# Patient Record
Sex: Female | Born: 1965 | Race: White | Hispanic: No | Marital: Married | Smoking: Never smoker
Health system: Southern US, Community
[De-identification: ages and names within clinical notes are randomized; demographics above are authoritative.]

## PROBLEM LIST (undated history)

## (undated) DIAGNOSIS — E8881 Metabolic syndrome: Secondary | ICD-10-CM

## (undated) DIAGNOSIS — Z9889 Other specified postprocedural states: Secondary | ICD-10-CM

## (undated) DIAGNOSIS — G4730 Sleep apnea, unspecified: Secondary | ICD-10-CM

## (undated) DIAGNOSIS — R112 Nausea with vomiting, unspecified: Secondary | ICD-10-CM

## (undated) DIAGNOSIS — M549 Dorsalgia, unspecified: Secondary | ICD-10-CM

## (undated) DIAGNOSIS — M25561 Pain in right knee: Secondary | ICD-10-CM

## (undated) DIAGNOSIS — M722 Plantar fascial fibromatosis: Secondary | ICD-10-CM

## (undated) DIAGNOSIS — F419 Anxiety disorder, unspecified: Secondary | ICD-10-CM

## (undated) DIAGNOSIS — J329 Chronic sinusitis, unspecified: Secondary | ICD-10-CM

## (undated) DIAGNOSIS — M2550 Pain in unspecified joint: Secondary | ICD-10-CM

## (undated) DIAGNOSIS — K219 Gastro-esophageal reflux disease without esophagitis: Secondary | ICD-10-CM

## (undated) DIAGNOSIS — E88819 Insulin resistance, unspecified: Secondary | ICD-10-CM

## (undated) DIAGNOSIS — E559 Vitamin D deficiency, unspecified: Secondary | ICD-10-CM

## (undated) DIAGNOSIS — T7840XA Allergy, unspecified, initial encounter: Secondary | ICD-10-CM

## (undated) DIAGNOSIS — D219 Benign neoplasm of connective and other soft tissue, unspecified: Secondary | ICD-10-CM

## (undated) DIAGNOSIS — R0602 Shortness of breath: Secondary | ICD-10-CM

## (undated) DIAGNOSIS — E785 Hyperlipidemia, unspecified: Secondary | ICD-10-CM

## (undated) HISTORY — DX: Allergy, unspecified, initial encounter: T78.40XA

## (undated) HISTORY — DX: Shortness of breath: R06.02

## (undated) HISTORY — DX: Pain in right knee: M25.561

## (undated) HISTORY — DX: Pain in unspecified joint: M25.50

## (undated) HISTORY — DX: Chronic sinusitis, unspecified: J32.9

## (undated) HISTORY — DX: Gastro-esophageal reflux disease without esophagitis: K21.9

## (undated) HISTORY — PX: GANGLION CYST EXCISION: SHX1691

## (undated) HISTORY — DX: Anxiety disorder, unspecified: F41.9

## (undated) HISTORY — DX: Hyperlipidemia, unspecified: E78.5

## (undated) HISTORY — DX: Sleep apnea, unspecified: G47.30

## (undated) HISTORY — PX: NASAL SINUS SURGERY: SHX719

## (undated) HISTORY — DX: Vitamin D deficiency, unspecified: E55.9

## (undated) HISTORY — PX: TMJ ARTHROSCOPY: SHX1067

## (undated) HISTORY — DX: Dorsalgia, unspecified: M54.9

## (undated) HISTORY — PX: NASAL SEPTUM SURGERY: SHX37

---

## 2000-02-18 ENCOUNTER — Other Ambulatory Visit: Admission: RE | Admit: 2000-02-18 | Discharge: 2000-02-18 | Payer: Self-pay | Admitting: Gynecology

## 2001-02-24 ENCOUNTER — Other Ambulatory Visit: Admission: RE | Admit: 2001-02-24 | Discharge: 2001-02-24 | Payer: Self-pay | Admitting: Gynecology

## 2001-03-29 ENCOUNTER — Emergency Department (HOSPITAL_COMMUNITY): Admission: EM | Admit: 2001-03-29 | Discharge: 2001-03-29 | Payer: Self-pay | Admitting: Emergency Medicine

## 2001-12-19 ENCOUNTER — Emergency Department (HOSPITAL_COMMUNITY): Admission: EM | Admit: 2001-12-19 | Discharge: 2001-12-19 | Payer: Self-pay

## 2002-02-25 ENCOUNTER — Other Ambulatory Visit: Admission: RE | Admit: 2002-02-25 | Discharge: 2002-02-25 | Payer: Self-pay | Admitting: Gynecology

## 2003-03-24 ENCOUNTER — Other Ambulatory Visit: Admission: RE | Admit: 2003-03-24 | Discharge: 2003-03-24 | Payer: Self-pay | Admitting: Gynecology

## 2004-04-30 ENCOUNTER — Other Ambulatory Visit: Admission: RE | Admit: 2004-04-30 | Discharge: 2004-04-30 | Payer: Self-pay | Admitting: Gynecology

## 2005-03-20 ENCOUNTER — Other Ambulatory Visit: Admission: RE | Admit: 2005-03-20 | Discharge: 2005-03-20 | Payer: Self-pay | Admitting: Gynecology

## 2006-12-22 ENCOUNTER — Other Ambulatory Visit: Admission: RE | Admit: 2006-12-22 | Discharge: 2006-12-22 | Payer: Self-pay | Admitting: Gynecology

## 2008-11-08 ENCOUNTER — Encounter: Payer: Self-pay | Admitting: Family Medicine

## 2008-11-09 ENCOUNTER — Encounter: Payer: Self-pay | Admitting: Family Medicine

## 2008-11-09 ENCOUNTER — Encounter (INDEPENDENT_AMBULATORY_CARE_PROVIDER_SITE_OTHER): Payer: Self-pay | Admitting: *Deleted

## 2008-11-09 LAB — CONVERTED CEMR LAB
ALT: 13 units/L
Alkaline Phosphatase: 75 units/L
CO2, serum: 19 mmol/L
Chloride, Serum: 105 mmol/L
Cholesterol: 151 mg/dL
Creatinine, Ser: 0.64 mg/dL
Free T4: 0.85 ng/dL
Hemoglobin: 14 g/dL
Potassium, serum: 4.3 mmol/L
Sodium, serum: 138 mmol/L
TSH: 1.536 microintl units/mL
Total Bilirubin: 0.4 mg/dL
Total Protein: 7.2 g/dL
Triglycerides: 102 mg/dL

## 2008-11-25 ENCOUNTER — Encounter: Payer: Self-pay | Admitting: Family Medicine

## 2008-12-05 ENCOUNTER — Ambulatory Visit: Payer: Self-pay | Admitting: Family Medicine

## 2008-12-05 DIAGNOSIS — R109 Unspecified abdominal pain: Secondary | ICD-10-CM | POA: Insufficient documentation

## 2008-12-21 ENCOUNTER — Encounter (INDEPENDENT_AMBULATORY_CARE_PROVIDER_SITE_OTHER): Payer: Self-pay | Admitting: *Deleted

## 2009-01-30 ENCOUNTER — Ambulatory Visit: Payer: Self-pay | Admitting: Family Medicine

## 2009-01-30 DIAGNOSIS — J019 Acute sinusitis, unspecified: Secondary | ICD-10-CM | POA: Insufficient documentation

## 2009-02-16 ENCOUNTER — Ambulatory Visit: Payer: Self-pay | Admitting: Internal Medicine

## 2009-02-20 HISTORY — PX: COLONOSCOPY: SHX174

## 2009-03-02 ENCOUNTER — Ambulatory Visit: Payer: Self-pay | Admitting: Internal Medicine

## 2009-04-03 ENCOUNTER — Telehealth (INDEPENDENT_AMBULATORY_CARE_PROVIDER_SITE_OTHER): Payer: Self-pay | Admitting: *Deleted

## 2009-10-02 ENCOUNTER — Telehealth (INDEPENDENT_AMBULATORY_CARE_PROVIDER_SITE_OTHER): Payer: Self-pay | Admitting: *Deleted

## 2009-11-13 ENCOUNTER — Encounter: Payer: Self-pay | Admitting: Internal Medicine

## 2009-11-13 ENCOUNTER — Encounter (INDEPENDENT_AMBULATORY_CARE_PROVIDER_SITE_OTHER): Payer: Self-pay | Admitting: *Deleted

## 2009-11-13 LAB — CONVERTED CEMR LAB
Albumin: 3.9 g/dL
Albumin: 3.9 g/dL
Alkaline Phosphatase: 73 units/L
BUN: 13 mg/dL
BUN: 13 mg/dL
Calcium: 8.7 mg/dL
Chloride, Serum: 106 mmol/L
Chloride, Serum: 106 mmol/L
Creatinine, Ser: 0.65 mg/dL
Glucose, Bld: 86 mg/dL
HCT: 42.1 %
HDL: 46 mg/dL
Hemoglobin: 13.3 g/dL
LDL Cholesterol: 90 mg/dL
MCH: 31.6 pg
MCV: 93.8 fL
Potassium, serum: 4.3 mmol/L
Potassium, serum: 4.3 mmol/L
Sodium, serum: 139 mmol/L
TSH: 1.047 microintl units/mL
TSH: 1.047 microintl units/mL
Total Bilirubin: 0.2 mg/dL
Total Protein: 6.7 g/dL
WBC, blood: 7.4 10*3/uL
platelet count: 381 10*3/uL

## 2009-11-20 ENCOUNTER — Encounter (INDEPENDENT_AMBULATORY_CARE_PROVIDER_SITE_OTHER): Payer: Self-pay | Admitting: *Deleted

## 2009-11-29 ENCOUNTER — Ambulatory Visit: Payer: Self-pay | Admitting: Family

## 2009-11-29 ENCOUNTER — Ambulatory Visit (HOSPITAL_COMMUNITY): Admission: RE | Admit: 2009-11-29 | Discharge: 2009-11-29 | Payer: Self-pay | Admitting: Family Medicine

## 2009-11-29 DIAGNOSIS — M545 Low back pain: Secondary | ICD-10-CM

## 2009-11-29 DIAGNOSIS — G8929 Other chronic pain: Secondary | ICD-10-CM | POA: Insufficient documentation

## 2009-11-30 ENCOUNTER — Telehealth (INDEPENDENT_AMBULATORY_CARE_PROVIDER_SITE_OTHER): Payer: Self-pay | Admitting: *Deleted

## 2009-11-30 ENCOUNTER — Ambulatory Visit: Payer: Self-pay | Admitting: Family

## 2009-12-01 LAB — CONVERTED CEMR LAB: Rhuematoid fact SerPl-aCnc: 21.5 intl units/mL — ABNORMAL HIGH (ref 0.0–20.0)

## 2009-12-27 ENCOUNTER — Encounter: Admission: RE | Admit: 2009-12-27 | Discharge: 2010-01-16 | Payer: Self-pay | Admitting: Family Medicine

## 2010-01-10 ENCOUNTER — Encounter: Payer: Self-pay | Admitting: Family

## 2010-01-10 ENCOUNTER — Encounter: Payer: Self-pay | Admitting: Family Medicine

## 2010-03-29 ENCOUNTER — Telehealth (INDEPENDENT_AMBULATORY_CARE_PROVIDER_SITE_OTHER): Payer: Self-pay | Admitting: *Deleted

## 2010-04-19 ENCOUNTER — Ambulatory Visit (HOSPITAL_COMMUNITY): Admission: RE | Admit: 2010-04-19 | Discharge: 2010-04-19 | Payer: Self-pay | Admitting: Oncology

## 2010-04-19 ENCOUNTER — Ambulatory Visit: Payer: Self-pay | Admitting: Oncology

## 2010-05-01 ENCOUNTER — Ambulatory Visit: Payer: Self-pay | Admitting: Family Medicine

## 2010-07-12 ENCOUNTER — Encounter: Admission: RE | Admit: 2010-07-12 | Discharge: 2010-08-07 | Payer: Self-pay | Admitting: Oncology

## 2010-12-23 HISTORY — PX: ESOPHAGOGASTRODUODENOSCOPY: SHX1529

## 2010-12-26 ENCOUNTER — Telehealth (INDEPENDENT_AMBULATORY_CARE_PROVIDER_SITE_OTHER): Payer: Self-pay | Admitting: *Deleted

## 2011-01-22 NOTE — Progress Notes (Signed)
Summary: nexium   Phone Note Refill Request Message from:  Pharmacy on [facility] Fax #: [phone]  Refills Requested: Medication #1:  NEXIUM 20 MG CPDR 1 tab by mouth daily   Dosage confirmed as above?Dosage Confirmed   Supply Requested: 3 months   Last Refilled: 01/02/2010 Next Appointment Scheduled: none Initial call taken by: Harold Barban,  March 29, 2010 9:26 AM    Prescriptions: NEXIUM 20 MG CPDR (ESOMEPRAZOLE MAGNESIUM) 1 tab by mouth daily  #90 x 0   Entered by:   Doristine Devoid   Authorized by:   Neena Rhymes MD   Signed by:   Doristine Devoid on 03/29/2010   Method used:   Electronically to        [facility]* (retail)       [address]       [address] Shipping/mailing       [place], [place]  [address]       Ph: [phone]       Fax: [phone]   RxID:   [id number]

## 2011-01-22 NOTE — Miscellaneous (Signed)
Summary: PT Initial Summary/[facility]  PT Initial Summary/[facility]   Imported By: Lanelle Bal 01/19/2010 11:39:02  _____________________________________________________________________  External Attachment:    Type:   Image     Comment:   External Document

## 2011-01-22 NOTE — Assessment & Plan Note (Signed)
Summary: fu on meds/kdc   Vital Signs:  Patient profile:   45 year old female Menstrual status:  regular Weight:      201 pounds Pulse rate:   84 / minute BP sitting:   114 / 80  (left arm)  Vitals Entered By: Doristine Devoid (May 01, 2010 9:22 AM) CC: CPE   History of Present Illness: 45 yo woman here today for CPE.  no concerns.  Preventive Screening-Counseling & Management  Alcohol-Tobacco     Alcohol drinks/day: <1     Smoking Status: never  Caffeine-Diet-Exercise     Does Patient Exercise: yes     Type of exercise: swimming      Drug Use:  never.    Problems Prior to Update: 1)  Back Pain, Lumbar  (ICD-724.2) 2)  Special Screening For Malignant Neoplasms Colon  (ICD-V76.51) 3)  Healthy Adult Female  (ICD-V70.0) 4)  Sinusitis- Acute-nos  (ICD-461.9) 5)  Inguinal Pain, Left  (ICD-789.09) 6)  Abdominal Pain  (ICD-789.00)  Current Medications (verified): 1)  Ortho-Novum 7/7/7 (28) 0.5/0.75/1-35 Mg-Mcg Tabs (Norethin-Eth Estrad Triphasic) .... Take One Tablet Daily 2)  Nexium 20 Mg Cpdr (Esomeprazole Magnesium) .Marland Kitchen.. 1 Tab By Mouth Daily 3)  Dicyclomine Hcl 10 Mg Caps (Dicyclomine Hcl) .Marland Kitchen.. 1 Tab By Mouth Three Times A Day As Needed For Abd Pain and Spasm 4)  Asa  81 Mg .Marland Kitchen.. 1 By Mouth Once Daily 5)  Calcium 1200 With Vit D .... 1 By Mouth Once Daily 6)  Fluticasone Propionate 50 Mcg/act  Susp (Fluticasone Propionate) .... 2 Sprays Each Nostril Once Daily 7)  Vitamin B-6 50 Mg Tabs (Pyridoxine Hcl) .Marland Kitchen.. 1 Tab By Mouth Daily 8)  Claritin 10 Mg Tabs (Loratadine) .Marland Kitchen.. 1 Tab By Mouth Daily 9)  Mobic 7.5 Mg Tabs (Meloxicam) .... One Tablet By Mouth Daily 10)  Robaxin 500 Mg Tabs (Methocarbamol) .... One Tablet By Mouth Every 6 Hours As Needed For Muscle Spasm 11)  Fish Oil 1000 Mg Caps (Omega-3 Fatty Acids) .... Take One Tablet Two Times A Day 12)  Vitamin C 500 Mg Tabs (Ascorbic Acid) .... Take One Tablet Two Times A Day  Allergies (verified): 1)  ! Percocet 2)  !  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  no tobacco, no ETOH  Social History: Does Patient Exercise:  yes Drug Use:  never  Review of Systems  The patient denies anorexia, fever, weight loss, weight gain, vision loss, decreased hearing, hoarseness, chest pain, syncope, dyspnea on exertion, peripheral edema, prolonged cough, headaches, abdominal pain, melena, hematochezia, severe indigestion/heartburn, hematuria, suspicious skin lesions, depression, abnormal bleeding, enlarged lymph nodes, and breast masses.    Physical Exam  General:  Well-developed,well-nourished,in no acute distress; alert,appropriate and cooperative throughout examination Head:  Normocephalic and atraumatic without obvious abnormalities. No apparent alopecia or balding. Eyes:  No corneal or conjunctival inflammation noted. EOMI. Perrla. Funduscopic exam benign, without hemorrhages, exudates or papilledema. Vision grossly normal. Ears:  External ear exam shows no significant lesions or deformities.  Otoscopic examination reveals clear canals, tympanic membranes are intact bilaterally without bulging, retraction, inflammation or discharge. Hearing is grossly normal bilaterally. Nose:  External nasal examination shows no deformity or inflammation. Nasal mucosa are pink  and moist without lesions or exudates. Mouth:  Oral mucosa and oropharynx without lesions or exudates.  Teeth in good repair. Neck:  No deformities, masses, or tenderness noted. Breasts:  deferred to GYN Lungs:  Normal respiratory effort, chest expands symmetrically.  Lungs are clear to auscultation, no crackles or wheezes. Heart:  Normal rate and regular rhythm. S1 and S2 normal without gallop, murmur, click, rub or other extra sounds. Abdomen:  Bowel sounds positive,abdomen soft and non-tender without masses, organomegaly or hernias noted. Genitalia:  deferred to gyn Msk:  No deformity or scoliosis noted of thoracic or lumbar spine.   Pulses:  +2 carotid, radial, DP Extremities:  No clubbing, cyanosis, edema, or deformity noted with normal full range of motion of all joints.   Neurologic:  No cranial nerve deficits noted. Station and gait are normal. Plantar reflexes are down-going bilaterally. DTRs are symmetrical throughout. Sensory, motor and coordinative functions appear intact. Skin:  Intact without suspicious lesions or rashes Cervical Nodes:  No lymphadenopathy noted Inguinal Nodes:  No significant adenopathy Psych:  Cognition and judgment appear intact. Alert and cooperative with normal attention span and concentration. No apparent delusions, illusions, hallucinations   Impression & Recommendations:  Problem # 1:  HEALTHY ADULT FEMALE (ICD-V70.0) Assessment Unchanged labs done at GYN in Nov.  reviewed- look good.  UTD on health maintainence.  PE WNL.  encouraged healthy diet and regular exercise.  Complete Medication List: 1)  Ortho-novum 7/7/7 (28) 0.5/0.75/1-35 Mg-mcg Tabs (Norethin-eth estrad triphasic) .... Take one tablet daily 2)  Nexium 20 Mg Cpdr (Esomeprazole magnesium) .Marland Kitchen.. 1 tab by mouth daily 3)  Dicyclomine Hcl 10 Mg Caps (Dicyclomine hcl) .Marland Kitchen.. 1 tab by mouth three times a day as needed for abd pain and spasm 4)  Asa 81 Mg  .Marland KitchenMarland Kitchen. 1 by mouth once daily 5)  Calcium 1200 With Vit D  .... 1 by mouth once daily 6)  Fluticasone Propionate 50 Mcg/act Susp (Fluticasone propionate) .... 2 sprays each nostril once daily 7)  Vitamin B-6 50 Mg Tabs (Pyridoxine hcl) .Marland Kitchen.. 1 tab by mouth daily 8)  Claritin 10 Mg Tabs (Loratadine) .Marland Kitchen.. 1 tab by  mouth daily 9)  Mobic 7.5 Mg Tabs (Meloxicam) .... One tablet by mouth daily 10)  Robaxin 500 Mg Tabs (Methocarbamol) .... One tablet by mouth every 6 hours as needed for muscle spasm 11)  Fish Oil 1000 Mg Caps (Omega-3 fatty acids) .... Take one tablet two times a day 12)  Vitamin C 500 Mg Tabs (Ascorbic acid) .... Take one tablet two times a day  Patient Instructions: 1)  Please schedule a follow-up appointment in 1 year or as you need me. 2)  Your labs from November looked great!  Keep up the good work! 3)  Keep working on the exercise- you'll get there! 4)  Your exam looks fantastic! 5)  HAPPY BIRTHDAY!!!!

## 2011-01-24 NOTE — Progress Notes (Signed)
Summary: refill  Phone Note Refill Request Call back at Home Phone [phone]   Refills Requested: Medication #1:  NEXIUM 20 MG CPDR 1 tab by mouth daily   Supply Requested: 1 year [facility].............Marland KitchenFelecia Deloach CMA  December 26, 2010 10:32 AM      Prescriptions: NEXIUM 20 MG CPDR (ESOMEPRAZOLE MAGNESIUM) 1 tab by mouth daily  #90 Capsule x 1   Entered by:   Doristine Devoid CMA   Authorized by:   Neena Rhymes MD   Signed by:   Doristine Devoid CMA on 12/26/2010   Method used:   Electronically to        [facility]* (retail)       [address]       [address] Shipping/mailing       [place], [place]  [address]       Ph: [phone]       Fax: [phone]   RxID:   [id number]

## 2011-01-25 NOTE — Miscellaneous (Signed)
Summary: PT Discharge/[facility]  PT Discharge/[facility]   Imported By: Lanelle Bal 01/17/2010 13:22:20  _____________________________________________________________________  External Attachment:    Type:   Image     Comment:   External Document

## 2011-04-19 ENCOUNTER — Encounter: Payer: Self-pay | Admitting: Family Medicine

## 2011-05-03 ENCOUNTER — Encounter: Payer: Self-pay | Admitting: Family Medicine

## 2011-05-16 ENCOUNTER — Encounter: Payer: Self-pay | Admitting: Family Medicine

## 2011-06-11 ENCOUNTER — Other Ambulatory Visit: Payer: Self-pay | Admitting: Family Medicine

## 2011-06-11 NOTE — Telephone Encounter (Signed)
Refill sent.

## 2011-06-17 ENCOUNTER — Ambulatory Visit (INDEPENDENT_AMBULATORY_CARE_PROVIDER_SITE_OTHER): Payer: 59 | Admitting: Family Medicine

## 2011-06-17 ENCOUNTER — Encounter: Payer: Self-pay | Admitting: Family Medicine

## 2011-06-17 ENCOUNTER — Encounter: Payer: Self-pay | Admitting: *Deleted

## 2011-06-17 DIAGNOSIS — R140 Abdominal distension (gaseous): Secondary | ICD-10-CM

## 2011-06-17 DIAGNOSIS — R141 Gas pain: Secondary | ICD-10-CM

## 2011-06-17 DIAGNOSIS — Z0000 Encounter for general adult medical examination without abnormal findings: Secondary | ICD-10-CM | POA: Insufficient documentation

## 2011-06-17 DIAGNOSIS — R142 Eructation: Secondary | ICD-10-CM

## 2011-06-17 NOTE — Progress Notes (Signed)
  Subjective:    Patient ID: Hailey Noble, female    DOB: 1966/01/25, 45 y.o.   MRN: [id number]  HPI CPE- UTD on pap and mammo.  Had labs done w/ Dr Johnn Hai office in November- will attempt to get records.  Has area of discoloration on L breast.  No lump, pain.  Started as red spot, now tan in color.  GI issues- very gassy, bloated.  Having a lot of acidic stools.  + nausea.  On Nexium.  Dr Marina [facility] is GI.   Review of Systems Patient reports no vision/ hearing changes, adenopathy,fever, weight change,  persistant/recurrent hoarseness , swallowing issues, chest pain, palpitations, edema, persistant/recurrent cough, hemoptysis, dyspnea (rest/exertional/paroxysmal nocturnal), gastrointestinal bleeding (melena, rectal bleeding), GU symptoms (dysuria, hematuria, incontinence), Gyn symptoms (abnormal  bleeding, pain),  syncope, focal weakness, memory loss, numbness & tingling, skin/hair/nail changes, abnormal bruising or bleeding, anxiety, or depression.     Objective:   Physical Exam  General Appearance:    Alert, cooperative, no distress, appears stated age, overwt  Head:    Normocephalic, without obvious abnormality, atraumatic  Eyes:    PERRL, conjunctiva/corneas clear, EOM's intact, fundi    benign, both eyes  Ears:    Normal TM's and external ear canals, both ears  Nose:   Nares normal, septum midline, mucosa normal, no drainage    or sinus tenderness  Throat:   Lips, mucosa, and tongue normal; teeth and gums normal  Neck:   Supple, symmetrical, trachea midline, no adenopathy;    Thyroid: no enlargement/tenderness/nodules  Back:     Symmetric, no curvature, ROM normal, no CVA tenderness  Lungs:     Clear to auscultation bilaterally, respirations unlabored  Chest Wall:    No tenderness or deformity   Heart:    Regular rate and rhythm, S1 and S2 normal, no murmur, rub   or gallop  Breast Exam:    No tenderness, masses, or nipple abnormality- healing bruise on L breast (this was pt's area of  concern)  Abdomen:     Soft, non-tender, bowel sounds active all four quadrants,    no masses, no organomegaly  Genitalia:    Deferred to GYN  Rectal:    Deferred to GYN  Extremities:   Extremities normal, atraumatic, no cyanosis or edema  Pulses:   2+ and symmetric all extremities  Skin:   Skin color, texture, turgor normal, no rashes or lesions  Lymph nodes:   Cervical, supraclavicular, and axillary nodes normal  Neurologic:   CNII-XII intact, normal strength, sensation and reflexes    throughout          Assessment & Plan:

## 2011-06-17 NOTE — Patient Instructions (Signed)
Follow up in 1 year or as needed We'll put in the referral to GI for possible endoscopy The area of concern on the breast appears to be a healing bruise- let me know if there are any changes You look great!  Keep up the good work!!! Call with any questions or concerns Have a great summer!!!

## 2011-06-28 ENCOUNTER — Encounter: Payer: Self-pay | Admitting: Family Medicine

## 2011-06-28 NOTE — Assessment & Plan Note (Signed)
Check H pylori- refer to GI for planned endoscopy.

## 2011-06-28 NOTE — Assessment & Plan Note (Signed)
Pt's PE WNL.  Will attempt to get lab results from GYN.  Anticipatory guidance provided.

## 2011-08-07 ENCOUNTER — Ambulatory Visit: Payer: 59 | Admitting: Internal Medicine

## 2011-09-02 ENCOUNTER — Ambulatory Visit: Payer: 59 | Admitting: Internal Medicine

## 2011-09-03 ENCOUNTER — Ambulatory Visit (INDEPENDENT_AMBULATORY_CARE_PROVIDER_SITE_OTHER): Payer: 59 | Admitting: Internal Medicine

## 2011-09-03 ENCOUNTER — Encounter: Payer: Self-pay | Admitting: Internal Medicine

## 2011-09-03 VITALS — BP 130/74 | HR 100 | Ht 67.0 in | Wt 205.6 lb

## 2011-09-03 DIAGNOSIS — K219 Gastro-esophageal reflux disease without esophagitis: Secondary | ICD-10-CM

## 2011-09-03 DIAGNOSIS — R1013 Epigastric pain: Secondary | ICD-10-CM

## 2011-09-03 NOTE — Progress Notes (Signed)
HISTORY OF PRESENT ILLNESS:  Hailey Noble is a 45 y.o. female seen previously for screening colonoscopy, in March of 2010, which was normal. She presents today for evaluation of chronic mid abdominal burning discomfort. She states that symptom has been present for approximately 6-9 months. It is generally brought on by certain foods, such as spicy foods, or alcohol. It generally lasts about 5 minutes and occurs once per week. It is relieved with cold water. The discomfort is nonradiating and not severe. 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 She takes NSAIDs approximately 3 times per month. Review of outside records finds that H. pylori testing was negative. She does have a history of GERD with classic symptoms for which she has been on Nexium for about 1 year. This helps classic GERD symptoms. No dysphagia. No weight loss. No GI bleeding.  REVIEW OF SYSTEMS:  All non-GI ROS negative except for sinus and allergy trouble  Past Medical History  Diagnosis Date  . GERD (gastroesophageal reflux disease)   . Sinusitis     Past Surgical History  Procedure Date  . Nasal sinus surgery   . Nasal septum surgery   . Ganglion cyst excision     right  . Tmj arthroscopy     left    Social History Hailey Noble  reports that she has never smoked. She has never used smokeless tobacco. She reports that she does not drink alcohol or use illicit drugs.  family history includes Breast cancer (age of onset:56) in her mother; Colon cancer in her paternal grandfather; Diabetes in her father; and Hypertension in her father.  Allergies  Allergen Reactions  . Oxycodone-Acetaminophen     REACTION: rash on face and head, itching  . Tussionex Pennkinetic Er     REACTION: rash on face and head, itching       PHYSICAL EXAMINATION: Vital signs: BP 130/74  Pulse 100  Ht 5\' 7"  (1.702 m)  Wt 205 lb 9.6 oz (93.26 kg)  BMI 32.20 kg/m2    Constitutional: Obese but generally well-appearing, no acute distress Psychiatric: alert and oriented x3, cooperative Eyes: extraocular movements intact, anicteric, conjunctiva pink Mouth: oral pharynx moist, no lesions Neck: supple no lymphadenopathy Cardiovascular: heart regular rate and rhythm, no murmur Lungs: clear to auscultation bilaterally Abdomen: Obese, soft, nontender, nondistended, no obvious ascites, no peritoneal signs, normal bowel sounds, no organomegaly Extremities: no lower extremity edema bilaterally Skin: no lesions on visible extremities Neuro: No focal deficits.    ASSESSMENT:  #1. Vague mid abdominal burning discomfort as described. No worrisome features. This symptom persists despite PPI therapy #2. GERD. Classic symptoms controlled with Nexium    PLAN:  #1. Diagnostic upper endoscopy.The nature of the procedure, as well as the risks, benefits, and alternatives were carefully and thoroughly reviewed with the patient. Ample time for discussion and questions allowed. The patient understood, was satisfied, and agreed to proceed.  #2. Reflux precautions #3. PPI to control GERD

## 2011-09-03 NOTE — Patient Instructions (Signed)
EGD [facility] 10/11/11 1:30 pm EGD Brochure given for you to read.

## 2011-09-24 ENCOUNTER — Other Ambulatory Visit: Payer: Self-pay | Admitting: Family Medicine

## 2011-09-24 MED ORDER — ESOMEPRAZOLE MAGNESIUM 20 MG PO CPDR: 20.0000 mg | DELAYED_RELEASE_CAPSULE | Freq: Every day | ORAL | Status: DC

## 2011-09-24 NOTE — Telephone Encounter (Signed)
Done

## 2011-10-11 ENCOUNTER — Ambulatory Visit (AMBULATORY_SURGERY_CENTER): Payer: 59 | Admitting: Internal Medicine

## 2011-10-11 ENCOUNTER — Encounter: Payer: Self-pay | Admitting: Internal Medicine

## 2011-10-11 VITALS — BP 124/57 | HR 83 | Temp 97.1°F | Resp 19 | Ht 67.0 in | Wt 205.0 lb

## 2011-10-11 DIAGNOSIS — R1013 Epigastric pain: Secondary | ICD-10-CM

## 2011-10-11 DIAGNOSIS — K219 Gastro-esophageal reflux disease without esophagitis: Secondary | ICD-10-CM

## 2011-10-11 MED ORDER — SODIUM CHLORIDE 0.9 % IV SOLN: 500.0000 mL | INTRAVENOUS | Status: DC

## 2011-10-11 NOTE — Patient Instructions (Signed)
Discharge instructions given with verbal understanding.  Handouts on GERD and a high fiber diet. Given.  Resume previous medications.

## 2011-10-14 ENCOUNTER — Telehealth: Payer: Self-pay | Admitting: *Deleted

## 2011-10-14 NOTE — Telephone Encounter (Signed)
No answer at home number.  No ID and no message left.

## 2011-12-11 ENCOUNTER — Encounter: Payer: Self-pay | Admitting: Family Medicine

## 2011-12-12 ENCOUNTER — Encounter: Payer: Self-pay | Admitting: Family Medicine

## 2011-12-12 ENCOUNTER — Ambulatory Visit (INDEPENDENT_AMBULATORY_CARE_PROVIDER_SITE_OTHER): Payer: 59 | Admitting: Family Medicine

## 2011-12-12 VITALS — BP 130/80 | HR 90 | Temp 98.3°F | Ht 66.0 in | Wt 206.6 lb

## 2011-12-12 DIAGNOSIS — L259 Unspecified contact dermatitis, unspecified cause: Secondary | ICD-10-CM | POA: Insufficient documentation

## 2011-12-12 MED ORDER — PREDNISONE 20 MG PO TABS: ORAL_TABLET | ORAL | Status: DC

## 2011-12-12 NOTE — Assessment & Plan Note (Signed)
Pt's rash consistent w/ contact dermatitis.  Given diffuse distribution will start systemic steroids.  Reviewed supportive care and red flags that should prompt return.  Pt expressed understanding and is in agreement w/ plan.

## 2011-12-12 NOTE — Progress Notes (Signed)
  Subjective:    Patient ID: Hailey Noble, female    DOB: 06/09/1966, 45 y.o.   MRN: [id number]  HPI Rash- was mowing yard and bagging leaves on Thursday.  Monday developed rash on abdomen, hand, neck.  Very itchy, using benadryl, clobetasol 0.05% cream.     Review of Systems For ROS see HPI     Objective:   Physical Exam  Vitals reviewed. Constitutional: She appears well-developed and well-nourished.  Skin: Skin is warm and dry. Rash (erythematous, vesicular rash consistent w/ contact dermatitis) noted.          Assessment & Plan:

## 2011-12-12 NOTE — Patient Instructions (Signed)
This is a contact dermatitis- possibly poison ivy Start the prednisone- 2 tabs daily x10 days (take w/ food) Benadryl as needed Call with any questions or concerns Happy Holidays!!!

## 2011-12-20 ENCOUNTER — Other Ambulatory Visit: Payer: Self-pay | Admitting: Family Medicine

## 2011-12-20 ENCOUNTER — Telehealth: Payer: Self-pay | Admitting: Family Medicine

## 2011-12-20 MED ORDER — PREDNISONE 20 MG PO TABS: ORAL_TABLET | ORAL | Status: DC

## 2011-12-20 MED ORDER — ESOMEPRAZOLE MAGNESIUM 20 MG PO CPDR: 20.0000 mg | DELAYED_RELEASE_CAPSULE | Freq: Every day | ORAL | Status: DC

## 2011-12-20 NOTE — Telephone Encounter (Signed)
Last OV 12-12-11

## 2011-12-20 NOTE — Telephone Encounter (Signed)
Spoke to pt, she advised that DOES NOT have any spots in between toes, advised that a new rx will be sent for prednisone 20mg  tabs- take 2 tabs daily x10 days- #40

## 2011-12-20 NOTE — Telephone Encounter (Signed)
.  left message to have patient return my call. On both home phone and mobile

## 2011-12-20 NOTE — Telephone Encounter (Signed)
If she is having sxs between her toes it may be scabies.  Can have 20mg  tabs- take 2 tabs daily x10 days- #40.  If having scabies sxs will need additional med sent.

## 2011-12-20 NOTE — Telephone Encounter (Signed)
Agree w/ prednisone- no elimite at this time.

## 2011-12-20 NOTE — Telephone Encounter (Signed)
rx sent to pharmacy by e-script  

## 2011-12-20 NOTE — Telephone Encounter (Signed)
Patient states that poison ivy is now on her inner thighs, ankles, and feet and would like more prednisone called into [facility].

## 2012-02-04 ENCOUNTER — Other Ambulatory Visit: Payer: Self-pay

## 2012-02-04 ENCOUNTER — Encounter (HOSPITAL_COMMUNITY): Payer: Self-pay | Admitting: *Deleted

## 2012-02-04 ENCOUNTER — Emergency Department (HOSPITAL_COMMUNITY)
Admission: EM | Admit: 2012-02-04 | Discharge: 2012-02-04 | Disposition: A | Discharge status: Home / Self Care | Payer: 59 | Attending: Emergency Medicine | Admitting: Emergency Medicine

## 2012-02-04 DIAGNOSIS — R110 Nausea: Secondary | ICD-10-CM | POA: Insufficient documentation

## 2012-02-04 DIAGNOSIS — M549 Dorsalgia, unspecified: Secondary | ICD-10-CM | POA: Insufficient documentation

## 2012-02-04 DIAGNOSIS — K219 Gastro-esophageal reflux disease without esophagitis: Secondary | ICD-10-CM | POA: Insufficient documentation

## 2012-02-04 DIAGNOSIS — Z79899 Other long term (current) drug therapy: Secondary | ICD-10-CM | POA: Insufficient documentation

## 2012-02-04 HISTORY — DX: Plantar fascial fibromatosis: M72.2

## 2012-02-04 LAB — CBC
HCT: 39.7 % (ref 36.0–46.0)
Hemoglobin: 13.2 g/dL (ref 12.0–15.0)
MCH: 29 pg (ref 26.0–34.0)
MCHC: 33.2 g/dL (ref 30.0–36.0)
MCV: 87.3 fL (ref 78.0–100.0)
Platelets: 340 10*3/uL (ref 150–400)
RBC: 4.55 MIL/uL (ref 3.87–5.11)
RDW: 12.6 % (ref 11.5–15.5)
WBC: 8.2 10*3/uL (ref 4.0–10.5)

## 2012-02-04 LAB — DIFFERENTIAL
Basophils Absolute: 0 10*3/uL (ref 0.0–0.1)
Basophils Relative: 0 % (ref 0–1)
Eosinophils Absolute: 0.1 10*3/uL (ref 0.0–0.7)
Eosinophils Relative: 1 % (ref 0–5)
Lymphocytes Relative: 27 % (ref 12–46)
Lymphs Abs: 2.3 10*3/uL (ref 0.7–4.0)
Monocytes Absolute: 0.4 10*3/uL (ref 0.1–1.0)
Monocytes Relative: 5 % (ref 3–12)
Neutro Abs: 5.5 10*3/uL (ref 1.7–7.7)
Neutrophils Relative %: 66 % (ref 43–77)

## 2012-02-04 LAB — URINALYSIS, ROUTINE W REFLEX MICROSCOPIC
Bilirubin Urine: NEGATIVE
Glucose, UA: NEGATIVE mg/dL
Hgb urine dipstick: NEGATIVE
Ketones, ur: NEGATIVE mg/dL
Nitrite: NEGATIVE
Protein, ur: NEGATIVE mg/dL
Specific Gravity, Urine: 1.01 (ref 1.005–1.030)
Urobilinogen, UA: 0.2 mg/dL (ref 0.0–1.0)
pH: 6.5 (ref 5.0–8.0)

## 2012-02-04 LAB — BASIC METABOLIC PANEL
BUN: 12 mg/dL (ref 6–23)
CO2: 27 mEq/L (ref 19–32)
Calcium: 9.3 mg/dL (ref 8.4–10.5)
Chloride: 106 mEq/L (ref 96–112)
Creatinine, Ser: 0.53 mg/dL (ref 0.50–1.10)
GFR calc Af Amer: >90 mL/min (ref >90)
GFR calc non Af Amer: >90 mL/min (ref >90)
Glucose, Bld: 77 mg/dL (ref 70–99)
Potassium: 3.8 mEq/L (ref 3.5–5.1)
Sodium: 141 mEq/L (ref 135–145)

## 2012-02-04 LAB — URINE MICROSCOPIC-ADD ON

## 2012-02-04 LAB — PREGNANCY, URINE: Preg Test, Ur: NEGATIVE

## 2012-02-04 MED ORDER — DIPHENHYDRAMINE HCL 50 MG/ML IJ SOLN
INTRAMUSCULAR | Status: AC
  Filled 2012-02-04: qty 1

## 2012-02-04 MED ORDER — ONDANSETRON HCL 4 MG/2ML IJ SOLN
4.0000 mg | Freq: Once | INTRAMUSCULAR | Status: DC
  Filled 2012-02-04: qty 2

## 2012-02-04 MED ORDER — SODIUM CHLORIDE 0.9 % IV BOLUS (SEPSIS)
1000.0000 mL | Freq: Once | INTRAVENOUS | Status: AC
  Administered 2012-02-04: 1000 mL via INTRAVENOUS

## 2012-02-04 NOTE — Discharge Instructions (Signed)
Your testing was normal. Return here for any worsening in your condition.

## 2012-02-04 NOTE — ED Provider Notes (Signed)
History     CSN: [id number]  Arrival date & time 02/04/12  4098   First MD Initiated Contact with Patient 02/04/12 1059      Chief Complaint  Patient presents with  . Back Pain    Left   . Shaking  . Nausea  . Emesis    (Consider location/radiation/quality/duration/timing/severity/associated sxs/prior treatment) HPI Patient reports to the ED this morning complaining of a 1-2 minute episode of shooting chest pain and nausea.  She was helping a friend paint and do things overhead about a week ago and has had a strained muscle near her L scapula for the past week. She reports that when she moves her left arm certain ways it causes a shooting pain around the left side of her rib cage.  She felt this pain this morning along with a "wave of nausea" and chills.  She was encouraged by her coworkers to come to the ED for evaluation. She was a little concerned that she may be having a heart attack because she had a cousin die suddenly of a heart attack at age 55.  She currently denies chest pain, shortness of breath, abdominal pain, pain radiating to her extremities, dizziness, lightheadedness.  She denies fevers, chills, sweats.  She recently had an IUD placed in December and had one bout of bleeding in January, but no discharge or abnormalities since then. She denies any medical conditions, but reports that she takes Nexium as needed. She is allergic to coconut fatty acids, mango flavoring, oxycodone-acetaminophin, and tussionex pennkinetic ER. Past Medical History  Diagnosis Date  . GERD (gastroesophageal reflux disease)   . Sinusitis   . Allergy   . Plantar fasciitis     Past Surgical History  Procedure Date  . Nasal sinus surgery   . Nasal septum surgery   . Ganglion cyst excision     right  . Tmj arthroscopy     left    Family History  Problem Relation Age of Onset  . Hypertension Father   . Diabetes Father   . Colon cancer Paternal Grandfather   . Breast cancer Mother 59  .  Cancer Mother     breast  . Esophageal cancer Neg Hx   . Stomach cancer Neg Hx   . Stroke Neg Hx     History  Substance Use Topics  . Smoking status: Never Smoker   . Smokeless tobacco: Never Used  . Alcohol Use: No    OB History    Grav Para Term Preterm Abortions TAB SAB Ect Mult Living                  Review of Systems All pertinent positives and negatives reviewed in the history of present illness  Allergies  Coconut fatty acids; Mango flavor; Oxycodone-acetaminophen; and Tussionex pennkinetic er  Home Medications   Current Outpatient Rx  Name Route Sig Dispense Refill  . ACETAMINOPHEN ER 650 MG PO TBCR Oral Take 650 mg by mouth every 8 (eight) hours as needed. pain    . VITAMIN C 500 MG PO TABS Oral Take 500 mg by mouth 2 (two) times daily.      . ASPIRIN 81 MG PO TABS Oral Take 81 mg by mouth daily.      Marland Kitchen CALCIUM 1200 PO Oral Take by mouth daily.      Marland Kitchen ESOMEPRAZOLE MAGNESIUM 20 MG PO CPDR Oral Take 1 capsule (20 mg total) by mouth daily before breakfast. 90 capsule 1  .  IBUPROFEN 200 MG PO TABS Oral Take 200 mg by mouth every 6 (six) hours as needed. pain    . LORATADINE 10 MG PO TABS Oral Take 10 mg by mouth daily.      Frazier Butt FREE OP Ophthalmic Apply 1 drop to eye daily.    Marland Kitchen VITAMIN B-6 50 MG PO TABS Oral Take 50 mg by mouth daily.        BP 150/62  Pulse 90  Temp(Src) 98.4 F (36.9 C) (Oral)  Resp 18  SpO2 96%  LMP 11/03/2010  Physical Exam  Constitutional: She is oriented to person, place, and time. She appears well-developed and well-nourished. No distress.  HENT:  Head: Normocephalic and atraumatic.  Eyes: Pupils are equal, round, and reactive to light.  Neck: Normal range of motion. Neck supple.  Cardiovascular: Normal rate, regular rhythm and normal heart sounds.   Pulmonary/Chest: Effort normal and breath sounds normal. No respiratory distress. She has no wheezes. She has no rales. She exhibits no tenderness.  Abdominal: Soft. Bowel  sounds are normal. She exhibits no distension and no mass. There is no tenderness. There is no guarding.  Musculoskeletal:       Right shoulder: Normal.       Left shoulder: She exhibits tenderness (mild tenderness to palpation along the medial border of the L scapula; no palpable deformity).       Arms: Lymphadenopathy:    She has no cervical adenopathy.  Neurological: She is alert and oriented to person, place, and time.  Skin: Skin is dry. She is not diaphoretic.    ED Course  Procedures (including critical care time)   Labs Reviewed  CBC  DIFFERENTIAL  BASIC METABOLIC PANEL  URINALYSIS, ROUTINE W REFLEX MICROSCOPIC  PREGNANCY, URINE   The patient has been stable and feeling well since she got here.  We have rechecked her x3 and she is looks energetic and in normal health sitting here.  Patient has normal lab testing as far and we will have her recheck with her PCP.       MDM  MDM Reviewed: nursing note and vitals Interpretation: labs            Carlyle Dolly, PA-C 02/04/12 1441

## 2012-02-04 NOTE — ED Notes (Signed)
Pt reports left sided back pain with nausea and vomiting this AM and feeling shaky. Pt reports she was working on her home this weekend and thinks back pain may be related, but then started to have nausea and vomiting this AM at work and felt nauseated. Pt denies history of symptoms before. Pt denies chest pain.

## 2012-02-05 NOTE — ED Provider Notes (Signed)
Medical screening examination/treatment/procedure(s) were performed by non-physician practitioner and as supervising physician I was immediately available for consultation/collaboration.   Denver Bentson A. Patrica Duel, MD 02/05/12 [phone]

## 2012-03-10 ENCOUNTER — Other Ambulatory Visit: Payer: Self-pay | Admitting: Family Medicine

## 2012-03-10 NOTE — Telephone Encounter (Signed)
Patient states she worked in yard & has poison oak/ivy. Patient would like for [name] to send in a prescription for  predniSONE (DELTASONE) 20 MG tablet 40 tablet 0 12/20/2011 02/04/2012 Sig: 2 tabs daily x10 days Class: Normal Authorizing Provider: Sheliah Hatch, MD Ordering User: Ovidio Kin, LPN  Also had in December & above was prescribed & worked great To  [facility]  Patient cell# [phone]

## 2012-03-10 NOTE — Telephone Encounter (Signed)
She can have 20 mg bid #10; OVINB

## 2012-03-12 ENCOUNTER — Ambulatory Visit (INDEPENDENT_AMBULATORY_CARE_PROVIDER_SITE_OTHER): Payer: 59 | Admitting: Family Medicine

## 2012-03-12 ENCOUNTER — Encounter: Payer: Self-pay | Admitting: Family Medicine

## 2012-03-12 VITALS — BP 112/74 | HR 89 | Temp 98.6°F | Wt 207.4 lb

## 2012-03-12 DIAGNOSIS — L255 Unspecified contact dermatitis due to plants, except food: Secondary | ICD-10-CM

## 2012-03-12 DIAGNOSIS — L237 Allergic contact dermatitis due to plants, except food: Secondary | ICD-10-CM

## 2012-03-12 MED ORDER — PREDNISONE 20 MG PO TABS: 20.0000 mg | ORAL_TABLET | Freq: Two times a day (BID) | ORAL | Status: AC

## 2012-03-12 NOTE — Telephone Encounter (Signed)
Addended by: Derry Lory A on: 03/12/2012 11:02 AM   Modules accepted: Orders

## 2012-03-12 NOTE — Telephone Encounter (Signed)
rx sent to pharmacy by e-script for prednisone 20mg  BID #10 per MD Hopper, left vm for pt to advise that the medication has been sent to pharmacy, she can call back with any questions or concerns if needed.

## 2012-03-12 NOTE — Progress Notes (Signed)
  Subjective:    Patient ID: Hailey Noble, female    DOB: Jun 17, 1966, 46 y.o.   MRN: [id number]  HPI  Pt did not need appointment --- prednisone was called in already She will call if it gets worse  Review of Systems     Objective:   Physical Exam        Assessment & Plan:

## 2012-06-22 ENCOUNTER — Telehealth: Payer: Self-pay | Admitting: Family Medicine

## 2012-06-22 MED ORDER — ESOMEPRAZOLE MAGNESIUM 20 MG PO CPDR: 20.0000 mg | DELAYED_RELEASE_CAPSULE | Freq: Every day | ORAL | Status: DC

## 2012-06-22 NOTE — Telephone Encounter (Signed)
rx sent to pharmacy by e-script  

## 2012-06-22 NOTE — Telephone Encounter (Signed)
Refill: Nexium 20mg  capsule. Take 1 capsule by mouth daily before breakfast. Qty 90. Last fill 03-18-12

## 2012-09-14 ENCOUNTER — Telehealth: Payer: Self-pay | Admitting: Family Medicine

## 2012-09-14 MED ORDER — ESOMEPRAZOLE MAGNESIUM 20 MG PO CPDR: 20.0000 mg | DELAYED_RELEASE_CAPSULE | Freq: Every day | ORAL | Status: DC

## 2012-09-14 NOTE — Telephone Encounter (Signed)
refill nexium 20mg  cap #90 take 1-capsule by mouth daily before breakfast -- last fill 07.01.13 Last ov 3.21.13 acute with lowne

## 2012-10-22 ENCOUNTER — Ambulatory Visit (INDEPENDENT_AMBULATORY_CARE_PROVIDER_SITE_OTHER): Payer: 59 | Admitting: Family Medicine

## 2012-10-22 ENCOUNTER — Encounter: Payer: Self-pay | Admitting: Family Medicine

## 2012-10-22 VITALS — BP 115/81 | HR 100 | Temp 98.5°F | Ht 66.5 in | Wt 203.4 lb

## 2012-10-22 DIAGNOSIS — J019 Acute sinusitis, unspecified: Secondary | ICD-10-CM

## 2012-10-22 MED ORDER — AMOXICILLIN 875 MG PO TABS: 875.0000 mg | ORAL_TABLET | Freq: Two times a day (BID) | ORAL | Status: DC

## 2012-10-22 MED ORDER — FLUTICASONE PROPIONATE 50 MCG/ACT NA SUSP: 2.0000 | Freq: Every day | NASAL | Status: DC

## 2012-10-22 NOTE — Patient Instructions (Addendum)
This appears to be a sinus infection Start the Amox twice daily- take w/ food Mucinex to thin your congestion Drink plenty of fluids REST! Hang in there!

## 2012-10-22 NOTE — Progress Notes (Signed)
  Subjective:    Patient ID: Hailey Noble Hence, female    DOB: Apr 27, 1966, 46 y.o.   MRN: [id number]  HPI ? Sinus infxn- sxs started Monday night w/ copious PND, sore throat.  Throat is improving.  + nasal congestion.  + maxillary pain.  No fevers.  Bilateral ear pressure.  + sick contacts.  Minimal cough.  + HA.  No tooth pain.   Review of Systems For ROS see HPI     Objective:   Physical Exam  Vitals reviewed. Constitutional: She appears well-developed and well-nourished. No distress.  HENT:  Head: Normocephalic and atraumatic.  Right Ear: Tympanic membrane normal.  Left Ear: Tympanic membrane normal.  Nose: Mucosal edema and rhinorrhea present. Right sinus exhibits maxillary sinus tenderness. Right sinus exhibits no frontal sinus tenderness. Left sinus exhibits maxillary sinus tenderness. Left sinus exhibits no frontal sinus tenderness.  Mouth/Throat: Uvula is midline and mucous membranes are normal. Posterior oropharyngeal erythema present. No oropharyngeal exudate.  Eyes: Conjunctivae normal and EOM are normal. Pupils are equal, round, and reactive to light.  Neck: Normal range of motion. Neck supple.  Cardiovascular: Normal rate, regular rhythm and normal heart sounds.   Pulmonary/Chest: Effort normal and breath sounds normal. No respiratory distress. She has no wheezes.  Lymphadenopathy:    She has no cervical adenopathy.          Assessment & Plan:

## 2012-10-22 NOTE — Assessment & Plan Note (Signed)
Pt's sxs and PE consistent w/ infxn.  Start abx.  Refill flonase.  Reviewed supportive care and red flags that should prompt return.  Pt expressed understanding and is in agreement w/ plan.

## 2012-12-11 ENCOUNTER — Other Ambulatory Visit: Payer: Self-pay | Admitting: Family Medicine

## 2012-12-11 MED ORDER — ESOMEPRAZOLE MAGNESIUM 20 MG PO CPDR: 20.0000 mg | DELAYED_RELEASE_CAPSULE | Freq: Every day | ORAL | Status: DC

## 2012-12-11 NOTE — Telephone Encounter (Signed)
refill nexium dr 30mg  capsule #90 last fill 9.23.13, take 1 capsule by mouth once daily before breakfast--last ov 10.31.13 acute not labs on file

## 2012-12-11 NOTE — Telephone Encounter (Signed)
RX sent to pharmacy by e-script.//AB/CMA

## 2013-06-18 ENCOUNTER — Other Ambulatory Visit: Payer: Self-pay | Admitting: Family Medicine

## 2013-10-04 ENCOUNTER — Other Ambulatory Visit: Payer: Self-pay | Admitting: Family Medicine

## 2013-10-04 NOTE — Telephone Encounter (Signed)
Med filled.  

## 2013-10-28 ENCOUNTER — Other Ambulatory Visit: Payer: Self-pay

## 2013-12-03 LAB — HM MAMMOGRAPHY: HM Mammogram: NORMAL

## 2013-12-03 LAB — HM PAP SMEAR

## 2013-12-05 ENCOUNTER — Encounter (HOSPITAL_COMMUNITY): Payer: Self-pay | Admitting: Emergency Medicine

## 2013-12-05 ENCOUNTER — Emergency Department (HOSPITAL_COMMUNITY)
Admission: EM | Admit: 2013-12-05 | Discharge: 2013-12-05 | Disposition: A | Discharge status: Home / Self Care | Payer: 59 | Source: Home / Self Care | Attending: Emergency Medicine | Admitting: Emergency Medicine

## 2013-12-05 DIAGNOSIS — J329 Chronic sinusitis, unspecified: Secondary | ICD-10-CM

## 2013-12-05 MED ORDER — AMOXICILLIN-POT CLAVULANATE 875-125 MG PO TABS: 1.0000 | ORAL_TABLET | Freq: Two times a day (BID) | ORAL | Status: DC

## 2013-12-05 NOTE — ED Provider Notes (Signed)
Medical screening examination/treatment/procedure(s) were performed by non-physician practitioner and as supervising physician I was immediately available for consultation/collaboration.  Leslee Home, M.D.  Reuben Likes, MD 12/05/13 [phone]

## 2013-12-05 NOTE — ED Provider Notes (Signed)
CSN: [id number]     Arrival date & time 12/05/13  1405 History   First MD Initiated Contact with Patient 12/05/13 1501     Chief Complaint  Patient presents with  . Dizziness  . Otalgia   (Consider location/radiation/quality/duration/timing/severity/associated sxs/prior Treatment) Patient is a 47 y.o. female presenting with ear pain. The history is provided by the patient.  Otalgia Location:  Right Quality:  Aching Severity:  Moderate Onset quality:  Sudden Timing:  Constant Progression:  Worsening Chronicity:  New Relieved by:  Nothing Worsened by:  Nothing tried Ineffective treatments:  None tried Associated symptoms: cough     Past Medical History  Diagnosis Date  . GERD (gastroesophageal reflux disease)   . Sinusitis   . Allergy   . Plantar fasciitis    Past Surgical History  Procedure Laterality Date  . Nasal sinus surgery    . Nasal septum surgery    . Ganglion cyst excision      right  . Tmj arthroscopy      left   Family History  Problem Relation Age of Onset  . Hypertension Father   . Diabetes Father   . Colon cancer Paternal Grandfather   . Breast cancer Mother 54  . Cancer Mother     breast  . Esophageal cancer Neg Hx   . Stomach cancer Neg Hx   . Stroke Neg Hx    History  Substance Use Topics  . Smoking status: Never Smoker   . Smokeless tobacco: Never Used  . Alcohol Use: No   OB History   Grav Para Term Preterm Abortions TAB SAB Ect Mult Living                 Review of Systems  HENT: Positive for ear pain.   Respiratory: Positive for cough.   All other systems reviewed and are negative.    Allergies  Coconut fatty acids; Hydrocod polst-cpm polst er; Tourist information centre manager; Oxycodone-acetaminophen; and Tussin cough  Home Medications   Current Outpatient Rx  Name  Route  Sig  Dispense  Refill  . acetaminophen (TYLENOL) 650 MG CR tablet   Oral   Take 650 mg by mouth every 8 (eight) hours as needed. pain         . amoxicillin  (AMOXIL) 875 MG tablet   Oral   Take 1 tablet (875 mg total) by mouth 2 (two) times daily.   20 tablet   0   . Ascorbic Acid (VITAMIN C) 500 MG tablet   Oral   Take 500 mg by mouth 2 (two) times daily.           Marland Kitchen aspirin 81 MG tablet   Oral   Take 81 mg by mouth daily.           . Calcium Carbonate-Vit D-Min (CALCIUM 1200 PO)   Oral   Take by mouth daily.           Marland Kitchen co-enzyme Q-10 50 MG capsule   Oral   Take 50 mg by mouth daily.         . fluticasone (FLONASE) 50 MCG/ACT nasal spray   Nasal   Place 2 sprays into the nose daily.   16 g   3   . ibuprofen (ADVIL,MOTRIN) 200 MG tablet   Oral   Take 200 mg by mouth every 6 (six) hours as needed. pain         . KRILL OIL 1000 MG CAPS   Oral  Take 1 capsule by mouth daily.         Marland Kitchen loratadine (CLARITIN) 10 MG tablet   Oral   Take 10 mg by mouth daily.           Marland Kitchen NEXIUM 20 MG capsule      TAKE 1 CAPSULE BY MOUTH DAILY BEFORE BREAKFAST.   90 capsule   0     Dispense as written.   Bertram Gala Glycol-Propyl Glycol (SYSTANE FREE OP)   Ophthalmic   Apply 1 drop to eye daily.         Marland Kitchen pyridOXINE (VITAMIN B-6) 50 MG tablet   Oral   Take 50 mg by mouth daily.            BP 148/82  Pulse 108  Temp(Src) 98.3 F (36.8 C) (Oral)  Resp 18  SpO2 98% Physical Exam  Nursing note and vitals reviewed. Constitutional: She appears well-developed.  HENT:  Head: Normocephalic.  Left Ear: External ear normal.  Eyes: Conjunctivae are normal. Pupils are equal, round, and reactive to light.  Neck: Normal range of motion. Neck supple.  Cardiovascular: Normal rate and normal heart sounds.   Pulmonary/Chest: Effort normal.  Abdominal: Soft.  Musculoskeletal: Normal range of motion.  Neurological: She is alert.  Skin: Skin is warm.  Psychiatric: She has a normal mood and affect.    ED Course  Procedures (including critical care time) Labs Review Labs Reviewed - No data to display Imaging  Review No results found.  EKG Interpretation    Date/Time:    Ventricular Rate:    PR Interval:    QRS Duration:   QT Interval:    QTC Calculation:   R Axis:     Text Interpretation:              MDM   1. Sinusitis    Augmentin 875     Lonia Skinner [place], [place] 12/05/13 1545  Lonia Skinner [place], [place] 12/05/13 [phone]

## 2013-12-05 NOTE — ED Notes (Signed)
C/o dizziness, right ear pain and itching, facial pain especially over teeth.  Patient reports a 3 week history of symptoms.  Patient took sudafed x 4 days -last week.  Patient used netipot and tylenol, and saline nasal spray.  Today dizziness and nausea are particularly noticable

## 2013-12-09 ENCOUNTER — Encounter: Payer: Self-pay | Admitting: Family Medicine

## 2014-02-07 ENCOUNTER — Ambulatory Visit (INDEPENDENT_AMBULATORY_CARE_PROVIDER_SITE_OTHER): Payer: 59 | Admitting: Family Medicine

## 2014-02-07 ENCOUNTER — Encounter: Payer: Self-pay | Admitting: Family Medicine

## 2014-02-07 VITALS — BP 118/70 | HR 90 | Temp 98.3°F | Wt 209.0 lb

## 2014-02-07 DIAGNOSIS — H6590 Unspecified nonsuppurative otitis media, unspecified ear: Secondary | ICD-10-CM

## 2014-02-07 MED ORDER — PREDNISONE 10 MG PO TABS: ORAL_TABLET | ORAL | Status: DC

## 2014-02-07 MED ORDER — FLUTICASONE PROPIONATE 50 MCG/ACT NA SUSP: 2.0000 | Freq: Every day | NASAL | Status: DC

## 2014-02-07 NOTE — Progress Notes (Signed)
Pre visit review using our clinic review tool, if applicable. No additional management support is needed unless otherwise documented below in the visit note. 

## 2014-02-07 NOTE — Progress Notes (Signed)
  Subjective:     Hailey Noble is a 48 y.o. female who presents with ear pain and possible ear infection. Symptoms include: right ear pain and plugged sensation in the right ear. Onset of symptoms was 2 months ago, and have been gradually worsening since that time. Associated symptoms include: none.  Patient denies: achiness, chills, congestion, coryza, fever , headache, low grade fever, non productive cough, post nasal drip, productive cough, sinus pressure, sneezing and sore throat. She is drinking plenty of fluids.  The following portions of the patient's history were reviewed and updated as appropriate: allergies, current medications, past family history, past medical history, past social history, past surgical history and problem list.  Review of Systems Pertinent items are noted in HPI.   Objective:    BP 118/70  Pulse 90  Temp(Src) 98.3 F (36.8 C) (Oral)  Wt 209 lb (94.802 kg)  SpO2 96% General:  alert, cooperative, appears stated age and no distress  Right Ear: diminished mobility  Left Ear: normal landmarks and mobility  Mouth:  lips, mucosa, and tongue normal; teeth and gums normal  Neck: no adenopathy, supple, symmetrical, trachea midline and thyroid not enlarged, symmetric, no tenderness/mass/nodules     Assessment:    Right acute otitis media   Plan:    Treatment: No antibiotics indicated at this time and pred taper, flonse, claritin. OTC analgesia as needed. Fluids, rest, avoid carbonated/alcoholic and caffeinated beverages.  Follow up in a few days if not improving. ---pt has an appointment with ENT next week

## 2014-02-07 NOTE — Patient Instructions (Signed)
Serous Otitis Media  Serous otitis media is fluid in the middle ear space. This space contains the bones for hearing and air. Air in the middle ear space helps to transmit sound.  The air gets there through the eustachian tube. This tube goes from the back of the nose (nasopharynx) to the middle ear space. It keeps the pressure in the middle ear the same as the outside world. It also helps to drain fluid from the middle ear space. CAUSES  Serous otitis media occurs when the eustachian tube gets blocked. Blockage can come from:  Ear infections.  Colds and other upper respiratory infections.  Allergies.  Irritants such as cigarette smoke.  Sudden changes in air pressure (such as descending in an airplane).  Enlarged adenoids.  A mass in the nasopharynx. During colds and upper respiratory infections, the middle ear space can become temporarily filled with fluid. This can happen after an ear infection also. Once the infection clears, the fluid will generally drain out of the ear through the eustachian tube. If it does not, then serous otitis media occurs. SIGNS AND SYMPTOMS   Hearing loss.  A feeling of fullness in the ear, without pain.  Young children may not show any symptoms but may show slight behavioral changes, such as agitation, ear pulling, or crying. DIAGNOSIS  Serous otitis media is diagnosed by an ear exam. Tests may be done to check on the movement of the eardrum. Hearing exams may also be done. TREATMENT  The fluid most often goes away without treatment. If allergy is the cause, allergy treatment may be helpful. Fluid that persists for several months may require minor surgery. A small tube is placed in the eardrum to:  Drain the fluid.  Restore the air in the middle ear space. In certain situations, antibiotics are used to avoid surgery. Surgery may be done to remove enlarged adenoids (if this is the cause). HOME CARE INSTRUCTIONS   Keep children away from tobacco  smoke.  Be sure to keep any follow-up appointments. SEEK MEDICAL CARE IF:   Your hearing is not better in 3 months.  Your hearing is worse.  You have ear pain.  You have drainage from the ear.  You have dizziness.  You have serous otitis media only in one ear or have any bleeding from your nose (epistaxis).  You notice a lump on your neck. MAKE SURE YOU:  Understand these instructions.   Will watch your condition.   Will get help right away if you are not doing well or get worse.  Document Released: 02/29/2004 Document Revised: 08/11/2013 Document Reviewed: 07/06/2013 [employer] Patient Information 2014 [employer].  

## 2014-04-05 ENCOUNTER — Telehealth: Payer: Self-pay

## 2014-04-05 NOTE — Telephone Encounter (Signed)
Medication and allergies:  Reviewed and updated  90 day supply/mail order: n/a Local pharmacy:  [place], [place]   Immunizations due:  Td/Tdap?   A/P: No changes to personal, family history or past surgical hx PAP- 12/03/2013- normal per patient CCS- 03/02/09- normal MMG- 12/03/2013-negative Flu- 10/01/13 Tdap- unsure of last vaccine   To Discuss with Provider: Patient would like to have labs drawn.

## 2014-04-05 NOTE — Telephone Encounter (Signed)
4.14.15  Pt called back to speak with Ashlee.  Please contact pt back.

## 2014-04-05 NOTE — Telephone Encounter (Signed)
Left message for call back Non-identifiable   Pap CCS- 03/02/09- normal MMG- 12/03/13-negative Flu- 10/01/13 Td

## 2014-04-06 ENCOUNTER — Ambulatory Visit (INDEPENDENT_AMBULATORY_CARE_PROVIDER_SITE_OTHER): Payer: 59 | Admitting: Family Medicine

## 2014-04-06 ENCOUNTER — Encounter: Payer: Self-pay | Admitting: Family Medicine

## 2014-04-06 VITALS — BP 118/80 | HR 97 | Temp 98.2°F | Resp 16 | Ht 66.5 in | Wt 210.5 lb

## 2014-04-06 DIAGNOSIS — Z23 Encounter for immunization: Secondary | ICD-10-CM

## 2014-04-06 DIAGNOSIS — F341 Dysthymic disorder: Secondary | ICD-10-CM

## 2014-04-06 DIAGNOSIS — F418 Other specified anxiety disorders: Secondary | ICD-10-CM | POA: Insufficient documentation

## 2014-04-06 DIAGNOSIS — E669 Obesity, unspecified: Secondary | ICD-10-CM | POA: Insufficient documentation

## 2014-04-06 DIAGNOSIS — Z0000 Encounter for general adult medical examination without abnormal findings: Secondary | ICD-10-CM

## 2014-04-06 LAB — CBC WITH DIFFERENTIAL/PLATELET
BASOS PCT: 0.8 % (ref 0.0–3.0)
Basophils Absolute: 0.1 10*3/uL (ref 0.0–0.1)
EOS PCT: 1.1 % (ref 0.0–5.0)
Eosinophils Absolute: 0.1 10*3/uL (ref 0.0–0.7)
HCT: 40.6 % (ref 36.0–46.0)
Hemoglobin: 13.6 g/dL (ref 12.0–15.0)
LYMPHS PCT: 22 % (ref 12.0–46.0)
Lymphs Abs: 1.7 10*3/uL (ref 0.7–4.0)
MCHC: 33.6 g/dL (ref 30.0–36.0)
MCV: 89.6 fl (ref 78.0–100.0)
Monocytes Absolute: 0.4 10*3/uL (ref 0.1–1.0)
Monocytes Relative: 5.5 % (ref 3.0–12.0)
NEUTROS PCT: 70.6 % (ref 43.0–77.0)
Neutro Abs: 5.3 10*3/uL (ref 1.4–7.7)
PLATELETS: 371 10*3/uL (ref 150.0–400.0)
RBC: 4.53 Mil/uL (ref 3.87–5.11)
RDW: 13.4 % (ref 11.5–14.6)
WBC: 7.6 10*3/uL (ref 4.5–10.5)

## 2014-04-06 LAB — TSH: TSH: 1.11 u[IU]/mL (ref 0.35–5.50)

## 2014-04-06 LAB — HEPATIC FUNCTION PANEL
ALBUMIN: 3.8 g/dL (ref 3.5–5.2)
ALT: 19 U/L (ref 0–35)
AST: 22 U/L (ref 0–37)
Alkaline Phosphatase: 97 U/L (ref 39–117)
BILIRUBIN TOTAL: 0.7 mg/dL (ref 0.3–1.2)
Bilirubin, Direct: 0 mg/dL (ref 0.0–0.3)
TOTAL PROTEIN: 6.9 g/dL (ref 6.0–8.3)

## 2014-04-06 LAB — BASIC METABOLIC PANEL
BUN: 12 mg/dL (ref 6–23)
CALCIUM: 8.8 mg/dL (ref 8.4–10.5)
CHLORIDE: 106 meq/L (ref 96–112)
CO2: 25 meq/L (ref 19–32)
CREATININE: 0.4 mg/dL (ref 0.4–1.2)
GFR: 165.93 mL/min (ref >60.00)
GLUCOSE: 78 mg/dL (ref 70–99)
Potassium: 3.8 mEq/L (ref 3.5–5.1)
Sodium: 137 mEq/L (ref 135–145)

## 2014-04-06 LAB — LIPID PANEL
CHOL/HDL RATIO: 4
CHOLESTEROL: 163 mg/dL (ref 0–200)
HDL: 45.4 mg/dL (ref >39.00)
LDL Cholesterol: 91 mg/dL (ref 0–99)
TRIGLYCERIDES: 131 mg/dL (ref 0.0–149.0)
VLDL: 26.2 mg/dL (ref 0.0–40.0)

## 2014-04-06 LAB — HEMOGLOBIN A1C: Hgb A1c MFr Bld: 5.1 % (ref 4.6–6.5)

## 2014-04-06 MED ORDER — FLUOXETINE HCL 20 MG PO TABS: 20.0000 mg | ORAL_TABLET | Freq: Every day | ORAL | Status: DC

## 2014-04-06 NOTE — Progress Notes (Signed)
   Subjective:    Patient ID: Hailey Noble, female    DOB: 04-21-1966, 48 y.o.   MRN: [id number]  HPI CPE- UTD on GYN.   Review of Systems Patient reports no vision/ hearing changes, adenopathy,fever, weight change,  persistant/recurrent hoarseness , swallowing issues, chest pain, palpitations, edema, persistant/recurrent cough, hemoptysis, dyspnea (rest/exertional/paroxysmal nocturnal), gastrointestinal bleeding (melena, rectal bleeding), abdominal pain, significant heartburn, bowel changes, GU symptoms (dysuria, hematuria, incontinence), Gyn symptoms (abnormal  bleeding, pain),  syncope, focal weakness, memory loss, numbness & tingling, skin/hair/nail changes, abnormal bruising or bleeding.  + stress- very short temper, increased stress at work.     Objective:   Physical Exam General Appearance:    Alert, cooperative, no distress, appears stated age  Head:    Normocephalic, without obvious abnormality, atraumatic  Eyes:    PERRL, conjunctiva/corneas clear, EOM's intact, fundi    benign, both eyes  Ears:    Normal TM's and external ear canals, both ears  Nose:   Nares normal, septum midline, mucosa normal, no drainage    or sinus tenderness  Throat:   Lips, mucosa, and tongue normal; teeth and gums normal  Neck:   Supple, symmetrical, trachea midline, no adenopathy;    Thyroid: no enlargement/tenderness/nodules  Back:     Symmetric, no curvature, ROM normal, no CVA tenderness  Lungs:     Clear to auscultation bilaterally, respirations unlabored  Chest Wall:    No tenderness or deformity   Heart:    Regular rate and rhythm, S1 and S2 normal, no murmur, rub   or gallop  Breast Exam:    Deferred to GYN  Abdomen:     Soft, non-tender, bowel sounds active all four quadrants,    no masses, no organomegaly  Genitalia:    Deferred to GYN  Rectal:    Extremities:   Extremities normal, atraumatic, no cyanosis or edema  Pulses:   2+ and symmetric all extremities  Skin:   Skin color,  texture, turgor normal, no rashes or lesions  Lymph nodes:   Cervical, supraclavicular, and axillary nodes normal  Neurologic:   CNII-XII intact, normal strength, sensation and reflexes    throughout          Assessment & Plan:

## 2014-04-06 NOTE — Assessment & Plan Note (Signed)
New.  Pt having very stressful situation at work.  Will start low dose SSRI and monitor for improvement.

## 2014-04-06 NOTE — Progress Notes (Signed)
Pre visit review using our clinic review tool, if applicable. No additional management support is needed unless otherwise documented below in the visit note. 

## 2014-04-06 NOTE — Assessment & Plan Note (Signed)
Pt's PE WNL w/ exception of obesity.  UTD on GYN.  Check labs.  Anticipatory guidance provided.  

## 2014-04-06 NOTE — Patient Instructions (Signed)
Follow up in 4-6 weeks to recheck mood Start the Fluoxetine daily for mood We'll notify you of your lab results and make any changes if needed Make sure you are eating regularly- try and graze throughout the day if you don't have time to eat a meal PROTEIN!!  Cheese, PB, yogurt, etc Call with any questions or concerns Hang in there!!!

## 2014-04-06 NOTE — Assessment & Plan Note (Signed)
New.  Stressed need for regular healthy eating and exercise as able.  Will follow.

## 2014-04-12 LAB — VITAMIN D 1,25 DIHYDROXY
VITAMIN D 1, 25 (OH) TOTAL: 58 pg/mL (ref 18–72)
VITAMIN D3 1, 25 (OH): 58 pg/mL

## 2014-05-02 ENCOUNTER — Other Ambulatory Visit: Payer: Self-pay | Admitting: Family Medicine

## 2014-05-02 NOTE — Telephone Encounter (Signed)
Med filled.  

## 2014-08-30 ENCOUNTER — Other Ambulatory Visit: Payer: Self-pay | Admitting: Family Medicine

## 2014-08-31 NOTE — Telephone Encounter (Signed)
Med filled.  

## 2014-10-07 ENCOUNTER — Other Ambulatory Visit: Payer: Self-pay

## 2014-12-06 LAB — HM MAMMOGRAPHY

## 2014-12-08 ENCOUNTER — Encounter: Payer: Self-pay | Admitting: General Practice

## 2014-12-21 ENCOUNTER — Encounter: Payer: Self-pay | Admitting: Family Medicine

## 2015-01-02 ENCOUNTER — Other Ambulatory Visit: Payer: Self-pay | Admitting: Family Medicine

## 2015-01-02 NOTE — Telephone Encounter (Signed)
Med filled.  

## 2015-01-12 ENCOUNTER — Other Ambulatory Visit: Payer: Self-pay | Admitting: Family Medicine

## 2015-01-12 MED ORDER — FLUOXETINE HCL 20 MG PO CAPS: 20.0000 mg | ORAL_CAPSULE | Freq: Every day | ORAL | Status: DC

## 2015-01-12 NOTE — Telephone Encounter (Signed)
Med denied, just filled 01/02/2015. Pt needs an appt.

## 2015-03-16 ENCOUNTER — Other Ambulatory Visit: Payer: Self-pay | Admitting: Family Medicine

## 2015-03-16 NOTE — Telephone Encounter (Signed)
Med filled.  

## 2015-04-11 ENCOUNTER — Other Ambulatory Visit: Payer: Self-pay | Admitting: Family Medicine

## 2015-04-11 NOTE — Telephone Encounter (Signed)
Med filled.  

## 2015-04-17 ENCOUNTER — Telehealth: Payer: Self-pay | Admitting: *Deleted

## 2015-04-17 ENCOUNTER — Other Ambulatory Visit: Payer: Self-pay | Admitting: *Deleted

## 2015-04-17 ENCOUNTER — Ambulatory Visit (HOSPITAL_COMMUNITY)
Admission: RE | Admit: 2015-04-17 | Discharge: 2015-04-17 | Disposition: A | Discharge status: Home / Self Care | Payer: 59 | Source: Ambulatory Visit | Attending: Oncology | Admitting: Oncology

## 2015-04-17 DIAGNOSIS — R918 Other nonspecific abnormal finding of lung field: Secondary | ICD-10-CM | POA: Insufficient documentation

## 2015-04-17 DIAGNOSIS — R0989 Other specified symptoms and signs involving the circulatory and respiratory systems: Secondary | ICD-10-CM | POA: Insufficient documentation

## 2015-04-17 DIAGNOSIS — R05 Cough: Secondary | ICD-10-CM

## 2015-04-17 DIAGNOSIS — R059 Cough, unspecified: Secondary | ICD-10-CM

## 2015-04-17 DIAGNOSIS — J018 Other acute sinusitis: Secondary | ICD-10-CM

## 2015-04-17 DIAGNOSIS — R0689 Other abnormalities of breathing: Secondary | ICD-10-CM

## 2015-04-17 MED ORDER — AZITHROMYCIN 250 MG PO TABS: ORAL_TABLET | ORAL | Status: DC

## 2015-04-17 MED ORDER — MOMETASONE FUROATE 50 MCG/ACT NA SUSP: 2.0000 | Freq: Every day | NASAL | Status: DC

## 2015-04-17 NOTE — Telephone Encounter (Signed)
Please call pt and see if she wants to be seen for xray results sooner than July (CPE)

## 2015-04-17 NOTE — Telephone Encounter (Signed)
Called pt Hailey Noble to return call in regards to appt.

## 2015-04-17 NOTE — Telephone Encounter (Signed)
Per courtesy MD obtained CXR due to Hailey Noble's ongoing cough with SOB and decreased lung sounds.  Xray does not show any pneumonia but does show area of concern requesting follow up.  Film report reviewed with Hailey Noble who states she has a known area of injury per fall several years ago with fractured ribs.  Hailey Noble is scheduled for follow up with her primary MD- Dr Birdie Riddle for yearly exam and will take xray to her.  This RN will forward this note and report to Dr Birdie Riddle for information purposes only.

## 2015-04-18 NOTE — Telephone Encounter (Signed)
Pt returned your call and stated that she is fine with keeping her CPE appointment for 07/21/15 Pt stated she thought she had pneumonia but the results indicated she did not.

## 2015-05-26 ENCOUNTER — Ambulatory Visit (HOSPITAL_COMMUNITY)
Admission: RE | Admit: 2015-05-26 | Discharge: 2015-05-26 | Disposition: A | Discharge status: Home / Self Care | Payer: 59 | Source: Ambulatory Visit | Attending: Family Medicine | Admitting: Family Medicine

## 2015-05-26 ENCOUNTER — Ambulatory Visit (INDEPENDENT_AMBULATORY_CARE_PROVIDER_SITE_OTHER): Payer: 59 | Admitting: Family Medicine

## 2015-05-26 ENCOUNTER — Encounter: Payer: Self-pay | Admitting: Family Medicine

## 2015-05-26 ENCOUNTER — Ambulatory Visit (HOSPITAL_BASED_OUTPATIENT_CLINIC_OR_DEPARTMENT_OTHER)
Admission: RE | Admit: 2015-05-26 | Discharge: 2015-05-26 | Disposition: A | Discharge status: Home / Self Care | Payer: 59 | Source: Ambulatory Visit | Attending: Family Medicine | Admitting: Family Medicine

## 2015-05-26 ENCOUNTER — Other Ambulatory Visit: Payer: Self-pay | Admitting: Family Medicine

## 2015-05-26 ENCOUNTER — Other Ambulatory Visit: Payer: Self-pay | Admitting: General Practice

## 2015-05-26 VITALS — BP 120/80 | HR 86 | Temp 98.3°F | Resp 16 | Ht 65.75 in | Wt 206.0 lb

## 2015-05-26 DIAGNOSIS — R9389 Abnormal findings on diagnostic imaging of other specified body structures: Secondary | ICD-10-CM | POA: Insufficient documentation

## 2015-05-26 DIAGNOSIS — R911 Solitary pulmonary nodule: Secondary | ICD-10-CM

## 2015-05-26 DIAGNOSIS — R938 Abnormal findings on diagnostic imaging of other specified body structures: Secondary | ICD-10-CM

## 2015-05-26 DIAGNOSIS — R918 Other nonspecific abnormal finding of lung field: Secondary | ICD-10-CM | POA: Diagnosis present

## 2015-05-26 DIAGNOSIS — Z0000 Encounter for general adult medical examination without abnormal findings: Secondary | ICD-10-CM | POA: Diagnosis not present

## 2015-05-26 LAB — LIPID PANEL
Cholesterol: 148 mg/dL (ref 0–200)
HDL: 47.2 mg/dL (ref >39.00)
LDL CALC: 76 mg/dL (ref 0–99)
NONHDL: 100.8
TRIGLYCERIDES: 124 mg/dL (ref 0.0–149.0)
Total CHOL/HDL Ratio: 3
VLDL: 24.8 mg/dL (ref 0.0–40.0)

## 2015-05-26 LAB — BASIC METABOLIC PANEL
BUN: 14 mg/dL (ref 6–23)
CALCIUM: 8.9 mg/dL (ref 8.4–10.5)
CHLORIDE: 105 meq/L (ref 96–112)
CO2: 26 meq/L (ref 19–32)
Creatinine, Ser: 0.6 mg/dL (ref 0.40–1.20)
GFR: 112.44 mL/min (ref >60.00)
GLUCOSE: 85 mg/dL (ref 70–99)
Potassium: 4.1 mEq/L (ref 3.5–5.1)
SODIUM: 136 meq/L (ref 135–145)

## 2015-05-26 LAB — HEPATIC FUNCTION PANEL
ALT: 14 U/L (ref 0–35)
AST: 15 U/L (ref 0–37)
Albumin: 4.1 g/dL (ref 3.5–5.2)
Alkaline Phosphatase: 109 U/L (ref 39–117)
Bilirubin, Direct: 0.1 mg/dL (ref 0.0–0.3)
Total Bilirubin: 0.4 mg/dL (ref 0.2–1.2)
Total Protein: 7 g/dL (ref 6.0–8.3)

## 2015-05-26 LAB — CBC WITH DIFFERENTIAL/PLATELET
Basophils Absolute: 0 10*3/uL (ref 0.0–0.1)
Basophils Relative: 0.6 % (ref 0.0–3.0)
EOS ABS: 0.1 10*3/uL (ref 0.0–0.7)
Eosinophils Relative: 0.8 % (ref 0.0–5.0)
HCT: 40.5 % (ref 36.0–46.0)
HEMOGLOBIN: 13.6 g/dL (ref 12.0–15.0)
LYMPHS ABS: 1.7 10*3/uL (ref 0.7–4.0)
LYMPHS PCT: 22.6 % (ref 12.0–46.0)
MCHC: 33.7 g/dL (ref 30.0–36.0)
MCV: 88.5 fl (ref 78.0–100.0)
MONO ABS: 0.5 10*3/uL (ref 0.1–1.0)
Monocytes Relative: 6.1 % (ref 3.0–12.0)
NEUTROS PCT: 69.9 % (ref 43.0–77.0)
Neutro Abs: 5.3 10*3/uL (ref 1.4–7.7)
PLATELETS: 337 10*3/uL (ref 150.0–400.0)
RBC: 4.57 Mil/uL (ref 3.87–5.11)
RDW: 13.6 % (ref 11.5–15.5)
WBC: 7.6 10*3/uL (ref 4.0–10.5)

## 2015-05-26 LAB — TSH: TSH: 1.67 u[IU]/mL (ref 0.35–4.50)

## 2015-05-26 LAB — VITAMIN D 25 HYDROXY (VIT D DEFICIENCY, FRACTURES): VITD: 18.07 ng/mL — AB (ref 30.00–100.00)

## 2015-05-26 MED ORDER — MOMETASONE FUROATE 50 MCG/ACT NA SUSP: 2.0000 | Freq: Every day | NASAL | Status: DC

## 2015-05-26 MED ORDER — IOHEXOL 300 MG/ML  SOLN
100.0000 mL | Freq: Once | INTRAMUSCULAR | Status: AC | PRN
  Administered 2015-05-26: 80 mL via INTRAVENOUS

## 2015-05-26 MED ORDER — VITAMIN D (ERGOCALCIFEROL) 1.25 MG (50000 UNIT) PO CAPS: 50000.0000 [IU] | ORAL_CAPSULE | ORAL | Status: DC

## 2015-05-26 NOTE — Progress Notes (Signed)
Pre visit review using our clinic review tool, if applicable. No additional management support is needed unless otherwise documented below in the visit note. 

## 2015-05-26 NOTE — Progress Notes (Signed)
   Subjective:    Patient ID: Hailey Noble, female    DOB: [birth year], 49 y.o.   MRN: [id number]  HPI CPE- UTD on GYN ([facility]).  Colonoscopy- due next May  Abnormal cxr- on 4/25 was noted to have suspected bone island in 5th R rib.  Recommended rib imaging.   Review of Systems Patient reports no vision/ hearing changes, adenopathy,fever, weight change,  persistant/recurrent hoarseness , swallowing issues, chest pain, palpitations, edema, persistant/recurrent cough, hemoptysis, dyspnea (rest/exertional/paroxysmal nocturnal), gastrointestinal bleeding (melena, rectal bleeding), abdominal pain, significant heartburn, bowel changes, GU symptoms (dysuria, hematuria, incontinence), Gyn symptoms (abnormal  bleeding, pain),  syncope, focal weakness, memory loss, numbness & tingling, skin/hair/nail changes, abnormal bruising or bleeding, anxiety, or depression.     Objective:   Physical Exam General Appearance:    Alert, cooperative, no distress, appears stated age  Head:    Normocephalic, without obvious abnormality, atraumatic  Eyes:    PERRL, conjunctiva/corneas clear, EOM's intact, fundi    benign, both eyes  Ears:    Normal TM's and external ear canals, both ears  Nose:   Nares normal, septum midline, mucosa normal, no drainage    or sinus tenderness  Throat:   Lips, mucosa, and tongue normal; teeth and gums normal  Neck:   Supple, symmetrical, trachea midline, no adenopathy;    Thyroid: no enlargement/tenderness/nodules  Back:     Symmetric, no curvature, ROM normal, no CVA tenderness  Lungs:     Clear to auscultation bilaterally, respirations unlabored  Chest Wall:    No tenderness or deformity   Heart:    Regular rate and rhythm, S1 and S2 normal, no murmur, rub   or gallop  Breast Exam:    Deferred to GYN  Abdomen:     Soft, non-tender, bowel sounds active all four quadrants,    no masses, no organomegaly  Genitalia:    Deferred to GYN  Rectal:    Extremities:   Extremities  normal, atraumatic, no cyanosis or edema  Pulses:   2+ and symmetric all extremities  Skin:   Skin color, texture, turgor normal, no rashes or lesions  Lymph nodes:   Cervical, supraclavicular, and axillary nodes normal  Neurologic:   CNII-XII intact, normal strength, sensation and reflexes    throughout          Assessment & Plan:

## 2015-05-26 NOTE — Assessment & Plan Note (Signed)
New.  Noted on 4/25 when pt had CXR done for cough.  Unclear if bony abnormality vs lung nodule.  Will order rib imaging as recommended.  Pt expressed understanding and is in agreement w/ plan.

## 2015-05-26 NOTE — Patient Instructions (Signed)
Follow up in 1 year or as needed Go downstairs and get your xray done- we'll notify you of your results Decrease the Prozac to every other day for 1-2 weeks and then stop.  If trouble w/ this, you can restart at any time (just let me know via MyChart how it's going) Call with any questions or concerns Keep up the good work on healthy diet and regular exercise- you look great! Have a wonderful vacation!!!

## 2015-05-26 NOTE — Assessment & Plan Note (Signed)
Pt's PE WNL.  UTD on GYN, colonoscopy.  Check labs.  Anticipatory guidance provided.  

## 2015-05-30 ENCOUNTER — Other Ambulatory Visit: Payer: Self-pay | Admitting: General Practice

## 2015-05-30 MED ORDER — PANTOPRAZOLE SODIUM 40 MG PO TBEC
40.0000 mg | DELAYED_RELEASE_TABLET | Freq: Every day | ORAL | Status: DC
Start: 2015-05-30 — End: 2015-11-08

## 2015-07-10 ENCOUNTER — Encounter (HOSPITAL_COMMUNITY): Payer: Self-pay | Admitting: Emergency Medicine

## 2015-07-10 ENCOUNTER — Emergency Department (HOSPITAL_COMMUNITY)
Admission: EM | Admit: 2015-07-10 | Discharge: 2015-07-10 | Disposition: A | Discharge status: Home / Self Care | Payer: 59 | Source: Home / Self Care | Attending: Family Medicine | Admitting: Family Medicine

## 2015-07-10 DIAGNOSIS — L237 Allergic contact dermatitis due to plants, except food: Secondary | ICD-10-CM

## 2015-07-10 MED ORDER — METHYLPREDNISOLONE ACETATE 40 MG/ML IJ SUSP
80.0000 mg | Freq: Once | INTRAMUSCULAR | Status: AC
  Administered 2015-07-10: 80 mg via INTRAMUSCULAR

## 2015-07-10 MED ORDER — FLUTICASONE PROPIONATE 0.05 % EX CREA: TOPICAL_CREAM | Freq: Two times a day (BID) | CUTANEOUS | Status: DC

## 2015-07-10 MED ORDER — TRIAMCINOLONE ACETONIDE 40 MG/ML IJ SUSP
INTRAMUSCULAR | Status: AC
  Filled 2015-07-10: qty 1

## 2015-07-10 MED ORDER — METHYLPREDNISOLONE ACETATE 80 MG/ML IJ SUSP
INTRAMUSCULAR | Status: AC
  Filled 2015-07-10: qty 1

## 2015-07-10 MED ORDER — METHYLPREDNISOLONE 4 MG PO TBPK: ORAL_TABLET | ORAL | Status: DC

## 2015-07-10 MED ORDER — HYDROXYZINE HCL 25 MG PO TABS: 25.0000 mg | ORAL_TABLET | ORAL | Status: DC | PRN

## 2015-07-10 MED ORDER — TRIAMCINOLONE ACETONIDE 40 MG/ML IJ SUSP
40.0000 mg | Freq: Once | INTRAMUSCULAR | Status: AC
  Administered 2015-07-10: 40 mg via INTRAMUSCULAR

## 2015-07-10 NOTE — ED Provider Notes (Signed)
CSN: [id number]     Arrival date & time 07/10/15  1310 History   None    Chief Complaint  Patient presents with  . Poison Ivy   (Consider location/radiation/quality/duration/timing/severity/associated sxs/prior Treatment) Patient is a 49 y.o. female presenting with poison ivy. The history is provided by the patient.  Poison Karlene Einstein This is a recurrent problem. The current episode started more than 1 week ago. The problem has been gradually worsening (onset since mowing grass 10 days ago.). Pertinent negatives include no chest pain and no abdominal pain.    Past Medical History  Diagnosis Date  . GERD (gastroesophageal reflux disease)   . Sinusitis   . Allergy   . Plantar fasciitis    Past Surgical History  Procedure Laterality Date  . Nasal sinus surgery    . Nasal septum surgery    . Ganglion cyst excision      right  . Tmj arthroscopy      left   Family History  Problem Relation Age of Onset  . Hypertension Father   . Diabetes Father   . Colon cancer Paternal Grandfather   . Breast cancer Mother 81  . Cancer Mother     breast  . Esophageal cancer Neg Hx   . Stomach cancer Neg Hx   . Stroke Neg Hx    History  Substance Use Topics  . Smoking status: Never Smoker   . Smokeless tobacco: Never Used  . Alcohol Use: No   OB History    No data available     Review of Systems  Cardiovascular: Negative for chest pain.  Gastrointestinal: Negative for abdominal pain.  Skin: Positive for rash.    Allergies  Coconut fatty acids; Hydrocod polst-cpm polst er; Regulatory affairs officer; Oxycodone-acetaminophen; Tussin cough; and Ultram  Home Medications   Prior to Admission medications   Medication Sig Start Date End Date Taking? Authorizing Provider  diphenhydrAMINE (BENADRYL) 50 MG capsule Take 50 mg by mouth every 6 (six) hours as needed.   Yes Historical Provider, MD  predniSONE (DELTASONE) 10 MG tablet Take 10 mg by mouth daily with breakfast. Patient has been on a prednisone  taper that started Tuesday 7/12 at 40mg .   Yes Historical Provider, MD  acetaminophen (TYLENOL) 650 MG CR tablet Take 650 mg by mouth every 8 (eight) hours as needed. pain    Historical Provider, MD  aspirin 81 MG tablet Take 81 mg by mouth daily.      Historical Provider, MD  Calcium Carbonate-Vit D-Min (CALCIUM 1200 PO) Take by mouth daily.      Historical Provider, MD  cetirizine (ZYRTEC) 10 MG tablet Take 10 mg by mouth daily.    Historical Provider, MD  co-enzyme Q-10 50 MG capsule Take 50 mg by mouth daily.    Historical Provider, MD  FLUoxetine (PROZAC) 20 MG capsule Take 1 capsule (20 mg total) by mouth daily. 03/16/15   Midge Minium, MD  fluticasone (CUTIVATE) 0.05 % cream Apply topically 2 (two) times daily. To worst areas of rash. 07/10/15   Billy Fischer, MD  hydrOXYzine (ATARAX/VISTARIL) 25 MG tablet Take 1 tablet (25 mg total) by mouth every 4 (four) hours as needed. 07/10/15   Billy Fischer, MD  ibuprofen (ADVIL,MOTRIN) 200 MG tablet Take 200 mg by mouth every 6 (six) hours as needed. pain    Historical Provider, MD  KRILL OIL 1000 MG CAPS Take 1 capsule by mouth daily.    Historical Provider, MD  methylPREDNISolone (MEDROL  DOSEPAK) 4 MG TBPK tablet follow package directions, start on tues, take until finished. 07/10/15   Billy Fischer, MD  mometasone (NASONEX) 50 MCG/ACT nasal spray Place 2 sprays into the nose daily. 05/26/15   Midge Minium, MD  pantoprazole (PROTONIX) 40 MG tablet Take 1 tablet (40 mg total) by mouth daily. 05/30/15   Midge Minium, MD  Polyethyl Glycol-Propyl Glycol (SYSTANE FREE OP) Apply 1 drop to eye daily.    Historical Provider, MD  Vitamin D, Ergocalciferol, (DRISDOL) 50000 UNITS CAPS capsule Take 1 capsule (50,000 Units total) by mouth every 7 (seven) days. 05/26/15   Midge Minium, MD   BP 131/76 mmHg  Pulse 88  Temp(Src) 98.1 F (36.7 C) (Oral)  Resp 16  SpO2 97% Physical Exam  Constitutional: She is oriented to person, place, and time.  She appears well-developed and well-nourished. No distress.  Neurological: She is alert and oriented to person, place, and time.  Skin: Skin is warm and dry. Rash noted. There is erythema.  Patchy streaky, erythematous papulovesicular rash on ext and trunk in irreg distribution.  Nursing note and vitals reviewed.   ED Course  Procedures (including critical care time) Labs Review Labs Reviewed - No data to display  Imaging Review No results found.   MDM   1. Contact dermatitis due to poison ivy        Billy Fischer, MD 07/10/15 [phone]

## 2015-07-10 NOTE — ED Notes (Signed)
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  Patient has a red, itchy rash on left lower leg that has spread to all extremities and now trunk of body today.  Patient is on prednisone 10mg . Started on prednisone taper on Tuesday 7/12 with prednisone 40 mg, then 7/13, 7/14.  7/15, 7/16, and 7/17 took prednisone 20 mg.

## 2015-07-21 ENCOUNTER — Encounter: Payer: 59 | Admitting: Family Medicine

## 2015-11-08 ENCOUNTER — Other Ambulatory Visit: Payer: Self-pay | Admitting: General Practice

## 2015-11-08 MED ORDER — PANTOPRAZOLE SODIUM 40 MG PO TBEC: 40.0000 mg | DELAYED_RELEASE_TABLET | Freq: Every day | ORAL | Status: DC

## 2015-12-08 LAB — HM MAMMOGRAPHY

## 2015-12-13 ENCOUNTER — Encounter: Payer: Self-pay | Admitting: General Practice

## 2015-12-29 DIAGNOSIS — T8331XD Breakdown (mechanical) of intrauterine contraceptive device, subsequent encounter: Secondary | ICD-10-CM | POA: Diagnosis not present

## 2015-12-29 DIAGNOSIS — Z30432 Encounter for removal of intrauterine contraceptive device: Secondary | ICD-10-CM | POA: Diagnosis not present

## 2016-01-04 ENCOUNTER — Encounter: Payer: Self-pay | Admitting: Internal Medicine

## 2016-02-08 ENCOUNTER — Telehealth: Payer: Self-pay | Admitting: Family Medicine

## 2016-02-08 ENCOUNTER — Other Ambulatory Visit: Payer: Self-pay

## 2016-02-08 MED ORDER — AZITHROMYCIN 250 MG PO TABS: ORAL_TABLET | ORAL | Status: DC

## 2016-02-08 MED FILL — AZITHROMYCIN 250 MG TABLET: 250 | 5 days supply | Qty: 6 | Fill #0

## 2016-02-08 NOTE — Telephone Encounter (Signed)
Pt newphew was DX last week with whooping cough. 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 The family was recommended to have a zpack to prevent. Pt works at the [employer] and is afraid of getting pts sick. She is having no symptoms but worried. Ph# [phone].

## 2016-02-08 NOTE — Telephone Encounter (Addendum)
Z pak order sent to pharmacy. Patient notified via answering machine.

## 2016-02-08 NOTE — Telephone Encounter (Signed)
Ok for Zpack 

## 2016-02-27 MED FILL — FLUTICASONE PROP 50 MCG SPR: 50 | 30 days supply | Qty: 16 | Fill #1

## 2016-02-27 MED FILL — CLOBETASOL 0.05% CREAM: 0.05 | 20 days supply | Qty: 60 | Fill #3

## 2016-02-29 ENCOUNTER — Encounter: Payer: Self-pay | Admitting: Internal Medicine

## 2016-04-15 ENCOUNTER — Ambulatory Visit (AMBULATORY_SURGERY_CENTER): Payer: Self-pay | Admitting: *Deleted

## 2016-04-15 VITALS — Ht 67.0 in | Wt 214.0 lb

## 2016-04-15 DIAGNOSIS — Z1211 Encounter for screening for malignant neoplasm of colon: Secondary | ICD-10-CM

## 2016-04-15 MED ORDER — NA SULFATE-K SULFATE-MG SULF 17.5-3.13-1.6 GM/177ML PO SOLN: 1.0000 | Freq: Once | ORAL | Status: DC

## 2016-04-15 NOTE — Progress Notes (Signed)
No egg or soy allergy known to patient  No issues with past sedation with any surgeries  or procedures, no intubation problems  No diet pills per patient No home 02 use per patient  No blood thinners per patient  Pt denies issues with constipation  emmi declined

## 2016-04-17 MED FILL — SUPREP BOWEL PREP KIT: 17.5-3.13-1 | 1 days supply | Qty: 354 | Fill #0

## 2016-05-02 ENCOUNTER — Encounter: Payer: Self-pay | Admitting: Internal Medicine

## 2016-05-02 ENCOUNTER — Ambulatory Visit (AMBULATORY_SURGERY_CENTER): Payer: 59 | Admitting: Internal Medicine

## 2016-05-02 VITALS — BP 138/78 | HR 82 | Temp 97.1°F | Resp 12 | Ht 67.0 in | Wt 214.0 lb

## 2016-05-02 DIAGNOSIS — Z1211 Encounter for screening for malignant neoplasm of colon: Secondary | ICD-10-CM

## 2016-05-02 DIAGNOSIS — D120 Benign neoplasm of cecum: Secondary | ICD-10-CM | POA: Diagnosis not present

## 2016-05-02 DIAGNOSIS — F419 Anxiety disorder, unspecified: Secondary | ICD-10-CM | POA: Diagnosis not present

## 2016-05-02 DIAGNOSIS — K219 Gastro-esophageal reflux disease without esophagitis: Secondary | ICD-10-CM | POA: Diagnosis not present

## 2016-05-02 MED ORDER — SODIUM CHLORIDE 0.9 % IV SOLN: 500.0000 mL | INTRAVENOUS | Status: DC

## 2016-05-02 NOTE — Op Note (Signed)
[facility] Patient Name: Hailey Noble Procedure Date: 05/02/2016 9:07 AM MRN: [id number] Endoscopist: Docia Chuck. Henrene Pastor , MD Age: 50 Date of Birth: 1966/12/19 Gender: Female Procedure:                Colonoscopy, with snare polypectomy X 1 Indications:              Screening for colorectal malignant neoplasm Medicines:                Monitored Anesthesia Care Procedure:                Pre-Anesthesia Assessment:                           - Prior to the procedure, a History and Physical                            was performed, and patient medications and                            allergies were reviewed. The patient's tolerance of                            previous anesthesia was also reviewed. The risks                            and benefits of the procedure and the sedation                            options and risks were discussed with the patient.                            All questions were answered, and informed consent                            was obtained. Prior Anticoagulants: The patient has                            taken no previous anticoagulant or antiplatelet                            agents. ASA Grade Assessment: II - A patient with                            mild systemic disease. After reviewing the risks                            and benefits, the patient was deemed in                            satisfactory condition to undergo the procedure.                           After obtaining informed consent, the colonoscope  was passed under direct vision. Throughout the                            procedure, the patient's blood pressure, pulse, and                            oxygen saturations were monitored continuously. The                            Model CF-HQ190L [id number]) scope was introduced                            through the anus and advanced to the the cecum,                            identified by appendiceal  orifice and ileocecal                            valve. The ileocecal valve, appendiceal orifice,                            and rectum were photographed. The quality of the                            bowel preparation was excellent. The colonoscopy                            was performed without difficulty. The patient                            tolerated the procedure well. The bowel preparation                            used was SUPREP. Scope In: 9:23:53 AM Scope Out: 9:36:13 AM Scope Withdrawal Time: 0 hours 8 minutes 53 seconds  Total Procedure Duration: 0 hours 12 minutes 20 seconds  Findings:                 A 1 mm polyp was found in the cecum. The polyp was                            removed with a cold snare. Resection and retrieval                            were complete.                           The exam was otherwise without abnormality on                            direct and retroflexion views. Complications:            No immediate complications. Estimated blood loss:  None. Estimated Blood Loss:     Estimated blood loss: none. Impression:               - One 1 mm polyp in the cecum, removed with a cold                            snare. Resected and retrieved.                           - The examination was otherwise normal on direct                            and retroflexion views. Recommendation:           - Repeat colonoscopy in 5-10 years for surveillance                            based on pathology results.                           - Await pathology results. Docia Chuck. Henrene Pastor, MD 05/02/2016 9:42:39 AM This report has been signed electronically. CC Letter to:             Aundra Millet. Birdie Riddle, MD

## 2016-05-02 NOTE — Patient Instructions (Signed)
YOU HAD AN ENDOSCOPIC PROCEDURE TODAY AT [facility]:   Refer to the procedure report that was given to you for any specific questions about what was found during the examination.  If the procedure report does not answer your questions, please call your gastroenterologist to clarify.  If you requested that your care partner not be given the details of your procedure findings, then the procedure report has been included in a sealed envelope for you to review at your convenience later.  YOU SHOULD EXPECT: Some feelings of bloating in the abdomen. Passage of more gas than usual.  Walking can help get rid of the air that was put into your GI tract during the procedure and reduce the bloating. If you had a lower endoscopy (such as a colonoscopy or flexible sigmoidoscopy) you may notice spotting of blood in your stool or on the toilet paper. If you underwent a bowel prep for your procedure, you may not have a normal bowel movement for a few days.  Please Note:  You might notice some irritation and congestion in your nose or some drainage.  This is from the oxygen used during your procedure.  There is no need for concern and it should clear up in a day or so.  SYMPTOMS TO REPORT IMMEDIATELY:   Following lower endoscopy (colonoscopy or flexible sigmoidoscopy):  Excessive amounts of blood in the stool  Significant tenderness or worsening of abdominal pains  Swelling of the abdomen that is new, acute  Fever of 100F or higher   For urgent or emergent issues, a gastroenterologist can be reached at any hour by calling [phone].   DIET: Your first meal following the procedure should be a small meal and then it is ok to progress to your normal diet. Heavy or fried foods are harder to digest and may make you feel nauseous or bloated.  Likewise, meals heavy in dairy and vegetables can increase bloating.  Drink plenty of fluids but you should avoid alcoholic beverages for 24  hours.  ACTIVITY:  You should plan to take it easy for the rest of today and you should NOT DRIVE or use heavy machinery until tomorrow (because of the sedation medicines used during the test).    FOLLOW UP: Our staff will call the number listed on your records the next business day following your procedure to check on you and address any questions or concerns that you may have regarding the information given to you following your procedure. If we do not reach you, we will leave a message.  However, if you are feeling well and you are not experiencing any problems, there is no need to return our call.  We will assume that you have returned to your regular daily activities without incident.  If any biopsies were taken you will be contacted by phone or by letter within the next 1-3 weeks.  Please call us at [phone] if you have not heard about the biopsies in 3 weeks.    SIGNATURES/CONFIDENTIALITY: You and/or your care partner have signed paperwork which will be entered into your electronic medical record.  These signatures attest to the fact that that the information above on your After Visit Summary has been reviewed and is understood.  Full responsibility of the confidentiality of this discharge information lies with you and/or your care-partner.  Thank-you for choosing [name] for your healthcare needs today.

## 2016-05-02 NOTE — Progress Notes (Signed)
Report to PACU, RN, vss, BBS= Clear.  

## 2016-05-02 NOTE — Progress Notes (Signed)
Called to room to assist during endoscopic procedure.  Patient ID and intended procedure confirmed with present staff. Received instructions for my participation in the procedure from the performing physician.  

## 2016-05-03 ENCOUNTER — Telehealth: Payer: Self-pay

## 2016-05-03 NOTE — Telephone Encounter (Signed)
  Follow up Call-  Call back number 05/02/2016  Post procedure Call Back phone  # [phone] cell  Permission to leave phone message Yes     Patient was called for follow up after her procedure on 05/02/2016. No answer at the number given for follow up phone call. A message was left on the answering machine.

## 2016-05-06 ENCOUNTER — Encounter: Payer: Self-pay | Admitting: Internal Medicine

## 2016-05-27 ENCOUNTER — Encounter: Payer: 59 | Admitting: Family Medicine

## 2016-08-12 ENCOUNTER — Telehealth: Payer: Self-pay | Admitting: Family Medicine

## 2016-08-12 NOTE — Telephone Encounter (Signed)
OK to transfer 

## 2016-08-12 NOTE — Telephone Encounter (Signed)
Caller name: Surabhi Pignotti Relationship to patient: self Can be reached: [phone]  Reason for call: pt would like to change to Dr. Charlett Blake because she cannot go to Dr. Virgil Benedict new office

## 2016-08-12 NOTE — Telephone Encounter (Signed)
Ok to transfer. 

## 2016-08-13 NOTE — Telephone Encounter (Signed)
Scheduled pt for 11/22/16 cpe (first available), LM to notify pt ok to change provider and appt date

## 2016-08-21 NOTE — Telephone Encounter (Signed)
Pt called back in to follow up. Confirmed appt date / time with pt.

## 2016-10-18 MED FILL — MOMETASONE FUROATE 50 MCG S: 50 | 90 days supply | Qty: 51 | Fill #0

## 2016-11-22 ENCOUNTER — Ambulatory Visit (INDEPENDENT_AMBULATORY_CARE_PROVIDER_SITE_OTHER): Payer: 59 | Admitting: Family Medicine

## 2016-11-22 ENCOUNTER — Encounter: Payer: Self-pay | Admitting: Family Medicine

## 2016-11-22 VITALS — BP 118/76 | HR 85 | Temp 98.4°F | Wt 206.8 lb

## 2016-11-22 DIAGNOSIS — E669 Obesity, unspecified: Secondary | ICD-10-CM | POA: Diagnosis not present

## 2016-11-22 DIAGNOSIS — E559 Vitamin D deficiency, unspecified: Secondary | ICD-10-CM | POA: Insufficient documentation

## 2016-11-22 DIAGNOSIS — F418 Other specified anxiety disorders: Secondary | ICD-10-CM

## 2016-11-22 DIAGNOSIS — H524 Presbyopia: Secondary | ICD-10-CM | POA: Diagnosis not present

## 2016-11-22 DIAGNOSIS — Z0000 Encounter for general adult medical examination without abnormal findings: Secondary | ICD-10-CM

## 2016-11-22 DIAGNOSIS — T7840XD Allergy, unspecified, subsequent encounter: Secondary | ICD-10-CM

## 2016-11-22 HISTORY — DX: Vitamin D deficiency, unspecified: E55.9

## 2016-11-22 LAB — COMPREHENSIVE METABOLIC PANEL
ALK PHOS: 84 U/L (ref 33–130)
ALT: 12 U/L (ref 6–29)
AST: 13 U/L (ref 10–35)
Albumin: 4 g/dL (ref 3.6–5.1)
BILIRUBIN TOTAL: 0.3 mg/dL (ref 0.2–1.2)
BUN: 13 mg/dL (ref 7–25)
CO2: 25 mmol/L (ref 20–31)
Calcium: 8.8 mg/dL (ref 8.6–10.4)
Chloride: 105 mmol/L (ref 98–110)
Creat: 0.67 mg/dL (ref 0.50–1.05)
GLUCOSE: 85 mg/dL (ref 65–99)
Potassium: 4.2 mmol/L (ref 3.5–5.3)
Sodium: 137 mmol/L (ref 135–146)
Total Protein: 6.4 g/dL (ref 6.1–8.1)

## 2016-11-22 LAB — CBC
HCT: 40.7 % (ref 35.0–45.0)
Hemoglobin: 13.1 g/dL (ref 11.7–15.5)
MCH: 29.7 pg (ref 27.0–33.0)
MCHC: 32.2 g/dL (ref 32.0–36.0)
MCV: 92.3 fL (ref 80.0–100.0)
MPV: 9 fL (ref 7.5–12.5)
Platelets: 328 10*3/uL (ref 140–400)
RBC: 4.41 MIL/uL (ref 3.80–5.10)
RDW: 12.6 % (ref 11.0–15.0)
WBC: 9.8 10*3/uL (ref 3.8–10.8)

## 2016-11-22 LAB — LIPID PANEL
Cholesterol: 139 mg/dL (ref <200)
HDL: 36 mg/dL — ABNORMAL LOW (ref >50)
LDL Cholesterol: 71 mg/dL (ref <100)
Total CHOL/HDL Ratio: 3.9 Ratio (ref <5.0)
Triglycerides: 162 mg/dL — ABNORMAL HIGH (ref <150)
VLDL: 32 mg/dL — ABNORMAL HIGH (ref <30)

## 2016-11-22 MED ORDER — MOMETASONE FUROATE 50 MCG/ACT NA SUSP: 2.0000 | Freq: Every day | NASAL | 1 refills | Status: DC

## 2016-11-22 MED ORDER — ESCITALOPRAM OXALATE 5 MG PO TABS: 5.0000 mg | ORAL_TABLET | Freq: Every day | ORAL | 2 refills | Status: DC

## 2016-11-22 MED FILL — ESCITALOPRAM 5 MG TABLET: 5 | 30 days supply | Qty: 30 | Fill #0

## 2016-11-22 NOTE — Progress Notes (Signed)
Pre visit review using our clinic review tool, if applicable. No additional management support is needed unless otherwise documented below in the visit note. 

## 2016-11-22 NOTE — Progress Notes (Signed)
Patient ID: Hailey Noble, female   DOB: Feb 02, 1966, 50 y.o.   MRN: [id number]   Subjective:    Patient ID: Hailey Noble, female    DOB: 07/13/1966, 50 y.o.   MRN: [id number]  Chief Complaint  Patient presents with  . Annual Exam    HPI Patient is in today for CPE patient c/o of anxiety only at work. Doing well at home. Notes some anhedonia but no suicidal ideation. No panic attacks but definitely feels overwhelmed by anxious sense at work at times. No recent acute illness or recent hospitalizations. Denies CP/palp/SOB/HA/congestion/fevers/GI or GU c/o. Taking meds as prescribed. Doing well with ADLs at home  Past Medical History:  Diagnosis Date  . Allergic state 12/08/2016  . Allergy   . GERD (gastroesophageal reflux disease)   . Plantar fasciitis   . Sinusitis   . Vitamin D deficiency 11/22/2016    Past Surgical History:  Procedure Laterality Date  . COLONOSCOPY  02-2009   with Henrene Pastor normal  . ESOPHAGOGASTRODUODENOSCOPY  2012  . GANGLION CYST EXCISION Right    right- wrist   . NASAL SEPTUM SURGERY    . NASAL SINUS SURGERY    . TMJ ARTHROSCOPY     left    Family History  Problem Relation Age of Onset  . Hypertension Father   . Diabetes Father   . Breast cancer Mother 42  . Cancer Mother     breast  . Colon cancer Paternal Grandfather     died at age 87  . Cancer Paternal Grandfather     GI CA  . Obesity Sister   . Diabetes Sister   . Hypertension Sister   . Heart disease Maternal Grandmother     MI  . Cancer Maternal Grandfather     lung, smoker  . Diabetes Maternal Grandfather   . Diabetes Paternal Grandmother   . Heart disease Paternal Grandmother   . Pancreatitis Sister   . Esophageal cancer Neg Hx   . Stomach cancer Neg Hx   . Stroke Neg Hx   . Colon polyps Neg Hx   . Rectal cancer Neg Hx   . Pancreatic cancer Neg Hx     Social History   Social History  . Marital status: Single    Spouse name: N/A  . Number of children: N/A  . Years of  education: N/A   Occupational History  . RN [employer]   Social History Main Topics  . Smoking status: Never Smoker  . Smokeless tobacco: Never Used  . Alcohol use 0.0 oz/week     Comment: rare  . Drug use: No  . Sexual activity: Yes    Birth control/ protection: IUD   Other Topics Concern  . 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 Works at [employer]. No dietary restrictions. Lives with dog    Outpatient Medications Prior to Visit  Medication Sig Dispense Refill  . acetaminophen (TYLENOL) 650 MG CR tablet Take 650 mg by mouth every 8 (eight) hours as needed. pain    . Ascorbic Acid (VITAMIN C) 1000 MG tablet Take 1,000 mg by mouth daily.    . Calcium Carbonate-Vit D-Min (CALCIUM 1200 PO) Take by mouth daily.      . cetirizine (ZYRTEC) 10 MG tablet Take 10 mg by mouth daily.    . famotidine (PEPCID) 20 MG tablet Take 20 mg by mouth daily.    Marland Kitchen  hydrOXYzine (ATARAX/VISTARIL) 25 MG tablet Take 1 tablet (25 mg total) by mouth every 4 (four) hours as needed. 30 tablet 0  . ibuprofen (ADVIL,MOTRIN) 200 MG tablet Take 200 mg by mouth every 6 (six) hours as needed. pain    . KRILL OIL 1000 MG CAPS Take 1 capsule by mouth daily.    Vladimir Faster Glycol-Propyl Glycol (SYSTANE FREE OP) Apply 1 drop to eye daily.    . mometasone (NASONEX) 50 MCG/ACT nasal spray Place 2 sprays into the nose daily. 17 g 1  . fluticasone (CUTIVATE) 0.05 % cream Apply topically 2 (two) times daily. To worst areas of rash. (Patient not taking: Reported on 11/22/2016) 30 g 1   No facility-administered medications prior to visit.     Allergies  Allergen Reactions  . Coconut Fatty Acids Hives  . Hydrocod Polst-Cpm Polst Er     REACTION: rash on face and head, itching  . Mango Flavor Hives  . Oxycodone-Acetaminophen     REACTION: rash on face and head, itching  . Tussin Cough [Dextromethorphan Hbr]     Broken out in hives  . Ultram  [Tramadol] Other (See Comments)    Dizzy and low blood pressure.     Review of Systems  Constitutional: Negative for fever.  HENT: Positive for congestion.   Eyes: Negative for blurred vision.  Respiratory: Negative for cough and shortness of breath.   Cardiovascular: Negative for chest pain and palpitations.  Gastrointestinal: Negative for vomiting.  Musculoskeletal: Negative for back pain.  Skin: Negative for rash.  Neurological: Negative for loss of consciousness and headaches.  Psychiatric/Behavioral: The patient is nervous/anxious.        Objective:    Physical Exam  Constitutional: She is oriented to person, place, and time. She appears well-developed and well-nourished. No distress.  HENT:  Head: Normocephalic and atraumatic.  Eyes: Conjunctivae and EOM are normal. Right eye exhibits no discharge.  Neck: Normal range of motion. No thyromegaly present.  Cardiovascular: Normal rate and regular rhythm.   Pulmonary/Chest: Effort normal and breath sounds normal. She has no wheezes.  Abdominal: Soft. Bowel sounds are normal. She exhibits no distension and no mass. There is no tenderness. There is no guarding.  Musculoskeletal: Normal range of motion. She exhibits no edema or deformity.  Lymphadenopathy:    She has no cervical adenopathy.  Neurological: She is alert and oriented to person, place, and time.  Skin: Skin is warm and dry. No rash noted. She is not diaphoretic.  Psychiatric: She has a normal mood and affect.    BP 118/76 (BP Location: Left Arm, Patient Position: Sitting, Cuff Size: Normal)   Pulse 85   Temp 98.4 F (36.9 C) (Oral)   Wt 206 lb 12.8 oz (93.8 kg)   SpO2 95% Comment: RA  BMI 32.39 kg/m  Wt Readings from Last 3 Encounters:  11/22/16 206 lb 12.8 oz (93.8 kg)  05/02/16 214 lb (97.1 kg)  04/15/16 214 lb (97.1 kg)     Lab Results  Component Value Date   WBC 9.8 11/22/2016   HGB 13.1 11/22/2016   HCT 40.7 11/22/2016   PLT 328 11/22/2016    GLUCOSE 85 11/22/2016   CHOL 139 11/22/2016   TRIG 162 (H) 11/22/2016   HDL 36 (L) 11/22/2016   LDLCALC 71 11/22/2016   ALT 12 11/22/2016   AST 13 11/22/2016   NA 137 11/22/2016   K 4.2 11/22/2016   CL 105 11/22/2016   CREATININE 0.67 11/22/2016  BUN 13 11/22/2016   CO2 25 11/22/2016   TSH 1.30 11/22/2016   HGBA1C 5.1 04/06/2014    Lab Results  Component Value Date   TSH 1.30 11/22/2016   Lab Results  Component Value Date   WBC 9.8 11/22/2016   HGB 13.1 11/22/2016   HCT 40.7 11/22/2016   MCV 92.3 11/22/2016   PLT 328 11/22/2016   Lab Results  Component Value Date   NA 137 11/22/2016   K 4.2 11/22/2016   CO2 25 11/22/2016   GLUCOSE 85 11/22/2016   BUN 13 11/22/2016   CREATININE 0.67 11/22/2016   BILITOT 0.3 11/22/2016   ALKPHOS 84 11/22/2016   AST 13 11/22/2016   ALT 12 11/22/2016   PROT 6.4 11/22/2016   ALBUMIN 4.0 11/22/2016   CALCIUM 8.8 11/22/2016   GFR 112.44 05/26/2015   Lab Results  Component Value Date   CHOL 139 11/22/2016   Lab Results  Component Value Date   HDL 36 (L) 11/22/2016   Lab Results  Component Value Date   LDLCALC 71 11/22/2016   Lab Results  Component Value Date   TRIG 162 (H) 11/22/2016   Lab Results  Component Value Date   CHOLHDL 3.9 11/22/2016   Lab Results  Component Value Date   HGBA1C 5.1 04/06/2014   Princess, RMA  Acted as Neurosurgeon for Dr. Abner Greenspan    Assessment & Plan:   Problem List Items Addressed This Visit    General medical examination - Primary    Patient encouraged to maintain heart healthy diet, regular exercise, adequate sleep. Consider daily probiotics. Take medications as prescribed. Given and reviewed copy of ACP documents from [facility] Secretary of State and encouraged to complete and return      Relevant Orders   CBC (Completed)   Comp Met (CMET) (Completed)   Lipid panel (Completed)   TSH (Completed)   Depression with anxiety    Start Lexapro 5 mg due to anxiety at work. Report any concerns.       Obesity (BMI 30-39.9)    Encouraged DASH diet, decrease po intake and increase exercise as tolerated. Needs 7-8 hours of sleep nightly. Avoid trans fats, eat small, frequent meals every 4-5 hours with lean proteins, complex carbs and healthy fats. Minimize simple carbs      Relevant Orders   CBC (Completed)   Comp Met (CMET) (Completed)   Lipid panel (Completed)   TSH (Completed)   Vitamin D deficiency    Takes 1000 IU daily. Labs reveal deficiency. Start on Vitamin D 75051 IU caps, 1 cap po weekly x 12 weeks. Disp #4 with 4 rf. Also take daily Vitamin D over the counter. If already taking a daily supplement increase by 1000 IU daily       Relevant Orders   VITAMIN D 25 Hydroxy (Vit-D Deficiency, Fractures) (Completed)   Allergic state    Doing well, uses meds prn         I am having Ms. Woodrum start on escitalopram. I am also having her maintain her Calcium Carbonate-Vit D-Min (CALCIUM 1200 PO), acetaminophen, ibuprofen, Polyethyl Glycol-Propyl Glycol (SYSTANE FREE OP), Krill Oil, cetirizine, hydrOXYzine, fluticasone, vitamin C, famotidine, Cholecalciferol (VITAMIN D-1000 MAX ST PO), aspirin EC, and mometasone.  Meds ordered this encounter  Medications  . Cholecalciferol (VITAMIN D-1000 MAX ST PO)    Sig: Take 1 tablet by mouth.  Marland Kitchen aspirin EC 81 MG tablet    Sig: Take 81 mg by mouth daily.  . mometasone (NASONEX)  50 MCG/ACT nasal spray    Sig: Place 2 sprays into the nose daily.    Dispense:  17 g    Refill:  1  . escitalopram (LEXAPRO) 5 MG tablet    Sig: Take 1 tablet (5 mg total) by mouth daily.    Dispense:  30 tablet    Refill:  2     Penni Homans, MD

## 2016-11-22 NOTE — Patient Instructions (Signed)

## 2016-11-22 NOTE — Assessment & Plan Note (Addendum)
Takes 1000 IU daily. Labs reveal deficiency. Start on Vitamin D 50000 IU caps, 1 cap po weekly x 12 weeks. Disp #4 with 4 rf. Also take daily Vitamin D over the counter. If already taking a daily supplement increase by 1000 IU daily

## 2016-11-22 NOTE — Assessment & Plan Note (Addendum)
Start Lexapro 5 mg due to anxiety at work. Report any concerns.

## 2016-11-23 LAB — VITAMIN D 25 HYDROXY (VIT D DEFICIENCY, FRACTURES): Vit D, 25-Hydroxy: 23 ng/mL — ABNORMAL LOW (ref 30–100)

## 2016-11-23 LAB — TSH: TSH: 1.3 m[IU]/L

## 2016-11-25 ENCOUNTER — Other Ambulatory Visit: Payer: Self-pay | Admitting: Family Medicine

## 2016-11-25 MED ORDER — VITAMIN D (ERGOCALCIFEROL) 1.25 MG (50000 UNIT) PO CAPS: 50000.0000 [IU] | ORAL_CAPSULE | ORAL | 0 refills | Status: DC

## 2016-11-25 MED FILL — VIT D2 1.25 MG (50,000 UNIT: 1.25 MG | 84 days supply | Qty: 12 | Fill #0

## 2016-12-08 ENCOUNTER — Encounter: Payer: Self-pay | Admitting: Family Medicine

## 2016-12-08 DIAGNOSIS — T7840XA Allergy, unspecified, initial encounter: Secondary | ICD-10-CM

## 2016-12-08 HISTORY — DX: Allergy, unspecified, initial encounter: T78.40XA

## 2016-12-08 NOTE — Assessment & Plan Note (Signed)
Encouraged DASH diet, decrease po intake and increase exercise as tolerated. Needs 7-8 hours of sleep nightly. Avoid trans fats, eat small, frequent meals every 4-5 hours with lean proteins, complex carbs and healthy fats. Minimize simple carbs 

## 2016-12-08 NOTE — Assessment & Plan Note (Signed)
Patient encouraged to maintain heart healthy diet, regular exercise, adequate sleep. Consider daily probiotics. Take medications as prescribed. Given and reviewed copy of ACP documents from [place] Secretary of State and encouraged to complete and return 

## 2016-12-08 NOTE — Assessment & Plan Note (Signed)
Doing well, uses meds prn

## 2016-12-09 DIAGNOSIS — Z803 Family history of malignant neoplasm of breast: Secondary | ICD-10-CM | POA: Diagnosis not present

## 2016-12-09 DIAGNOSIS — Z1231 Encounter for screening mammogram for malignant neoplasm of breast: Secondary | ICD-10-CM | POA: Diagnosis not present

## 2016-12-09 LAB — HM MAMMOGRAPHY

## 2016-12-11 ENCOUNTER — Encounter: Payer: Self-pay | Admitting: Family Medicine

## 2016-12-12 DIAGNOSIS — Z6832 Body mass index (BMI) 32.0-32.9, adult: Secondary | ICD-10-CM | POA: Diagnosis not present

## 2016-12-12 DIAGNOSIS — Z01419 Encounter for gynecological examination (general) (routine) without abnormal findings: Secondary | ICD-10-CM | POA: Diagnosis not present

## 2016-12-12 DIAGNOSIS — Z1389 Encounter for screening for other disorder: Secondary | ICD-10-CM | POA: Diagnosis not present

## 2016-12-12 DIAGNOSIS — R102 Pelvic and perineal pain: Secondary | ICD-10-CM | POA: Diagnosis not present

## 2016-12-12 DIAGNOSIS — Z130 Encounter for screening for diseases of the blood and blood-forming organs and certain disorders involving the immune mechanism: Secondary | ICD-10-CM | POA: Diagnosis not present

## 2016-12-30 MED FILL — AMOX TR-K CLV 875-125 MG TA: 875-125 | 10 days supply | Qty: 20 | Fill #0

## 2017-01-13 MED FILL — ESCITALOPRAM 5 MG TABLET: 5 | 30 days supply | Qty: 30 | Fill #1

## 2017-02-14 MED FILL — ESCITALOPRAM 5 MG TABLET: 5 | 30 days supply | Qty: 30 | Fill #2

## 2017-02-28 ENCOUNTER — Encounter: Payer: Self-pay | Admitting: Family Medicine

## 2017-02-28 ENCOUNTER — Ambulatory Visit (INDEPENDENT_AMBULATORY_CARE_PROVIDER_SITE_OTHER): Payer: 59 | Admitting: Family Medicine

## 2017-02-28 VITALS — BP 120/78 | HR 85 | Temp 98.1°F | Ht 65.75 in | Wt 213.6 lb

## 2017-02-28 DIAGNOSIS — M9906 Segmental and somatic dysfunction of lower extremity: Secondary | ICD-10-CM

## 2017-02-28 NOTE — Progress Notes (Signed)
Musculoskeletal Exam  Patient: Hailey Noble DOB: 10-Apr-1966  DOS: 02/28/2017  SUBJECTIVE:  Chief Complaint:   Chief Complaint  Patient presents with  . Swelling behind (R) knee    noticed pain (tenderness) a week ago and noticed the swelling x 3 days    Hailey Noble is a 51 y.o.  female for evaluation and treatment of R knee pain.   Onset:  3 days ago. Sudden, not related to any injury, but she has been having to sit down at work throughout the day .  Location: R knee Character: Intermittent tenderness, has a hard time describing Severity: 1/10, no current pain Progression of issue:  is unchanged Associated symptoms: posterior knee swelling Treatment: to date has been OTC NSAIDS.   Neurovascular symptoms: no  She mainly is concerned about a blood clot. No recent travel, personal hx of bleeding/clotting disorder, injury, recent prolonged period of inactivity. Father does have a hx of unprovoked DVT x1. Unsure why.  ROS: Musculoskeletal/Extremities: +R knee pain Neurologic: no numbness, tingling no weakness   Past Medical History:  Diagnosis Date  . Allergic state 12/08/2016  . Allergy   . GERD (gastroesophageal reflux disease)   . Plantar fasciitis   . Sinusitis   . Vitamin D deficiency 11/22/2016   Past Surgical History:  Procedure Laterality Date  . COLONOSCOPY  02-2009   with Henrene Pastor normal  . ESOPHAGOGASTRODUODENOSCOPY  2012  . GANGLION CYST EXCISION Right    right- wrist   . NASAL SEPTUM SURGERY    . NASAL SINUS SURGERY    . TMJ ARTHROSCOPY     left   Family History  Problem Relation Age of Onset  . Hypertension Father   . Diabetes Father   . Breast cancer Mother 61  . Cancer Mother     breast  . Colon cancer Paternal Grandfather     died at age 65  . Cancer Paternal Grandfather     GI CA  . Obesity Sister   . Diabetes Sister   . Hypertension Sister   . Heart disease Maternal Grandmother     MI  . Cancer Maternal Grandfather     lung, smoker  .  Diabetes Maternal Grandfather   . Diabetes Paternal Grandmother   . Heart disease Paternal Grandmother   . Pancreatitis Sister   . Esophageal cancer Neg Hx   . Stomach cancer Neg Hx   . Stroke Neg Hx   . Colon polyps Neg Hx   . Rectal cancer Neg Hx   . Pancreatic cancer Neg Hx    Current Outpatient Prescriptions  Medication Sig Dispense Refill  . acetaminophen (TYLENOL) 650 MG CR tablet Take 650 mg by mouth every 8 (eight) hours as needed. pain    . Ascorbic Acid (VITAMIN C) 1000 MG tablet Take 1,000 mg by mouth daily.    Marland Kitchen aspirin EC 81 MG tablet Take 81 mg by mouth daily.    . Calcium Carbonate-Vit D-Min (CALCIUM 1200 PO) Take by mouth daily.      . cetirizine (ZYRTEC) 10 MG tablet Take 10 mg by mouth daily.    . Cholecalciferol (VITAMIN D-1000 MAX ST PO) Take 1 tablet by mouth.    . escitalopram (LEXAPRO) 5 MG tablet Take 1 tablet (5 mg total) by mouth daily. 30 tablet 2  . famotidine (PEPCID) 20 MG tablet Take 20 mg by mouth daily.    . fluticasone (CUTIVATE) 0.05 % cream Apply topically 2 (two) times daily. To worst  areas of rash. (Patient not taking: Reported on 11/22/2016) 30 g 1  . hydrOXYzine (ATARAX/VISTARIL) 25 MG tablet Take 1 tablet (25 mg total) by mouth every 4 (four) hours as needed. 30 tablet 0  . ibuprofen (ADVIL,MOTRIN) 200 MG tablet Take 200 mg by mouth every 6 (six) hours as needed. pain    . KRILL OIL 1000 MG CAPS Take 1 capsule by mouth daily.    . mometasone (NASONEX) 50 MCG/ACT nasal spray Place 2 sprays into the nose daily. 17 g 1  . Polyethyl Glycol-Propyl Glycol (SYSTANE FREE OP) Apply 1 drop to eye daily.     Allergies  Allergen Reactions  . Coconut Fatty Acids Hives  . Hydrocod Polst-Cpm Polst Er     REACTION: rash on face and head, itching  . Mango Flavor Hives  . Oxycodone-Acetaminophen     REACTION: rash on face and head, itching  . Tussin Cough [Dextromethorphan Hbr]     Broken out in hives  . Ultram [Tramadol] Other (See Comments)    Dizzy and  low blood pressure.    Social History   Social History  . Marital status: Single   Occupational History  . RN [employer]   Social History Main Topics  . Smoking status: Never Smoker  . Smokeless tobacco: Never Used  . Alcohol use 0.0 oz/week     Comment: rare  . Drug use: No  . 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 Works at [employer]. No dietary restrictions. Lives with dog    Objective: VITAL SIGNS: BP 120/78 (BP Location: Left Arm, Patient Position: Sitting, Cuff Size: Large)   Pulse 85   Temp 98.1 F (36.7 C) (Oral)   Ht 5' 5.75" (1.67 m)   Wt 213 lb 9.6 oz (96.9 kg)   SpO2 97%   BMI 34.74 kg/m  Constitutional: Well formed, well developed. No acute distress. Thorax & Lungs: No accessory muscle use Extremities: No clubbing. No cyanosis. No edema.  Skin: Warm. Dry. No erythema. No rash.  Musculoskeletal: R knee.   Normal active range of motion: no.   Normal passive range of motion: no Tenderness to palpation: yes- over prox fib head; no calf/popliteal/hamstring TTP Deformity: no Ecchymosis: no Tests positive: None Tests negative: Lachman's, anterior/posterior drawer, McMurray's, Patellar grind, patellar apprehension, varus/valgus stress, Homan's Neurologic: Normal sensory function. No focal deficits noted. DTR's equal and symmetry in LE's. No clonus. Psychiatric: Normal mood. Age appropriate judgment and insight. Alert & oriented x 3.    Assessment:  Somatic dysfunction of right lower extremity  Plan: Fibular head dysfunction, will start conservative. Low likelihood of DVT, discussed this with patient. She has already put in for a standing desk. I think this is a good idea. Avoid aggravating activities. Heat prn. F/u prn. The patient voiced understanding and agreement to the plan.   [place], DO 02/28/17  8:50 AM

## 2017-02-28 NOTE — Progress Notes (Signed)
Pre visit review using our clinic review tool, if applicable. No additional management support is needed unless otherwise documented below in the visit note. 

## 2017-02-28 NOTE — Patient Instructions (Signed)
Heat 10-15 min may be helpful. Keep stretching your lower extremities. Try to avoid prolonged periods of sitting.  If you start to worsen or fail to improve after 3-4 weeks, return to clinic.

## 2017-03-10 ENCOUNTER — Encounter: Payer: Self-pay | Admitting: Family Medicine

## 2017-03-10 ENCOUNTER — Ambulatory Visit (INDEPENDENT_AMBULATORY_CARE_PROVIDER_SITE_OTHER): Payer: 59 | Admitting: Family Medicine

## 2017-03-10 DIAGNOSIS — F418 Other specified anxiety disorders: Secondary | ICD-10-CM

## 2017-03-10 DIAGNOSIS — M25561 Pain in right knee: Secondary | ICD-10-CM | POA: Diagnosis not present

## 2017-03-10 DIAGNOSIS — E559 Vitamin D deficiency, unspecified: Secondary | ICD-10-CM

## 2017-03-10 HISTORY — DX: Pain in right knee: M25.561

## 2017-03-10 LAB — VITAMIN D 25 HYDROXY (VIT D DEFICIENCY, FRACTURES): VITD: 28.74 ng/mL — ABNORMAL LOW (ref 30.00–100.00)

## 2017-03-10 MED ORDER — ESCITALOPRAM OXALATE 10 MG PO TABS: 10.0000 mg | ORAL_TABLET | Freq: Every day | ORAL | 3 refills | Status: DC

## 2017-03-10 MED FILL — ESCITALOPRAM 10 MG TABLET: 10 | 30 days supply | Qty: 30 | Fill #0

## 2017-03-10 NOTE — Assessment & Plan Note (Signed)
Likely a lateral meniscus strain, try ice and topical Salon Pas gel bid if no relief consider a sports med consultation

## 2017-03-10 NOTE — Patient Instructions (Signed)
Meniscus Tear A meniscus tear is a knee injury in which a piece of the meniscus is torn. The meniscus is a thick, rubbery, wedge-shaped cartilage in the knee. Two menisci are located in each knee. They sit between the upper bone (femur) and lower bone (tibia) that make up the knee joint. Each meniscus acts as a shock absorber for the knee. A torn meniscus is one of the most common types of knee injuries. This injury can range from mild to severe. Surgery may be needed for a severe tear. What are the causes? This injury may be caused by any squatting, twisting, or pivoting movement. Sports-related injuries are the most common cause. These often occur from:  Running and stopping suddenly.  Changing direction.  Being tackled or knocked off your feet. As people get older, their meniscus gets thinner and weaker. In these people, tears can happen more easily, such as from climbing stairs. What increases the risk? This injury is more likely to happen to:  People who play contact sports.  Males.  People who are 8?52 years of age. What are the signs or symptoms? Symptoms of this injury include:  Knee pain, especially at the side of the knee joint. You may feel pain when the injury occurs, or you may only hear a pop and feel pain later.  A feeling that your knee is clicking, catching, locking, or giving way.  Not being able to fully bend or extend your knee.  Bruising or swelling in your knee. How is this diagnosed? This injury may be diagnosed based on your symptoms and a physical exam. The physical exam may include:  Moving your knee in different ways.  Feeling for tenderness.  Listening for a clicking sound.  Checking if your knee locks or catches. You may also have tests, such as:  X-rays.  MRI.  A procedure to look inside your knee with a narrow surgical telescope (arthroscopy). You may be referred to a knee specialist (orthopedic surgeon). How is this treated? Treatment  for this injury depends on the severity of the tear. Treatment for a mild tear may include:  Rest.  Medicine to reduce pain and swelling. This is usually a nonsteroidal anti-inflammatory drug (NSAID).  A knee brace or an elastic sleeve or wrap.  Using crutches or a walker to keep weight off your knee and to help you walk.  Exercises to strengthen your knee (physical therapy). You may need surgery if you have a severe tear or if other treatments are not working. Follow these instructions at home: Managing pain and swelling   Take over-the-counter and prescription medicines only as told by your health care provider.  If directed, apply ice to the injured area:  Put ice in a plastic bag.  Place a towel between your skin and the bag.  Leave the ice on for 20 minutes, 2-3 times per day.  Raise (elevate) the injured area above the level of your heart while you are sitting or lying down. Activity   Do not use the injured limb to support your body weight until your health care provider says that you can. Use crutches or a walker as told by your health care provider.  Return to your normal activities as told by your health care provider. Ask your health care provider what activities are safe for you.  Perform range-of-motion exercises only as told by your health care provider.  Begin doing exercises to strengthen your knee and leg muscles only as told by your  health care provider. After you recover, your health care provider may recommend these exercises to help prevent another injury. General instructions   Use a knee brace or elastic wrap as told by your health care provider.  Keep all follow-up visits as told by your health care provider. This is important. Contact a health care provider if:  You have a fever.  Your knee becomes red, tender, or swollen.  Your pain medicine is not helping.  Your symptoms get worse or do not improve after 2 weeks of home care. This  information is not intended to replace advice given to you by your health care provider. Make sure you discuss any questions you have with your health care provider. Document Released: 03/01/2003 Document Revised: 05/16/2016 Document Reviewed: 04/03/2015 Elsevier Interactive Patient Education  2017 [employer].

## 2017-03-10 NOTE — Progress Notes (Signed)
Pre visit review using our clinic review tool, if applicable. No additional management support is needed unless otherwise documented below in the visit note. 

## 2017-03-10 NOTE — Assessment & Plan Note (Signed)
Took 3 months of the hi dose and 2000 IU daily now recheck level on just 2000 IU daily

## 2017-03-10 NOTE — Assessment & Plan Note (Signed)
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

## 2017-03-10 NOTE — Progress Notes (Signed)
Patient ID: Hailey Noble, female   DOB: 1966/03/14, 51 y.o.   MRN: [id number]   Subjective:  I acted as a Education administrator for Penni Homans, [place], [place]   Patient ID: Hailey Noble, female    DOB: Mar 07, 1966, 51 y.o.   MRN: [id number]  Chief Complaint  Patient presents with  . Follow-up    Leg Pain   The incident occurred more than 1 week ago. The injury mechanism is unknown.    Patient is in today for a 31-month follow up. Patient still has a pain in right leg, especially when sitting for an extended period of time.  Patient has a Hx of vitamin d deficiency, depression. Patient has no additional acute concerns noted at this time. 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 No anhedonia but anxiety is noted. Lateral right knee hurts with twisting, no injury, redness or warmth. Aleve helps some temporarily  Patient Care Team: Mosie Lukes, MD as PCP - General (Family Medicine) Morene Crocker, NP as Nurse Practitioner (Gynecology) Paula Compton, MD as Consulting Physician (Obstetrics and Gynecology) Rosemary Holms, DPM as Consulting Physician (Podiatry)   Past Medical History:  Diagnosis Date  . Allergic state 12/08/2016  . Allergy   . GERD (gastroesophageal reflux disease)   . Knee pain, right 03/10/2017  . Plantar fasciitis   . Sinusitis   . Vitamin D deficiency 11/22/2016    Past Surgical History:  Procedure Laterality Date  . COLONOSCOPY  02-2009   with Henrene Pastor normal  . ESOPHAGOGASTRODUODENOSCOPY  2012  . GANGLION CYST EXCISION Right    right- wrist   . NASAL SEPTUM SURGERY    . NASAL SINUS SURGERY    . TMJ ARTHROSCOPY     left    Family History  Problem Relation Age of Onset  . Hypertension Father   . Diabetes Father   . Breast cancer Mother 69  . Cancer Mother     breast  . Colon cancer Paternal Grandfather     died at age 41  . Cancer  Paternal Grandfather     GI CA  . Obesity Sister   . Diabetes Sister   . Hypertension Sister   . Heart disease Maternal Grandmother     MI  . Cancer Maternal Grandfather     lung, smoker  . Diabetes Maternal Grandfather   . Diabetes Paternal Grandmother   . Heart disease Paternal Grandmother   . Pancreatitis Sister   . Esophageal cancer Neg Hx   . Stomach cancer Neg Hx   . Stroke Neg Hx   . Colon polyps Neg Hx   . Rectal cancer Neg Hx   . Pancreatic cancer Neg Hx     Social History   Social History  . Marital status: Single    Spouse name: N/A  . Number of children: N/A  . Years of education: N/A   Occupational History  . RN [employer]   Social History Main Topics  . Smoking status: Never Smoker  . Smokeless tobacco: Never Used  . Alcohol use 0.0 oz/week     Comment: rare  . Drug use: No  . Sexual activity: Yes    Birth control/ protection: IUD   Other Topics Concern  . 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 Works at  [employer]. No dietary restrictions. Lives with dog    Outpatient Medications Prior to Visit  Medication Sig Dispense Refill  . acetaminophen (TYLENOL) 650 MG CR tablet Take 650 mg by mouth every 8 (eight) hours as needed. pain    . Ascorbic Acid (VITAMIN C) 1000 MG tablet Take 1,000 mg by mouth daily.    Marland Kitchen aspirin EC 81 MG tablet Take 81 mg by mouth daily.    . Calcium Carbonate-Vit D-Min (CALCIUM 1200 PO) Take by mouth daily.      . cetirizine (ZYRTEC) 10 MG tablet Take 10 mg by mouth daily.    . Cholecalciferol (VITAMIN D-1000 MAX ST PO) Take 1 tablet by mouth.    . escitalopram (LEXAPRO) 5 MG tablet Take 1 tablet (5 mg total) by mouth daily. 30 tablet 2  . famotidine (PEPCID) 20 MG tablet Take 20 mg by mouth daily.    . fluticasone (CUTIVATE) 0.05 % cream Apply topically 2 (two) times daily. To worst areas of rash. 30 g 1  . hydrOXYzine (ATARAX/VISTARIL) 25 MG  tablet Take 1 tablet (25 mg total) by mouth every 4 (four) hours as needed. 30 tablet 0  . ibuprofen (ADVIL,MOTRIN) 200 MG tablet Take 200 mg by mouth every 6 (six) hours as needed. pain    . KRILL OIL 1000 MG CAPS Take 1 capsule by mouth daily.    . mometasone (NASONEX) 50 MCG/ACT nasal spray Place 2 sprays into the nose daily. 17 g 1  . Polyethyl Glycol-Propyl Glycol (SYSTANE FREE OP) Apply 1 drop to eye daily.     No facility-administered medications prior to visit.     Allergies  Allergen Reactions  . Coconut Fatty Acids Hives  . Hydrocod Polst-Cpm Polst Er     REACTION: rash on face and head, itching  . Mango Flavor Hives  . Oxycodone-Acetaminophen     REACTION: rash on face and head, itching  . Tussin Cough [Dextromethorphan Hbr]     Broken out in hives  . Ultram [Tramadol] Other (See Comments)    Dizzy and low blood pressure.     Review of Systems  Constitutional: Negative for fever and malaise/fatigue.  HENT: Negative for congestion.   Eyes: Negative for blurred vision.  Respiratory: Negative for cough and shortness of breath.   Cardiovascular: Negative for chest pain, palpitations and leg swelling.  Gastrointestinal: Negative for vomiting.  Musculoskeletal: Positive for joint pain. Negative for back pain.  Skin: Negative for rash.  Neurological: Negative for loss of consciousness and headaches.  Psychiatric/Behavioral: The patient is nervous/anxious.        Objective:    Physical Exam  Constitutional: She is oriented to person, place, and time. She appears well-developed and well-nourished. No distress.  HENT:  Head: Normocephalic and atraumatic.  Eyes: Conjunctivae are normal.  Neck: Normal range of motion. No thyromegaly present.  Cardiovascular: Normal rate and regular rhythm.   Pulmonary/Chest: Effort normal and breath sounds normal. She has no wheezes.  Abdominal: Soft. Bowel sounds are normal. There is no tenderness.  Musculoskeletal: She exhibits no  edema or deformity.  Neurological: She is alert and oriented to person, place, and time.  Skin: Skin is warm and dry. She is not diaphoretic.  Psychiatric: She has a normal mood and affect.    BP (!) 130/48 (BP Location: Left Arm, Patient Position: Sitting, Cuff Size: Large)   Pulse 74   Temp 98.4 F (36.9 C) (Oral)   Wt 214 lb (97.1 kg)  SpO2 95% Comment: RA  BMI 34.80 kg/m  Wt Readings from Last 3 Encounters:  03/10/17 214 lb (97.1 kg)  02/28/17 213 lb 9.6 oz (96.9 kg)  11/22/16 206 lb 12.8 oz (93.8 kg)   BP Readings from Last 3 Encounters:  03/10/17 (!) 130/48  02/28/17 120/78  11/22/16 118/76     Immunization History  Administered Date(s) Administered  . Influenza Split 09/22/2012, 10/01/2013  . Influenza,inj,Quad PF,36+ Mos 09/22/2014  . Tdap 04/06/2014    Health Maintenance  Topic Date Due  . HIV Screening  05/02/1980  . MAMMOGRAM  12/09/2017  . PAP SMEAR  11/25/2018  . COLONOSCOPY  05/02/2021  . TETANUS/TDAP  04/06/2024  . INFLUENZA VACCINE  Addressed    Lab Results  Component Value Date   WBC 9.8 11/22/2016   HGB 13.1 11/22/2016   HCT 40.7 11/22/2016   PLT 328 11/22/2016   GLUCOSE 85 11/22/2016   CHOL 139 11/22/2016   TRIG 162 (H) 11/22/2016   HDL 36 (L) 11/22/2016   LDLCALC 71 11/22/2016   ALT 12 11/22/2016   AST 13 11/22/2016   NA 137 11/22/2016   K 4.2 11/22/2016   CL 105 11/22/2016   CREATININE 0.67 11/22/2016   BUN 13 11/22/2016   CO2 25 11/22/2016   TSH 1.30 11/22/2016   HGBA1C 5.1 04/06/2014    Lab Results  Component Value Date   TSH 1.30 11/22/2016   Lab Results  Component Value Date   WBC 9.8 11/22/2016   HGB 13.1 11/22/2016   HCT 40.7 11/22/2016   MCV 92.3 11/22/2016   PLT 328 11/22/2016   Lab Results  Component Value Date   NA 137 11/22/2016   K 4.2 11/22/2016   CO2 25 11/22/2016   GLUCOSE 85 11/22/2016   BUN 13 11/22/2016   CREATININE 0.67 11/22/2016   BILITOT 0.3 11/22/2016   ALKPHOS 84 11/22/2016   AST 13  11/22/2016   ALT 12 11/22/2016   PROT 6.4 11/22/2016   ALBUMIN 4.0 11/22/2016   CALCIUM 8.8 11/22/2016   GFR 112.44 05/26/2015   Lab Results  Component Value Date   CHOL 139 11/22/2016   Lab Results  Component Value Date   HDL 36 (L) 11/22/2016   Lab Results  Component Value Date   LDLCALC 71 11/22/2016   Lab Results  Component Value Date   TRIG 162 (H) 11/22/2016   Lab Results  Component Value Date   CHOLHDL 3.9 11/22/2016   Lab Results  Component Value Date   HGBA1C 5.1 04/06/2014         Assessment & Plan:   Problem List Items Addressed This Visit    Depression with anxiety    No obvious side effects but no obvious improvement, 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 Wilensky maintain her Calcium Carbonate-Vit D-Min (CALCIUM 1200 PO), acetaminophen, ibuprofen, Polyethyl Glycol-Propyl Glycol (SYSTANE FREE OP), Krill Oil, cetirizine,  hydrOXYzine, fluticasone, vitamin C, famotidine, Cholecalciferol (VITAMIN D-1000 MAX ST PO), aspirin EC, mometasone, and escitalopram.  No orders of the defined types were placed in this encounter.   CMA served as Education administrator during this visit. History, Physical and Plan performed by medical provider. Documentation and orders reviewed and attested to.  Penni Homans, MD

## 2017-04-07 MED FILL — ESCITALOPRAM 10 MG TABLET: 10 | 30 days supply | Qty: 30 | Fill #1

## 2017-04-08 ENCOUNTER — Telehealth: Payer: Self-pay | Admitting: Family Medicine

## 2017-04-08 NOTE — Telephone Encounter (Signed)
Caller name: Shaterria Relation to pt: self  Call back number: [phone] Pharmacy:  Reason for call: Pt states was see in our office on 03-10-2017 and still is having some issues with her right knee, pt would like to know if she can get a MRI for right knee or if she needs a referral to sport medicine, pt stating still having nerve pain. Please advise.

## 2017-04-08 NOTE — Telephone Encounter (Signed)
I would have her see sports med they can help her with treatment and get any images they may need. If she agrees please place referral for right knee pain

## 2017-04-09 ENCOUNTER — Other Ambulatory Visit: Payer: Self-pay | Admitting: Family Medicine

## 2017-04-09 DIAGNOSIS — M25561 Pain in right knee: Secondary | ICD-10-CM

## 2017-04-09 NOTE — Telephone Encounter (Signed)
Patient informed of PCP instructions and agreed to referral. Referral placed.

## 2017-04-14 ENCOUNTER — Ambulatory Visit (INDEPENDENT_AMBULATORY_CARE_PROVIDER_SITE_OTHER): Payer: 59 | Admitting: Family Medicine

## 2017-04-14 ENCOUNTER — Encounter: Payer: Self-pay | Admitting: Family Medicine

## 2017-04-14 DIAGNOSIS — M25561 Pain in right knee: Secondary | ICD-10-CM

## 2017-04-14 NOTE — Patient Instructions (Signed)
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 These typically take at least 6-8 weeks to completely heal. The rest of your exam is normal and very reassuring. Start physical therapy for 2-3 visits then do home exercises on days you don't go to therapy. Avoid deep squats, lunges, leg press. Avoid icing over this area. Medicines are unlikely to be beneficial. Sleeve typically will help with support of the fibular head (would wear during the day). If not improving we can consider imaging though most improve with conservative measures without having to go that route. Follow up with me in 1 month to 6 weeks.

## 2017-04-14 NOTE — Progress Notes (Signed)
PCP: Penni Homans, MD  Subjective:   HPI: Patient is a 51 y.o. female here for right knee injury.  Patient reports about 1 month ago she was in bed doing usual stretches, rotated and felt a warm sensation lateral aspect of right knee. Associated burning with this. Since then she will get pain if flexing the knee especially past 90 degrees or when driving. Pain level 0/10 currently. No skin changes. Questionable swelling behind the knee. Does get some intermittent tingling into right foot.  Past Medical History:  Diagnosis Date  . Allergic state 12/08/2016  . Allergy   . GERD (gastroesophageal reflux disease)   . Knee pain, right 03/10/2017  . Plantar fasciitis   . Sinusitis   . Vitamin D deficiency 11/22/2016    Current Outpatient Prescriptions on File Prior to Visit  Medication Sig Dispense Refill  . acetaminophen (TYLENOL) 650 MG CR tablet Take 650 mg by mouth every 8 (eight) hours as needed. pain    . Ascorbic Acid (VITAMIN C) 1000 MG tablet Take 1,000 mg by mouth daily.    Marland Kitchen aspirin EC 81 MG tablet Take 81 mg by mouth daily.    . Calcium Carbonate-Vit D-Min (CALCIUM 1200 PO) Take by mouth daily.      . cetirizine (ZYRTEC) 10 MG tablet Take 10 mg by mouth daily.    . Cholecalciferol (VITAMIN D-1000 MAX ST PO) Take 1 tablet by mouth.    . escitalopram (LEXAPRO) 10 MG tablet Take 1 tablet (10 mg total) by mouth daily. 30 tablet 3  . famotidine (PEPCID) 20 MG tablet Take 20 mg by mouth daily.    . fluticasone (CUTIVATE) 0.05 % cream Apply topically 2 (two) times daily. To worst areas of rash. 30 g 1  . hydrOXYzine (ATARAX/VISTARIL) 25 MG tablet Take 1 tablet (25 mg total) by mouth every 4 (four) hours as needed. 30 tablet 0  . ibuprofen (ADVIL,MOTRIN) 200 MG tablet Take 200 mg by mouth every 6 (six) hours as needed. pain    . KRILL OIL 1000 MG CAPS Take 1 capsule by mouth daily.    . mometasone (NASONEX) 50 MCG/ACT nasal spray Place 2 sprays into the nose daily. 17 g 1  .  Polyethyl Glycol-Propyl Glycol (SYSTANE FREE OP) Apply 1 drop to eye daily.     No current facility-administered medications on file prior to visit.     Past Surgical History:  Procedure Laterality Date  . COLONOSCOPY  02-2009   with Henrene Pastor normal  . ESOPHAGOGASTRODUODENOSCOPY  2012  . GANGLION CYST EXCISION Right    right- wrist   . NASAL SEPTUM SURGERY    . NASAL SINUS SURGERY    . TMJ ARTHROSCOPY     left    Allergies  Allergen Reactions  . Coconut Fatty Acids Hives  . Hydrocod Polst-Cpm Polst Er     REACTION: rash on face and head, itching  . Mango Flavor Hives  . Oxycodone-Acetaminophen     REACTION: rash on face and head, itching  . Tussin Cough [Dextromethorphan Hbr]     Broken out in hives  . Ultram [Tramadol] Other (See Comments)    Dizzy and low blood pressure.     Social History   Social History  . Marital status: Single    Spouse name: N/A  . Number of children: N/A  . Years of education: N/A   Occupational History  . RN [employer]   Social History Main Topics  . Smoking status: Never Smoker  .  Smokeless tobacco: Never Used  . Alcohol use 0.0 oz/week     Comment: rare  . Drug use: No  . Sexual activity: Yes    Birth control/ protection: IUD   Other Topics Concern  . 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 Works at [employer]. No dietary restrictions. Lives with dog    Family History  Problem Relation Age of Onset  . Hypertension Father   . Diabetes Father   . Breast cancer Mother 68  . Cancer Mother     breast  . Colon cancer Paternal Grandfather     died at age 71  . Cancer Paternal Grandfather     GI CA  . Obesity Sister   . Diabetes Sister   . Hypertension Sister   . Heart disease Maternal Grandmother     MI  . Cancer Maternal Grandfather     lung, smoker  . Diabetes Maternal Grandfather   . Diabetes Paternal Grandmother   . Heart disease Paternal  Grandmother   . Pancreatitis Sister   . Esophageal cancer Neg Hx   . Stomach cancer Neg Hx   . Stroke Neg Hx   . Colon polyps Neg Hx   . Rectal cancer Neg Hx   . Pancreatic cancer Neg Hx     BP 120/75   Pulse 89   Ht 5\' 7"  (1.702 m)   Wt 205 lb (93 kg)   BMI 32.11 kg/m   Review of Systems: See HPI above.     Objective:  Physical Exam:  Gen: NAD, comfortable in exam room  Right knee: Mild hypermobility of fibular head - slightly more than left knee.  No other deformity, swelling, bruising. No TTP. FROM.  Strength 5/5 ankle dorsiflexion and plantarflexion Negative ant/post drawers. Negative valgus/varus testing. Negative lachmanns. Negative mcmurrays, apleys, patellar apprehension. Negative tinels at fibular head. NV intact distally.  Left knee: FROM without pain.   Assessment & Plan:  1. 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  Reassured her - rest of exam is normal, benign.  Avoid deep squats, lunges, leg press.  Avoid icing over the area as well - will delay nerve recovery.  No evidence foot drop.  Start physical therapy as well to work on strengthening of lower extremity - may only need 2-3 visits and transition to doing these at home.  Compression sleeve to help with support of fibular head.  F/u in 1 month to 6 weeks.

## 2017-04-14 NOTE — Assessment & Plan Note (Signed)
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  Reassured her - rest of exam is normal, benign.  Avoid deep squats, lunges, leg press.  Avoid icing over the area as well - will delay nerve recovery.  No evidence foot drop.  Start physical therapy as well to work on strengthening of lower extremity - may only need 2-3 visits and transition to doing these at home.  Compression sleeve to help with support of fibular head.  F/u in 1 month to 6 weeks.

## 2017-04-15 ENCOUNTER — Ambulatory Visit: Payer: 59 | Attending: Family Medicine | Admitting: Physical Therapy

## 2017-04-15 DIAGNOSIS — M6281 Muscle weakness (generalized): Secondary | ICD-10-CM | POA: Insufficient documentation

## 2017-04-15 DIAGNOSIS — M25561 Pain in right knee: Secondary | ICD-10-CM | POA: Diagnosis not present

## 2017-04-15 DIAGNOSIS — R262 Difficulty in walking, not elsewhere classified: Secondary | ICD-10-CM | POA: Insufficient documentation

## 2017-04-16 NOTE — Therapy (Signed)
[facility] [address]  [place], [place], [address] Phone: [phone]   Fax:  [phone]  Physical Therapy Evaluation  Patient Details  Name: Hailey Noble MRN: [id number] Date of Birth: [birth year] Referring Provider: Karlton Lemon, MD  Encounter Date: 04/15/2017      PT End of Session - 04/15/17 1628    Visit Number 1   Number of Visits 12   Date for PT Re-Evaluation 05/30/17   PT Start Time 2637   PT Stop Time 1628   PT Time Calculation (min) 53 min   Activity Tolerance Patient tolerated treatment well   Behavior During Therapy [facility] for tasks assessed/performed      Past Medical History:  Diagnosis Date  . Allergic state 12/08/2016  . Allergy   . GERD (gastroesophageal reflux disease)   . Knee pain, right 03/10/2017  . Plantar fasciitis   . Sinusitis   . Vitamin D deficiency 11/22/2016    Past Surgical History:  Procedure Laterality Date  . COLONOSCOPY  02-2009   with Henrene Pastor normal  . ESOPHAGOGASTRODUODENOSCOPY  2012  . GANGLION CYST EXCISION Right    right- wrist   . NASAL SEPTUM SURGERY    . NASAL SINUS SURGERY    . TMJ ARTHROSCOPY     left    There were no vitals filed for this visit.       Subjective Assessment - 04/15/17 1540    Subjective Pt reports she was stretching her R LE on 02/24/17 and rotated her leg laterally - no pain at the time but felt sensation of warmth on lateral posterior knee. Was initially concerned that it was a DVT (ruled out) or a Baker's cyst, but MD feels it was a stretch injury of common peroneal nerve.   Patient Stated Goals "get the nerve back in place & make the tingling go away"   Currently in Pain? No/denies   Pain Score 0-No pain  brief pain up to 2-3/10   Pain Location Knee   Pain Orientation Right;Lateral   Pain Descriptors / Indicators Sharp;Tingling   Pain Type Neuropathic pain   Pain Radiating Towards n/a   Pain Onset More than a month ago   Pain  Frequency Intermittent   Aggravating Factors  pressure on brake pedal while driving, leaning with all her weight on R LE, squatting   Pain Relieving Factors repositioning   Effect of Pain on Daily Activities unable to maintain pressure on brake pedal while stopped at light while driving (has to put car in park)            [facility] PT Assessment - 04/15/17 1535      Assessment   Medical Diagnosis R knee pain, fibular head hypermobility, stretch injury of common peroneal nerve   Referring Provider Karlton Lemon, MD   Onset Date/Surgical Date 02/24/17   Next MD Visit 05/14/17   Prior Therapy remote h/o PT for low back     Precautions   Required Braces or Orthoses Other Brace/Splint   Other Brace/Splint pt reports MD recommended compression sleeve but pt was not given one - pt plans to f/u with MD re: obtaining sleeve     Balance Screen   Has the patient fallen in the past 6 months No   Has the patient had a decrease in activity level because of a fear of falling?  Yes   Is the patient reluctant to leave their home because of a fear  of falling?  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     Observation/Other Assessments   Focus on Therapeutic Outcomes (FOTO)  Knee - 55% (45% limitation); predicted 69% (31% limitation)     ROM / Strength   AROM / PROM / Strength AROM;Strength     AROM   Overall AROM Comments B ankle ROM WNL   AROM Assessment Site Knee;Ankle   Right/Left Knee Right;Left    Right Knee Extension 0   Right Knee Flexion 128   Left Knee Extension 0   Left Knee Flexion 134     Strength   Strength Assessment Site Hip;Knee;Ankle   Right/Left Hip Right;Left   Right Hip Flexion 4-/5   Right Hip Extension 4/5   Right Hip External Rotation  4-/5   Right Hip Internal Rotation 4/5   Right Hip ABduction 4/5   Right Hip ADduction 4/5   Left Hip Flexion 4+/5   Left Hip Extension 4/5   Left Hip External Rotation 4+/5   Left Hip Internal Rotation 4+/5   Left Hip ABduction 4+/5   Left Hip ADduction 4+/5   Right/Left Knee Right;Left   Right Knee Flexion 4-/5   Right Knee Extension 4/5  pain at fibular head with resistance   Left Knee Flexion 4+/5   Left Knee Extension 5/5   Right/Left Ankle Right;Left   Right Ankle Dorsiflexion 5/5   Right Ankle Plantar Flexion 3+/5  pain at fibular head with attempt at SLS heel raise   Right Ankle Inversion 5/5   Right Ankle Eversion 4+/5   Left Ankle Dorsiflexion 5/5   Left Ankle Plantar Flexion 5/5   Left Ankle Inversion 5/5   Left Ankle Eversion 5/5     Flexibility   Soft Tissue Assessment /Muscle Length yes   Quadriceps mildly tight in R RF   ITB moderately tight on R, mildly tight on L   Piriformis mild-moderately tight on R                   [facility] Adult PT Treatment/Exercise - 04/15/17 1535      Exercises   Exercises Knee/Hip     Knee/Hip Exercises: Stretches   Hip Flexor Stretch Right;30 seconds;2 reps   Hip Flexor Stretch Limitations mod thomas with strap   ITB Stretch Right;30 seconds;2 reps   ITB Stretch Limitations supine with strap   Piriformis Stretch Right;30 seconds;2 reps   Piriformis Stretch Limitations KTOS (figure 4 deferred d/t strain at lateral knee)     Knee/Hip Exercises: Supine   Bridges with Clamshell Both;10 reps;Strengthening  3-5" hold + hip ABD/ER isometric with green TB   Straight Leg Raises Right;10 reps  3-5" hold   Other Supine Knee/Hip Exercises Hooklying alt hip  ABD/ER with green TB 10x5"                PT Education - 04/15/17 1625    Education provided Yes  Education Details PT eval findings, POC & initial HEP   Person(s) Educated Patient   Methods Explanation;Demonstration;Handout   Comprehension Verbalized understanding;Returned demonstration;Need further instruction             PT Long Term Goals - 04/15/17 1646      PT LONG TERM GOAL #1   Title Independent with HEP by 05/30/17   Status New     PT LONG TERM GOAL #2   Title R LE strength >/= 4+/5 by 05/30/17   Status New     PT LONG TERM GOAL #3   Title Pt will report ability to maintain pressure on brake pedal while driving w/o increased R knee pain by 05/30/17   Status New     PT LONG TERM GOAL #4   Title Pt will ascend/descend strairs reciprocally with no evidence of instability on R LE by 05/30/17   Status New               Plan - 04/15/17 1636    Clinical Impression Statement Hailey Noble is a 51 y/o female who presents to OP PT for a low complexity eval for R lateral knee pain associated with hypermobility of the fibular head and a stretch injury of the common peroneal nerve. Pt denies pain at time of eval, but states pain most common when excessive pressure applied through R LE such as with full weight shift to R LE in standing or applying pressure on the brake pedal while driving. Assessment reveals R knee lacking 6 dg of flexion vs L (128 dg vs 134 dg), but full extension available in both knees although tightness present in ITB, RF and piriformis, R>L.  Mild strength deficits noted in R LE vs L with pt unable to perform SLS heel raise on R d/t pain at fibular head. Pain and weakness contribute to R LE instability with pt perceiving that R knee feels prone to buckling, although denies any actual instance where knee has buckled. Pt demonstrates good potential to benefit from skilled PT focusing on improving LE soft tissue pliability, increasing R knee flexion ROM, core/LE  strengthening and stability training, gait training and balance/proprioceptive training to reduce sensation of R knee buckling, with manual therapy as indicated for ROM/pain/edema and modalities including iontophoresis and vasopnuematic compression PRN for pain/edema.   Rehab Potential Good   PT Frequency 2x / week   PT Duration 6 weeks   PT Treatment/Interventions Patient/family education;Therapeutic exercise;Therapeutic activities;Neuromuscular re-education;Balance training;Manual techniques;Taping;Iontophoresis 4mg /ml Dexamethasone;Electrical Stimulation;Cryotherapy;Vasopneumatic Device;Gait training;Stair training;ADLs/Self Care Home Management   PT Next Visit Plan Review initial HEP, R LE strengthening/proprioceptive training, manual therapy and modalities including ionto (if approved by MD) PRN for pain   Consulted and Agree with Plan of Care Patient      Patient will benefit from skilled therapeutic intervention in order to improve the following deficits and impairments:  Pain, Decreased range of motion, Decreased strength, Impaired flexibility, Difficulty walking, Hypermobility, Decreased activity tolerance  Visit Diagnosis: Acute pain of right knee  Difficulty in walking, not elsewhere classified  Muscle weakness (generalized)     Problem List Patient Active Problem List   Diagnosis Date Noted  . Knee pain, right 03/10/2017  . Allergic state 12/08/2016  . Vitamin D deficiency 11/22/2016  . Depression with anxiety 04/06/2014  . Obesity (BMI 30-39.9) 04/06/2014  . General medical examination 06/17/2011  . INGUINAL PAIN, LEFT 12/05/2008    Percival Spanish, PT, MPT 04/16/2017, 1:37 PM  [facility] [address]  [place], [place], [address] Phone: [phone]   Fax:  [phone]  Name: Hailey Noble MRN: [id number] Date of Birth: February 23, 1966

## 2017-04-21 ENCOUNTER — Ambulatory Visit: Payer: 59 | Admitting: Physical Therapy

## 2017-04-21 DIAGNOSIS — R262 Difficulty in walking, not elsewhere classified: Secondary | ICD-10-CM | POA: Diagnosis not present

## 2017-04-21 DIAGNOSIS — M25561 Pain in right knee: Secondary | ICD-10-CM

## 2017-04-21 DIAGNOSIS — M6281 Muscle weakness (generalized): Secondary | ICD-10-CM | POA: Diagnosis not present

## 2017-04-21 NOTE — Therapy (Signed)
[facility] [address]  [place], [place], [address] Phone: [phone]   Fax:  [phone]  Physical Therapy Treatment  Patient Details  Name: Hailey Noble MRN: [id number] Date of Birth: 12-Aug-1966 Referring Provider: Karlton Lemon, MD  Encounter Date: 04/21/2017      PT End of Session - 04/21/17 0800    Visit Number 2   Number of Visits 12   Date for PT Re-Evaluation 05/30/17   PT Start Time 0800   PT Stop Time 0840   PT Time Calculation (min) 40 min   Activity Tolerance Patient tolerated treatment well   Behavior During Therapy [facility] for tasks assessed/performed      Past Medical History:  Diagnosis Date  . Allergic state 12/08/2016  . Allergy   . GERD (gastroesophageal reflux disease)   . Knee pain, right 03/10/2017  . Plantar fasciitis   . Sinusitis   . Vitamin D deficiency 11/22/2016    Past Surgical History:  Procedure Laterality Date  . COLONOSCOPY  02-2009   with Henrene Pastor normal  . ESOPHAGOGASTRODUODENOSCOPY  2012  . GANGLION CYST EXCISION Right    right- wrist   . NASAL SEPTUM SURGERY    . NASAL SINUS SURGERY    . TMJ ARTHROSCOPY     left    There were no vitals filed for this visit.      Subjective Assessment - 04/21/17 0803    Subjective Pt with questions about knee brace provided by MD and requested PT look at brace with her.   Patient Stated Goals "get the nerve back in place & make the tingling go away"   Currently in Pain? No/denies   Pain Onset More than a month ago                         [facility] Adult PT Treatment/Exercise - 04/21/17 0800      Self-Care   Self-Care Other Self-Care Comments   Other Self-Care Comments  Instructed pt in foam rolling techniques for lateral quad, lateral HS & ITB     Knee/Hip Exercises: Stretches   Hip Flexor Stretch Right;30 seconds;2 reps   Hip Flexor Stretch Limitations mod thomas with strap   ITB Stretch Right;30 seconds;2  reps   ITB Stretch Limitations supine with strap   Piriformis Stretch Right;30 seconds;2 reps   Piriformis Stretch Limitations KTOS   Other Knee/Hip Stretches R LE foam rolling for lateral quad, lateral HS & ITB x2'     Knee/Hip Exercises: Aerobic   Recumbent Bike lvl 2 x 6'     Knee/Hip Exercises: Supine   Bridges with Clamshell Both;Strengthening;15 reps  3-5" hold + hip ABD/ER isometric with green TB   Bridges with Clamshell Both;15 reps  + alt hip ABD/ ER with green TB   Straight Leg Raises Right;15 reps  3-5" hold   Straight Leg Raises Limitations 2#   Other Supine Knee/Hip Exercises Hooklying alt hip ABD/ER with green TB 15x5"     Manual Therapy   Manual Therapy Soft tissue mobilization;Myofascial release   Manual therapy comments pt sidelying with lower leg on bolster   Soft tissue mobilization strumming to R ITB   Myofascial Release foam roller & beaded roller stick to R lateral quad, lateral HS & ITB                PT Education - 04/21/17 0841    Education provided Yes  Education Details ITB foam rolling   Person(s) Educated Patient   Methods Explanation;Demonstration;Handout   Comprehension Verbalized understanding;Returned demonstration             PT Long Term Goals - 04/21/17 0805      PT LONG TERM GOAL #1   Title Independent with HEP by 05/30/17   Status On-going     PT LONG TERM GOAL #2   Title R LE strength >/= 4+/5 by 05/30/17   Status On-going     PT LONG TERM GOAL #3   Title Pt will report ability to maintain pressure on brake pedal while driving w/o increased R knee pain by 05/30/17   Status On-going     PT LONG TERM GOAL #4   Title Pt will ascend/descend strairs reciprocally with no evidence of instability on R LE by 05/30/17   Status On-going               Plan - 04/21/17 0806    Clinical Impression Statement HEP reviewed with pt requiring several cues for correct technique, hold times and to minimize substitution, but able  to perform exercises correctly after cueing. Continued tightness noted in R lateral quads, HS and ITB, therefore targeted STM/MFR to this area and instructed pt in foam rolling for HEP. Pt already noting improvement in pain with sit to stand and braking while driving.   Rehab Potential Good   PT Treatment/Interventions Patient/family education;Therapeutic exercise;Therapeutic activities;Neuromuscular re-education;Balance training;Manual techniques;Taping;Iontophoresis 4mg /ml Dexamethasone;Electrical Stimulation;Cryotherapy;Vasopneumatic Device;Gait training;Stair training;ADLs/Self Care Home Management   PT Next Visit Plan R LE strengthening/proprioceptive training, manual therapy and modalities including ionto (if approved by MD) PRN for pain   Consulted and Agree with Plan of Care Patient      Patient will benefit from skilled therapeutic intervention in order to improve the following deficits and impairments:  Pain, Decreased range of motion, Decreased strength, Impaired flexibility, Difficulty walking, Hypermobility, Decreased activity tolerance  Visit Diagnosis: Acute pain of right knee  Difficulty in walking, not elsewhere classified  Muscle weakness (generalized)     Problem List Patient Active Problem List   Diagnosis Date Noted  . Knee pain, right 03/10/2017  . Allergic state 12/08/2016  . Vitamin D deficiency 11/22/2016  . Depression with anxiety 04/06/2014  . Obesity (BMI 30-39.9) 04/06/2014  . General medical examination 06/17/2011  . INGUINAL PAIN, LEFT 12/05/2008    Percival Spanish, PT, MPT 04/21/2017, 9:08 AM  [facility] [address]  [place], [place], [address] Phone: [phone]   Fax:  [phone]  Name: RINDY KOLLMAN MRN: [id number] Date of Birth: 06-30-1966

## 2017-04-25 ENCOUNTER — Ambulatory Visit: Payer: 59 | Attending: Family Medicine

## 2017-04-25 ENCOUNTER — Telehealth: Payer: Self-pay | Admitting: Family Medicine

## 2017-04-25 DIAGNOSIS — R262 Difficulty in walking, not elsewhere classified: Secondary | ICD-10-CM | POA: Insufficient documentation

## 2017-04-25 DIAGNOSIS — M25561 Pain in right knee: Secondary | ICD-10-CM | POA: Insufficient documentation

## 2017-04-25 DIAGNOSIS — M6281 Muscle weakness (generalized): Secondary | ICD-10-CM | POA: Insufficient documentation

## 2017-04-25 NOTE — Patient Instructions (Signed)

## 2017-04-25 NOTE — Telephone Encounter (Signed)
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 She said she is going back to work on Monday, May 7th.

## 2017-04-25 NOTE — Therapy (Signed)
[facility] [place] [address]  [place], [place], [address] Phone: [phone]   Fax:  [phone]  Physical Therapy Treatment  Patient Details  Name: Hailey Noble MRN: [id number] Date of Birth: 1966-10-29 Referring Provider: Karlton Lemon, MD  Encounter Date: 04/25/2017      PT End of Session - 04/25/17 0934    Visit Number 3   Number of Visits 12   Date for PT Re-Evaluation 05/30/17   PT Start Time 0931   PT Stop Time 1015   PT Time Calculation (min) 44 min   Activity Tolerance Patient tolerated treatment well   Behavior During Therapy [facility] for tasks assessed/performed      Past Medical History:  Diagnosis Date  . Allergic state 12/08/2016  . Allergy   . GERD (gastroesophageal reflux disease)   . Knee pain, right 03/10/2017  . Plantar fasciitis   . Sinusitis   . Vitamin D deficiency 11/22/2016    Past Surgical History:  Procedure Laterality Date  . COLONOSCOPY  02-2009   with Henrene Pastor normal  . ESOPHAGOGASTRODUODENOSCOPY  2012  . GANGLION CYST EXCISION Right    right- wrist   . NASAL SEPTUM SURGERY    . NASAL SINUS SURGERY    . TMJ ARTHROSCOPY     left    There were no vitals filed for this visit.      Subjective Assessment - 04/25/17 0934    Subjective Pt. seen in treatment with compression sleeve on R knee which she feels is helping.     Patient Stated Goals "get the nerve back in place & make the tingling go away"   Currently in Pain? No/denies   Pain Score 0-No pain   Multiple Pain Sites No                         [facility] Adult PT Treatment/Exercise - 04/25/17 0941      Neuro Re-ed    Neuro Re-ed Details  Foam balance beam, 1 ski pole: tandem walk forward/backward x 5 laps; side stepping with green TB around ankles x 5 laps down/back      Knee/Hip Exercises: Standing   Forward Step Up Step Height: 4";Hand Hold: 1;Right  with blue foam oval on step    Forward Step Up  Limitations R step up to foam oval on 4" step    SLS R SLS stance on foam oval 2 x 30 sec     Knee/Hip Exercises: Seated   Other Seated Knee/Hip Exercises R ITB, piriformis foam rolling x 30 sec    Sit to Sand 1 set;15 reps;without UE support  3" eccentric/concentric      Modalities   Modalities Iontophoresis     Iontophoresis   Type of Iontophoresis Dexamethasone   Location R lateral knee over fibular head   Dose 1.0 ml, 86mA-min    Time 4-6 hrs     Manual Therapy   Manual Therapy Soft tissue mobilization;Myofascial release   Manual therapy comments pt sidelying with lower leg on bolster   Soft tissue mobilization roller stick to R mid ITB/vastus lateralis just inferior to greater trochanter   Myofascial Release Roller stick and manual TPR to mid ITB/vastus lateralis                     PT Long Term Goals - 04/21/17 0805      PT LONG TERM GOAL #1  Title Independent with HEP by 05/30/17   Status On-going     PT LONG TERM GOAL #2   Title R LE strength >/= 4+/5 by 05/30/17   Status On-going     PT LONG TERM GOAL #3   Title Pt will report ability to maintain pressure on brake pedal while driving w/o increased R knee pain by 05/30/17   Status On-going     PT LONG TERM GOAL #4   Title Pt will ascend/descend strairs reciprocally with no evidence of instability on R LE by 05/30/17   Status On-going               Plan - 04/25/17 0936    Clinical Impression Statement Hailey Noble seen in treatment wearing compression sleeve on R knee.  Iontophoresis order signed by MD thus ionto patch #1/6 applied to R lateral knee over fibular head.  Pt. issued iontophoresis educational handout on contraindications, and precautions, and proper wear time.  Pt. verbalized understanding.  Therex focusing on standing strengthening and proprioception activities.  Pt. tolerated all activities well however did have some short-lasting pain with step up onto compliant surface.  Pt. ending  treatment pain free.  Will monitor response to iontophoresis and progress standing strengthening and proprioception activity in coming visits.     PT Treatment/Interventions Patient/family education;Therapeutic exercise;Therapeutic activities;Neuromuscular re-education;Balance training;Manual techniques;Taping;Iontophoresis 4mg /ml Dexamethasone;Electrical Stimulation;Cryotherapy;Vasopneumatic Device;Gait training;Stair training;ADLs/Self Care Home Management   PT Next Visit Plan Continue iontophoresis if benefit noted; R LE strengthening/proprioceptive training, manual therapy and modalities      Patient will benefit from skilled therapeutic intervention in order to improve the following deficits and impairments:  Pain, Decreased range of motion, Decreased strength, Impaired flexibility, Difficulty walking, Hypermobility, Decreased activity tolerance  Visit Diagnosis: Acute pain of right knee  Difficulty in walking, not elsewhere classified  Muscle weakness (generalized)     Problem List Patient Active Problem List   Diagnosis Date Noted  . Knee pain, right 03/10/2017  . Allergic state 12/08/2016  . Vitamin D deficiency 11/22/2016  . Depression with anxiety 04/06/2014  . Obesity (BMI 30-39.9) 04/06/2014  . General medical examination 06/17/2011  . INGUINAL PAIN, LEFT 12/05/2008    Bess Harvest, PTA 04/25/17 12:52 PM  [facility] [place] [facility] [address]  [place], [place], [address] Phone: [phone]   Fax:  [phone]  Name: Hailey Noble MRN: [id number] Date of Birth: October 14, 1966

## 2017-04-25 NOTE — Telephone Encounter (Signed)
Hailey Noble can you check on this?  We didn't write her for restrictions or out of work.

## 2017-04-28 ENCOUNTER — Encounter: Payer: Self-pay | Admitting: *Deleted

## 2017-04-28 ENCOUNTER — Ambulatory Visit: Payer: 59 | Admitting: Physical Therapy

## 2017-04-28 DIAGNOSIS — M6281 Muscle weakness (generalized): Secondary | ICD-10-CM

## 2017-04-28 DIAGNOSIS — R262 Difficulty in walking, not elsewhere classified: Secondary | ICD-10-CM

## 2017-04-28 DIAGNOSIS — M25561 Pain in right knee: Secondary | ICD-10-CM

## 2017-04-28 NOTE — Therapy (Signed)
[facility] [place] [address]  [place], [place], [address] Phone: [phone]   Fax:  [phone]  Physical Therapy Treatment  Patient Details  Name: Hailey Noble MRN: [id number] Date of Birth: 08-Jul-1966 Referring Provider: Karlton Lemon, MD  Encounter Date: 04/28/2017      PT End of Session - 04/28/17 0801    Visit Number 4   Number of Visits 12   Date for PT Re-Evaluation 05/30/17   PT Start Time 0801   PT Stop Time 0843   PT Time Calculation (min) 42 min   Activity Tolerance Patient tolerated treatment well   Behavior During Therapy [facility] for tasks assessed/performed      Past Medical History:  Diagnosis Date  . Allergic state 12/08/2016  . Allergy   . GERD (gastroesophageal reflux disease)   . Knee pain, right 03/10/2017  . Plantar fasciitis   . Sinusitis   . Vitamin D deficiency 11/22/2016    Past Surgical History:  Procedure Laterality Date  . COLONOSCOPY  02-2009   with Henrene Pastor normal  . ESOPHAGOGASTRODUODENOSCOPY  2012  . GANGLION CYST EXCISION Right    right- wrist   . NASAL SEPTUM SURGERY    . NASAL SINUS SURGERY    . TMJ ARTHROSCOPY     left    There were no vitals filed for this visit.      Subjective Assessment - 04/28/17 0806    Subjective Pt noting benefit from ionto patch. Reports pain in calf flared up yesterday when she moved her leg suddenly to avoid being hit by a door - describes pain as like the sciatic pain she has had in the past, but pain resolved on its own.   Patient Stated Goals "get the nerve back in place & make the tingling go away"   Currently in Pain? Yes   Pain Score 0-No pain  3/10 when knee fully extended, otherwise no pain   Pain Location Calf   Pain Orientation Upper   Pain Descriptors / Indicators Discomfort                         [facility] Adult PT Treatment/Exercise - 04/28/17 0801      Knee/Hip Exercises: Stretches   Passive Hamstring  Stretch Right;30 seconds;2 reps   Passive Hamstring Stretch Limitations R LE longsitting   Gastroc Stretch Right;30 seconds;3 reps   Company secretary     Knee/Hip Exercises: Aerobic   Recumbent Bike lvl 2 x 6'     Knee/Hip Exercises: Standing   Heel Raises Both;20 reps;3 seconds   Heel Raises Limitations B con/R SL ecc     Knee/Hip Exercises: Supine   Other Supine Knee/Hip Exercises Bridge + HS curl with heels on peanut ball 15x3"   Other Supine Knee/Hip Exercises Straight leg bridge + alt SLR x15     Manual Therapy   Manual Therapy Soft tissue mobilization;Taping   Manual therapy comments pt prone   Soft tissue mobilization R popliteus   Kinesiotex Inhibit Muscle     Kinesiotix   Inhibit Muscle  30% "I" strip from lateral thigh to medial calf                     PT Long Term Goals - 04/21/17 0805      PT LONG TERM GOAL #1   Title Independent with HEP by 05/30/17   Status On-going  PT LONG TERM GOAL #2   Title R LE strength >/= 4+/5 by 05/30/17   Status On-going     PT LONG TERM GOAL #3   Title Pt will report ability to maintain pressure on brake pedal while driving w/o increased R knee pain by 05/30/17   Status On-going     PT LONG TERM GOAL #4   Title Pt will ascend/descend strairs reciprocally with no evidence of instability on R LE by 05/30/17   Status On-going               Plan - 04/28/17 0810    Clinical Impression Statement Pt reporting new upper midline calf pain with TKE suggestive of popliteal strain. Pt ttp over popliteus, but responded well to STM followed by kinesiotaping with resolution of pain. Therapeutic exercises targeted stretching to calf & hamstring with strengthening exercises targeting these same muscles with good pt tolerance.   Rehab Potential Good   PT Treatment/Interventions Patient/family education;Therapeutic exercise;Therapeutic activities;Neuromuscular re-education;Balance training;Manual  techniques;Taping;Iontophoresis 4mg /ml Dexamethasone;Electrical Stimulation;Cryotherapy;Vasopneumatic Device;Gait training;Stair training;ADLs/Self Care Home Management   PT Next Visit Plan assess repsonse to taping; R LE strengthening/proprioceptive training, manual therapy and modalities including ionto as indicated   Consulted and Agree with Plan of Care Patient      Patient will benefit from skilled therapeutic intervention in order to improve the following deficits and impairments:  Pain, Decreased range of motion, Decreased strength, Impaired flexibility, Difficulty walking, Hypermobility, Decreased activity tolerance  Visit Diagnosis: Acute pain of right knee  Difficulty in walking, not elsewhere classified  Muscle weakness (generalized)     Problem List Patient Active Problem List   Diagnosis Date Noted  . Knee pain, right 03/10/2017  . Allergic state 12/08/2016  . Vitamin D deficiency 11/22/2016  . Depression with anxiety 04/06/2014  . Obesity (BMI 30-39.9) 04/06/2014  . General medical examination 06/17/2011  . INGUINAL PAIN, LEFT 12/05/2008    Percival Spanish, PT, MPT 04/28/2017, 8:44 AM  [facility] [address]  [place], [place], [address] Phone: [phone]   Fax:  [phone]  Name: Hailey Noble MRN: [id number] Date of Birth: 1966-05-28

## 2017-04-29 NOTE — Telephone Encounter (Signed)
Addressed yesterday.

## 2017-05-01 ENCOUNTER — Ambulatory Visit: Payer: 59

## 2017-05-01 DIAGNOSIS — R262 Difficulty in walking, not elsewhere classified: Secondary | ICD-10-CM

## 2017-05-01 DIAGNOSIS — M25561 Pain in right knee: Secondary | ICD-10-CM | POA: Diagnosis not present

## 2017-05-01 DIAGNOSIS — M6281 Muscle weakness (generalized): Secondary | ICD-10-CM | POA: Diagnosis not present

## 2017-05-01 NOTE — Therapy (Signed)
[facility] [place] [address]  [place], [place], [address] Phone: [phone]   Fax:  [phone]  Physical Therapy Treatment  Patient Details  Name: Hailey Noble MRN: [id number] Date of Birth: 01/21/1966 Referring Provider: Karlton Lemon, MD  Encounter Date: 05/01/2017      PT End of Session - 05/01/17 1020    Visit Number 5   Number of Visits 12   Date for PT Re-Evaluation 05/30/17   PT Start Time 1016   PT Stop Time 1100   PT Time Calculation (min) 44 min   Activity Tolerance Patient tolerated treatment well   Behavior During Therapy [facility] for tasks assessed/performed      Past Medical History:  Diagnosis Date  . Allergic state 12/08/2016  . Allergy   . GERD (gastroesophageal reflux disease)   . Knee pain, right 03/10/2017  . Plantar fasciitis   . Sinusitis   . Vitamin D deficiency 11/22/2016    Past Surgical History:  Procedure Laterality Date  . COLONOSCOPY  02-2009   with Henrene Pastor normal  . ESOPHAGOGASTRODUODENOSCOPY  2012  . GANGLION CYST EXCISION Right    right- wrist   . NASAL SEPTUM SURGERY    . NASAL SINUS SURGERY    . TMJ ARTHROSCOPY     left    There were no vitals filed for this visit.      Subjective Assessment - 05/01/17 1017    Subjective Pt. reporting that she has not had calf pain since day of last treatment and she feels the taping has helped in this area.  Pt. with "pulling" behind knee with ascending stairs.     Patient Stated Goals "get the nerve back in place & make the tingling go away"   Currently in Pain? No/denies   Pain Score 0-No pain   Multiple Pain Sites No                         [facility] Adult PT Treatment/Exercise - 05/01/17 1028      Knee/Hip Exercises: Aerobic   Recumbent Bike lvl 2 x 6'     Knee/Hip Exercises: Standing   Hip Flexion Stengthening;Left;10 reps;Knee straight;Right;1 set   Hip Flexion Limitations standing on foam oval; 1 ski pole     Forward Lunges Right;Left;1 set;10 reps;3 seconds   Forward Lunges Limitations at counter; cueing to avoid knee past toe   Hip ADduction Right;Left;Strengthening;10 reps;1 set   Hip ADduction Limitations standing on blue foam oval; 1 ski pole    Hip Abduction Right;Left;Stengthening;10 reps;Knee straight;1 set   Abduction Limitations standing on blue foam oval; 1 ski pole    Hip Extension Right;Left;Stengthening;10 reps;Knee straight;1 set   Extension Limitations standing on blue foam oval; 1 ski pole    Functional Squat 1 set;15 reps;3 seconds   Functional Squat Limitations squating standing on airex pad    SLS with Vectors R SLS stance on blue foam oval with cone touch 3 x 30 sec; light UE support on ski pole    Other Standing Knee Exercises Side stepping with green TB around ankles 2 x 20 ft      Knee/Hip Exercises: Sidelying   Clams L sidelying R clam shell with blue TB x 10 reps     Iontophoresis   Type of Iontophoresis Dexamethasone   Location R lateral knee over fibular head   Dose 1.0 ml, 16mA-min    Time 4-6 hrs  PT Education - 05/01/17 1527    Education provided Yes   Education Details blue looped TB for previous HEP activities   Person(s) Educated Patient   Methods Handout             PT Long Term Goals - 04/21/17 0805      PT LONG TERM GOAL #1   Title Independent with HEP by 05/30/17   Status On-going     PT LONG TERM GOAL #2   Title R LE strength >/= 4+/5 by 05/30/17   Status On-going     PT LONG TERM GOAL #3   Title Pt will report ability to maintain pressure on brake pedal while driving w/o increased R knee pain by 05/30/17   Status On-going     PT LONG TERM GOAL #4   Title Pt will ascend/descend strairs reciprocally with no evidence of instability on R LE by 05/30/17   Status On-going               Plan - 05/01/17 1021    Clinical Impression Statement Pt. noting benefit from taping with resolution of posterior calf  muscle pain.  Pt. feels that ionto patch applied a few visits ago helped thus ionto patch #2/6 applied today.  Treatment focusing on strengthening activity for hip/ankle while incorporating proprioception training.  Pt. tolerated 4-way hip/ankle strengthening on foam oval well today.  Side stepping with band and lunge added and tolerated well.  Will monitor response to ionto patch and progress per pt. tolerance in coming visits.   PT Treatment/Interventions Patient/family education;Therapeutic exercise;Therapeutic activities;Neuromuscular re-education;Balance training;Manual techniques;Taping;Iontophoresis 4mg /ml Dexamethasone;Electrical Stimulation;Cryotherapy;Vasopneumatic Device;Gait training;Stair training;ADLs/Self Care Home Management   PT Next Visit Plan Benefit from ionto patch?: R LE strengthening/proprioceptive training, manual therapy and modalities including ionto as indicated      Patient will benefit from skilled therapeutic intervention in order to improve the following deficits and impairments:  Pain, Decreased range of motion, Decreased strength, Impaired flexibility, Difficulty walking, Hypermobility, Decreased activity tolerance  Visit Diagnosis: Acute pain of right knee  Difficulty in walking, not elsewhere classified  Muscle weakness (generalized)     Problem List Patient Active Problem List   Diagnosis Date Noted  . Knee pain, right 03/10/2017  . Allergic state 12/08/2016  . Vitamin D deficiency 11/22/2016  . Depression with anxiety 04/06/2014  . Obesity (BMI 30-39.9) 04/06/2014  . General medical examination 06/17/2011  . INGUINAL PAIN, LEFT 12/05/2008   Bess Harvest, PTA 05/01/17 3:27 PM   [facility] [address]  [place], [place], [address] Phone: [phone]   Fax:  [phone]  Name: LEANN MAYWEATHER MRN: [id number] Date of Birth: March 11, 1966

## 2017-05-02 MED FILL — ESCITALOPRAM 10 MG TABLET: 10 | 30 days supply | Qty: 30 | Fill #2

## 2017-05-05 ENCOUNTER — Ambulatory Visit: Payer: 59 | Admitting: Physical Therapy

## 2017-05-05 DIAGNOSIS — M6281 Muscle weakness (generalized): Secondary | ICD-10-CM | POA: Diagnosis not present

## 2017-05-05 DIAGNOSIS — M25561 Pain in right knee: Secondary | ICD-10-CM

## 2017-05-05 DIAGNOSIS — R262 Difficulty in walking, not elsewhere classified: Secondary | ICD-10-CM

## 2017-05-05 NOTE — Therapy (Signed)
[facility] [place] [facility] [address]  [address] [place], [place], [address] Phone: [phone]   Fax:  [phone]  Physical Therapy Treatment  Patient Details  Name: Hailey Noble MRN: [id number] Date of Birth: 12/05/1966 Referring Provider: Shane Hudnall, MD  Encounter Date: 05/05/2017      PT End of Session - 05/05/17 1016    Visit Number 6   Number of Visits 12   Date for PT Re-Evaluation 05/30/17   PT Start Time 1016   PT Stop Time 1057   PT Time Calculation (min) 41 min   Activity Tolerance Patient tolerated treatment well   Behavior During Therapy [facility] for tasks assessed/performed      Past Medical History:  Diagnosis Date  . Allergic state 12/08/2016  . Allergy   . GERD (gastroesophageal reflux disease)   . Knee pain, right 03/10/2017  . Plantar fasciitis   . Sinusitis   . Vitamin D deficiency 11/22/2016    Past Surgical History:  Procedure Laterality Date  . COLONOSCOPY  02-2009   with Perry normal  . ESOPHAGOGASTRODUODENOSCOPY  2012  . GANGLION CYST EXCISION Right    right- wrist   . NASAL SEPTUM SURGERY    . NASAL SINUS SURGERY    . TMJ ARTHROSCOPY     left    There were no vitals filed for this visit.      Subjective Assessment - 05/05/17 1019    Subjective Pain at back of knee/upper calf appears to have resolved any denies any further pain at present. Had a massage last week where the therapist focused on the hamstrings and thinks this may have helped.   Patient Stated Goals "get the nerve back in place & make the tingling go away"   Currently in Pain? No/denies            [facility] PT Assessment - 05/05/17 1016      Assessment   Medical Diagnosis R knee pain, fibular head hypermobility, stretch injury of common peroneal nerve   Onset Date/Surgical Date 02/24/17   Next MD Visit 05/14/17   Prior Therapy remote h/o PT for low back     Strength   Right Hip Flexion 4+/5   Right Hip Extension 4/5   Right Hip External Rotation  4/5   Right Hip Internal Rotation 4+/5   Right Hip ABduction 4+/5   Right Hip ADduction 4/5   Left Hip Flexion 4+/5   Left Hip Extension 4/5   Left Hip External Rotation 4+/5   Left Hip Internal Rotation 4+/5   Left Hip ABduction 4+/5   Left Hip ADduction 4+/5   Right Knee Flexion 4+/5   Right Knee Extension 4+/5   Left Knee Flexion 5/5   Left Knee Extension 5/5   Right Ankle Dorsiflexion 5/5   Right Ankle Plantar Flexion 4+/5  fatigue, but no pain   Right Ankle Inversion 5/5   Right Ankle Eversion 5/5   Left Ankle Dorsiflexion 5/5   Left Ankle Plantar Flexion 5/5   Left Ankle Inversion 5/5   Left Ankle Eversion 5/5                     [facility] Adult PT Treatment/Exercise - 05/05/17 1016      Knee/Hip Exercises: Aerobic   Recumbent Bike lvl 3 x 6'     Knee/Hip Exercises: Standing   Heel Raises Both;20 reps;3 seconds   Heel Raises Limitations B con/R SL ecc     Hip Flexion Both;10 reps;Knee straight;Stengthening   Hip Flexion Limitations green TB + opposite LE SLS on blue foam oval, 1-2 pole A   Hip ADduction Both;10 reps;Strengthening   Hip ADduction Limitations green TB + opposite LE SLS on blue foam oval, 1-2 pole A   Hip Abduction Both;10 reps;Knee straight;Stengthening   Abduction Limitations green TB + opposite LE SLS on blue foam oval, 1-2 pole A   Hip Extension Both;10 reps;Knee straight;Stengthening   Extension Limitations green TB + opposite LE SLS on blue foam oval, 1-2 pole A   Other Standing Knee Exercises B Fitter hip abduction & extension (1 black/1 blue) x10 each   Other Standing Knee Exercises B side stepping with red TB at mid/forefoot 2 x 25 ft                PT Education - 05/05/17 1057    Education provided Yes   Education Details Updated HEP - heel raises, 4 way SLR with green TB, sidestepping with red TB at midfoot   Person(s) Educated Patient   Methods Explanation;Demonstration;Handout    Comprehension Verbalized understanding;Returned demonstration             PT Long Term Goals - 05/05/17 1057      PT LONG TERM GOAL #1   Title Independent with HEP by 05/30/17   Status On-going     PT LONG TERM GOAL #2   Title R LE strength >/= 4+/5 by 05/30/17   Status Partially Met     PT LONG TERM GOAL #3   Title Pt will report ability to maintain pressure on brake pedal while driving w/o increased R knee pain by 05/30/17   Status On-going     PT LONG TERM GOAL #4   Title Pt will ascend/descend strairs reciprocally with no evidence of instability on R LE by 05/30/17   Status On-going               Plan - 05/05/17 1021    Clinical Impression Statement Pt reporting all pain currently resolved. Strength has improved with no further pain on MMT resistance, but mild proximal weakness still present on R. Today's exercises targeted this along with balance/proprioception with HEP updated accordingly. Given current level of progress, pt may be ready to transition to HEP by time she see MD next week, therefore will review/update HEP as indicated in prep for transition.   Rehab Potential Good   PT Treatment/Interventions Patient/family education;Therapeutic exercise;Therapeutic activities;Neuromuscular re-education;Balance training;Manual techniques;Taping;Iontophoresis 50m/ml Dexamethasone;Electrical Stimulation;Cryotherapy;Vasopneumatic Device;Gait training;Stair training;ADLs/Self Care Home Management   PT Next Visit Plan Assess response to updated HEP - review & further update HEP as inidcated in prep for probable transition to HEP next week; R LE strengthening/proprioceptive training, manual therapy and modalities including ionto as indicated   Consulted and Agree with Plan of Care Patient      Patient will benefit from skilled therapeutic intervention in order to improve the following deficits and impairments:  Pain, Decreased range of motion, Decreased strength, Impaired  flexibility, Difficulty walking, Hypermobility, Decreased activity tolerance  Visit Diagnosis: Acute pain of right knee  Difficulty in walking, not elsewhere classified  Muscle weakness (generalized)     Problem List Patient Active Problem List   Diagnosis Date Noted  . Knee pain, right 03/10/2017  . Allergic state 12/08/2016  . Vitamin D deficiency 11/22/2016  . Depression with anxiety 04/06/2014  . Obesity (BMI 30-39.9) 04/06/2014  . General medical examination 06/17/2011  . INGUINAL PAIN, LEFT  12/05/2008    Percival Spanish, PT, MPT 05/05/2017, 11:47 AM  [facility] [address]  [place], [place], [address] Phone: [phone]   Fax:  [phone]  Name: AISSATOU FRONCZAK MRN: [id number] Date of Birth: May 30, 1966

## 2017-05-08 ENCOUNTER — Ambulatory Visit: Payer: 59

## 2017-05-08 DIAGNOSIS — M6281 Muscle weakness (generalized): Secondary | ICD-10-CM | POA: Diagnosis not present

## 2017-05-08 DIAGNOSIS — M25561 Pain in right knee: Secondary | ICD-10-CM | POA: Diagnosis not present

## 2017-05-08 DIAGNOSIS — R262 Difficulty in walking, not elsewhere classified: Secondary | ICD-10-CM | POA: Diagnosis not present

## 2017-05-08 NOTE — Therapy (Signed)
[facility] [address]  [place], [place], [address] Phone: [phone]   Fax:  [phone]  Physical Therapy Treatment  Patient Details  Name: Hailey [place] MRN: [id number] Date of Birth: 13-Apr-1966 Referring Provider: Karlton Lemon, MD  Encounter Date: 05/08/2017      PT End of Session - 05/08/17 1710    Visit Number 7   Number of Visits 12   Date for PT Re-Evaluation 05/30/17   PT Start Time 1703   PT Stop Time 1744   PT Time Calculation (min) 41 min   Activity Tolerance Patient tolerated treatment well   Behavior During Therapy [facility] for tasks assessed/performed      Past Medical History:  Diagnosis Date  . Allergic state 12/08/2016  . Allergy   . GERD (gastroesophageal reflux disease)   . Knee pain, right 03/10/2017  . Plantar fasciitis   . Sinusitis   . Vitamin D deficiency 11/22/2016    Past Surgical History:  Procedure Laterality Date  . COLONOSCOPY  02-2009   with Henrene Pastor normal  . ESOPHAGOGASTRODUODENOSCOPY  2012  . GANGLION CYST EXCISION Right    right- wrist   . NASAL SEPTUM SURGERY    . NASAL SINUS SURGERY    . TMJ ARTHROSCOPY     left    There were no vitals filed for this visit.      Subjective Assessment - 05/08/17 1706    Subjective Pt. doing well pain free however noting some tightness in posterior knee.    Patient Stated Goals "get the nerve back in place & make the tingling go away"   Currently in Pain? No/denies   Pain Score 0-No pain   Multiple Pain Sites No                         [facility] Adult PT Treatment/Exercise - 05/08/17 1716      Self-Care   Self-Care Other Self-Care Comments   Other Self-Care Comments  Discussion of current HEP for appropriateness; no issues with updated HEP     Knee/Hip Exercises: Aerobic   Recumbent Bike lvl 3 x 6'     Knee/Hip Exercises: Standing   Heel Raises Both;20 reps;3 seconds   Heel Raises Limitations B con/R SL  ecc   Step Down 1 set;Right;10 reps;Step Height: 6"   Step Down Limitations eccentric step down    Other Standing Knee Exercises B side stepping with green TB at ankles 2 x 30 ft      Knee/Hip Exercises: Supine   Single Leg Bridge Right;Left;15 reps;1 set;Strengthening     Manual Therapy   Manual Therapy Soft tissue mobilization;Myofascial release   Manual therapy comments pt prone   Soft tissue mobilization R medial HS insertion   Myofascial Release R medial/distal HS insertion TPR    Kinesiotex Inhibit Muscle;Create Space     Kinesiotix   Inhibit Muscle  30% stretch "I" strip from ischial tuberosity to insertion of medial HS, 30% "I" strip from ischial tuberosity to lateral HS insertion                       PT Long Term Goals - 05/05/17 1057      PT LONG TERM GOAL #1   Title Independent with HEP by 05/30/17   Status On-going     PT LONG TERM GOAL #2   Title R LE strength >/= 4+/5 by 05/30/17  Status Partially Met     PT LONG TERM GOAL #3   Title Pt will report ability to maintain pressure on brake pedal while driving w/o increased R knee pain by 05/30/17   Status On-going     PT LONG TERM GOAL #4   Title Pt will ascend/descend strairs reciprocally with no evidence of instability on R LE by 05/30/17   Status On-going               Plan - 05/08/17 1733    Clinical Impression Statement Pt. doing well today no issues aside from feeling of, "muscle knot" in medial HS area on posterior knee.  Manual STM/TPR to this area with some relief.  Trial of taping for HS inhibition to end treatment.  Some strengthening activity to target remaining weak muscle groups, which was well tolerated.  Review of current HEP with pt. reporting no issues with initial or updated HEP.  Will monitor response to taping and continue update of HEP in coming visits.   PT Treatment/Interventions Patient/family education;Therapeutic exercise;Therapeutic activities;Neuromuscular  re-education;Balance training;Manual techniques;Taping;Iontophoresis '4mg'$ /ml Dexamethasone;Electrical Stimulation;Cryotherapy;Vasopneumatic Device;Gait training;Stair training;ADLs/Self Care Home Management   PT Next Visit Plan Monitor respones to taping; Review & further update HEP as inidcated in prep for probable transition to HEP next week; R LE strengthening/proprioceptive training, manual therapy and modalities including ionto as indicated      Patient will benefit from skilled therapeutic intervention in order to improve the following deficits and impairments:  Pain, Decreased range of motion, Decreased strength, Impaired flexibility, Difficulty walking, Hypermobility, Decreased activity tolerance  Visit Diagnosis: Acute pain of right knee  Difficulty in walking, not elsewhere classified  Muscle weakness (generalized)     Problem List Patient Active Problem List   Diagnosis Date Noted  . Knee pain, right 03/10/2017  . Allergic state 12/08/2016  . Vitamin D deficiency 11/22/2016  . Depression with anxiety 04/06/2014  . Obesity (BMI 30-39.9) 04/06/2014  . General medical examination 06/17/2011  . INGUINAL PAIN, LEFT 12/05/2008    Bess Harvest, PTA 05/08/17 6:07 PM  [facility] [address]  [place], [place], [address] Phone: [phone]   Fax:  [phone]  Name: Hailey [place] MRN: [id number] Date of Birth: [birth year]

## 2017-05-12 ENCOUNTER — Ambulatory Visit: Payer: 59 | Admitting: Physical Therapy

## 2017-05-12 DIAGNOSIS — R262 Difficulty in walking, not elsewhere classified: Secondary | ICD-10-CM | POA: Diagnosis not present

## 2017-05-12 DIAGNOSIS — M25561 Pain in right knee: Secondary | ICD-10-CM

## 2017-05-12 DIAGNOSIS — M6281 Muscle weakness (generalized): Secondary | ICD-10-CM

## 2017-05-12 NOTE — Therapy (Signed)
[facility] [place] [address]  [place], [place], [address] Phone: [phone]   Fax:  [phone]  Physical Therapy Treatment  Patient Details  Name: Hailey Noble MRN: [id number] Date of Birth: 07-15-1966 Referring Provider: Karlton Lemon, MD  Encounter Date: 05/12/2017      PT End of Session - 05/12/17 0800    Visit Number 8   Number of Visits 12   Date for PT Re-Evaluation 05/30/17   PT Start Time 0800   PT Stop Time 0847   PT Time Calculation (min) 47 min   Activity Tolerance Patient tolerated treatment well   Behavior During Therapy [facility] for tasks assessed/performed      Past Medical History:  Diagnosis Date  . Allergic state 12/08/2016  . Allergy   . GERD (gastroesophageal reflux disease)   . Knee pain, right 03/10/2017  . Plantar fasciitis   . Sinusitis   . Vitamin D deficiency 11/22/2016    Past Surgical History:  Procedure Laterality Date  . COLONOSCOPY  02-2009   with Henrene Pastor normal  . ESOPHAGOGASTRODUODENOSCOPY  2012  . GANGLION CYST EXCISION Right    right- wrist   . NASAL SEPTUM SURGERY    . NASAL SINUS SURGERY    . TMJ ARTHROSCOPY     left    There were no vitals filed for this visit.      Subjective Assessment - 05/12/17 0804    Subjective Pt stating she had a 2 hr massage last night and the MT focused quite a lot on the medial HS. Feels tight area is better but notes some mild soreness in this are today.   Patient Stated Goals "get the nerve back in place & make the tingling go away"   Currently in Pain? No/denies            [facility] PT Assessment - 05/12/17 0800      Assessment   Medical Diagnosis R knee pain, fibular head hypermobility, stretch injury of common peroneal nerve   Referring Provider Karlton Lemon, MD   Onset Date/Surgical Date 02/24/17   Next MD Visit 05/14/17   Prior Therapy remote h/o PT for low back     Strength   Right Hip Flexion 4+/5   Right Hip Extension  4/5   Right Hip External Rotation  4+/5   Right Hip Internal Rotation 4+/5   Right Hip ABduction 4+/5   Right Hip ADduction 4+/5   Left Hip Flexion 4+/5   Left Hip Extension 4+/5   Left Hip External Rotation 4+/5   Left Hip Internal Rotation 4+/5   Left Hip ABduction 4/5   Left Hip ADduction 4+/5   Right Knee Flexion 4+/5   Right Knee Extension 5/5   Left Knee Flexion 5/5   Left Knee Extension 5/5   Right Ankle Dorsiflexion 5/5   Right Ankle Plantar Flexion 5/5   Right Ankle Inversion 5/5   Right Ankle Eversion 5/5   Left Ankle Dorsiflexion 5/5   Left Ankle Plantar Flexion 5/5   Left Ankle Inversion 5/5   Left Ankle Eversion 5/5                     [facility] Adult PT Treatment/Exercise - 05/12/17 0800      Ambulation/Gait   Ambulation/Gait Yes   Ambulation/Gait Assistance 7: Independent   Gait Pattern Within Functional Limits   Stairs Yes   Stairs Assistance 6: Modified independent (Device/Increase time)  Stair Management Technique One rail Left;Alternating pattern;Forwards   Number of Stairs 13   Height of Stairs 7   Gait Comments Mild trendeleburg noted on stait ascent with pt reporting mild discomfort in R posterior knee with TKE during push-off.     Knee/Hip Exercises: Aerobic   Recumbent Bike lvl 3 x 6'     Knee/Hip Exercises: Machines for Strengthening   Cybex Knee Flexion 35# x10 B con/ecc; 25# x10 B con/L ecc     Knee/Hip Exercises: Standing   Heel Raises Right;Left;20 reps   Heel Raises Limitations SLS   Hip Extension Both;15 reps;Knee straight   Extension Limitations at 45 dg toward ABD   Step Down Both;10 reps;Hand Hold: 1;Step Height: 6"   Step Down Limitations lateral step-down with heel touch   SLS with Vectors R & L SLS on blue foam oval + 4 cone tap x15, light 2 pole A   Other Standing Knee Exercises B hip hike on 4" step x15, 2 pole A     Manual Therapy   Kinesiotex Inhibit Muscle     Kinesiotix   Inhibit Muscle  R popliteus - 30%  "I" strip from lateral thigh to medial calf                     PT Long Term Goals - 05/12/17 4268      PT LONG TERM GOAL #1   Title Independent with HEP by 05/30/17   Status Partially Met  Met for current HEP     PT LONG TERM GOAL #2   Title R LE strength >/= 4+/5 by 05/30/17   Status Partially Met     PT LONG TERM GOAL #3   Title Pt will report ability to maintain pressure on brake pedal while driving w/o increased R knee pain by 05/30/17   Status Achieved     PT LONG TERM GOAL #4   Title Pt will ascend/descend strairs reciprocally with no evidence of instability on R LE by 05/30/17   Status Partially Met  very mild L trendelenburg with ascent, otherwise good control including quad control on descent               Plan - 05/12/17 0800    Clinical Impression Statement [name] noting benefit from PT with resolution of initial pain that broght her to PT, but still noting some intermittent medial HS and popliteus pain. Strength improved with only mild weakness still evident in R HS and B glute medius. Pt reporting no further issues with applying pressure on brake pedal while driving, but does note intermittent discomfort in R posterior knee with SLS on R on stairs when pushing off to lift foot to next step. Mild trendeleburg also noted on stair ascent. Pt continues to have good potential to benefit from further skilled PT to address continued muscle tightness/pain and weakness for improved daily function.   Rehab Potential Good   PT Treatment/Interventions Patient/family education;Therapeutic exercise;Therapeutic activities;Neuromuscular re-education;Balance training;Manual techniques;Taping;Iontophoresis '4mg'$ /ml Dexamethasone;Electrical Stimulation;Cryotherapy;Vasopneumatic Device;Gait training;Stair training;ADLs/Self Care Home Management   PT Next Visit Plan Monitor respone to taping; Review & further update HEP as indicated; R LE strengthening/proprioceptive training, manual  therapy and modalities including ionto as indicated   Consulted and Agree with Plan of Care Patient      Patient will benefit from skilled therapeutic intervention in order to improve the following deficits and impairments:  Pain, Decreased range of motion, Decreased strength, Impaired flexibility, Difficulty walking, Hypermobility, Decreased activity  tolerance  Visit Diagnosis: Acute pain of right knee  Difficulty in walking, not elsewhere classified  Muscle weakness (generalized)     Problem List Patient Active Problem List   Diagnosis Date Noted  . Knee pain, right 03/10/2017  . Allergic state 12/08/2016  . Vitamin D deficiency 11/22/2016  . Depression with anxiety 04/06/2014  . Obesity (BMI 30-39.9) 04/06/2014  . General medical examination 06/17/2011  . INGUINAL PAIN, LEFT 12/05/2008    Percival Spanish, PT, MPT 05/12/2017, 12:08 PM  [facility] [address]  [place], [place], [address] Phone: [phone]   Fax:  [phone]  Name: LAKESIA DAHLE MRN: [id number] Date of Birth: [birth year]

## 2017-05-14 ENCOUNTER — Ambulatory Visit: Payer: 59

## 2017-05-14 ENCOUNTER — Encounter: Payer: Self-pay | Admitting: Family Medicine

## 2017-05-14 ENCOUNTER — Ambulatory Visit (INDEPENDENT_AMBULATORY_CARE_PROVIDER_SITE_OTHER): Payer: 59 | Admitting: Family Medicine

## 2017-05-14 DIAGNOSIS — M6281 Muscle weakness (generalized): Secondary | ICD-10-CM

## 2017-05-14 DIAGNOSIS — R262 Difficulty in walking, not elsewhere classified: Secondary | ICD-10-CM

## 2017-05-14 DIAGNOSIS — M25561 Pain in right knee: Secondary | ICD-10-CM

## 2017-05-14 NOTE — Patient Instructions (Signed)
Keep today's physical therapy appointment as your last one. Do home exercises most days of the week for next 6 weeks. No restriction on activities, work at this time. Follow up with me as needed.

## 2017-05-14 NOTE — Therapy (Signed)
[facility] [address]  [place], [place], [address] Phone: [phone]   Fax:  [phone]  Physical Therapy Treatment  Patient Details  Name: Hailey [place] MRN: [id number] Date of Birth: 03-01-1966 Referring Provider: Karlton Lemon, MD  Encounter Date: 05/14/2017      PT End of Session - 05/14/17 0936    Visit Number 9   Number of Visits 12   Date for PT Re-Evaluation 05/30/17   PT Start Time 0933   PT Stop Time 1011   PT Time Calculation (min) 38 min   Activity Tolerance Patient tolerated treatment well   Behavior During Therapy [facility] for tasks assessed/performed      Past Medical History:  Diagnosis Date  . Allergic state 12/08/2016  . Allergy   . GERD (gastroesophageal reflux disease)   . Knee pain, right 03/10/2017  . Plantar fasciitis   . Sinusitis   . Vitamin D deficiency 11/22/2016    Past Surgical History:  Procedure Laterality Date  . COLONOSCOPY  02-2009   with Henrene Pastor normal  . ESOPHAGOGASTRODUODENOSCOPY  2012  . GANGLION CYST EXCISION Right    right- wrist   . NASAL SEPTUM SURGERY    . NASAL SINUS SURGERY    . TMJ ARTHROSCOPY     left    There were no vitals filed for this visit.      Subjective Assessment - 05/14/17 0936    Subjective Pt. doing well with MD releasing her to stop therapy if she wants or continue with focus on transition to home/gym program.     Patient Stated Goals "get the nerve back in place & make the tingling go away"   Currently in Pain? No/denies   Pain Score 0-No pain   Multiple Pain Sites No                         [facility] Adult PT Treatment/Exercise - 05/14/17 0938      Self-Care   Self-Care Other Self-Care Comments   Other Self-Care Comments  Discussion of current HEP for appropriateness and which activities to focus on     Knee/Hip Exercises: Stretches   Passive Hamstring Stretch Right;30 seconds;5 reps   Passive Hamstring Stretch  Limitations Supine with strap into medial, lateral, and neutral HS stretch     Knee/Hip Exercises: Aerobic   Recumbent Bike Lvl 4, 6 min       Knee/Hip Exercises: Machines for Strengthening   Cybex Knee Flexion 25# x 20 B con/L ecc     Knee/Hip Exercises: Standing   Knee Flexion Right;Strengthening;1 set;15 reps   Knee Flexion Limitations 3#; at counter    Hip Abduction Knee straight;Stengthening;15 reps;Right;Left   Abduction Limitations with green looped TB around ankles   Other Standing Knee Exercises Side stepping, forward/backward monster walk with green TB around ankles x 40 ft each   Other Standing Knee Exercises B hip hike on 6" step x 10 reps       Knee/Hip Exercises: Supine   Single Leg Bridge Right;15 reps;1 set;Strengthening                PT Education - 05/14/17 1024    Education provided Yes   Education Details Medial, lateral, neutral HS curl, hip hike, Machine HS curl    Person(s) Educated Patient   Methods Explanation;Demonstration;Verbal cues;Handout   Comprehension Verbalized understanding;Returned demonstration;Verbal cues required;Need further instruction  PT Long Term Goals - 05/12/17 2831      PT LONG TERM GOAL #1   Title Independent with HEP by 05/30/17   Status Partially Met  Met for current HEP     PT LONG TERM GOAL #2   Title R LE strength >/= 4+/5 by 05/30/17   Status Partially Met     PT LONG TERM GOAL #3   Title Pt will report ability to maintain pressure on brake pedal while driving w/o increased R knee pain by 05/30/17   Status Achieved     PT LONG TERM GOAL #4   Title Pt will ascend/descend strairs reciprocally with no evidence of instability on R LE by 05/30/17   Status Partially Met  very mild L trendelenburg with ascent, otherwise good control including quad control on descent               Plan - 05/14/17 1012    Clinical Impression Statement Pt. doing well today.  MD appointment earlier today with MD  releasing her from therapy.  Today focusing on HEP review and update to improve readiness for transition to home/gym program.  Some "tightness" in R medial HS remains thus HEP reviewed today for appropriateness focusing on remaining weaknesses and updated to include HS strengthening and flexibility activities.  Pt. status discussed with PT today with PT agreeing for pt. to return at 1x/wk to update HEP and assess readiness for transition to home/gym program.     PT Treatment/Interventions Patient/family education;Therapeutic exercise;Therapeutic activities;Neuromuscular re-education;Balance training;Manual techniques;Taping;Iontophoresis '4mg'$ /ml Dexamethasone;Electrical Stimulation;Cryotherapy;Vasopneumatic Device;Gait training;Stair training;ADLs/Self Care Home Management   PT Next Visit Plan Check for tolerance to updated HEP and gym program; Review & further update HEP as indicated      Patient will benefit from skilled therapeutic intervention in order to improve the following deficits and impairments:  Pain, Decreased range of motion, Decreased strength, Impaired flexibility, Difficulty walking, Hypermobility, Decreased activity tolerance  Visit Diagnosis: Acute pain of right knee  Difficulty in walking, not elsewhere classified  Muscle weakness (generalized)     Problem List Patient Active Problem List   Diagnosis Date Noted  . Knee pain, right 03/10/2017  . Allergic state 12/08/2016  . Vitamin D deficiency 11/22/2016  . Depression with anxiety 04/06/2014  . Obesity (BMI 30-39.9) 04/06/2014  . General medical examination 06/17/2011  . INGUINAL PAIN, LEFT 12/05/2008    Bess Harvest, PTA 05/14/17 10:41 AM   [facility] [address]  [place], [place], [address] Phone: [phone]   Fax:  [phone]  Name: Hailey [place] MRN: [id number] Date of Birth: August 18, 1966

## 2017-05-16 NOTE — Assessment & Plan Note (Signed)
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  Doing well with physical therapy and home exercises - will finish PT today and continue home exercises for 6 weeks.  Avoid deep squats, lunges, leg press in that time.  Tylenol if needed.  F/u prn.

## 2017-05-16 NOTE — Progress Notes (Signed)
PCP: Mosie Lukes, MD  Subjective:   HPI: Patient is a 51 y.o. female here for right knee injury.  4/23: Patient reports about 1 month ago she was in bed doing usual stretches, rotated and felt a warm sensation lateral aspect of right knee. Associated burning with this. Since then she will get pain if flexing the knee especially past 90 degrees or when driving. Pain level 0/10 currently. No skin changes. Questionable swelling behind the knee. Does get some intermittent tingling into right foot.  5/23: Patient reports she's doing well. Only has a little pain, swelling behind right knee. No radiation of pain or numbness, burning, warmth on outside of knee now. Doing physical therapy and home exercises. Pain level 0/10. No skin changes.  Past Medical History:  Diagnosis Date  . Allergic state 12/08/2016  . Allergy   . GERD (gastroesophageal reflux disease)   . Knee pain, right 03/10/2017  . Plantar fasciitis   . Sinusitis   . Vitamin D deficiency 11/22/2016    Current Outpatient Prescriptions on File Prior to Visit  Medication Sig Dispense Refill  . acetaminophen (TYLENOL) 650 MG CR tablet Take 650 mg by mouth every 8 (eight) hours as needed. pain    . Ascorbic Acid (VITAMIN C) 1000 MG tablet Take 1,000 mg by mouth daily.    Marland Kitchen aspirin EC 81 MG tablet Take 81 mg by mouth daily.    . Calcium Carbonate-Vit D-Min (CALCIUM 1200 PO) Take by mouth daily.      . cetirizine (ZYRTEC) 10 MG tablet Take 10 mg by mouth daily.    . Cholecalciferol (VITAMIN D-1000 MAX ST PO) Take 1 tablet by mouth.    . escitalopram (LEXAPRO) 10 MG tablet Take 1 tablet (10 mg total) by mouth daily. 30 tablet 3  . famotidine (PEPCID) 20 MG tablet Take 20 mg by mouth daily.    . fluticasone (CUTIVATE) 0.05 % cream Apply topically 2 (two) times daily. To worst areas of rash. (Patient not taking: Reported on 04/15/2017) 30 g 1  . hydrOXYzine (ATARAX/VISTARIL) 25 MG tablet Take 1 tablet (25 mg total) by mouth  every 4 (four) hours as needed. (Patient not taking: Reported on 04/15/2017) 30 tablet 0  . ibuprofen (ADVIL,MOTRIN) 200 MG tablet Take 200 mg by mouth every 6 (six) hours as needed. pain    . KRILL OIL 1000 MG CAPS Take 1 capsule by mouth daily.    . mometasone (NASONEX) 50 MCG/ACT nasal spray Place 2 sprays into the nose daily. 17 g 1  . Polyethyl Glycol-Propyl Glycol (SYSTANE FREE OP) Apply 1 drop to eye daily.     No current facility-administered medications on file prior to visit.     Past Surgical History:  Procedure Laterality Date  . COLONOSCOPY  02-2009   with Henrene Pastor normal  . ESOPHAGOGASTRODUODENOSCOPY  2012  . GANGLION CYST EXCISION Right    right- wrist   . NASAL SEPTUM SURGERY    . NASAL SINUS SURGERY    . TMJ ARTHROSCOPY     left    Allergies  Allergen Reactions  . Coconut Fatty Acids Hives  . Codeine   . Hydrocod Polst-Cpm Polst Er     REACTION: rash on face and head, itching  . Mango Flavor Hives  . [name] Other (See Comments)  . Oxycodone-Acetaminophen     REACTION: rash on face and head, itching  . Tussin Cough [Dextromethorphan Hbr]     Broken out in hives  . Ultram [Tramadol]  Other (See Comments)    Dizzy and low blood pressure.     Social History   Social History  . Marital status: Single    Spouse name: N/A  . Number of children: N/A  . Years of education: N/A   Occupational History  . RN [employer]   Social History Main Topics  . Smoking status: Never Smoker  . Smokeless tobacco: Never Used  . Alcohol use 0.0 oz/week     Comment: rare  . Drug use: No  . Sexual activity: Yes    Birth control/ protection: IUD   Other Topics Concern  . 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 Works at [employer]. No dietary restrictions. Lives with dog    Family History  Problem Relation Age of Onset  . Hypertension Father   . Diabetes Father   . Breast cancer Mother  26  . Cancer Mother        breast  . Colon cancer Paternal Grandfather        died at age 98  . Cancer Paternal Grandfather        GI CA  . Obesity Sister   . Diabetes Sister   . Hypertension Sister   . Heart disease Maternal Grandmother        MI  . Cancer Maternal Grandfather        lung, smoker  . Diabetes Maternal Grandfather   . Diabetes Paternal Grandmother   . Heart disease Paternal Grandmother   . Pancreatitis Sister   . Esophageal cancer Neg Hx   . Stomach cancer Neg Hx   . Stroke Neg Hx   . Colon polyps Neg Hx   . Rectal cancer Neg Hx   . Pancreatic cancer Neg Hx     BP 135/83   Pulse 94   Ht 5\' 7"  (1.702 m)   Wt 210 lb (95.3 kg)   BMI 32.89 kg/m   Review of Systems: See HPI above.     Objective:  Physical Exam:  Gen: NAD, comfortable in exam room  Right knee: Mild hypermobility of fibular head.  No other deformity, swelling, bruising. No TTP. FROM.  Strength 5/5 ankle dorsiflexion and plantarflexion Negative ant/post drawers. Negative valgus/varus testing. Negative lachmanns. Negative mcmurrays, apleys, patellar apprehension. Negative tinels at fibular head. NV intact distally.  Left knee: FROM without pain.   Assessment & Plan:  1. 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  Doing well with physical therapy and home exercises - will finish PT today and continue home exercises for 6 weeks.  Avoid deep squats, lunges, leg press in that time.  Tylenol if needed.  F/u prn.

## 2017-05-21 ENCOUNTER — Ambulatory Visit: Payer: 59 | Admitting: Physical Therapy

## 2017-05-21 DIAGNOSIS — M6281 Muscle weakness (generalized): Secondary | ICD-10-CM

## 2017-05-21 DIAGNOSIS — M25561 Pain in right knee: Secondary | ICD-10-CM

## 2017-05-21 DIAGNOSIS — R262 Difficulty in walking, not elsewhere classified: Secondary | ICD-10-CM

## 2017-05-21 NOTE — Therapy (Addendum)
[facility] [address]  [place], [place], [address] Phone: [phone]   Fax:  [phone]  Physical Therapy Treatment  Patient Details  Name: Hailey Noble MRN: [id number] Date of Birth: November 02, 1966 Referring Provider: Karlton Lemon, MD  Encounter Date: 05/21/2017      PT End of Session - 05/21/17 0807    Visit Number 10   Number of Visits 12   Date for PT Re-Evaluation 05/30/17   PT Start Time 0804   PT Stop Time 0846   PT Time Calculation (min) 42 min   Activity Tolerance Patient tolerated treatment well   Behavior During Therapy [facility] for tasks assessed/performed      Past Medical History:  Diagnosis Date  . Allergic state 12/08/2016  . Allergy   . GERD (gastroesophageal reflux disease)   . Knee pain, right 03/10/2017  . Plantar fasciitis   . Sinusitis   . Vitamin D deficiency 11/22/2016    Past Surgical History:  Procedure Laterality Date  . COLONOSCOPY  02-2009   with Henrene Pastor normal  . ESOPHAGOGASTRODUODENOSCOPY  2012  . GANGLION CYST EXCISION Right    right- wrist   . NASAL SEPTUM SURGERY    . NASAL SINUS SURGERY    . TMJ ARTHROSCOPY     left    There were no vitals filed for this visit.      Subjective Assessment - 05/21/17 0806    Subjective Patient feeling well with HEP - No longer having the pain that brought her to therapy   Patient Stated Goals "get the nerve back in place & make the tingling go away"   Currently in Pain? No/denies   Pain Score 0-No pain            [facility] PT Assessment - 05/21/17 0001      Observation/Other Assessments   Focus on Therapeutic Outcomes (FOTO)  Knee: 80 (20% limited)     Strength   Right Hip Flexion 4+/5   Right Hip Extension 4+/5   Right Hip ABduction 4+/5   Right Hip ADduction 4+/5   Left Hip Flexion 4+/5   Left Hip Extension 4+/5   Left Hip ABduction 4+/5   Left Hip ADduction 4+/5   Right Knee Flexion 5/5   Right Knee Extension 5/5    Left Knee Flexion 5/5                     [facility] Adult PT Treatment/Exercise - 05/21/17 0808      Knee/Hip Exercises: Aerobic   Recumbent Bike level 4 x 6 minutes     Knee/Hip Exercises: Machines for Strengthening   Cybex Knee Flexion 25# R LE eccentric 2 x 15   Cybex Leg Press 35# B LE x 15; 25# R LE eccentric x 15     Knee/Hip Exercises: Standing   Heel Raises Right;10 reps   Heel Raises Limitations eccentric   Other Standing Knee Exercises Side stepping, forward/backward monster walk with blue TB around ankles x 40 ft each                     PT Long Term Goals - 05/21/17 7253      PT LONG TERM GOAL #1   Title Independent with HEP by 05/30/17   Status Achieved     PT LONG TERM GOAL #2   Title R LE strength >/= 4+/5 by 05/30/17   Status Achieved  PT LONG TERM GOAL #3   Title Pt will report ability to maintain pressure on brake pedal while driving w/o increased R knee pain by 05/30/17   Status Achieved     PT LONG TERM GOAL #4   Title Pt will ascend/descend strairs reciprocally with no evidence of instability on R LE by 05/30/17   Status Achieved               Plan - 05/21/17 6440    Clinical Impression Statement Angelea has done very with PT, with patient reporting return to PLOF as well as excellent compliance with HEP and gym program. Patient has met all established goals with good increase in strength as well as functional mobility and tolerance to dialy tasks with reduced pain. Patient able to verbalize ways to prevent and manage any pain that does occur. Patient and PT both agreeable upon transition to HEP at this time, with patient welcome to return to PT for any further needs. Will place chart on hold for 30 days for patient comfort.    PT Treatment/Interventions Patient/family education;Therapeutic exercise;Therapeutic activities;Neuromuscular re-education;Balance training;Manual techniques;Taping;Iontophoresis '4mg'$ /ml  Dexamethasone;Electrical Stimulation;Cryotherapy;Vasopneumatic Device;Gait training;Stair training;ADLs/Self Care Home Management   PT Next Visit Plan 30 day hold   Consulted and Agree with Plan of Care Patient      Patient will benefit from skilled therapeutic intervention in order to improve the following deficits and impairments:  Pain, Decreased range of motion, Decreased strength, Impaired flexibility, Difficulty walking, Hypermobility, Decreased activity tolerance  Visit Diagnosis: Acute pain of right knee  Difficulty in walking, not elsewhere classified  Muscle weakness (generalized)     Problem List Patient Active Problem List   Diagnosis Date Noted  . Knee pain, right 03/10/2017  . Allergic state 12/08/2016  . Vitamin D deficiency 11/22/2016  . Depression with anxiety 04/06/2014  . Obesity (BMI 30-39.9) 04/06/2014  . General medical examination 06/17/2011  . INGUINAL PAIN, LEFT 12/05/2008     Lanney Gins, PT, DPT 05/21/17 9:26 AM   [facility] [address]  [place], [place], [address] Phone: [phone]   Fax:  [phone]  Name: Hailey Noble MRN: [id number] Date of Birth: 11/10/1966   PHYSICAL THERAPY DISCHARGE SUMMARY  Visits from Start of Care: 10  Current functional level related to goals / functional outcomes:   Refer to above clinical impression. Pt has not needed to return in > 30 days, therefore will proceed with discharge from PT for this episode.   Remaining deficits:   As above.   Education / Equipment:   HEP  Plan: Patient agrees to discharge.  Patient goals were met. Patient is being discharged due to meeting the stated rehab goals.  ?????    Percival Spanish, PT, MPT 06/26/17, 1:27 PM  [facility] [address]  [place], [place], [address] Phone: [phone]   Fax:  [phone]

## 2017-06-07 MED FILL — ESCITALOPRAM 10 MG TABLET: 10 | 30 days supply | Qty: 30 | Fill #3

## 2017-06-09 ENCOUNTER — Ambulatory Visit (INDEPENDENT_AMBULATORY_CARE_PROVIDER_SITE_OTHER): Payer: 59 | Admitting: Family Medicine

## 2017-06-09 ENCOUNTER — Encounter: Payer: Self-pay | Admitting: Family Medicine

## 2017-06-09 VITALS — BP 110/82 | HR 89 | Temp 98.4°F | Resp 18 | Wt 218.2 lb

## 2017-06-09 DIAGNOSIS — E559 Vitamin D deficiency, unspecified: Secondary | ICD-10-CM

## 2017-06-09 DIAGNOSIS — E669 Obesity, unspecified: Secondary | ICD-10-CM

## 2017-06-09 DIAGNOSIS — Z23 Encounter for immunization: Secondary | ICD-10-CM

## 2017-06-09 DIAGNOSIS — E785 Hyperlipidemia, unspecified: Secondary | ICD-10-CM

## 2017-06-09 DIAGNOSIS — E782 Mixed hyperlipidemia: Secondary | ICD-10-CM

## 2017-06-09 DIAGNOSIS — Z0000 Encounter for general adult medical examination without abnormal findings: Secondary | ICD-10-CM

## 2017-06-09 HISTORY — DX: Hyperlipidemia, unspecified: E78.5

## 2017-06-09 LAB — VITAMIN D 25 HYDROXY (VIT D DEFICIENCY, FRACTURES): VITD: 36.37 ng/mL (ref 30.00–100.00)

## 2017-06-09 NOTE — Assessment & Plan Note (Signed)
Taking 3000 IU daily check level today

## 2017-06-09 NOTE — Assessment & Plan Note (Addendum)
Agrees to labs prior to visit.Shingrix given today

## 2017-06-09 NOTE — Addendum Note (Signed)
Addended by: Magdalene [name] A on: 06/09/2017 11:11 AM   Modules accepted: Orders

## 2017-06-09 NOTE — Patient Instructions (Signed)
MIND diet  DASH Eating Plan DASH stands for "Dietary Approaches to Stop Hypertension." The DASH eating plan is a healthy eating plan that has been shown to reduce high blood pressure (hypertension). It may also reduce your risk for type 2 diabetes, heart disease, and stroke. The DASH eating plan may also help with weight loss. What are tips for following this plan? General guidelines  Avoid eating more than 2,300 mg (milligrams) of salt (sodium) a day. If you have hypertension, you may need to reduce your sodium intake to 1,500 mg a day.  Limit alcohol intake to no more than 1 drink a day for nonpregnant women and 2 drinks a day for men. One drink equals 12 oz of beer, 5 oz of wine, or 1 oz of hard liquor.  Work with your health care provider to maintain a healthy body weight or to lose weight. Ask what an ideal weight is for you.  Get at least 30 minutes of exercise that causes your heart to beat faster (aerobic exercise) most days of the week. Activities may include walking, swimming, or biking.  Work with your health care provider or diet and nutrition specialist (dietitian) to adjust your eating plan to your individual calorie needs. Reading food labels  Check food labels for the amount of sodium per serving. Choose foods with less than 5 percent of the Daily Value of sodium. Generally, foods with less than 300 mg of sodium per serving fit into this eating plan.  To find whole grains, look for the word "whole" as the first word in the ingredient list. Shopping  Buy products labeled as "low-sodium" or "no salt added."  Buy fresh foods. Avoid canned foods and premade or frozen meals. Cooking  Avoid adding salt when cooking. Use salt-free seasonings or herbs instead of table salt or sea salt. Check with your health care provider or pharmacist before using salt substitutes.  Do not fry foods. Cook foods using healthy methods such as baking, boiling, grilling, and broiling  instead.  Cook with heart-healthy oils, such as olive, canola, soybean, or sunflower oil. Meal planning   Eat a balanced diet that includes: ? 5 or more servings of fruits and vegetables each day. At each meal, try to fill half of your plate with fruits and vegetables. ? Up to 6-8 servings of whole grains each day. ? Less than 6 oz of lean meat, poultry, or fish each day. A 3-oz serving of meat is about the same size as a deck of cards. One egg equals 1 oz. ? 2 servings of low-fat dairy each day. ? A serving of nuts, seeds, or beans 5 times each week. ? Heart-healthy fats. Healthy fats called Omega-3 fatty acids are found in foods such as flaxseeds and coldwater fish, like sardines, salmon, and mackerel.  Limit how much you eat of the following: ? Canned or prepackaged foods. ? Food that is high in trans fat, such as fried foods. ? Food that is high in saturated fat, such as fatty meat. ? Sweets, desserts, sugary drinks, and other foods with added sugar. ? Full-fat dairy products.  Do not salt foods before eating.  Try to eat at least 2 vegetarian meals each week.  Eat more home-cooked food and less restaurant, buffet, and fast food.  When eating at a restaurant, ask that your food be prepared with less salt or no salt, if possible. What foods are recommended? The items listed may not be a complete list. Talk with your   dietitian about what dietary choices are best for you. Grains Whole-grain or whole-wheat bread. Whole-grain or whole-wheat pasta. Brown rice. Oatmeal. Quinoa. Bulgur. Whole-grain and low-sodium cereals. Pita bread. Low-fat, low-sodium crackers. Whole-wheat flour tortillas. Vegetables Fresh or frozen vegetables (raw, steamed, roasted, or grilled). Low-sodium or reduced-sodium tomato and vegetable juice. Low-sodium or reduced-sodium tomato sauce and tomato paste. Low-sodium or reduced-sodium canned vegetables. Fruits All fresh, dried, or frozen fruit. Canned fruit in  natural juice (without added sugar). Meat and other protein foods Skinless chicken or turkey. Ground chicken or turkey. Pork with fat trimmed off. Fish and seafood. Egg whites. Dried beans, peas, or lentils. Unsalted nuts, nut butters, and seeds. Unsalted canned beans. Lean cuts of beef with fat trimmed off. Low-sodium, lean deli meat. Dairy Low-fat (1%) or fat-free (skim) milk. Fat-free, low-fat, or reduced-fat cheeses. Nonfat, low-sodium ricotta or cottage cheese. Low-fat or nonfat yogurt. Low-fat, low-sodium cheese. Fats and oils Soft margarine without trans fats. Vegetable oil. Low-fat, reduced-fat, or light mayonnaise and salad dressings (reduced-sodium). Canola, safflower, olive, soybean, and sunflower oils. Avocado. Seasoning and other foods Herbs. Spices. Seasoning mixes without salt. Unsalted popcorn and pretzels. Fat-free sweets. What foods are not recommended? The items listed may not be a complete list. Talk with your dietitian about what dietary choices are best for you. Grains Baked goods made with fat, such as croissants, muffins, or some breads. Dry pasta or rice meal packs. Vegetables Creamed or fried vegetables. Vegetables in a cheese sauce. Regular canned vegetables (not low-sodium or reduced-sodium). Regular canned tomato sauce and paste (not low-sodium or reduced-sodium). Regular tomato and vegetable juice (not low-sodium or reduced-sodium). Pickles. Olives. Fruits Canned fruit in a light or heavy syrup. Fried fruit. Fruit in cream or butter sauce. Meat and other protein foods Fatty cuts of meat. Ribs. Fried meat. Bacon. Sausage. Bologna and other processed lunch meats. Salami. Fatback. Hotdogs. Bratwurst. Salted nuts and seeds. Canned beans with added salt. Canned or smoked fish. Whole eggs or egg yolks. Chicken or turkey with skin. Dairy Whole or 2% milk, cream, and half-and-half. Whole or full-fat cream cheese. Whole-fat or sweetened yogurt. Full-fat cheese. Nondairy  creamers. Whipped toppings. Processed cheese and cheese spreads. Fats and oils Butter. Stick margarine. Lard. Shortening. Ghee. Bacon fat. Tropical oils, such as coconut, palm kernel, or palm oil. Seasoning and other foods Salted popcorn and pretzels. Onion salt, garlic salt, seasoned salt, table salt, and sea salt. Worcestershire sauce. Tartar sauce. Barbecue sauce. Teriyaki sauce. Soy sauce, including reduced-sodium. Steak sauce. Canned and packaged gravies. Fish sauce. Oyster sauce. Cocktail sauce. Horseradish that you find on the shelf. Ketchup. Mustard. Meat flavorings and tenderizers. Bouillon cubes. Hot sauce and Tabasco sauce. Premade or packaged marinades. Premade or packaged taco seasonings. Relishes. Regular salad dressings. Where to find more information:  National Heart, Lung, and Blood Institute: www.nhlbi.nih.gov  American Heart Association: www.heart.org Summary  The DASH eating plan is a healthy eating plan that has been shown to reduce high blood pressure (hypertension). It may also reduce your risk for type 2 diabetes, heart disease, and stroke.  With the DASH eating plan, you should limit salt (sodium) intake to 2,300 mg a day. If you have hypertension, you may need to reduce your sodium intake to 1,500 mg a day.  When on the DASH eating plan, aim to eat more fresh fruits and vegetables, whole grains, lean proteins, low-fat dairy, and heart-healthy fats.  Work with your health care provider or diet and nutrition specialist (dietitian) to adjust your eating plan   to your individual calorie needs. This information is not intended to replace advice given to you by your health care provider. Make sure you discuss any questions you have with your health care provider. Document Released: 11/28/2011 Document Revised: 12/02/2016 Document Reviewed: 12/02/2016 Elsevier Interactive Patient Education  2017 Elsevier Inc.  

## 2017-06-09 NOTE — Progress Notes (Signed)
Subjective:  I acted as a Education administrator for Dr. Charlett Blake. Hailey Noble, Hailey Noble  Patient ID: Hailey Noble, female    DOB: 1966-02-18, 51 y.o.   MRN: [id number]  No chief complaint on file.   HPI  Patient is in today for a 3 month follow up.Patient states since starting Lexapro she has been feeling fine. No recent febrile illness or acute hospitalizations. Denies CP/palp/SOB/HA/congestion/fevers/GI or GU c/o. Taking meds as prescribed .  She feels well today, is stiff from working in the yard yesterday. She is pleased with the Lexapro and is feeling better. No anhedonia endorsed.    Patient Care Team: Mosie Lukes, MD as PCP - General (Family Medicine) Key, Nelia Shi, NP as Nurse Practitioner (Gynecology) Paula Compton, MD as Consulting Physician (Obstetrics and Gynecology) Rosemary Holms, DPM as Consulting Physician (Podiatry)   Past Medical History:  Diagnosis Date  . Allergic state 12/08/2016  . Allergy   . GERD (gastroesophageal reflux disease)   . Hyperlipidemia 06/09/2017  . Knee pain, right 03/10/2017  . Plantar fasciitis   . Sinusitis   . Vitamin D deficiency 11/22/2016    Past Surgical History:  Procedure Laterality Date  . COLONOSCOPY  02-2009   with Henrene Pastor normal  . ESOPHAGOGASTRODUODENOSCOPY  2012  . GANGLION CYST EXCISION Right    right- wrist   . NASAL SEPTUM SURGERY    . NASAL SINUS SURGERY    . TMJ ARTHROSCOPY     left    Family History  Problem Relation Age of Onset  . Hypertension Father   . Diabetes Father   . Breast cancer Mother 52  . Cancer Mother        breast  . Colon cancer Paternal Grandfather        died at age 61  . Cancer Paternal Grandfather        GI CA  . Obesity Sister   . Diabetes Sister   . Hypertension Sister   . Heart disease Maternal Grandmother        MI  . Cancer Maternal Grandfather        lung, smoker  . Diabetes Maternal Grandfather   . Diabetes Paternal Grandmother   . Heart disease Paternal Grandmother   . Pancreatitis  Sister   . Esophageal cancer Neg Hx   . Stomach cancer Neg Hx   . Stroke Neg Hx   . Colon polyps Neg Hx   . Rectal cancer Neg Hx   . Pancreatic cancer Neg Hx     Social History   Social History  . Marital status: Single    Spouse name: N/A  . Number of children: N/A  . Years of education: N/A   Occupational History  . RN [employer]   Social History Main Topics  . Smoking status: Never Smoker  . Smokeless tobacco: Never Used  . Alcohol use 0.0 oz/week     Comment: rare  . Drug use: No  . Sexual activity: Yes    Birth control/ protection: IUD   Other Topics Concern  . 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 Works at [employer]. No dietary restrictions. Lives with dog    Outpatient Medications Prior to Visit  Medication Sig Dispense Refill  . acetaminophen (TYLENOL) 650 MG CR tablet Take 650 mg by mouth every 8 (eight) hours as needed. pain    . Ascorbic Acid (VITAMIN C) 1000  MG tablet Take 1,000 mg by mouth daily.    Marland Kitchen aspirin EC 81 MG tablet Take 81 mg by mouth daily.    . Calcium Carbonate-Vit D-Min (CALCIUM 1200 PO) Take by mouth daily.      . cetirizine (ZYRTEC) 10 MG tablet Take 10 mg by mouth daily.    . Cholecalciferol (VITAMIN D-1000 MAX ST PO) Take 1 tablet by mouth.    . escitalopram (LEXAPRO) 10 MG tablet Take 1 tablet (10 mg total) by mouth daily. 30 tablet 3  . famotidine (PEPCID) 20 MG tablet Take 20 mg by mouth daily.    . fluticasone (CUTIVATE) 0.05 % cream Apply topically 2 (two) times daily. To worst areas of rash. 30 g 1  . hydrOXYzine (ATARAX/VISTARIL) 25 MG tablet Take 1 tablet (25 mg total) by mouth every 4 (four) hours as needed. 30 tablet 0  . ibuprofen (ADVIL,MOTRIN) 200 MG tablet Take 200 mg by mouth every 6 (six) hours as needed. pain    . KRILL OIL 1000 MG CAPS Take 1 capsule by mouth daily.    . mometasone (NASONEX) 50 MCG/ACT nasal spray Place 2 sprays into the  nose daily. 17 g 1  . Polyethyl Glycol-Propyl Glycol (SYSTANE FREE OP) Apply 1 drop to eye daily.     No facility-administered medications prior to visit.     Allergies  Allergen Reactions  . Coconut Fatty Acids Hives  . Codeine   . Hydrocod Polst-Cpm Polst Er     REACTION: rash on face and head, itching  . Mango Flavor Hives  . [name] Other (See Comments)  . Oxycodone-Acetaminophen     REACTION: rash on face and head, itching  . Tussin Cough [Dextromethorphan Hbr]     Broken out in hives  . Ultram [Tramadol] Other (See Comments)    Dizzy and low blood pressure.     Review of Systems  Constitutional: Positive for malaise/fatigue. Negative for fever.  HENT: Negative for congestion.   Eyes: Negative for blurred vision.  Respiratory: Negative for cough and shortness of breath.   Cardiovascular: Negative for chest pain, palpitations and leg swelling.  Gastrointestinal: Negative for vomiting.  Musculoskeletal: Negative for back pain.  Skin: Negative for rash.  Neurological: Negative for loss of consciousness and headaches.       Objective:    Physical Exam  Constitutional: She is oriented to person, place, and time. She appears well-developed and well-nourished. No distress.  HENT:  Head: Normocephalic and atraumatic.  Eyes: Conjunctivae are normal.  Neck: Normal range of motion. No thyromegaly present.  Cardiovascular: Normal rate and regular rhythm.   Pulmonary/Chest: Effort normal and breath sounds normal. She has no wheezes.  Abdominal: Soft. Bowel sounds are normal. There is no tenderness.  Musculoskeletal: Normal range of motion. She exhibits no edema or deformity.  Neurological: She is alert and oriented to person, place, and time.  Skin: Skin is warm and dry. She is not diaphoretic.  Psychiatric: She has a normal mood and affect.    There were no vitals taken for this visit. Wt Readings from Last 3 Encounters:  05/14/17 210 lb (95.3 kg)  04/14/17 205 lb  (93 kg)  03/10/17 214 lb (97.1 kg)   BP Readings from Last 3 Encounters:  05/14/17 135/83  04/14/17 120/75  03/10/17 (!) 130/48     Immunization History  Administered Date(s) Administered  . Influenza Split 09/22/2012, 10/01/2013  . Influenza,inj,Quad PF,36+ Mos 09/22/2014  . Tdap 04/06/2014    Health Maintenance  Topic Date Due  . HIV Screening  05/02/1980  . INFLUENZA VACCINE  07/23/2017  . MAMMOGRAM  12/09/2017  . PAP SMEAR  11/25/2018  . COLONOSCOPY  05/02/2021  . TETANUS/TDAP  04/06/2024    Lab Results  Component Value Date   WBC 9.8 11/22/2016   HGB 13.1 11/22/2016   HCT 40.7 11/22/2016   PLT 328 11/22/2016   GLUCOSE 85 11/22/2016   CHOL 139 11/22/2016   TRIG 162 (H) 11/22/2016   HDL 36 (L) 11/22/2016   LDLCALC 71 11/22/2016   ALT 12 11/22/2016   AST 13 11/22/2016   NA 137 11/22/2016   K 4.2 11/22/2016   CL 105 11/22/2016   CREATININE 0.67 11/22/2016   BUN 13 11/22/2016   CO2 25 11/22/2016   TSH 1.30 11/22/2016   HGBA1C 5.1 04/06/2014    Lab Results  Component Value Date   TSH 1.30 11/22/2016   Lab Results  Component Value Date   WBC 9.8 11/22/2016   HGB 13.1 11/22/2016   HCT 40.7 11/22/2016   MCV 92.3 11/22/2016   PLT 328 11/22/2016   Lab Results  Component Value Date   NA 137 11/22/2016   K 4.2 11/22/2016   CO2 25 11/22/2016   GLUCOSE 85 11/22/2016   BUN 13 11/22/2016   CREATININE 0.67 11/22/2016   BILITOT 0.3 11/22/2016   ALKPHOS 84 11/22/2016   AST 13 11/22/2016   ALT 12 11/22/2016   PROT 6.4 11/22/2016   ALBUMIN 4.0 11/22/2016   CALCIUM 8.8 11/22/2016   GFR 112.44 05/26/2015   Lab Results  Component Value Date   CHOL 139 11/22/2016   Lab Results  Component Value Date   HDL 36 (L) 11/22/2016   Lab Results  Component Value Date   LDLCALC 71 11/22/2016   Lab Results  Component Value Date   TRIG 162 (H) 11/22/2016   Lab Results  Component Value Date   CHOLHDL 3.9 11/22/2016   Lab Results  Component Value Date    HGBA1C 5.1 04/06/2014         Assessment & Plan:   Problem List Items Addressed This Visit    General medical examination    Agrees to labs prior to visit.Shingrix given today      Relevant Orders   CBC   Comprehensive metabolic panel   TSH   Obesity (BMI 30-39.9)    Encouraged DASH diet, decrease po intake and increase exercise as tolerated. Needs 7-8 hours of sleep nightly. Avoid trans fats, eat small, frequent meals every 4-5 hours with lean proteins, complex carbs and healthy fats. Minimize simple carbs, bariatric referral      Vitamin D deficiency - Primary    Taking 3000 IU daily check level today      Relevant Orders   Vitamin D (25 hydroxy)   Vitamin D (25 hydroxy)   Hyperlipidemia    Encouraged heart healthy diet, increase exercise, avoid trans fats, consider a krill oil cap daily      Relevant Orders   Lipid panel      I am having Ms. Stutz maintain her Calcium Carbonate-Vit D-Min (CALCIUM 1200 PO), acetaminophen, ibuprofen, Polyethyl Glycol-Propyl Glycol (SYSTANE FREE OP), Krill Oil, cetirizine, hydrOXYzine, fluticasone, vitamin C, famotidine, Cholecalciferol (VITAMIN D-1000 MAX ST PO), aspirin EC, mometasone, and escitalopram.  No orders of the defined types were placed in this encounter.   CMA served as Education administrator during this visit. History, Physical and Plan performed by medical provider. Documentation and orders reviewed and  attested to.  Penni Homans, MD

## 2017-06-09 NOTE — Assessment & Plan Note (Signed)
Encouraged DASH diet, decrease po intake and increase exercise as tolerated. Needs 7-8 hours of sleep nightly. Avoid trans fats, eat small, frequent meals every 4-5 hours with lean proteins, complex carbs and healthy fats. Minimize simple carbs, bariatric referral 

## 2017-06-09 NOTE — Assessment & Plan Note (Signed)
Encouraged heart healthy diet, increase exercise, avoid trans fats, consider a krill oil cap daily 

## 2017-07-03 ENCOUNTER — Other Ambulatory Visit: Payer: Self-pay | Admitting: Family Medicine

## 2017-07-03 MED FILL — ESCITALOPRAM 10 MG TABLET: 10 | 30 days supply | Qty: 30 | Fill #0

## 2017-08-08 MED FILL — ESCITALOPRAM 10 MG TABLET: 10 | 30 days supply | Qty: 30 | Fill #1

## 2017-09-02 ENCOUNTER — Encounter (INDEPENDENT_AMBULATORY_CARE_PROVIDER_SITE_OTHER): Payer: Self-pay

## 2017-09-10 MED FILL — ESCITALOPRAM 10 MG TABLET: 10 | 30 days supply | Qty: 30 | Fill #2

## 2017-09-23 ENCOUNTER — Ambulatory Visit (INDEPENDENT_AMBULATORY_CARE_PROVIDER_SITE_OTHER): Payer: Self-pay | Admitting: Physician Assistant

## 2017-09-26 ENCOUNTER — Telehealth: Payer: Self-pay | Admitting: *Deleted

## 2017-09-26 NOTE — Telephone Encounter (Signed)
Sent mychart message to pt regarding 2nd shingrix dose.

## 2017-10-01 NOTE — Telephone Encounter (Signed)
Princess-- FYI:  Pt wants to get 2nd shingrix vaccine at f/u with Charlett Blake in December so I have written her name on one of the vaccines in the fridge.

## 2017-10-02 NOTE — Telephone Encounter (Signed)
Noted thanks °

## 2017-10-06 ENCOUNTER — Ambulatory Visit (INDEPENDENT_AMBULATORY_CARE_PROVIDER_SITE_OTHER): Payer: 59 | Admitting: Family Medicine

## 2017-10-06 ENCOUNTER — Ambulatory Visit (INDEPENDENT_AMBULATORY_CARE_PROVIDER_SITE_OTHER): Payer: Self-pay | Admitting: Family Medicine

## 2017-10-06 ENCOUNTER — Encounter (INDEPENDENT_AMBULATORY_CARE_PROVIDER_SITE_OTHER): Payer: Self-pay | Admitting: Family Medicine

## 2017-10-06 VITALS — BP 128/79 | HR 76 | Temp 97.9°F | Ht 65.0 in | Wt 212.0 lb

## 2017-10-06 DIAGNOSIS — Z6835 Body mass index (BMI) 35.0-35.9, adult: Secondary | ICD-10-CM | POA: Diagnosis not present

## 2017-10-06 DIAGNOSIS — R5383 Other fatigue: Secondary | ICD-10-CM

## 2017-10-06 DIAGNOSIS — E559 Vitamin D deficiency, unspecified: Secondary | ICD-10-CM

## 2017-10-06 DIAGNOSIS — Z1331 Encounter for screening for depression: Secondary | ICD-10-CM | POA: Diagnosis not present

## 2017-10-06 DIAGNOSIS — R0602 Shortness of breath: Secondary | ICD-10-CM | POA: Diagnosis not present

## 2017-10-06 DIAGNOSIS — Z0289 Encounter for other administrative examinations: Secondary | ICD-10-CM

## 2017-10-06 NOTE — Progress Notes (Signed)
Office: [phone]  /  Fax: [phone]   Dear Dr. Charlett Noble,   Thank you for referring Hailey Noble to our clinic. The following note includes my evaluation and treatment recommendations.  HPI:   Chief Complaint: Hailey Noble has been referred by Hailey Levine A. Charlett Blake, MD for consultation regarding her obesity and obesity related comorbidities.    Hailey Noble (MR# [id number]) is a 51 y.o. female who presents on 10/06/2017 for obesity evaluation and treatment. Current BMI is Body mass index is 35.28 kg/m.Marland Kitchen [name] has been struggling with her weight for many years and has been unsuccessful in either losing weight, maintaining weight loss, or reaching her healthy weight goal.     Hailey Levine attended our information session and states she is currently in the action stage of change and ready to dedicate time achieving and maintaining a healthier weight. Hailey Noble is interested in becoming our patient and working on intensive lifestyle modifications including (but not limited to) diet, exercise and weight loss.    Hailey Noble states her family eats meals together she thinks her family will eat healthier with her she struggles with family and or coworkers weight loss sabotage her desired weight loss is approximately 31 lbs she started gaining weight since 2009 her heaviest weight ever was 221 lbs. she has significant food cravings issues  she snacks frequently in the evenings she wakes up frequently in the middle of the night to eat she skips meals frequently she has problems with excessive hunger  she frequently eats larger portions than normal  she has binge eating behaviors she struggles with emotional eating    Fatigue Hailey Noble feels her energy is lower than it should be. This has worsened with weight gain and has not worsened recently. Hailey Noble admits to daytime somnolence and  denies waking up still tired. Patient is at risk for obstructive sleep apnea. Patent has a history of symptoms of  daytime fatigue. Patient generally gets 8 hours of sleep per night, and states they generally have restful sleep. Snoring is present. Apneic episodes are not present. Epworth Sleepiness Score is 18  Dyspnea on exertion Chi notes increasing shortness of breath with exercising and seems to be worsening over time with weight gain. She notes getting out of breath sooner with activity than she used to.This has not gotten worse recently. Hailey Noble denies orthopnea.  Vitamin D deficiency Hailey Noble has a diagnosis of vitamin D deficiency. She is currently taking OTC vit D 2,000 IU daily and admits fatigue but denies nausea, vomiting or muscle weakness.   Depression Screen Hailey Noble's Food and Mood (modified PHQ-9) score was  Depression screen PHQ 2/9 10/06/2017  Decreased Interest 0  Down, Depressed, Hopeless 0  PHQ - 2 Score 0  Altered sleeping 0  Tired, decreased energy 1  Change in appetite 2  Feeling bad or failure about yourself  0  Trouble concentrating 0  Moving slowly or fidgety/restless 0  Suicidal thoughts 0  PHQ-9 Score 3    ALLERGIES: Allergies  Allergen Reactions  . Coconut Fatty Acids Hives  . Codeine   . Hydrocod Polst-Cpm Polst Er     REACTION: rash on face and head, itching  . Mango Flavor Hives  . [name] Other (See Comments)  . Oxycodone-Acetaminophen     REACTION: rash on face and head, itching  . Tussin Cough [Dextromethorphan Hbr]     Broken out in hives  . Ultram [Tramadol] Other (See Comments)    Dizzy and low blood  pressure.     MEDICATIONS: Current Outpatient Prescriptions on File Prior to Visit  Medication Sig Dispense Refill  . acetaminophen (TYLENOL) 650 MG CR tablet Take 650 mg by mouth every 8 (eight) hours as needed. pain    . Ascorbic Acid (VITAMIN C) 1000 MG tablet Take 1,000 mg by mouth daily.    Marland Kitchen aspirin EC 81 MG tablet Take 81 mg by mouth daily.    . Calcium Carbonate-Vit D-Min (CALCIUM 1200 PO) Take by mouth daily.      . cetirizine  (ZYRTEC) 10 MG tablet Take 10 mg by mouth daily.    . Cholecalciferol (VITAMIN D-1000 MAX ST PO) Take 1 tablet by mouth.    . escitalopram (LEXAPRO) 10 MG tablet TAKE 1 TABLET (10 MG TOTAL) BY MOUTH DAILY. 30 tablet 3  . famotidine (PEPCID) 20 MG tablet Take 20 mg by mouth daily.    . fluticasone (CUTIVATE) 0.05 % cream Apply topically 2 (two) times daily. To worst areas of rash. 30 g 1  . hydrOXYzine (ATARAX/VISTARIL) 25 MG tablet Take 1 tablet (25 mg total) by mouth every 4 (four) hours as needed. 30 tablet 0  . ibuprofen (ADVIL,MOTRIN) 200 MG tablet Take 200 mg by mouth every 6 (six) hours as needed. pain    . KRILL OIL 1000 MG CAPS Take 1 capsule by mouth daily.    . mometasone (NASONEX) 50 MCG/ACT nasal spray Place 2 sprays into the nose daily. 17 g 1  . Polyethyl Glycol-Propyl Glycol (SYSTANE FREE OP) Apply 1 drop to eye daily.     No current facility-administered medications on file prior to visit.     PAST MEDICAL HISTORY: Past Medical History:  Diagnosis Date  . Allergic state 12/08/2016  . Allergy   . Anxiety   . Back pain   . GERD (gastroesophageal reflux disease)   . Hyperlipidemia 06/09/2017  . Joint pain   . Knee pain, right 03/10/2017  . Plantar fasciitis   . Shortness of breath   . Sinusitis   . Vitamin D deficiency 11/22/2016  . Vitamin D deficiency     PAST SURGICAL HISTORY: Past Surgical History:  Procedure Laterality Date  . COLONOSCOPY  02-2009   with Henrene Pastor normal  . ESOPHAGOGASTRODUODENOSCOPY  2012  . GANGLION CYST EXCISION Right    right- wrist   . NASAL SEPTUM SURGERY    . NASAL SINUS SURGERY    . TMJ ARTHROSCOPY     left    SOCIAL HISTORY: Social History  Substance Use Topics  . Smoking status: Never Smoker  . Smokeless tobacco: Never Used  . Alcohol use 0.0 oz/week     Comment: rare    FAMILY HISTORY: Family History  Problem Relation Age of Onset  . Hypertension Father   . Diabetes Father   . Hyperlipidemia Father   . Breast cancer  Mother 65  . Cancer Mother        breast  . Thyroid disease Mother   . Sleep apnea Mother   . Colon cancer Paternal Grandfather        died at age 57  . Cancer Paternal Grandfather        GI CA  . Obesity Sister   . Diabetes Sister   . Hypertension Sister   . Heart disease Maternal Grandmother        MI  . Cancer Maternal Grandfather        lung, smoker  . Diabetes Maternal Grandfather   . Diabetes  Paternal Grandmother   . Heart disease Paternal Grandmother   . Pancreatitis Sister   . Esophageal cancer Neg Hx   . Stomach cancer Neg Hx   . Stroke Neg Hx   . Colon polyps Neg Hx   . Rectal cancer Neg Hx   . Pancreatic cancer Neg Hx     ROS: Review of Systems  Constitutional: Positive for malaise/fatigue.  HENT: Positive for congestion (nasal stuffiness), hearing loss and sinus pain.        Hay Fever  Eyes:       Wear Glasses Dry Eye  Respiratory: Positive for shortness of breath (on exertion).   Cardiovascular: Negative for orthopnea.  Gastrointestinal: Positive for nausea. Negative for vomiting.  Musculoskeletal: Positive for joint pain.       Negative muscle weakness  Neurological: Positive for headaches.  Endo/Heme/Allergies: Bruises/bleeds easily (easy bruising).       Heat Intolerance  Psychiatric/Behavioral:       Stress     PHYSICAL EXAM: Blood pressure 128/79, pulse 76, temperature 97.9 F (36.6 C), height 5\' 5"  (1.651 m), weight 212 lb (96.2 kg), SpO2 96 %. Body mass index is 35.28 kg/m. Physical Exam  Constitutional: She is oriented to person, place, and time. She appears well-developed and well-nourished.  Cardiovascular: Normal rate.   Pulmonary/Chest: Effort normal.  Musculoskeletal: Normal range of motion.  Neurological: She is oriented to person, place, and time.  Skin: Skin is warm and dry.  Psychiatric: She has a normal mood and affect. Her behavior is normal.  Vitals reviewed.   RECENT LABS AND TESTS: BMET    Component Value Date/Time    NA 137 11/22/2016 1521   K 4.2 11/22/2016 1521   CL 105 11/22/2016 1521   CO2 25 11/22/2016 1521   GLUCOSE 85 11/22/2016 1521   GLUCOSE 86 11/13/2009   BUN 13 11/22/2016 1521   CREATININE 0.67 11/22/2016 1521   CALCIUM 8.8 11/22/2016 1521   GFRNONAA >90 02/04/2012 1220   GFRAA >90 02/04/2012 1220   Lab Results  Component Value Date   HGBA1C 5.1 04/06/2014   No results found for: INSULIN CBC    Component Value Date/Time   WBC 9.8 11/22/2016 1521   RBC 4.41 11/22/2016 1521   HGB 13.1 11/22/2016 1521   HCT 40.7 11/22/2016 1521   PLT 328 11/22/2016 1521   PLT 381 11/13/2009   MCV 92.3 11/22/2016 1521   MCH 29.7 11/22/2016 1521   MCHC 32.2 11/22/2016 1521   RDW 12.6 11/22/2016 1521   LYMPHSABS 1.7 05/26/2015 0931   MONOABS 0.5 05/26/2015 0931   EOSABS 0.1 05/26/2015 0931   BASOSABS 0.0 05/26/2015 0931   Iron/TIBC/Ferritin/ %Sat    Component Value Date/Time   FERRITIN 50 11/09/2008   Lipid Panel     Component Value Date/Time   CHOL 139 11/22/2016 1521   TRIG 162 (H) 11/22/2016 1521   TRIG 138 11/13/2009 1337   HDL 36 (L) 11/22/2016 1521   CHOLHDL 3.9 11/22/2016 1521   VLDL 32 (H) 11/22/2016 1521   LDLCALC 71 11/22/2016 1521   Hepatic Function Panel     Component Value Date/Time   PROT 6.4 11/22/2016 1521   ALBUMIN 4.0 11/22/2016 1521   AST 13 11/22/2016 1521   ALT 12 11/22/2016 1521   ALKPHOS 84 11/22/2016 1521   BILITOT 0.3 11/22/2016 1521   BILIDIR 0.1 05/26/2015 0931      Component Value Date/Time   TSH 1.30 11/22/2016 1521   TSH 1.67 05/26/2015  0931   TSH 1.11 04/06/2014 0852    ECG  shows NSR with a rate of 83 BPM INDIRECT CALORIMETER done today shows a VO2 of 208 and a REE of 1446.  Her calculated basal metabolic rate is 1610 thus her basal metabolic rate is worse than expected.    ASSESSMENT AND PLAN: Other fatigue - Plan: EKG 12-Lead, Vitamin B12, CBC With Differential, Comprehensive metabolic panel, Folate, Hemoglobin A1c, Insulin,  random, Lipid Panel With LDL/HDL Ratio, T3, T4, free, TSH  Shortness of breath on exertion - Plan: Lipid Panel With LDL/HDL Ratio  Vitamin D deficiency - Plan: VITAMIN D 25 Hydroxy (Vit-D Deficiency, Fractures)  Depression screening  Class 2 severe obesity with serious comorbidity and body mass index (BMI) of 35.0 to 35.9 in adult, unspecified obesity type (HCC)  PLAN: Fatigue Jayliani was informed that her fatigue may be related to obesity, depression or many other causes. Labs will be ordered, and in the meanwhile Shareen has agreed to work on diet, exercise and weight loss to help with fatigue. Proper sleep hygiene was discussed including the need for 7-8 hours of quality sleep each night. A sleep study was not ordered based on symptoms and Epworth score.  Dyspnea on exertion Hadleigh's shortness of breath appears to be obesity related and exercise induced. She has agreed to work on weight loss and gradually increase exercise to treat her exercise induced shortness of breath. If Kialee follows our instructions and loses weight without improvement of her shortness of breath, we will plan to refer to pulmonology. We will monitor this condition regularly. Azlee agrees to this plan.  Vitamin D Deficiency Meeyah was informed that low vitamin D levels contributes to fatigue and are associated with obesity, breast, and colon cancer. She agrees to continue OTC vitamin D for now. We will check labs and will follow up for routine testing of vitamin D, at least 2-3 times per year. She was informed of the risk of over-replacement of vitamin D and agrees to not increase her dose unless he discusses this with [name] first.  Depression Screen Hayven had a negative depression screening. Depression is commonly associated with obesity and often results in emotional eating behaviors. We will monitor this closely and work on CBT to help improve the non-hunger eating patterns. Referral to Psychology may be required if  no improvement is seen as she continues in our clinic.  Obesity Vicky is currently in the action stage of change and her goal is to continue with weight loss efforts. I recommend Terrianne begin the structured treatment plan as follows:  She has agreed to follow the Category 2 plan +100 calories Eimi has been instructed to eventually work up to a goal of 150 minutes of combined cardio and strengthening exercise per week for weight loss and overall health benefits. We discussed the following Behavioral Modification Strategies today: increasing lean protein intake, decreasing simple carbohydrates  and work on meal planning and easy cooking plans   She was informed of the importance of frequent follow up visits to maximize her success with intensive lifestyle modifications for her multiple health conditions. She was informed we would discuss her lab results at her next visit unless there is a critical issue that needs to be addressed sooner. Swayze agreed to keep her next visit at the agreed upon time to discuss these results.  I, Doreene Nest, am acting as transcriptionist for  Dennard Nip, MD  I have reviewed the above documentation for accuracy and completeness, and  I agree with the above. -Dennard Nip, MD    OBESITY BEHAVIORAL INTERVENTION VISIT  Today's visit was # 1 out of 22.  Starting weight: 212 lbs Starting date: 10/06/17 Today's weight : 212 lbs Today's date: 10/06/2017 Total lbs lost to date: 212 lbs (Patients must lose 7 lbs in the first 6 months to continue with counseling)   ASK: We discussed the diagnosis of obesity with Hebert Soho today and Docie agreed to give [name] permission to discuss obesity behavioral modification therapy today.  ASSESS: Swayzee has the diagnosis of obesity and her BMI today is 35.28 Nekesha is in the action stage of change   ADVISE: Tanis was educated on the multiple health risks of obesity as well as the benefit of weight loss to  improve her health. She was advised of the need for long term treatment and the importance of lifestyle modifications.  AGREE: Multiple dietary modification options and treatment options were discussed and  Tsuruko agreed to follow the Category 2 plan +100 calories We discussed the following Behavioral Modification Strategies today: increasing lean protein intake, decreasing simple carbohydrates  and work on meal planning and easy cooking plans

## 2017-10-07 LAB — CBC WITH DIFFERENTIAL
BASOS: 1 %
Basophils Absolute: 0.1 10*3/uL (ref 0.0–0.2)
EOS (ABSOLUTE): 0.1 10*3/uL (ref 0.0–0.4)
EOS: 1 %
HEMATOCRIT: 42.4 % (ref 34.0–46.6)
HEMOGLOBIN: 13.8 g/dL (ref 11.1–15.9)
IMMATURE GRANULOCYTES: 0 %
Immature Grans (Abs): 0 10*3/uL (ref 0.0–0.1)
LYMPHS ABS: 1.8 10*3/uL (ref 0.7–3.1)
Lymphs: 19 %
MCH: 29.3 pg (ref 26.6–33.0)
MCHC: 32.5 g/dL (ref 31.5–35.7)
MCV: 90 fL (ref 79–97)
MONOCYTES: 5 %
Monocytes Absolute: 0.5 10*3/uL (ref 0.1–0.9)
Neutrophils Absolute: 7.2 10*3/uL — ABNORMAL HIGH (ref 1.4–7.0)
Neutrophils: 74 %
RBC: 4.71 x10E6/uL (ref 3.77–5.28)
RDW: 13.5 % (ref 12.3–15.4)
WBC: 9.6 10*3/uL (ref 3.4–10.8)

## 2017-10-07 LAB — TSH: TSH: 1.69 u[IU]/mL (ref 0.450–4.500)

## 2017-10-07 LAB — LIPID PANEL WITH LDL/HDL RATIO
Cholesterol, Total: 140 mg/dL (ref 100–199)
HDL: 48 mg/dL (ref >39)
LDL CALC: 70 mg/dL (ref 0–99)
LDL/HDL RATIO: 1.5 ratio (ref 0.0–3.2)
Triglycerides: 112 mg/dL (ref 0–149)
VLDL Cholesterol Cal: 22 mg/dL (ref 5–40)

## 2017-10-07 LAB — FOLATE: Folate: 15.9 ng/mL (ref >3.0)

## 2017-10-07 LAB — VITAMIN D 25 HYDROXY (VIT D DEFICIENCY, FRACTURES): VIT D 25 HYDROXY: 36.9 ng/mL (ref 30.0–100.0)

## 2017-10-07 LAB — COMPREHENSIVE METABOLIC PANEL
ALBUMIN: 4.4 g/dL (ref 3.5–5.5)
ALT: 17 IU/L (ref 0–32)
AST: 16 IU/L (ref 0–40)
Albumin/Globulin Ratio: 1.8 (ref 1.2–2.2)
Alkaline Phosphatase: 116 IU/L (ref 39–117)
BUN / CREAT RATIO: 15 (ref 9–23)
BUN: 10 mg/dL (ref 6–24)
Bilirubin Total: 0.4 mg/dL (ref 0.0–1.2)
CALCIUM: 9 mg/dL (ref 8.7–10.2)
CO2: 24 mmol/L (ref 20–29)
CREATININE: 0.67 mg/dL (ref 0.57–1.00)
Chloride: 102 mmol/L (ref 96–106)
GFR, EST AFRICAN AMERICAN: 117 mL/min/{1.73_m2} (ref >59)
GFR, EST NON AFRICAN AMERICAN: 101 mL/min/{1.73_m2} (ref >59)
GLOBULIN, TOTAL: 2.5 g/dL (ref 1.5–4.5)
Glucose: 76 mg/dL (ref 65–99)
Potassium: 4.1 mmol/L (ref 3.5–5.2)
SODIUM: 141 mmol/L (ref 134–144)
Total Protein: 6.9 g/dL (ref 6.0–8.5)

## 2017-10-07 LAB — T3: T3 TOTAL: 124 ng/dL (ref 71–180)

## 2017-10-07 LAB — VITAMIN B12: VITAMIN B 12: 678 pg/mL (ref 232–1245)

## 2017-10-07 LAB — INSULIN, RANDOM: INSULIN: 8.8 u[IU]/mL (ref 2.6–24.9)

## 2017-10-07 LAB — HEMOGLOBIN A1C
ESTIMATED AVERAGE GLUCOSE: 103 mg/dL
Hgb A1c MFr Bld: 5.2 % (ref 4.8–5.6)

## 2017-10-07 LAB — T4, FREE: Free T4: 1.02 ng/dL (ref 0.82–1.77)

## 2017-10-14 MED FILL — ESCITALOPRAM 10 MG TABLET: 10 | 30 days supply | Qty: 30 | Fill #3

## 2017-10-21 ENCOUNTER — Ambulatory Visit (INDEPENDENT_AMBULATORY_CARE_PROVIDER_SITE_OTHER): Payer: 59 | Admitting: Family Medicine

## 2017-10-21 VITALS — BP 138/79 | HR 64 | Temp 98.1°F | Ht 65.0 in | Wt 209.0 lb

## 2017-10-21 DIAGNOSIS — Z9189 Other specified personal risk factors, not elsewhere classified: Secondary | ICD-10-CM | POA: Diagnosis not present

## 2017-10-21 DIAGNOSIS — Z6834 Body mass index (BMI) 34.0-34.9, adult: Secondary | ICD-10-CM

## 2017-10-21 DIAGNOSIS — E88819 Insulin resistance, unspecified: Secondary | ICD-10-CM | POA: Insufficient documentation

## 2017-10-21 DIAGNOSIS — E669 Obesity, unspecified: Secondary | ICD-10-CM | POA: Diagnosis not present

## 2017-10-21 DIAGNOSIS — E559 Vitamin D deficiency, unspecified: Secondary | ICD-10-CM

## 2017-10-21 DIAGNOSIS — E8881 Metabolic syndrome: Secondary | ICD-10-CM | POA: Diagnosis not present

## 2017-10-21 MED ORDER — VITAMIN D (ERGOCALCIFEROL) 1.25 MG (50000 UNIT) PO CAPS: 50000.0000 [IU] | ORAL_CAPSULE | ORAL | 0 refills | Status: DC

## 2017-10-21 MED FILL — VIT D2 1.25 MG (50,000 UNIT: 1.25 MG | 28 days supply | Qty: 4 | Fill #0

## 2017-10-21 NOTE — Progress Notes (Signed)
Office: [phone]  /  Fax: [phone]   HPI:   Chief Complaint: OBESITY Hailey Noble is here to discuss her progress with her obesity treatment plan. She is on the Category 2 plan + 100 calories and is following her eating plan approximately 100 % of the time. She states she is exercising 0 minutes 0 times per week. [name] has done very well on the Category 2 plan. She found meal planning challenging but improved. Her hunger was mostly controlled and she was happy with her food choices.  Her weight is 209 lb (94.8 kg) today and has had a weight loss of 3 pounds over a period of 2 weeks since her last visit. She has lost 3 lbs since starting treatment with [name].  Vitamin D deficiency Hailey Noble has a diagnosis of vitamin D deficiency. She is on OTC Vitamin D 4,000 IU q daily and she notes fatigue but denies nausea, vomiting or muscle weakness.  Insulin Resistance Hailey Noble has a new diagnosis of insulin resistance with low fasting glucose and A1c but fasting insulin level >5. Although Hailey Noble's blood glucose readings are still under good control, insulin resistance puts her at greater risk of metabolic syndrome and diabetes. She is not taking metformin currently and continues to work on diet and exercise to decrease risk of diabetes. She notes occasional hypoglycemia.  At risk for diabetes Hailey Noble is at higher than average risk for developing diabetes due to her obesity and insulin resistance. She currently denies polyuria or polydipsia.  ALLERGIES: Allergies  Allergen Reactions  . Coconut Fatty Acids Hives  . Codeine   . Hydrocod Polst-Cpm Polst Er     REACTION: rash on face and head, itching  . Mango Flavor Hives  . [name] Other (See Comments)  . Oxycodone-Acetaminophen     REACTION: rash on face and head, itching  . Tussin Cough [Dextromethorphan Hbr]     Broken out in hives  . Ultram [Tramadol] Other (See Comments)    Dizzy and low blood pressure.     MEDICATIONS: Current  Outpatient Prescriptions on File Prior to Visit  Medication Sig Dispense Refill  . acetaminophen (TYLENOL) 650 MG CR tablet Take 650 mg by mouth every 8 (eight) hours as needed. pain    . Ascorbic Acid (VITAMIN C) 1000 MG tablet Take 1,000 mg by mouth daily.    Hailey Noble Kitchen aspirin EC 81 MG tablet Take 81 mg by mouth daily.    . Calcium Carbonate-Vit D-Min (CALCIUM 1200 PO) Take by mouth daily.      . cetirizine (ZYRTEC) 10 MG tablet Take 10 mg by mouth daily.    . Cholecalciferol (VITAMIN D-1000 MAX ST PO) Take 1 tablet by mouth.    . escitalopram (LEXAPRO) 10 MG tablet TAKE 1 TABLET (10 MG TOTAL) BY MOUTH DAILY. 30 tablet 3  . famotidine (PEPCID) 20 MG tablet Take 20 mg by mouth daily.    . fluticasone (CUTIVATE) 0.05 % cream Apply topically 2 (two) times daily. To worst areas of rash. 30 g 1  . hydrOXYzine (ATARAX/VISTARIL) 25 MG tablet Take 1 tablet (25 mg total) by mouth every 4 (four) hours as needed. 30 tablet 0  . ibuprofen (ADVIL,MOTRIN) 200 MG tablet Take 200 mg by mouth every 6 (six) hours as needed. pain    . KRILL OIL 1000 MG CAPS Take 1 capsule by mouth daily.    . mometasone (NASONEX) 50 MCG/ACT nasal spray Place 2 sprays into the nose daily. 17 g 1  . Polyethyl Glycol-Propyl  Glycol (SYSTANE FREE OP) Apply 1 drop to eye daily.     No current facility-administered medications on file prior to visit.     PAST MEDICAL HISTORY: Past Medical History:  Diagnosis Date  . Allergic state 12/08/2016  . Allergy   . Anxiety   . Back pain   . GERD (gastroesophageal reflux disease)   . Hyperlipidemia 06/09/2017  . Joint pain   . Knee pain, right 03/10/2017  . Plantar fasciitis   . Shortness of breath   . Sinusitis   . Vitamin D deficiency 11/22/2016  . Vitamin D deficiency     PAST SURGICAL HISTORY: Past Surgical History:  Procedure Laterality Date  . COLONOSCOPY  02-2009   with Henrene Pastor normal  . ESOPHAGOGASTRODUODENOSCOPY  2012  . GANGLION CYST EXCISION Right    right- wrist   . NASAL  SEPTUM SURGERY    . NASAL SINUS SURGERY    . TMJ ARTHROSCOPY     left    SOCIAL HISTORY: Social History  Substance Use Topics  . Smoking status: Never Smoker  . Smokeless tobacco: Never Used  . Alcohol use 0.0 oz/week     Comment: rare    FAMILY HISTORY: Family History  Problem Relation Age of Onset  . Hypertension Father   . Diabetes Father   . Hyperlipidemia Father   . Breast cancer Mother 103  . Cancer Mother        breast  . Thyroid disease Mother   . Sleep apnea Mother   . Colon cancer Paternal Grandfather        died at age 26  . Cancer Paternal Grandfather        GI CA  . Obesity Sister   . Diabetes Sister   . Hypertension Sister   . Heart disease Maternal Grandmother        MI  . Cancer Maternal Grandfather        lung, smoker  . Diabetes Maternal Grandfather   . Diabetes Paternal Grandmother   . Heart disease Paternal Grandmother   . Pancreatitis Sister   . Esophageal cancer Neg Hx   . Stomach cancer Neg Hx   . Stroke Neg Hx   . Colon polyps Neg Hx   . Rectal cancer Neg Hx   . Pancreatic cancer Neg Hx     ROS: Review of Systems  Constitutional: Positive for malaise/fatigue and weight loss.  Gastrointestinal: Negative for nausea and vomiting.  Genitourinary: Negative for frequency.  Musculoskeletal:       Negative muscle weakness  Endo/Heme/Allergies: Negative for polydipsia.       Positive hypoglycemia    PHYSICAL EXAM: Blood pressure 138/79, pulse 64, temperature 98.1 F (36.7 C), temperature source Oral, height 5\' 5"  (1.651 m), weight 209 lb (94.8 kg), last menstrual period 09/03/2017, SpO2 97 %. Body mass index is 34.78 kg/m. Physical Exam  Constitutional: She is oriented to person, place, and time. She appears well-developed and well-nourished.  Cardiovascular: Normal rate.   Pulmonary/Chest: Effort normal.  Musculoskeletal: Normal range of motion.  Neurological: She is oriented to person, place, and time.  Skin: Skin is warm and dry.   Psychiatric: She has a normal mood and affect. Her behavior is normal.  Vitals reviewed.   RECENT LABS AND TESTS: BMET    Component Value Date/Time   NA 141 10/06/2017 0850   K 4.1 10/06/2017 0850   CL 102 10/06/2017 0850   CO2 24 10/06/2017 0850   GLUCOSE 76 10/06/2017 0850  GLUCOSE 85 11/22/2016 1521   GLUCOSE 86 11/13/2009   BUN 10 10/06/2017 0850   CREATININE 0.67 10/06/2017 0850   CREATININE 0.67 11/22/2016 1521   CALCIUM 9.0 10/06/2017 0850   GFRNONAA 101 10/06/2017 0850   GFRAA 117 10/06/2017 0850   Lab Results  Component Value Date   HGBA1C 5.2 10/06/2017   HGBA1C 5.1 04/06/2014   Lab Results  Component Value Date   INSULIN 8.8 10/06/2017   CBC    Component Value Date/Time   WBC 9.6 10/06/2017 0850   WBC 9.8 11/22/2016 1521   RBC 4.71 10/06/2017 0850   RBC 4.41 11/22/2016 1521   HGB 13.8 10/06/2017 0850   HCT 42.4 10/06/2017 0850   PLT 328 11/22/2016 1521   PLT 381 11/13/2009   MCV 90 10/06/2017 0850   MCH 29.3 10/06/2017 0850   MCH 29.7 11/22/2016 1521   MCHC 32.5 10/06/2017 0850   MCHC 32.2 11/22/2016 1521   RDW 13.5 10/06/2017 0850   LYMPHSABS 1.8 10/06/2017 0850   MONOABS 0.5 05/26/2015 0931   EOSABS 0.1 10/06/2017 0850   BASOSABS 0.1 10/06/2017 0850   Iron/TIBC/Ferritin/ %Sat    Component Value Date/Time   FERRITIN 50 11/09/2008   Lipid Panel     Component Value Date/Time   CHOL 140 10/06/2017 0850   TRIG 112 10/06/2017 0850   TRIG 138 11/13/2009 1337   HDL 48 10/06/2017 0850   CHOLHDL 3.9 11/22/2016 1521   VLDL 32 (H) 11/22/2016 1521   LDLCALC 70 10/06/2017 0850   Hepatic Function Panel     Component Value Date/Time   PROT 6.9 10/06/2017 0850   ALBUMIN 4.4 10/06/2017 0850   AST 16 10/06/2017 0850   ALT 17 10/06/2017 0850   ALKPHOS 116 10/06/2017 0850   BILITOT 0.4 10/06/2017 0850   BILIDIR 0.1 05/26/2015 0931      Component Value Date/Time   TSH 1.690 10/06/2017 0850   TSH 1.30 11/22/2016 1521   TSH 1.67 05/26/2015  0931    ASSESSMENT AND PLAN: Vitamin D deficiency - Plan: Vitamin D, Ergocalciferol, (DRISDOL) 50000 units CAPS capsule  Insulin resistance  At risk for diabetes mellitus  Class 1 obesity with serious comorbidity and body mass index (BMI) of 34.0 to 34.9 in adult, unspecified obesity type  PLAN:  Vitamin D Deficiency Hailey Noble was informed that low vitamin D levels contributes to fatigue and are associated with obesity, breast, and colon cancer. Hailey Noble agrees to continue taking OTC vitamin D 4,000 daily and She agrees to start prescription Vit D @50 ,000 IU every week #4 with no refills. She will follow up for routine testing of vitamin D, at least 2-3 times per year. She was informed of the risk of over-replacement of vitamin D and agrees to not increase her dose unless he discusses this with [name] first. We will recheck labs in 3 months and Hailey Noble agrees to follow up with our clinic in 1 and a half weeks.  Insulin Resistance Hailey Noble will continue to work on weight loss, diet, exercise, and decreasing simple carbohydrates in her diet to help decrease the risk of diabetes. We discussed diabetes mellitus prevention education. We dicussed metformin including benefits and risks. She was informed that eating too many simple carbohydrates or too many calories at one sitting increases the likelihood of GI side effects. Hailey Noble declined metformin for now and prescription was not written today. We will recheck labs in 3 months and Hailey Noble agrees to follow up with our clinic in 1 and a  half weeks as directed to monitor her progress.  Diabetes risk counselling Hailey Noble was given extended (30 minutes) diabetes prevention counseling today. She is 51 y.o. female and has risk factors for diabetes including obesity and insulin resistance. We discussed intensive lifestyle modifications today with an emphasis on weight loss as well as increasing exercise and decreasing simple carbohydrates in her diet.  Obesity Hailey Noble is  currently in the action stage of change. As such, her goal is to continue with weight loss efforts She has agreed to follow the Category 2 plan + 100 calories Hailey Noble has been instructed to work up to a goal of 150 minutes of combined cardio and strengthening exercise per week for weight loss and overall health benefits. We discussed the following Behavioral Modification Strategies today: increasing lean protein intake and decreasing simple carbohydrates    Bethel has agreed to follow up with our clinic in 1 and a half weeks. She was informed of the importance of frequent follow up visits to maximize her success with intensive lifestyle modifications for her multiple health conditions.  I, Trixie Dredge, am acting as transcriptionist for Hailey Nip, MD  I have reviewed the above documentation for accuracy and completeness, and I agree with the above. -Hailey Nip, MD      Today's visit was # 2 out of 71.  Starting weight: 212 lbs Starting date: 10/06/17 Today's weight : 209 lbs  Today's date: 10/21/2017 Total lbs lost to date: 3 (Patients must lose 7 lbs in the first 6 months to continue with counseling)   ASK: We discussed the diagnosis of obesity with Hebert Soho today and Chanta agreed to give [name] permission to discuss obesity behavioral modification therapy today.  ASSESS: Kharizma has the diagnosis of obesity and her BMI today is 34.78 Endia is in the action stage of change   ADVISE: Rika was educated on the multiple health risks of obesity as well as the benefit of weight loss to improve her health. She was advised of the need for long term treatment and the importance of lifestyle modifications.  AGREE: Multiple dietary modification options and treatment options were discussed and  Aster agreed to follow the Category 2 plan + 100 calories We discussed the following Behavioral Modification Strategies today: increasing lean protein intake and decreasing simple  carbohydrates

## 2017-11-10 ENCOUNTER — Ambulatory Visit (INDEPENDENT_AMBULATORY_CARE_PROVIDER_SITE_OTHER): Payer: 59 | Admitting: Family Medicine

## 2017-11-10 VITALS — BP 121/78 | HR 76 | Temp 98.4°F | Ht 65.0 in | Wt 207.0 lb

## 2017-11-10 DIAGNOSIS — Z9189 Other specified personal risk factors, not elsewhere classified: Secondary | ICD-10-CM

## 2017-11-10 DIAGNOSIS — E559 Vitamin D deficiency, unspecified: Secondary | ICD-10-CM | POA: Diagnosis not present

## 2017-11-10 DIAGNOSIS — Z6834 Body mass index (BMI) 34.0-34.9, adult: Secondary | ICD-10-CM

## 2017-11-10 DIAGNOSIS — E669 Obesity, unspecified: Secondary | ICD-10-CM | POA: Diagnosis not present

## 2017-11-10 MED ORDER — VITAMIN D (ERGOCALCIFEROL) 1.25 MG (50000 UNIT) PO CAPS: 50000.0000 [IU] | ORAL_CAPSULE | ORAL | 0 refills | Status: DC

## 2017-11-10 NOTE — Progress Notes (Signed)
Office: [phone]  /  Fax: [phone]   HPI:   Chief Complaint: OBESITY Hailey Noble is here to discuss her progress with her obesity treatment plan. She is on the Category 2 plan +100 calories and is following her eating plan approximately 99 % of the time. She states she is exercising elliptical for 35 to 45 minutes 3 to 4 times per week (5k Saturday). Hailey Noble continues to do well with weight loss on the category 2 plan. She has started back with exercising and has questions about what type and how much. She is concerned about holiday eating. Her weight is 207 lb (93.9 kg) today and has had a weight loss of 2 pounds over a period of 3 weeks since her last visit. She has lost 5 lbs since starting treatment with [name].  Vitamin D deficiency Hailey Noble has a diagnosis of vitamin D deficiency. She is currently taking prescription vit D and OTC vit D 4,000 IU daily and hasn't been at goal yet. There is no change in fatigue and she denies nausea, vomiting or muscle weakness.  At risk for osteopenia and osteoporosis Hailey Noble is at higher risk of osteopenia and osteoporosis due to vitamin D deficiency.    ALLERGIES: Allergies  Allergen Reactions  . Coconut Fatty Acids Hives  . Codeine   . Hydrocod Polst-Cpm Polst Er     REACTION: rash on face and head, itching  . Mango Flavor Hives  . [name] Other (See Comments)  . Oxycodone-Acetaminophen     REACTION: rash on face and head, itching  . Tussin Cough [Dextromethorphan Hbr]     Broken out in hives  . Ultram [Tramadol] Other (See Comments)    Dizzy and low blood pressure.     MEDICATIONS: Current Outpatient Medications on File Prior to Visit  Medication Sig Dispense Refill  . acetaminophen (TYLENOL) 650 MG CR tablet Take 650 mg by mouth every 8 (eight) hours as needed. pain    . Ascorbic Acid (VITAMIN C) 1000 MG tablet Take 1,000 mg by mouth daily.    Marland Kitchen aspirin EC 81 MG tablet Take 81 mg by mouth daily.    . Calcium Carbonate-Vit D-Min  (CALCIUM 1200 PO) Take by mouth daily.      . cetirizine (ZYRTEC) 10 MG tablet Take 10 mg by mouth daily.    . Cholecalciferol (VITAMIN D-1000 MAX ST PO) Take 1 tablet by mouth.    . escitalopram (LEXAPRO) 10 MG tablet TAKE 1 TABLET (10 MG TOTAL) BY MOUTH DAILY. 30 tablet 3  . famotidine (PEPCID) 20 MG tablet Take 20 mg daily as needed by mouth.     Marland Kitchen ibuprofen (ADVIL,MOTRIN) 200 MG tablet Take 200 mg by mouth every 6 (six) hours as needed. pain    . KRILL OIL 1000 MG CAPS Take 1 capsule by mouth daily.    . mometasone (NASONEX) 50 MCG/ACT nasal spray Place 2 sprays into the nose daily. 17 g 1  . Polyethyl Glycol-Propyl Glycol (SYSTANE FREE OP) Apply 1 drop to eye daily.    . Vitamin D, Ergocalciferol, (DRISDOL) 50000 units CAPS capsule Take 1 capsule (50,000 Units total) by mouth every 7 (seven) days. 4 capsule 0   No current facility-administered medications on file prior to visit.     PAST MEDICAL HISTORY: Past Medical History:  Diagnosis Date  . Allergic state 12/08/2016  . Allergy   . Anxiety   . Back pain   . GERD (gastroesophageal reflux disease)   . Hyperlipidemia 06/09/2017  .  Joint pain   . Knee pain, right 03/10/2017  . Plantar fasciitis   . Shortness of breath   . Sinusitis   . Vitamin D deficiency 11/22/2016  . Vitamin D deficiency     PAST SURGICAL HISTORY: Past Surgical History:  Procedure Laterality Date  . COLONOSCOPY  02-2009   with Henrene Pastor normal  . ESOPHAGOGASTRODUODENOSCOPY  2012  . GANGLION CYST EXCISION Right    right- wrist   . NASAL SEPTUM SURGERY    . NASAL SINUS SURGERY    . TMJ ARTHROSCOPY     left    SOCIAL HISTORY: Social History   Tobacco Use  . Smoking status: Never Smoker  . Smokeless tobacco: Never Used  Substance Use Topics  . Alcohol use: Yes    Alcohol/week: 0.0 oz    Comment: rare  . Drug use: No    FAMILY HISTORY: Family History  Problem Relation Age of Onset  . Hypertension Father   . Diabetes Father   . Hyperlipidemia  Father   . Breast cancer Mother 69  . Cancer Mother        breast  . Thyroid disease Mother   . Sleep apnea Mother   . Colon cancer Paternal Grandfather        died at age 89  . Cancer Paternal Grandfather        GI CA  . Obesity Sister   . Diabetes Sister   . Hypertension Sister   . Heart disease Maternal Grandmother        MI  . Cancer Maternal Grandfather        lung, smoker  . Diabetes Maternal Grandfather   . Diabetes Paternal Grandmother   . Heart disease Paternal Grandmother   . Pancreatitis Sister   . Esophageal cancer Neg Hx   . Stomach cancer Neg Hx   . Stroke Neg Hx   . Colon polyps Neg Hx   . Rectal cancer Neg Hx   . Pancreatic cancer Neg Hx     ROS: Review of Systems  Constitutional: Positive for malaise/fatigue and weight loss.  Gastrointestinal: Negative for nausea and vomiting.  Musculoskeletal:       Negative muscle weakness    PHYSICAL EXAM: Blood pressure 121/78, pulse 76, temperature 98.4 F (36.9 C), temperature source Oral, height 5\' 5"  (1.651 m), weight 207 lb (93.9 kg), SpO2 96 %. Body mass index is 34.45 kg/m. Physical Exam  Constitutional: She is oriented to person, place, and time. She appears well-developed and well-nourished.  Cardiovascular: Normal rate.  Pulmonary/Chest: Effort normal.  Musculoskeletal: Normal range of motion.  Neurological: She is oriented to person, place, and time.  Skin: Skin is warm and dry.  Psychiatric: She has a normal mood and affect. Her behavior is normal.  Vitals reviewed.   RECENT LABS AND TESTS: BMET    Component Value Date/Time   NA 141 10/06/2017 0850   K 4.1 10/06/2017 0850   CL 102 10/06/2017 0850   CO2 24 10/06/2017 0850   GLUCOSE 76 10/06/2017 0850   GLUCOSE 85 11/22/2016 1521   GLUCOSE 86 11/13/2009   BUN 10 10/06/2017 0850   CREATININE 0.67 10/06/2017 0850   CREATININE 0.67 11/22/2016 1521   CALCIUM 9.0 10/06/2017 0850   GFRNONAA 101 10/06/2017 0850   GFRAA 117 10/06/2017 0850    Lab Results  Component Value Date   HGBA1C 5.2 10/06/2017   HGBA1C 5.1 04/06/2014   Lab Results  Component Value Date   INSULIN 8.8  10/06/2017   CBC    Component Value Date/Time   WBC 9.6 10/06/2017 0850   WBC 9.8 11/22/2016 1521   RBC 4.71 10/06/2017 0850   RBC 4.41 11/22/2016 1521   HGB 13.8 10/06/2017 0850   HCT 42.4 10/06/2017 0850   PLT 328 11/22/2016 1521   PLT 381 11/13/2009   MCV 90 10/06/2017 0850   MCH 29.3 10/06/2017 0850   MCH 29.7 11/22/2016 1521   MCHC 32.5 10/06/2017 0850   MCHC 32.2 11/22/2016 1521   RDW 13.5 10/06/2017 0850   LYMPHSABS 1.8 10/06/2017 0850   MONOABS 0.5 05/26/2015 0931   EOSABS 0.1 10/06/2017 0850   BASOSABS 0.1 10/06/2017 0850   Iron/TIBC/Ferritin/ %Sat    Component Value Date/Time   FERRITIN 50 11/09/2008   Lipid Panel     Component Value Date/Time   CHOL 140 10/06/2017 0850   TRIG 112 10/06/2017 0850   TRIG 138 11/13/2009 1337   HDL 48 10/06/2017 0850   CHOLHDL 3.9 11/22/2016 1521   VLDL 32 (H) 11/22/2016 1521   LDLCALC 70 10/06/2017 0850   Hepatic Function Panel     Component Value Date/Time   PROT 6.9 10/06/2017 0850   ALBUMIN 4.4 10/06/2017 0850   AST 16 10/06/2017 0850   ALT 17 10/06/2017 0850   ALKPHOS 116 10/06/2017 0850   BILITOT 0.4 10/06/2017 0850   BILIDIR 0.1 05/26/2015 0931      Component Value Date/Time   TSH 1.690 10/06/2017 0850   TSH 1.30 11/22/2016 1521   TSH 1.67 05/26/2015 0931    ASSESSMENT AND PLAN: Vitamin D deficiency - Plan: Vitamin D, Ergocalciferol, (DRISDOL) 50000 units CAPS capsule  At risk for osteoporosis  Class 1 obesity with serious comorbidity and body mass index (BMI) of 34.0 to 34.9 in adult, unspecified obesity type  PLAN:  Vitamin D Deficiency Hailey Noble was informed that low vitamin D levels contributes to fatigue and are associated with obesity, breast, and colon cancer. She agrees to continue to take prescription Vit D @50 ,000 IU every week #4 with no refills and  continue OTC Vit D 4,000 IU daily and will follow up for routine testing of vitamin D, at least 2-3 times per year. She was informed of the risk of over-replacement of vitamin D and agrees to not increase her dose unless he discusses this with [name] first. Hailey Noble agrees to follow up with our clinic in 2 to 3 weeks.  At risk for osteopenia and osteoporosis Hailey Noble is at risk for osteopenia and osteoporosis due to her vitamin D deficiency. She was encouraged to take her vitamin D and follow her higher calcium diet and increase strengthening exercise to help strengthen her bones and decrease her risk of osteopenia and osteoporosis.  Obesity Hailey Noble is currently in the action stage of change. As such, her goal is to continue with weight loss efforts She has agreed to follow the Category 2 plan +100 calories Hailey Noble has been instructed to work up to a goal of 150 minutes of combined cardio and strengthening exercise per week or 15 minutes per day of increased resistance training and 10,000 steps daily for weight loss and overall health benefits. We discussed the following Behavioral Modification Strategies today: better snacking choices, holiday eating strategies and celebration eating strategies  Hailey Noble has agreed to follow up with our clinic in 2 to 3 weeks. She was informed of the importance of frequent follow up visits to maximize her success with intensive lifestyle modifications for her multiple health conditions.  I, Hailey Noble, am acting as transcriptionist for Hailey Nip, MD  I have reviewed the above documentation for accuracy and completeness, and I agree with the above. -Hailey Nip, MD    OBESITY BEHAVIORAL INTERVENTION VISIT  Today's visit was # 3 out of 22.  Starting weight: 212 lbs Starting date: 10/06/17 Today's weight : 207 lbs  Today's date: 11/10/2017 Total lbs lost to date: 5 (Patients must lose 7 lbs in the first 6 months to continue with counseling)   ASK: We  discussed the diagnosis of obesity with Hailey Noble today and Hailey Noble agreed to give [name] permission to discuss obesity behavioral modification therapy today.  ASSESS: Calla has the diagnosis of obesity and her BMI today is 34.45 Athalene is in the action stage of change   ADVISE: Hadiyah was educated on the multiple health risks of obesity as well as the benefit of weight loss to improve her health. She was advised of the need for long term treatment and the importance of lifestyle modifications.  AGREE: Multiple dietary modification options and treatment options were discussed and  Spring agreed to follow the Category 2 plan +100 calories We discussed the following Behavioral Modification Strategies today: better snacking choices, holiday eating strategies and celebration eating strategies

## 2017-11-12 ENCOUNTER — Other Ambulatory Visit: Payer: Self-pay | Admitting: Family Medicine

## 2017-11-17 ENCOUNTER — Telehealth: Payer: Self-pay | Admitting: Family Medicine

## 2017-11-17 MED FILL — VIT D2 1.25 MG (50,000 UNIT: 1.25 MG | 28 days supply | Qty: 4 | Fill #0

## 2017-11-17 NOTE — Telephone Encounter (Signed)
No she can skip the labs for now. We can review Dr Migdalia Dk labs at visit and decide if we need any further labs after that.

## 2017-11-17 NOTE — Telephone Encounter (Signed)
rec'd return call from pt.  Wanted to make Dr. Charlett Blake aware that Dr. Leafy Ro did a series of lab tests (in Epic), and she questioned if she will need to have the lab work that has been sched. Prior to her physical in December.  Please inform the pt.

## 2017-11-17 NOTE — Telephone Encounter (Signed)
Called pt. To clarify her question about lab work. Left message to return call.

## 2017-11-17 NOTE — Telephone Encounter (Signed)
Copied from [place] [id number]. Topic: Quick Communication - See Telephone Encounter >> Nov 17, 2017  1:04 PM Synthia Innocent wrote: CRM for notification. See Telephone encounter for:  Has labs on 11/24/17, Dr Leafy Ro drawled 10/07/17, does this need to repeat or anything added. Please advise 11/17/17.

## 2017-11-18 MED FILL — ESCITALOPRAM 10 MG TABLET: 10 | 30 days supply | Qty: 30 | Fill #0

## 2017-11-18 NOTE — Telephone Encounter (Signed)
Spoke w/ Pt, informed of below. Pt verbalized understanding.

## 2017-11-19 DIAGNOSIS — R102 Pelvic and perineal pain: Secondary | ICD-10-CM | POA: Diagnosis not present

## 2017-11-19 DIAGNOSIS — D251 Intramural leiomyoma of uterus: Secondary | ICD-10-CM | POA: Diagnosis not present

## 2017-11-19 DIAGNOSIS — R1031 Right lower quadrant pain: Secondary | ICD-10-CM | POA: Diagnosis not present

## 2017-11-24 ENCOUNTER — Other Ambulatory Visit: Payer: 59

## 2017-11-28 ENCOUNTER — Encounter: Payer: Self-pay | Admitting: Family Medicine

## 2017-11-28 ENCOUNTER — Ambulatory Visit (INDEPENDENT_AMBULATORY_CARE_PROVIDER_SITE_OTHER): Payer: 59 | Admitting: Family Medicine

## 2017-11-28 VITALS — BP 125/67 | HR 81 | Temp 98.0°F | Resp 16 | Ht 65.0 in | Wt 210.8 lb

## 2017-11-28 DIAGNOSIS — E559 Vitamin D deficiency, unspecified: Secondary | ICD-10-CM | POA: Diagnosis not present

## 2017-11-28 DIAGNOSIS — Z0000 Encounter for general adult medical examination without abnormal findings: Secondary | ICD-10-CM | POA: Diagnosis not present

## 2017-11-28 DIAGNOSIS — Z23 Encounter for immunization: Secondary | ICD-10-CM

## 2017-11-28 DIAGNOSIS — E782 Mixed hyperlipidemia: Secondary | ICD-10-CM | POA: Diagnosis not present

## 2017-11-28 DIAGNOSIS — E669 Obesity, unspecified: Secondary | ICD-10-CM

## 2017-11-28 DIAGNOSIS — R1031 Right lower quadrant pain: Secondary | ICD-10-CM | POA: Insufficient documentation

## 2017-11-28 DIAGNOSIS — E8881 Metabolic syndrome: Secondary | ICD-10-CM

## 2017-11-28 MED ORDER — ESCITALOPRAM OXALATE 10 MG PO TABS: 10.0000 mg | ORAL_TABLET | Freq: Every day | ORAL | 3 refills | Status: DC

## 2017-11-28 NOTE — Progress Notes (Signed)
Subjective:  I acted as a Education administrator for [employer]. Hailey Noble, [place]   Patient ID: Hailey Noble, female    DOB: 05-15-1966, 51 y.o.   MRN: [id number]  Chief Complaint  Patient presents with  . Annual Exam    HPI  Patient is in today for annual exam and is feeling well. She was struggling with RLQ pain but is following with GYN and with IUD out is better. Was noted to have endometrial thickening and had 24 hours of vaginal bleeding so is undergoing a biopsy. No complaints today Is following with the bariatric program and is pleased with her plan. Is adhering to the 20 gm of protein and no carbs due to her insulin resistance and tolerating this well, no polyuria or polydipsia. Is trying to stay active. Denies CP/palp/SOB/HA/congestion/fevers/GI or GU c/o. Taking meds as prescribed  Patient Care Team: Mosie Lukes, MD as PCP - General (Family Medicine) Key, Nelia Shi, NP as Nurse Practitioner (Gynecology) Paula Compton, MD as Consulting Physician (Obstetrics and Gynecology) Rosemary Holms, DPM as Consulting Physician (Podiatry)   Past Medical History:  Diagnosis Date  . Allergic state 12/08/2016  . Allergy   . Anxiety   . Back pain   . GERD (gastroesophageal reflux disease)   . Hyperlipidemia 06/09/2017  . Joint pain   . Knee pain, right 03/10/2017  . Plantar fasciitis   . Shortness of breath   . Sinusitis   . Vitamin D deficiency 11/22/2016  . Vitamin D deficiency     Past Surgical History:  Procedure Laterality Date  . COLONOSCOPY  02-2009   with Henrene Pastor normal  . ESOPHAGOGASTRODUODENOSCOPY  2012  . GANGLION CYST EXCISION Right    right- wrist   . NASAL SEPTUM SURGERY    . NASAL SINUS SURGERY    . TMJ ARTHROSCOPY     left    Family History  Problem Relation Age of Onset  . Hypertension Father   . Diabetes Father   . Hyperlipidemia Father   . Breast cancer Mother 10  . Cancer Mother        breast  . Thyroid disease Mother   . Sleep apnea Mother   . Colon cancer  Paternal Grandfather        died at age 16  . Cancer Paternal Grandfather        GI CA  . Obesity Sister   . Diabetes Sister   . Hypertension Sister   . Heart disease Maternal Grandmother        MI  . Cancer Maternal Grandfather        lung, smoker  . Diabetes Maternal Grandfather   . Diabetes Paternal Grandmother   . Heart disease Paternal Grandmother   . Pancreatitis Sister   . Esophageal cancer Neg Hx   . Stomach cancer Neg Hx   . Stroke Neg Hx   . Colon polyps Neg Hx   . Rectal cancer Neg Hx   . Pancreatic cancer Neg Hx     Social History   Socioeconomic History  . Marital status: Single    Spouse name: Not on file  . Number of children: Not on file  . Years of education: Not on file  . Highest education level: Not on file  Social Needs  . Financial resource strain: Not on file  . Food insecurity - worry: Not on file  . Food insecurity - inability: Not on file  . Transportation needs - medical: Not on file  .  Transportation needs - non-medical: Not on file  Occupational History  . Occupation: Programmer, multimedia: [employer]  Tobacco Use  . Smoking status: Never Smoker  . Smokeless tobacco: Never Used  Substance and Sexual Activity  . Alcohol use: Yes    Alcohol/week: 0.0 oz    Comment: rare  . Drug use: No  . Sexual activity: Yes    Birth control/protection: IUD  Other Topics Concern  . 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 Works at [employer]. No dietary restrictions. Lives with dog    Outpatient Medications Prior to Visit  Medication Sig Dispense Refill  . acetaminophen (TYLENOL) 650 MG CR tablet Take 650 mg by mouth every 8 (eight) hours as needed. pain    . Ascorbic Acid (VITAMIN C) 1000 MG tablet Take 1,000 mg by mouth daily.    Marland Kitchen aspirin EC 81 MG tablet Take 81 mg by mouth daily.    . Calcium Carbonate-Vit D-Min (CALCIUM 1200 PO) Take by mouth daily.      . cetirizine  (ZYRTEC) 10 MG tablet Take 10 mg by mouth daily.    . Cholecalciferol (VITAMIN D-1000 MAX ST PO) Take 1 tablet by mouth.    . famotidine (PEPCID) 20 MG tablet Take 20 mg daily as needed by mouth.     Marland Kitchen ibuprofen (ADVIL,MOTRIN) 200 MG tablet Take 200 mg by mouth every 6 (six) hours as needed. pain    . KRILL OIL 1000 MG CAPS Take 1 capsule by mouth daily.    . mometasone (NASONEX) 50 MCG/ACT nasal spray Place 2 sprays into the nose daily. 17 g 1  . Polyethyl Glycol-Propyl Glycol (SYSTANE FREE OP) Apply 1 drop to eye daily.    . Vitamin D, Ergocalciferol, (DRISDOL) 50000 units CAPS capsule Take 1 capsule (50,000 Units total) every 7 (seven) days by mouth. 4 capsule 0  . escitalopram (LEXAPRO) 10 MG tablet TAKE 1 TABLET (10 MG TOTAL) BY MOUTH DAILY. 30 tablet 0   No facility-administered medications prior to visit.     Allergies  Allergen Reactions  . Coconut Fatty Acids Hives  . Codeine   . Hydrocod Polst-Cpm Polst Er     REACTION: rash on face and head, itching  . Mango Flavor Hives  . [name] Other (See Comments)  . Oxycodone-Acetaminophen     REACTION: rash on face and head, itching  . Tussin Cough [Dextromethorphan Hbr]     Broken out in hives  . Ultram [Tramadol] Other (See Comments)    Dizzy and low blood pressure.     Review of Systems  Constitutional: Negative for chills, fever and malaise/fatigue.  HENT: Negative for congestion and hearing loss.   Eyes: Negative for discharge.  Respiratory: Negative for cough, sputum production and shortness of breath.   Cardiovascular: Negative for chest pain, palpitations and leg swelling.  Gastrointestinal: Negative for abdominal pain, blood in stool, constipation, diarrhea, heartburn, nausea and vomiting.  Genitourinary: Negative for dysuria, frequency, hematuria and urgency.  Musculoskeletal: Negative for back pain, falls and myalgias.  Skin: Negative for rash.  Neurological: Negative for dizziness, sensory change, loss of  consciousness, weakness and headaches.  Endo/Heme/Allergies: Negative for environmental allergies. Does not bruise/bleed easily.  Psychiatric/Behavioral: Negative for depression and suicidal ideas. The patient is not nervous/anxious and does not have insomnia.        Objective:    Physical Exam  Constitutional: She is  oriented to person, place, and time. She appears well-developed and well-nourished. No distress.  HENT:  Head: Normocephalic and atraumatic.  Eyes: Conjunctivae are normal.  Neck: Neck supple. No thyromegaly present.  Cardiovascular: Normal rate, regular rhythm and normal heart sounds.  No murmur heard. Pulmonary/Chest: Effort normal and breath sounds normal. No respiratory distress.  Abdominal: Soft. Bowel sounds are normal. She exhibits no distension and no mass. There is no tenderness.  Musculoskeletal: She exhibits no edema.  Lymphadenopathy:    She has no cervical adenopathy.  Neurological: She is alert and oriented to person, place, and time.  Skin: Skin is warm and dry.  Psychiatric: She has a normal mood and affect. Her behavior is normal.    BP 125/67 (BP Location: Left Arm, Patient Position: Sitting, Cuff Size: Large)   Pulse 81   Temp 98 F (36.7 C) (Oral)   Resp 16   Ht 5\' 5"  (1.651 m)   Wt 210 lb 12.8 oz (95.6 kg)   SpO2 97%   BMI 35.08 kg/m  Wt Readings from Last 3 Encounters:  11/28/17 210 lb 12.8 oz (95.6 kg)  11/10/17 207 lb (93.9 kg)  10/21/17 209 lb (94.8 kg)   BP Readings from Last 3 Encounters:  11/28/17 125/67  11/10/17 121/78  10/21/17 138/79     Immunization History  Administered Date(s) Administered  . Influenza Split 09/22/2012, 10/01/2013  . Influenza,inj,Quad PF,6+ Mos 09/22/2014  . Tdap 04/06/2014  . Zoster Recombinat (Shingrix) 06/09/2017    Health Maintenance  Topic Date Due  . HIV Screening  05/02/1980  . MAMMOGRAM  12/09/2017  . PAP SMEAR  11/25/2018  . COLONOSCOPY  05/02/2021  . TETANUS/TDAP  04/06/2024  .  INFLUENZA VACCINE  Completed    Lab Results  Component Value Date   WBC 9.6 10/06/2017   HGB 13.8 10/06/2017   HCT 42.4 10/06/2017   PLT 328 11/22/2016   GLUCOSE 76 10/06/2017   CHOL 140 10/06/2017   TRIG 112 10/06/2017   HDL 48 10/06/2017   LDLCALC 70 10/06/2017   ALT 17 10/06/2017   AST 16 10/06/2017   NA 141 10/06/2017   K 4.1 10/06/2017   CL 102 10/06/2017   CREATININE 0.67 10/06/2017   BUN 10 10/06/2017   CO2 24 10/06/2017   TSH 1.690 10/06/2017   HGBA1C 5.2 10/06/2017    Lab Results  Component Value Date   TSH 1.690 10/06/2017   Lab Results  Component Value Date   WBC 9.6 10/06/2017   HGB 13.8 10/06/2017   HCT 42.4 10/06/2017   MCV 90 10/06/2017   PLT 328 11/22/2016   Lab Results  Component Value Date   NA 141 10/06/2017   K 4.1 10/06/2017   CO2 24 10/06/2017   GLUCOSE 76 10/06/2017   BUN 10 10/06/2017   CREATININE 0.67 10/06/2017   BILITOT 0.4 10/06/2017   ALKPHOS 116 10/06/2017   AST 16 10/06/2017   ALT 17 10/06/2017   PROT 6.9 10/06/2017   ALBUMIN 4.4 10/06/2017   CALCIUM 9.0 10/06/2017   GFR 112.44 05/26/2015   Lab Results  Component Value Date   CHOL 140 10/06/2017   Lab Results  Component Value Date   HDL 48 10/06/2017   Lab Results  Component Value Date   LDLCALC 70 10/06/2017   Lab Results  Component Value Date   TRIG 112 10/06/2017   Lab Results  Component Value Date   CHOLHDL 3.9 11/22/2016   Lab Results  Component Value Date  HGBA1C 5.2 10/06/2017         Assessment & Plan:   Problem List Items Addressed This Visit    General medical examination    Patient encouraged to maintain heart healthy diet, regular exercise, adequate sleep. Consider daily probiotics. Take medications as prescribed. Labs reviewed      Obesity (BMI 30-39.9)    Is following with Dr Leafy Ro and doing well on high protein low carb diet. .      Vitamin D deficiency    Taking vitamin D taking the 50000 IU weekly now. The goal is to get  the Vitamin D at 49.       Hyperlipidemia    Encouraged heart healthy diet, increase exercise, avoid trans fats, consider a krill oil cap daily      Insulin resistance    Is on a high protein low carb diet with good weight loss       Pain, abdominal, RLQ    Has an endometrial biopsy scheduled next week with Dr Marvel Plan and Dr Balinda Quails to r/o pathology. Had 1 episode of vaginal bleeding with pain, now resolved. LMP 2011 prior to IUD which had to be surgically removed because it was embedded.         I am having Amariyana I. Barlett maintain her Calcium Carbonate-Vit D-Min (CALCIUM 1200 PO), acetaminophen, ibuprofen, Polyethyl Glycol-Propyl Glycol (SYSTANE FREE OP), Krill Oil, cetirizine, vitamin C, famotidine, Cholecalciferol (VITAMIN D-1000 MAX ST PO), aspirin EC, mometasone, Vitamin D (Ergocalciferol), and escitalopram.  Meds ordered this encounter  Medications  . escitalopram (LEXAPRO) 10 MG tablet    Sig: Take 1 tablet (10 mg total) by mouth daily.    Dispense:  90 tablet    Refill:  3    CMA served as scribe during this visit. History, Physical and Plan performed by medical provider. Documentation and orders reviewed and attested to.  Penni Homans, MD

## 2017-11-28 NOTE — Assessment & Plan Note (Addendum)
Has an endometrial biopsy scheduled next week with Dr Marvel Plan and Dr Balinda Quails to r/o pathology. Had 1 episode of vaginal bleeding with pain, now resolved. LMP 2011 prior to IUD which had to be surgically removed because it was embedded.

## 2017-11-28 NOTE — Assessment & Plan Note (Signed)
Is following with Dr Leafy Ro and doing well on high protein low carb diet. Marland Kitchen

## 2017-11-28 NOTE — Assessment & Plan Note (Signed)
Patient encouraged to maintain heart healthy diet, regular exercise, adequate sleep. Consider daily probiotics. Take medications as prescribed. Labs reviewed 

## 2017-11-28 NOTE — Assessment & Plan Note (Signed)
Encouraged heart healthy diet, increase exercise, avoid trans fats, consider a krill oil cap daily 

## 2017-11-28 NOTE — Assessment & Plan Note (Signed)
Taking vitamin D taking the 50000 IU weekly now. The goal is to get the Vitamin D at 49.

## 2017-11-28 NOTE — Patient Instructions (Signed)
Preventive Care 40-64 Years, Female Preventive care refers to lifestyle choices and visits with your health care provider that can promote health and wellness. What does preventive care include?  A yearly physical exam. This is also called an annual well check.  Dental exams once or twice a year.  Routine eye exams. Ask your health care provider how often you should have your eyes checked.  Personal lifestyle choices, including: ? Daily care of your teeth and gums. ? Regular physical activity. ? Eating a healthy diet. ? Avoiding tobacco and drug use. ? Limiting alcohol use. ? Practicing safe sex. ? Taking low-dose aspirin daily starting at age 58. ? Taking vitamin and mineral supplements as recommended by your health care provider. What happens during an annual well check? The services and screenings done by your health care provider during your annual well check will depend on your age, overall health, lifestyle risk factors, and family history of disease. Counseling Your health care provider may ask you questions about your:  Alcohol use.  Tobacco use.  Drug use.  Emotional well-being.  Home and relationship well-being.  Sexual activity.  Eating habits.  Work and work Statistician.  Method of birth control.  Menstrual cycle.  Pregnancy history.  Screening You may have the following tests or measurements:  Height, weight, and BMI.  Blood pressure.  Lipid and cholesterol levels. These may be checked every 5 years, or more frequently if you are over 81 years old.  Skin check.  Lung cancer screening. You may have this screening every year starting at age 78 if you have a 30-pack-year history of smoking and currently smoke or have quit within the past 15 years.  Fecal occult blood test (FOBT) of the stool. You may have this test every year starting at age 65.  Flexible sigmoidoscopy or colonoscopy. You may have a sigmoidoscopy every 5 years or a colonoscopy  every 10 years starting at age 30.  Hepatitis C blood test.  Hepatitis B blood test.  Sexually transmitted disease (STD) testing.  Diabetes screening. This is done by checking your blood sugar (glucose) after you have not eaten for a while (fasting). You may have this done every 1-3 years.  Mammogram. This may be done every 1-2 years. Talk to your health care provider about when you should start having regular mammograms. This may depend on whether you have a family history of breast cancer.  BRCA-related cancer screening. This may be done if you have a family history of breast, ovarian, tubal, or peritoneal cancers.  Pelvic exam and Pap test. This may be done every 3 years starting at age 80. Starting at age 36, this may be done every 5 years if you have a Pap test in combination with an HPV test.  Bone density scan. This is done to screen for osteoporosis. You may have this scan if you are at high risk for osteoporosis.  Discuss your test results, treatment options, and if necessary, the need for more tests with your health care provider. Vaccines Your health care provider may recommend certain vaccines, such as:  Influenza vaccine. This is recommended every year.  Tetanus, diphtheria, and acellular pertussis (Tdap, Td) vaccine. You may need a Td booster every 10 years.  Varicella vaccine. You may need this if you have not been vaccinated.  Zoster vaccine. You may need this after age 5.  Measles, mumps, and rubella (MMR) vaccine. You may need at least one dose of MMR if you were born in  1957 or later. You may also need a second dose.  Pneumococcal 13-valent conjugate (PCV13) vaccine. You may need this if you have certain conditions and were not previously vaccinated.  Pneumococcal polysaccharide (PPSV23) vaccine. You may need one or two doses if you smoke cigarettes or if you have certain conditions.  Meningococcal vaccine. You may need this if you have certain  conditions.  Hepatitis A vaccine. You may need this if you have certain conditions or if you travel or work in places where you may be exposed to hepatitis A.  Hepatitis B vaccine. You may need this if you have certain conditions or if you travel or work in places where you may be exposed to hepatitis B.  Haemophilus influenzae type b (Hib) vaccine. You may need this if you have certain conditions.  Talk to your health care provider about which screenings and vaccines you need and how often you need them. This information is not intended to replace advice given to you by your health care provider. Make sure you discuss any questions you have with your health care provider. Document Released: 01/05/2016 Document Revised: 08/28/2016 Document Reviewed: 10/10/2015 Elsevier Interactive Patient Education  2017 [employer].

## 2017-11-28 NOTE — Assessment & Plan Note (Signed)
Is on a high protein low carb diet with good weight loss

## 2017-12-01 ENCOUNTER — Ambulatory Visit (INDEPENDENT_AMBULATORY_CARE_PROVIDER_SITE_OTHER): Payer: 59 | Admitting: Family Medicine

## 2017-12-03 ENCOUNTER — Ambulatory Visit (INDEPENDENT_AMBULATORY_CARE_PROVIDER_SITE_OTHER): Payer: 59 | Admitting: Physician Assistant

## 2017-12-03 VITALS — BP 129/80 | HR 74 | Temp 97.7°F | Ht 65.0 in | Wt 208.0 lb

## 2017-12-03 DIAGNOSIS — Z6841 Body Mass Index (BMI) 40.0 and over, adult: Secondary | ICD-10-CM

## 2017-12-03 DIAGNOSIS — E559 Vitamin D deficiency, unspecified: Secondary | ICD-10-CM

## 2017-12-03 DIAGNOSIS — N938 Other specified abnormal uterine and vaginal bleeding: Secondary | ICD-10-CM | POA: Diagnosis not present

## 2017-12-03 DIAGNOSIS — Z3202 Encounter for pregnancy test, result negative: Secondary | ICD-10-CM | POA: Diagnosis not present

## 2017-12-03 LAB — HM PAP SMEAR

## 2017-12-04 NOTE — Progress Notes (Signed)
Office: [phone]  /  Fax: [phone]   HPI:   Chief Complaint: OBESITY Hailey Noble is here to discuss her progress with her obesity treatment plan. She is on the Category 2 plan +100 calories and is following her eating plan approximately 100 % of the time. She states she is exercising elliptical, swimming and resistance training for 30 minutes 3 times per week. [name] is mindful of her eating but states she has deviated more during the past 2 weeks. She would like more variety with her meals. Her weight is 208 lb (94.3 kg) today and has had a weight gain of 1 pound over a period of 3 weeks since her last visit. She has lost 4 lbs since starting treatment with [name].  Vitamin D deficiency [name] has a diagnosis of vitamin D deficiency. She is currently taking vit D and denies nausea, vomiting or muscle weakness.  ALLERGIES: Allergies  Allergen Reactions  . Coconut Fatty Acids Hives  . Codeine   . Hydrocod Polst-Cpm Polst Er     REACTION: rash on face and head, itching  . Mango Flavor Hives  . [name] Other (See Comments)  . Oxycodone-Acetaminophen     REACTION: rash on face and head, itching  . Tussin Cough [Dextromethorphan Hbr]     Broken out in hives  . Ultram [Tramadol] Other (See Comments)    Dizzy and low blood pressure.     MEDICATIONS: Current Outpatient Medications on File Prior to Visit  Medication Sig Dispense Refill  . acetaminophen (TYLENOL) 650 MG CR tablet Take 650 mg by mouth every 8 (eight) hours as needed. pain    . Calcium Carbonate-Vit D-Min (CALCIUM 1200 PO) Take by mouth daily.      . cetirizine (ZYRTEC) 10 MG tablet Take 10 mg by mouth daily.    . Cholecalciferol (VITAMIN D-1000 MAX ST PO) Take 1 tablet by mouth.    . escitalopram (LEXAPRO) 10 MG tablet Take 1 tablet (10 mg total) by mouth daily. 90 tablet 3  . famotidine (PEPCID) 20 MG tablet Take 20 mg daily as needed by mouth.     Marland Kitchen KRILL OIL 1000 MG CAPS Take 1 capsule by mouth daily.    .  mometasone (NASONEX) 50 MCG/ACT nasal spray Place 2 sprays into the nose daily. 17 g 1  . Polyethyl Glycol-Propyl Glycol (SYSTANE FREE OP) Apply 1 drop to eye daily.    . Vitamin D, Ergocalciferol, (DRISDOL) 50000 units CAPS capsule Take 1 capsule (50,000 Units total) every 7 (seven) days by mouth. 4 capsule 0   No current facility-administered medications on file prior to visit.     PAST MEDICAL HISTORY: Past Medical History:  Diagnosis Date  . Allergic state 12/08/2016  . Allergy   . Anxiety   . Back pain   . GERD (gastroesophageal reflux disease)   . Hyperlipidemia 06/09/2017  . Joint pain   . Knee pain, right 03/10/2017  . Plantar fasciitis   . Shortness of breath   . Sinusitis   . Vitamin D deficiency 11/22/2016  . Vitamin D deficiency     PAST SURGICAL HISTORY: Past Surgical History:  Procedure Laterality Date  . COLONOSCOPY  02-2009   with Henrene Pastor normal  . ESOPHAGOGASTRODUODENOSCOPY  2012  . GANGLION CYST EXCISION Right    right- wrist   . NASAL SEPTUM SURGERY    . NASAL SINUS SURGERY    . TMJ ARTHROSCOPY     left    SOCIAL HISTORY: Social History  Tobacco Use  . Smoking status: Never Smoker  . Smokeless tobacco: Never Used  Substance Use Topics  . Alcohol use: Yes    Alcohol/week: 0.0 oz    Comment: rare  . Drug use: No    FAMILY HISTORY: Family History  Problem Relation Age of Onset  . Hypertension Father   . Diabetes Father   . Hyperlipidemia Father   . Breast cancer Mother 75  . Cancer Mother        breast  . Thyroid disease Mother   . Sleep apnea Mother   . Colon cancer Paternal Grandfather        died at age 17  . Cancer Paternal Grandfather        GI CA  . Obesity Sister   . Diabetes Sister   . Hypertension Sister   . Heart disease Maternal Grandmother        MI  . Cancer Maternal Grandfather        lung, smoker  . Diabetes Maternal Grandfather   . Diabetes Paternal Grandmother   . Heart disease Paternal Grandmother   .  Pancreatitis Sister   . Esophageal cancer Neg Hx   . Stomach cancer Neg Hx   . Stroke Neg Hx   . Colon polyps Neg Hx   . Rectal cancer Neg Hx   . Pancreatic cancer Neg Hx     ROS: Review of Systems  Constitutional: Negative for weight loss.  Gastrointestinal: Negative for nausea and vomiting.  Musculoskeletal:       Negative muscle weakness    PHYSICAL EXAM: Blood pressure 129/80, pulse 74, temperature 97.7 F (36.5 C), temperature source Oral, height 5\' 5"  (1.651 m), weight 208 lb (94.3 kg), SpO2 98 %. Body mass index is 34.61 kg/m. Physical Exam  Constitutional: She is oriented to person, place, and time. She appears well-developed and well-nourished.  Cardiovascular: Normal rate.  Pulmonary/Chest: Effort normal.  Musculoskeletal: Normal range of motion.  Neurological: She is oriented to person, place, and time.  Skin: Skin is warm and dry.  Psychiatric: She has a normal mood and affect. Her behavior is normal.  Vitals reviewed.   RECENT LABS AND TESTS: BMET    Component Value Date/Time   NA 141 10/06/2017 0850   K 4.1 10/06/2017 0850   CL 102 10/06/2017 0850   CO2 24 10/06/2017 0850   GLUCOSE 76 10/06/2017 0850   GLUCOSE 85 11/22/2016 1521   GLUCOSE 86 11/13/2009   BUN 10 10/06/2017 0850   CREATININE 0.67 10/06/2017 0850   CREATININE 0.67 11/22/2016 1521   CALCIUM 9.0 10/06/2017 0850   GFRNONAA 101 10/06/2017 0850   GFRAA 117 10/06/2017 0850   Lab Results  Component Value Date   HGBA1C 5.2 10/06/2017   HGBA1C 5.1 04/06/2014   Lab Results  Component Value Date   INSULIN 8.8 10/06/2017   CBC    Component Value Date/Time   WBC 9.6 10/06/2017 0850   WBC 9.8 11/22/2016 1521   RBC 4.71 10/06/2017 0850   RBC 4.41 11/22/2016 1521   HGB 13.8 10/06/2017 0850   HCT 42.4 10/06/2017 0850   PLT 328 11/22/2016 1521   PLT 381 11/13/2009   MCV 90 10/06/2017 0850   MCH 29.3 10/06/2017 0850   MCH 29.7 11/22/2016 1521   MCHC 32.5 10/06/2017 0850   MCHC 32.2  11/22/2016 1521   RDW 13.5 10/06/2017 0850   LYMPHSABS 1.8 10/06/2017 0850   MONOABS 0.5 05/26/2015 0931   EOSABS 0.1 10/06/2017  0850   BASOSABS 0.1 10/06/2017 0850   Iron/TIBC/Ferritin/ %Sat    Component Value Date/Time   FERRITIN 50 11/09/2008   Lipid Panel     Component Value Date/Time   CHOL 140 10/06/2017 0850   TRIG 112 10/06/2017 0850   TRIG 138 11/13/2009 1337   HDL 48 10/06/2017 0850   CHOLHDL 3.9 11/22/2016 1521   VLDL 32 (H) 11/22/2016 1521   LDLCALC 70 10/06/2017 0850   Hepatic Function Panel     Component Value Date/Time   PROT 6.9 10/06/2017 0850   ALBUMIN 4.4 10/06/2017 0850   AST 16 10/06/2017 0850   ALT 17 10/06/2017 0850   ALKPHOS 116 10/06/2017 0850   BILITOT 0.4 10/06/2017 0850   BILIDIR 0.1 05/26/2015 0931      Component Value Date/Time   TSH 1.690 10/06/2017 0850   TSH 1.30 11/22/2016 1521   TSH 1.67 05/26/2015 0931    ASSESSMENT AND PLAN: Vitamin D deficiency  Class 3 severe obesity with serious comorbidity and body mass index (BMI) of 40.0 to 44.9 in adult, unspecified obesity type (HCC)  PLAN:  Vitamin D Deficiency Veronda was informed that low vitamin D levels contributes to fatigue and are associated with obesity, breast, and colon cancer. She agrees to continue to take prescription Vit D @50 ,000 IU every week and will follow up for routine testing of vitamin D, at least 2-3 times per year. She was informed of the risk of over-replacement of vitamin D and agrees to not increase her dose unless he discusses this with [name] first.  We spent > than 50% of the 15 minute visit on the counseling as documented in the note.  Obesity Tymara is currently in the action stage of change. As such, her goal is to continue with weight loss efforts She has agreed to keep a food journal with 1200 to 1300 calories and 85 grams of protein daily Kensli has been instructed to work up to a goal of 150 minutes of combined cardio and strengthening exercise per  week for weight loss and overall health benefits. We discussed the following Behavioral Modification Strategies today: keep a strict food journal, increasing lean protein intake and work on meal planning and easy cooking plans  Dametra has agreed to follow up with our clinic in 3 weeks. She was informed of the importance of frequent follow up visits to maximize her success with intensive lifestyle modifications for her multiple health conditions.  I, Doreene Nest, am acting as transcriptionist for Lacy Duverney, PA-C  I have reviewed the above documentation for accuracy and completeness, and I agree with the above. -Lacy Duverney, PA-C  I have reviewed the above note and agree with the plan. -Dennard Nip, MD   OBESITY BEHAVIORAL INTERVENTION VISIT  Today's visit was # 4 out of 22.  Starting weight: 212 lbs Starting date: 10/06/17 Today's weight :208 lbs Today's date: 12/03/2017 Total lbs lost to date: 4 (Patients must lose 7 lbs in the first 6 months to continue with counseling)   ASK: We discussed the diagnosis of obesity with Hebert Soho today and Velma agreed to give [name] permission to discuss obesity behavioral modification therapy today.  ASSESS: Miyonna has the diagnosis of obesity and her BMI today is 34.61 Zyara is in the action stage of change   ADVISE: Tremeka was educated on the multiple health risks of obesity as well as the benefit of weight loss to improve her health. She was advised of the need for long  term treatment and the importance of lifestyle modifications.  AGREE: Multiple dietary modification options and treatment options were discussed and  Shelvy agreed to keep a food journal with 1200 to 1300 calories and 85 grams of protein daily We discussed the following Behavioral Modification Strategies today: keep a strict food journal, increasing lean protein intake and work on meal planning and easy cooking plans

## 2017-12-12 DIAGNOSIS — Z1231 Encounter for screening mammogram for malignant neoplasm of breast: Secondary | ICD-10-CM | POA: Diagnosis not present

## 2017-12-12 DIAGNOSIS — Z803 Family history of malignant neoplasm of breast: Secondary | ICD-10-CM | POA: Diagnosis not present

## 2017-12-12 LAB — HM MAMMOGRAPHY

## 2017-12-15 MED FILL — ESCITALOPRAM 10 MG TABLET: 10 | 90 days supply | Qty: 90 | Fill #0

## 2017-12-22 ENCOUNTER — Telehealth: Payer: 59 | Admitting: Family

## 2017-12-22 DIAGNOSIS — J019 Acute sinusitis, unspecified: Secondary | ICD-10-CM

## 2017-12-22 MED ORDER — AMOXICILLIN-POT CLAVULANATE 875-125 MG PO TABS: 1.0000 | ORAL_TABLET | Freq: Two times a day (BID) | ORAL | 0 refills | Status: DC

## 2017-12-22 MED FILL — AMOX TR-K CLV 875-125 MG TA: 875-125 | 7 days supply | Qty: 14 | Fill #0

## 2017-12-22 NOTE — Progress Notes (Signed)

## 2017-12-24 DIAGNOSIS — H04123 Dry eye syndrome of bilateral lacrimal glands: Secondary | ICD-10-CM | POA: Diagnosis not present

## 2017-12-24 DIAGNOSIS — Z01419 Encounter for gynecological examination (general) (routine) without abnormal findings: Secondary | ICD-10-CM | POA: Diagnosis not present

## 2017-12-25 ENCOUNTER — Encounter (INDEPENDENT_AMBULATORY_CARE_PROVIDER_SITE_OTHER): Payer: Self-pay

## 2017-12-25 ENCOUNTER — Ambulatory Visit (INDEPENDENT_AMBULATORY_CARE_PROVIDER_SITE_OTHER): Payer: 59 | Admitting: Family Medicine

## 2017-12-29 ENCOUNTER — Ambulatory Visit (INDEPENDENT_AMBULATORY_CARE_PROVIDER_SITE_OTHER): Payer: 59 | Admitting: Physician Assistant

## 2017-12-29 VITALS — BP 125/78 | HR 78 | Temp 97.8°F | Ht 65.0 in | Wt 206.0 lb

## 2017-12-29 DIAGNOSIS — Z6834 Body mass index (BMI) 34.0-34.9, adult: Secondary | ICD-10-CM

## 2017-12-29 DIAGNOSIS — E8881 Metabolic syndrome: Secondary | ICD-10-CM | POA: Diagnosis not present

## 2017-12-29 DIAGNOSIS — E559 Vitamin D deficiency, unspecified: Secondary | ICD-10-CM | POA: Diagnosis not present

## 2017-12-29 DIAGNOSIS — Z9189 Other specified personal risk factors, not elsewhere classified: Secondary | ICD-10-CM

## 2017-12-29 DIAGNOSIS — E669 Obesity, unspecified: Secondary | ICD-10-CM | POA: Diagnosis not present

## 2017-12-29 MED ORDER — VITAMIN D (ERGOCALCIFEROL) 1.25 MG (50000 UNIT) PO CAPS: 50000.0000 [IU] | ORAL_CAPSULE | ORAL | 0 refills | Status: DC

## 2017-12-29 MED FILL — VIT D2 1.25 MG (50,000 UNIT: 1.25 MG | 28 days supply | Qty: 4 | Fill #0

## 2017-12-29 NOTE — Progress Notes (Addendum)
Office: [phone]  /  Fax: [phone]   HPI:   Chief Complaint: OBESITY Hailey Noble is here to discuss her progress with her obesity treatment plan. She is on the keep a food journal with 1200-1300 calories and 85 grams of protein daily and is following her eating plan approximately 50 % of the time. She states she is exercising 0 minutes 0 times per week. Hailey Noble has been sick with an upper respiratory infection and has not been keeping track of her eating. She would like to get back on a structured meal plan to help her refocus on her weight loss goals.  Her weight is 206 lb (93.4 kg) today and has had a weight loss of 2 pounds over a period of 4 weeks since her last visit. She has lost 6 lbs since starting treatment with [name].  Vitamin D deficiency Hailey Noble has a diagnosis of vitamin D deficiency. She is currently taking prescription Vit D, level at 36 but not yet at goal. She denies nausea, vomiting or muscle weakness.  At risk for osteopenia and osteoporosis Hailey Noble is at higher risk of osteopenia and osteoporosis due to vitamin D deficiency.   Insulin Resistance Hailey Noble has a diagnosis of insulin resistance based on her elevated fasting insulin level >5. Although Hailey Noble's blood glucose readings are still under good control, insulin resistance puts her at greater risk of metabolic syndrome and diabetes. She is not taking metformin currently and continues to work on diet and exercise to decrease risk of diabetes. She denies polyphagia.  ALLERGIES: Allergies  Allergen Reactions  . Coconut Fatty Acids Hives  . Codeine   . Hydrocod Polst-Cpm Polst Er     REACTION: rash on face and head, itching  . Mango Flavor Hives  . [name] Other (See Comments)  . Oxycodone-Acetaminophen     REACTION: rash on face and head, itching  . Tussin Cough [Dextromethorphan Hbr]     Broken out in hives  . Ultram [Tramadol] Other (See Comments)    Dizzy and low blood pressure.     MEDICATIONS: Current  Outpatient Medications on File Prior to Visit  Medication Sig Dispense Refill  . acetaminophen (TYLENOL) 650 MG CR tablet Take 650 mg by mouth every 8 (eight) hours as needed. pain    . Calcium Carbonate-Vit D-Min (CALCIUM 1200 PO) Take by mouth daily.      . cetirizine (ZYRTEC) 10 MG tablet Take 10 mg by mouth daily.    . Cholecalciferol (VITAMIN D-1000 MAX ST PO) Take 1 tablet by mouth.    . escitalopram (LEXAPRO) 10 MG tablet Take 1 tablet (10 mg total) by mouth daily. 90 tablet 3  . famotidine (PEPCID) 20 MG tablet Take 20 mg daily as needed by mouth.     Marland Kitchen KRILL OIL 1000 MG CAPS Take 1 capsule by mouth daily.    . mometasone (NASONEX) 50 MCG/ACT nasal spray Place 2 sprays into the nose daily. 17 g 1  . Polyethyl Glycol-Propyl Glycol (SYSTANE FREE OP) Apply 1 drop to eye daily.     No current facility-administered medications on file prior to visit.     PAST MEDICAL HISTORY: Past Medical History:  Diagnosis Date  . Allergic state 12/08/2016  . Allergy   . Anxiety   . Back pain   . GERD (gastroesophageal reflux disease)   . Hyperlipidemia 06/09/2017  . Joint pain   . Knee pain, right 03/10/2017  . Plantar fasciitis   . Shortness of breath   . Sinusitis   .  Vitamin D deficiency 11/22/2016  . Vitamin D deficiency     PAST SURGICAL HISTORY: Past Surgical History:  Procedure Laterality Date  . COLONOSCOPY  02-2009   with Henrene Pastor normal  . ESOPHAGOGASTRODUODENOSCOPY  2012  . GANGLION CYST EXCISION Right    right- wrist   . NASAL SEPTUM SURGERY    . NASAL SINUS SURGERY    . TMJ ARTHROSCOPY     left    SOCIAL HISTORY: Social History   Tobacco Use  . Smoking status: Never Smoker  . Smokeless tobacco: Never Used  Substance Use Topics  . Alcohol use: Yes    Alcohol/week: 0.0 oz    Comment: rare  . Drug use: No    FAMILY HISTORY: Family History  Problem Relation Age of Onset  . Hypertension Father   . Diabetes Father   . Hyperlipidemia Father   . Breast cancer  Mother 103  . Cancer Mother        breast  . Thyroid disease Mother   . Sleep apnea Mother   . Colon cancer Paternal Grandfather        died at age 45  . Cancer Paternal Grandfather        GI CA  . Obesity Sister   . Diabetes Sister   . Hypertension Sister   . Heart disease Maternal Grandmother        MI  . Cancer Maternal Grandfather        lung, smoker  . Diabetes Maternal Grandfather   . Diabetes Paternal Grandmother   . Heart disease Paternal Grandmother   . Pancreatitis Sister   . Esophageal cancer Neg Hx   . Stomach cancer Neg Hx   . Stroke Neg Hx   . Colon polyps Neg Hx   . Rectal cancer Neg Hx   . Pancreatic cancer Neg Hx     ROS: Review of Systems  Constitutional: Positive for weight loss.  Gastrointestinal: Negative for nausea and vomiting.  Musculoskeletal:       Negative muscle weakness  Endo/Heme/Allergies:       Negative polyphagia    PHYSICAL EXAM: Blood pressure 125/78, pulse 78, temperature 97.8 F (36.6 C), temperature source Oral, height 5\' 5"  (1.651 m), weight 206 lb (93.4 kg), SpO2 97 %. Body mass index is 34.28 kg/m. Physical Exam  Constitutional: She is oriented to person, place, and time. She appears well-developed and well-nourished.  Cardiovascular: Normal rate.  Pulmonary/Chest: Effort normal.  Musculoskeletal: Normal range of motion.  Neurological: She is oriented to person, place, and time.  Skin: Skin is warm and dry.  Psychiatric: She has a normal mood and affect. Her behavior is normal.  Vitals reviewed.   RECENT LABS AND TESTS: BMET    Component Value Date/Time   NA 141 10/06/2017 0850   K 4.1 10/06/2017 0850   CL 102 10/06/2017 0850   CO2 24 10/06/2017 0850   GLUCOSE 76 10/06/2017 0850   GLUCOSE 85 11/22/2016 1521   GLUCOSE 86 11/13/2009   BUN 10 10/06/2017 0850   CREATININE 0.67 10/06/2017 0850   CREATININE 0.67 11/22/2016 1521   CALCIUM 9.0 10/06/2017 0850   GFRNONAA 101 10/06/2017 0850   GFRAA 117 10/06/2017  0850   Lab Results  Component Value Date   HGBA1C 5.2 10/06/2017   HGBA1C 5.1 04/06/2014   Lab Results  Component Value Date   INSULIN 8.8 10/06/2017   CBC    Component Value Date/Time   WBC 9.6 10/06/2017 0850  WBC 9.8 11/22/2016 1521   RBC 4.71 10/06/2017 0850   RBC 4.41 11/22/2016 1521   HGB 13.8 10/06/2017 0850   HCT 42.4 10/06/2017 0850   PLT 328 11/22/2016 1521   PLT 381 11/13/2009   MCV 90 10/06/2017 0850   MCH 29.3 10/06/2017 0850   MCH 29.7 11/22/2016 1521   MCHC 32.5 10/06/2017 0850   MCHC 32.2 11/22/2016 1521   RDW 13.5 10/06/2017 0850   LYMPHSABS 1.8 10/06/2017 0850   MONOABS 0.5 05/26/2015 0931   EOSABS 0.1 10/06/2017 0850   BASOSABS 0.1 10/06/2017 0850   Iron/TIBC/Ferritin/ %Sat    Component Value Date/Time   FERRITIN 50 11/09/2008   Lipid Panel     Component Value Date/Time   CHOL 140 10/06/2017 0850   TRIG 112 10/06/2017 0850   TRIG 138 11/13/2009 1337   HDL 48 10/06/2017 0850   CHOLHDL 3.9 11/22/2016 1521   VLDL 32 (H) 11/22/2016 1521   LDLCALC 70 10/06/2017 0850   Hepatic Function Panel     Component Value Date/Time   PROT 6.9 10/06/2017 0850   ALBUMIN 4.4 10/06/2017 0850   AST 16 10/06/2017 0850   ALT 17 10/06/2017 0850   ALKPHOS 116 10/06/2017 0850   BILITOT 0.4 10/06/2017 0850   BILIDIR 0.1 05/26/2015 0931      Component Value Date/Time   TSH 1.690 10/06/2017 0850   TSH 1.30 11/22/2016 1521   TSH 1.67 05/26/2015 0931  Results for Stemler, Eric I (MRN [id number]) as of 12/29/2017 10:21  Ref. Range 10/06/2017 08:50  Vitamin D, 25-Hydroxy Latest Ref Range: 30.0 - 100.0 ng/mL 36.9    ASSESSMENT AND PLAN: Vitamin D deficiency - Plan: Vitamin D, Ergocalciferol, (DRISDOL) 50000 units CAPS capsule  Insulin resistance  At risk for osteoporosis  Class 1 obesity with serious comorbidity and body mass index (BMI) of 34.0 to 34.9 in adult, unspecified obesity type  PLAN:  Vitamin D Deficiency Hailey Noble was informed that low vitamin D  levels contributes to fatigue and are associated with obesity, breast, and colon cancer. Hailey Noble agrees to continue taking prescription Vit D @50 ,000 IU every week #4 and we will refill for 1 month. She will follow up for routine testing of vitamin D, at least 2-3 times per year. She was informed of the risk of over-replacement of vitamin D and agrees to not increase her dose unless he discusses this with [name] first. Hailey Noble agrees to follow up with our clinic in 2 weeks.  At risk for osteopenia and osteoporosis Hailey Noble is at risk for osteopenia and osteoporsis due to her vitamin D deficiency. She was encouraged to take her vitamin D and follow her higher calcium diet and increase strengthening exercise to help strengthen her bones and decrease her risk of osteopenia and osteoporosis.  Insulin Resistance Hailey Noble will continue to work on diet, weight loss, exercise, and decreasing simple carbohydrates in her diet to help decrease the risk of diabetes. We dicussed metformin including benefits and risks. She was informed that eating too many simple carbohydrates or too many calories at one sitting increases the likelihood of GI side effects. Hailey Noble declined metformin for now and prescription was not written today. Hailey Noble agrees to follow up with our clinic in 2 weeks as directed to monitor her progress.   Obesity Hailey Noble is currently in the action stage of change. As such, her goal is to continue with weight loss efforts She has agreed to follow the Category 2 plan Hailey Noble has been instructed to work up  to a goal of 150 minutes of combined cardio and strengthening exercise per week for weight loss and overall health benefits. We discussed the following Behavioral Modification Strategies today: increasing lean protein intake, work on meal planning and easy cooking plans, and planning for success   Janaysia has agreed to follow up with our clinic in 2 weeks. She was informed of the importance of frequent follow up  visits to maximize her success with intensive lifestyle modifications for her multiple health conditions.      OBESITY BEHAVIORAL INTERVENTION VISIT  Today's visit was # 5 out of 22.  Starting weight: 212 lbs Starting date: 10/06/17 Today's weight : 206 lbs  Today's date: 12/29/2017 Total lbs lost to date: 6 (Patients must lose 7 lbs in the first 6 months to continue with counseling)   ASK: We discussed the diagnosis of obesity with Hailey Noble today and Hailey Noble agreed to give [name] permission to discuss obesity behavioral modification therapy today.  ASSESS: Hailey Noble has the diagnosis of obesity and her BMI today is 34.28 Hailey Noble is in the action stage of change   ADVISE: Caytlin was educated on the multiple health risks of obesity as well as the benefit of weight loss to improve her health. She was advised of the need for long term treatment and the importance of lifestyle modifications.  AGREE: Multiple dietary modification options and treatment options were discussed and  Anhar agreed to follow the Category 2 plan We discussed the following Behavioral Modification Strategies today: increasing lean protein intake, work on meal planning and easy cooking plans, and planning for success    I, Trixie Dredge, am acting as transcriptionist for [employer], PA-C  I, [facility], have reviewed this note and agree with the above  I have reviewed the above documentation for accuracy and completeness, and I agree with the above. -Hailey Nip, MD

## 2017-12-30 ENCOUNTER — Telehealth: Payer: 59 | Admitting: Family

## 2017-12-30 DIAGNOSIS — R05 Cough: Secondary | ICD-10-CM

## 2017-12-30 DIAGNOSIS — R059 Cough, unspecified: Secondary | ICD-10-CM

## 2017-12-30 MED ORDER — PREDNISONE 10 MG (21) PO TBPK: ORAL_TABLET | ORAL | 0 refills | Status: DC

## 2017-12-30 MED FILL — predniSONE 10 MG (21) TBPK: 10 | 6 days supply | Qty: 21 | Fill #0

## 2017-12-30 NOTE — Progress Notes (Signed)
We are sorry that you are not feeling well.  Here is how we plan to help!  Based on what you have shared with me it looks like you have sinusitis.  Sinusitis is inflammation and infection in the sinus cavities of the head.  Based on your presentation I believe you most likely have Acute Viral Sinusitis.This is an infection most likely caused by a virus. There is not specific treatment for viral sinusitis other than to help you with the symptoms until the infection runs its course.  You may use an oral decongestant such as Mucinex D or if you have glaucoma or high blood pressure use plain Mucinex. Saline nasal spray help and can safely be used as often as needed for congestion, I have prescribed: Sterapred  And sent to the pharmacy  Some authorities believe that zinc sprays or the use of Echinacea may shorten the course of your symptoms.  Sinus infections are not as easily transmitted as other respiratory infection, however we still recommend that you avoid close contact with loved ones, especially the very young and elderly.  Remember to wash your hands thoroughly throughout the day as this is the number one way to prevent the spread of infection!  Home Care:  Only take medications as instructed by your medical team.  Complete the entire course of an antibiotic.  Do not take these medications with alcohol.  A steam or ultrasonic humidifier can help congestion.  You can place a towel over your head and breathe in the steam from hot water coming from a faucet.  Avoid close contacts especially the very young and the elderly.  Cover your mouth when you cough or sneeze.  Always remember to wash your hands.  Get Help Right Away If:  You develop worsening fever or sinus pain.  You develop a severe head ache or visual changes.  Your symptoms persist after you have completed your treatment plan.  Make sure you  Understand these instructions.  Will watch your condition.  Will get help  right away if you are not doing well or get worse.  Your e-visit answers were reviewed by a board certified advanced clinical practitioner to complete your personal care plan.  Depending on the condition, your plan could have included both over the counter or prescription medications.  If there is a problem please reply  once you have received a response from your provider.  Your safety is important to [name].  If you have drug allergies check your prescription carefully.    You can use MyChart to ask questions about today's visit, request a non-urgent call back, or ask for a work or school excuse for 24 hours related to this e-Visit. If it has been greater than 24 hours you will need to follow up with your provider, or enter a new e-Visit to address those concerns.  You will get an e-mail in the next two days asking about your experience.  I hope that your e-visit has been valuable and will speed your recovery. Thank you for using e-visits.

## 2018-01-01 ENCOUNTER — Encounter: Payer: Self-pay | Admitting: Family Medicine

## 2018-01-12 ENCOUNTER — Ambulatory Visit (INDEPENDENT_AMBULATORY_CARE_PROVIDER_SITE_OTHER): Payer: 59 | Admitting: Physician Assistant

## 2018-01-12 VITALS — BP 135/76 | HR 81 | Ht 65.0 in | Wt 210.0 lb

## 2018-01-12 DIAGNOSIS — E559 Vitamin D deficiency, unspecified: Secondary | ICD-10-CM | POA: Diagnosis not present

## 2018-01-12 DIAGNOSIS — Z9189 Other specified personal risk factors, not elsewhere classified: Secondary | ICD-10-CM

## 2018-01-12 DIAGNOSIS — E8881 Metabolic syndrome: Secondary | ICD-10-CM

## 2018-01-12 DIAGNOSIS — Z6835 Body mass index (BMI) 35.0-35.9, adult: Secondary | ICD-10-CM | POA: Diagnosis not present

## 2018-01-12 MED ORDER — VITAMIN D (ERGOCALCIFEROL) 1.25 MG (50000 UNIT) PO CAPS: 50000.0000 [IU] | ORAL_CAPSULE | ORAL | 0 refills | Status: DC

## 2018-01-12 NOTE — Progress Notes (Addendum)
Office: [phone]  /  Fax: [phone]   HPI:   Chief Complaint: OBESITY Hailey Noble is here to discuss her progress with her obesity treatment plan. She is on the Category 2 plan and is following her eating plan approximately 75 % of the time. She states she is exercising 0 minutes 0 times per week. Hailey Noble has been sick with a upper respiratory infection and has not been following the meal plan. She would like more variety with her dinner. Her weight is 210 lb (95.3 kg) today and has had a weight gain of 4 pounds over a period of 2 weeks since her last visit. She has lost 2 lbs since starting treatment with [name].  Vitamin D deficiency Hailey Noble has a diagnosis of vitamin D deficiency. She is currently taking vit D and denies nausea, vomiting or muscle weakness.   Ref. Range 10/06/2017 08:50  Vitamin D, 25-Hydroxy Latest Ref Range: 30.0 - 100.0 ng/mL 36.9    At risk for osteopenia and osteoporosis Hailey Noble is at higher risk of osteopenia and osteoporosis due to vitamin D deficiency.   Insulin Resistance Hailey Noble has a diagnosis of insulin resistance based on her elevated fasting insulin level >5. Although Hailey Noble's blood glucose readings are still under good control, insulin resistance puts her at greater risk of metabolic syndrome and diabetes. She is not taking metformin currently and continues to work on diet and exercise to decrease risk of diabetes. She denies polyphagia.  ALLERGIES: Allergies  Allergen Reactions  . Coconut Fatty Acids Hives  . Codeine   . Hydrocod Polst-Cpm Polst Er     REACTION: rash on face and head, itching  . Mango Flavor Hives  . [name] Other (See Comments)  . Oxycodone-Acetaminophen     REACTION: rash on face and head, itching  . Tussin Cough [Dextromethorphan Hbr]     Broken out in hives  . Ultram [Tramadol] Other (See Comments)    Dizzy and low blood pressure.     MEDICATIONS: Current Outpatient Medications on File Prior to Visit  Medication Sig  Dispense Refill  . acetaminophen (TYLENOL) 650 MG CR tablet Take 650 mg by mouth every 8 (eight) hours as needed. pain    . Calcium Carbonate-Vit D-Min (CALCIUM 1200 PO) Take by mouth daily.      . cetirizine (ZYRTEC) 10 MG tablet Take 10 mg by mouth daily.    . Cholecalciferol (VITAMIN D-1000 MAX ST PO) Take 1 tablet by mouth.    . escitalopram (LEXAPRO) 10 MG tablet Take 1 tablet (10 mg total) by mouth daily. 90 tablet 3  . famotidine (PEPCID) 20 MG tablet Take 20 mg daily as needed by mouth.     Marland Kitchen KRILL OIL 1000 MG CAPS Take 1 capsule by mouth daily.    . mometasone (NASONEX) 50 MCG/ACT nasal spray Place 2 sprays into the nose daily. 17 g 1  . Polyethyl Glycol-Propyl Glycol (SYSTANE FREE OP) Apply 1 drop to eye daily.     No current facility-administered medications on file prior to visit.     PAST MEDICAL HISTORY: Past Medical History:  Diagnosis Date  . Allergic state 12/08/2016  . Allergy   . Anxiety   . Back pain   . GERD (gastroesophageal reflux disease)   . Hyperlipidemia 06/09/2017  . Joint pain   . Knee pain, right 03/10/2017  . Plantar fasciitis   . Shortness of breath   . Sinusitis   . Vitamin D deficiency 11/22/2016  . Vitamin D deficiency  PAST SURGICAL HISTORY: Past Surgical History:  Procedure Laterality Date  . COLONOSCOPY  02-2009   with Henrene Pastor normal  . ESOPHAGOGASTRODUODENOSCOPY  2012  . GANGLION CYST EXCISION Right    right- wrist   . NASAL SEPTUM SURGERY    . NASAL SINUS SURGERY    . TMJ ARTHROSCOPY     left    SOCIAL HISTORY: Social History   Tobacco Use  . Smoking status: Never Smoker  . Smokeless tobacco: Never Used  Substance Use Topics  . Alcohol use: Yes    Alcohol/week: 0.0 oz    Comment: rare  . Drug use: No    FAMILY HISTORY: Family History  Problem Relation Age of Onset  . Hypertension Father   . Diabetes Father   . Hyperlipidemia Father   . Breast cancer Mother 54  . Cancer Mother        breast  . Thyroid disease  Mother   . Sleep apnea Mother   . Colon cancer Paternal Grandfather        died at age 10  . Cancer Paternal Grandfather        GI CA  . Obesity Sister   . Diabetes Sister   . Hypertension Sister   . Heart disease Maternal Grandmother        MI  . Cancer Maternal Grandfather        lung, smoker  . Diabetes Maternal Grandfather   . Diabetes Paternal Grandmother   . Heart disease Paternal Grandmother   . Pancreatitis Sister   . Esophageal cancer Neg Hx   . Stomach cancer Neg Hx   . Stroke Neg Hx   . Colon polyps Neg Hx   . Rectal cancer Neg Hx   . Pancreatic cancer Neg Hx     ROS: Review of Systems  Constitutional: Negative for weight loss.  Gastrointestinal: Negative for nausea and vomiting.  Musculoskeletal:       Negative muscle weakness  Endo/Heme/Allergies:       Negative polyphagia    PHYSICAL EXAM: Blood pressure 135/76, pulse 81, height 5\' 5"  (1.651 m), weight 210 lb (95.3 kg), SpO2 96 %. Body mass index is 34.95 kg/m. Physical Exam  Constitutional: She is oriented to person, place, and time. She appears well-developed and well-nourished.  Cardiovascular: Normal rate.  Pulmonary/Chest: Effort normal.  Musculoskeletal: Normal range of motion.  Neurological: She is oriented to person, place, and time.  Skin: Skin is warm and dry.  Psychiatric: She has a normal mood and affect. Her behavior is normal.  Vitals reviewed.   RECENT LABS AND TESTS: BMET    Component Value Date/Time   NA 141 10/06/2017 0850   K 4.1 10/06/2017 0850   CL 102 10/06/2017 0850   CO2 24 10/06/2017 0850   GLUCOSE 76 10/06/2017 0850   GLUCOSE 85 11/22/2016 1521   GLUCOSE 86 11/13/2009   BUN 10 10/06/2017 0850   CREATININE 0.67 10/06/2017 0850   CREATININE 0.67 11/22/2016 1521   CALCIUM 9.0 10/06/2017 0850   GFRNONAA 101 10/06/2017 0850   GFRAA 117 10/06/2017 0850   Lab Results  Component Value Date   HGBA1C 5.2 10/06/2017   HGBA1C 5.1 04/06/2014   Lab Results  Component  Value Date   INSULIN 8.8 10/06/2017   CBC    Component Value Date/Time   WBC 9.6 10/06/2017 0850   WBC 9.8 11/22/2016 1521   RBC 4.71 10/06/2017 0850   RBC 4.41 11/22/2016 1521   HGB 13.8 10/06/2017  0850   HCT 42.4 10/06/2017 0850   PLT 328 11/22/2016 1521   PLT 381 11/13/2009   MCV 90 10/06/2017 0850   MCH 29.3 10/06/2017 0850   MCH 29.7 11/22/2016 1521   MCHC 32.5 10/06/2017 0850   MCHC 32.2 11/22/2016 1521   RDW 13.5 10/06/2017 0850   LYMPHSABS 1.8 10/06/2017 0850   MONOABS 0.5 05/26/2015 0931   EOSABS 0.1 10/06/2017 0850   BASOSABS 0.1 10/06/2017 0850   Iron/TIBC/Ferritin/ %Sat    Component Value Date/Time   FERRITIN 50 11/09/2008   Lipid Panel     Component Value Date/Time   CHOL 140 10/06/2017 0850   TRIG 112 10/06/2017 0850   TRIG 138 11/13/2009 1337   HDL 48 10/06/2017 0850   CHOLHDL 3.9 11/22/2016 1521   VLDL 32 (H) 11/22/2016 1521   LDLCALC 70 10/06/2017 0850   Hepatic Function Panel     Component Value Date/Time   PROT 6.9 10/06/2017 0850   ALBUMIN 4.4 10/06/2017 0850   AST 16 10/06/2017 0850   ALT 17 10/06/2017 0850   ALKPHOS 116 10/06/2017 0850   BILITOT 0.4 10/06/2017 0850   BILIDIR 0.1 05/26/2015 0931      Component Value Date/Time   TSH 1.690 10/06/2017 0850   TSH 1.30 11/22/2016 1521   TSH 1.67 05/26/2015 0931     Ref. Range 10/06/2017 08:50  Vitamin D, 25-Hydroxy Latest Ref Range: 30.0 - 100.0 ng/mL 36.9    ASSESSMENT AND PLAN: Vitamin D deficiency - Plan: Vitamin D, Ergocalciferol, (DRISDOL) 50000 units CAPS capsule  Insulin resistance  At risk for osteoporosis  Class 2 severe obesity with serious comorbidity and body mass index (BMI) of 35.0 to 35.9 in adult, unspecified obesity type ([place])  PLAN:  Vitamin D Deficiency Rim was informed that low vitamin D levels contributes to fatigue and are associated with obesity, breast, and colon cancer. She agrees to continue to take prescription Vit D @50 ,000 IU every week #4 with  no refills and will follow up for routine testing of vitamin D, at least 2-3 times per year. She was informed of the risk of over-replacement of vitamin D and agrees to not increase her dose unless she discusses this with [name] first. Cindi agrees to follow up with our clinic in 2 weeks.  At risk for osteopenia and osteoporosis Ethlyn is at risk for osteopenia and osteoporosis due to her vitamin D deficiency. She was encouraged to take her vitamin D and follow her higher calcium diet and increase strengthening exercise to help strengthen her bones and decrease her risk of osteopenia and osteoporosis.  Insulin Resistance Naeema will continue to work on weight loss, exercise, and decreasing simple carbohydrates in her diet to help decrease the risk of diabetes. She was informed that eating too many simple carbohydrates or too many calories at one sitting increases the likelihood of GI side effects. Brandan agreed to follow up with [name] as directed to monitor her progress.  Obesity Dorothy is currently in the action stage of change. As such, her goal is to continue with weight loss efforts She has agreed to keep a food journal with 500 calories and 40 grams of protein at supper daily and follow the Category 2 plan Demetri has been instructed to work up to a goal of 150 minutes of combined cardio and strengthening exercise per week for weight loss and overall health benefits. We discussed the following Behavioral Modification Strategies today: increasing lean protein intake and planning for success  Erline Levine  has agreed to follow up with our clinic in 2 weeks. She was informed of the importance of frequent follow up visits to maximize her success with intensive lifestyle modifications for her multiple health conditions.    OBESITY BEHAVIORAL INTERVENTION VISIT  Today's visit was # 6 out of 22.  Starting weight: 212 lbs Starting date: 10/06/17 Today's weight : 210 lbs Today's date: 01/12/2018 Total lbs  lost to date: 2 (Patients must lose 7 lbs in the first 6 months to continue with counseling)   ASK: We discussed the diagnosis of obesity with Hebert Soho today and Nykia agreed to give [name] permission to discuss obesity behavioral modification therapy today.  ASSESS: Tiffeny has the diagnosis of obesity and her BMI today is 34.95 Georgeann is in the action stage of change   ADVISE: Daleysa was educated on the multiple health risks of obesity as well as the benefit of weight loss to improve her health. She was advised of the need for long term treatment and the importance of lifestyle modifications.  AGREE: Multiple dietary modification options and treatment options were discussed and  Marietta agreed to the above obesity treatment plan.   Corey Skains, am acting as transcriptionist for [employer], PA-C I, Lacy Duverney [facility], have reviewed this note and agree with its contents.   I have reviewed the above documentation for accuracy and completeness, and I agree with the above. -Dennard Nip, MD

## 2018-01-27 ENCOUNTER — Ambulatory Visit (INDEPENDENT_AMBULATORY_CARE_PROVIDER_SITE_OTHER): Payer: 59 | Admitting: Physician Assistant

## 2018-01-27 VITALS — BP 114/73 | HR 86 | Temp 97.8°F | Ht 65.0 in | Wt 212.0 lb

## 2018-01-27 DIAGNOSIS — Z9189 Other specified personal risk factors, not elsewhere classified: Secondary | ICD-10-CM

## 2018-01-27 DIAGNOSIS — E88819 Insulin resistance, unspecified: Secondary | ICD-10-CM

## 2018-01-27 DIAGNOSIS — E559 Vitamin D deficiency, unspecified: Secondary | ICD-10-CM | POA: Diagnosis not present

## 2018-01-27 DIAGNOSIS — E66812 Obesity, class 2: Secondary | ICD-10-CM

## 2018-01-27 DIAGNOSIS — E8881 Metabolic syndrome: Secondary | ICD-10-CM | POA: Diagnosis not present

## 2018-01-27 DIAGNOSIS — Z6835 Body mass index (BMI) 35.0-35.9, adult: Secondary | ICD-10-CM

## 2018-01-27 MED ORDER — VITAMIN D (ERGOCALCIFEROL) 1.25 MG (50000 UNIT) PO CAPS: 50000.0000 [IU] | ORAL_CAPSULE | ORAL | 0 refills | Status: DC

## 2018-01-27 NOTE — Progress Notes (Signed)
Office: [phone]  /  Fax: [phone]   HPI:   Chief Complaint: OBESITY Hailey Noble is here to discuss her progress with her obesity treatment plan. She is on the keep a food journal with 500 calories and 40 grams of protein at supper daily and the Category 2 plan and is following her eating plan approximately 90 % of the time. She states she is walking 60 minutes 5 to 7 times per week. Hailey Noble has been mindful of her eating and she controls her portions. She has been incorporating more variety with her meals, but she does not journal all her meals. Her weight is 212 lb (96.2 kg) today and has had a weight gain of 2 pounds over a period of 2 weeks since her last visit. She has lost 0 lbs since starting treatment with [name].  Vitamin D deficiency Hailey Noble has a diagnosis of vitamin D deficiency. She is currently taking vit D and denies nausea, vomiting or muscle weakness.   Ref. Range 10/06/2017 08:50  Vitamin D, 25-Hydroxy Latest Ref Range: 30.0 - 100.0 ng/mL 36.9   Insulin Resistance Hailey Noble has a diagnosis of insulin resistance based on her elevated fasting insulin level >5. Although Hailey Noble's blood glucose readings are still under good control, insulin resistance puts her at greater risk of metabolic syndrome and diabetes. She is not taking metformin currently and notes polyphagia in the evening. Hailey Noble continues to work on diet and exercise to decrease risk of diabetes.  At risk for diabetes Hailey Noble is at higher than average risk for developing diabetes due to her obesity and insulin resistance. She currently denies polyuria or polydipsia.  ALLERGIES: Allergies  Allergen Reactions  . Coconut Fatty Acids Hives  . Codeine   . Hydrocod Polst-Cpm Polst Er     REACTION: rash on face and head, itching  . Mango Flavor Hives  . [name] Other (See Comments)  . Oxycodone-Acetaminophen     REACTION: rash on face and head, itching  . Tussin Cough [Dextromethorphan Hbr]     Broken out in hives   . Ultram [Tramadol] Other (See Comments)    Dizzy and low blood pressure.     MEDICATIONS: Current Outpatient Medications on File Prior to Visit  Medication Sig Dispense Refill  . acetaminophen (TYLENOL) 650 MG CR tablet Take 650 mg by mouth every 8 (eight) hours as needed. pain    . Calcium Carbonate-Vit D-Min (CALCIUM 1200 PO) Take by mouth daily.      . cetirizine (ZYRTEC) 10 MG tablet Take 10 mg by mouth daily.    . Cholecalciferol (VITAMIN D-1000 MAX ST PO) Take 1 tablet by mouth.    . escitalopram (LEXAPRO) 10 MG tablet Take 1 tablet (10 mg total) by mouth daily. 90 tablet 3  . famotidine (PEPCID) 20 MG tablet Take 20 mg daily as needed by mouth.     Marland Kitchen KRILL OIL 1000 MG CAPS Take 1 capsule by mouth daily.    . mometasone (NASONEX) 50 MCG/ACT nasal spray Place 2 sprays into the nose daily. 17 g 1  . Polyethyl Glycol-Propyl Glycol (SYSTANE FREE OP) Apply 1 drop to eye daily.    . Vitamin D, Ergocalciferol, (DRISDOL) 50000 units CAPS capsule Take 1 capsule (50,000 Units total) by mouth every 7 (seven) days. 4 capsule 0   No current facility-administered medications on file prior to visit.     PAST MEDICAL HISTORY: Past Medical History:  Diagnosis Date  . Allergic state 12/08/2016  . Allergy   .  Anxiety   . Back pain   . GERD (gastroesophageal reflux disease)   . Hyperlipidemia 06/09/2017  . Joint pain   . Knee pain, right 03/10/2017  . Plantar fasciitis   . Shortness of breath   . Sinusitis   . Vitamin D deficiency 11/22/2016  . Vitamin D deficiency     PAST SURGICAL HISTORY: Past Surgical History:  Procedure Laterality Date  . COLONOSCOPY  02-2009   with Henrene Pastor normal  . ESOPHAGOGASTRODUODENOSCOPY  2012  . GANGLION CYST EXCISION Right    right- wrist   . NASAL SEPTUM SURGERY    . NASAL SINUS SURGERY    . TMJ ARTHROSCOPY     left    SOCIAL HISTORY: Social History   Tobacco Use  . Smoking status: Never Smoker  . Smokeless tobacco: Never Used  Substance Use  Topics  . Alcohol use: Yes    Alcohol/week: 0.0 oz    Comment: rare  . Drug use: No    FAMILY HISTORY: Family History  Problem Relation Age of Onset  . Hypertension Father   . Diabetes Father   . Hyperlipidemia Father   . Breast cancer Mother 19  . Cancer Mother        breast  . Thyroid disease Mother   . Sleep apnea Mother   . Colon cancer Paternal Grandfather        died at age 39  . Cancer Paternal Grandfather        GI CA  . Obesity Sister   . Diabetes Sister   . Hypertension Sister   . Heart disease Maternal Grandmother        MI  . Cancer Maternal Grandfather        lung, smoker  . Diabetes Maternal Grandfather   . Diabetes Paternal Grandmother   . Heart disease Paternal Grandmother   . Pancreatitis Sister   . Esophageal cancer Neg Hx   . Stomach cancer Neg Hx   . Stroke Neg Hx   . Colon polyps Neg Hx   . Rectal cancer Neg Hx   . Pancreatic cancer Neg Hx     ROS: Review of Systems  Constitutional: Negative for weight loss.  Gastrointestinal: Negative for nausea and vomiting.  Genitourinary: Negative for frequency.  Musculoskeletal:       Negative muscle weakness  Endo/Heme/Allergies: Negative for polydipsia.       Positive polyphagia    PHYSICAL EXAM: Blood pressure 114/73, pulse 86, temperature 97.8 F (36.6 C), temperature source Oral, height 5\' 5"  (1.651 m), weight 212 lb (96.2 kg), SpO2 95 %. Body mass index is 35.28 kg/m. Physical Exam  Constitutional: She is oriented to person, place, and time. She appears well-developed and well-nourished.  Cardiovascular: Normal rate.  Pulmonary/Chest: Effort normal.  Musculoskeletal: Normal range of motion.  Neurological: She is oriented to person, place, and time.  Skin: Skin is warm and dry.  Psychiatric: She has a normal mood and affect.  Vitals reviewed.   RECENT LABS AND TESTS: BMET    Component Value Date/Time   NA 141 10/06/2017 0850   K 4.1 10/06/2017 0850   CL 102 10/06/2017 0850   CO2  24 10/06/2017 0850   GLUCOSE 76 10/06/2017 0850   GLUCOSE 85 11/22/2016 1521   GLUCOSE 86 11/13/2009   BUN 10 10/06/2017 0850   CREATININE 0.67 10/06/2017 0850   CREATININE 0.67 11/22/2016 1521   CALCIUM 9.0 10/06/2017 [facility] 10/06/2017 0850   GFRAA 117  10/06/2017 0850   Lab Results  Component Value Date   HGBA1C 5.2 10/06/2017   HGBA1C 5.1 04/06/2014   Lab Results  Component Value Date   INSULIN 8.8 10/06/2017   CBC    Component Value Date/Time   WBC 9.6 10/06/2017 0850   WBC 9.8 11/22/2016 1521   RBC 4.71 10/06/2017 0850   RBC 4.41 11/22/2016 1521   HGB 13.8 10/06/2017 0850   HCT 42.4 10/06/2017 0850   PLT 328 11/22/2016 1521   PLT 381 11/13/2009   MCV 90 10/06/2017 0850   MCH 29.3 10/06/2017 0850   MCH 29.7 11/22/2016 1521   MCHC 32.5 10/06/2017 0850   MCHC 32.2 11/22/2016 1521   RDW 13.5 10/06/2017 0850   LYMPHSABS 1.8 10/06/2017 0850   MONOABS 0.5 05/26/2015 0931   EOSABS 0.1 10/06/2017 0850   BASOSABS 0.1 10/06/2017 0850   Iron/TIBC/Ferritin/ %Sat    Component Value Date/Time   FERRITIN 50 11/09/2008   Lipid Panel     Component Value Date/Time   CHOL 140 10/06/2017 0850   TRIG 112 10/06/2017 0850   TRIG 138 11/13/2009 1337   HDL 48 10/06/2017 0850   CHOLHDL 3.9 11/22/2016 1521   VLDL 32 (H) 11/22/2016 1521   LDLCALC 70 10/06/2017 0850   Hepatic Function Panel     Component Value Date/Time   PROT 6.9 10/06/2017 0850   ALBUMIN 4.4 10/06/2017 0850   AST 16 10/06/2017 0850   ALT 17 10/06/2017 0850   ALKPHOS 116 10/06/2017 0850   BILITOT 0.4 10/06/2017 0850   BILIDIR 0.1 05/26/2015 0931      Component Value Date/Time   TSH 1.690 10/06/2017 0850   TSH 1.30 11/22/2016 1521   TSH 1.67 05/26/2015 0931     Ref. Range 10/06/2017 08:50  Vitamin D, 25-Hydroxy Latest Ref Range: 30.0 - 100.0 ng/mL 36.9   ASSESSMENT AND PLAN: Vitamin D deficiency - Plan: Vitamin D, Ergocalciferol, (DRISDOL) 50000 units CAPS capsule  Insulin  resistance  At risk for diabetes mellitus  Class 2 severe obesity with serious comorbidity and body mass index (BMI) of 35.0 to 35.9 in adult, unspecified obesity type (Hailey Noble)  PLAN:  Vitamin D Deficiency Hailey Noble was informed that low vitamin D levels contributes to fatigue and are associated with obesity, breast, and colon cancer. She agrees to continue to take prescription Vit D @50 ,000 IU every week #4 with no refills and will follow up for routine testing of vitamin D, at least 2-3 times per year. She was informed of the risk of over-replacement of vitamin D and agrees to not increase her dose unless she discusses this with [name] first. Hailey Noble agrees to follow up with our clinic in 2 weeks.  Insulin Resistance Hailey Noble will continue to work on weight loss, exercise, and decreasing simple carbohydrates in her diet to help decrease the risk of diabetes. We dicussed metformin including benefits and risks. She was informed that eating too many simple carbohydrates or too many calories at one sitting increases the likelihood of GI side effects. Clydene declined metformin for now and prescription was not written today. Hailey Noble agreed to follow up with [name] as directed to monitor her progress.  Diabetes risk counseling Hailey Noble was given extended (15 minutes) diabetes prevention counseling today. She is 52 y.o. female and has risk factors for diabetes including obesity and insulin resistance. We discussed intensive lifestyle modifications today with an emphasis on weight loss as well as increasing exercise and decreasing simple carbohydrates in her diet.  Obesity  Hailey Noble is currently in the action stage of change. As such, her goal is to continue with weight loss efforts She has agreed to keep a food journal with 500 calories and 40 grams of protein at supper daily and follow the Category 2 plan Hailey Noble has been instructed to work up to a goal of 150 minutes of combined cardio and strengthening exercise per week  for weight loss and overall health benefits. We discussed the following Behavioral Modification Strategies today: increasing lean protein intake and keep a strict food journal.  Hailey Noble has agreed to follow up with our clinic in 2 weeks. She was informed of the importance of frequent follow up visits to maximize her success with intensive lifestyle modifications for her multiple health conditions.    OBESITY BEHAVIORAL INTERVENTION VISIT  Today's visit was # 7 out of 22.  Starting weight: 212 lbs Starting date: 10/06/17 Today's weight : 212 lbs Today's date: 01/27/2018 Total lbs lost to date: 0 (Patients must lose 7 lbs in the first 6 months to continue with counseling)   ASK: We discussed the diagnosis of obesity with Hailey Noble today and Hailey Noble agreed to give [name] permission to discuss obesity behavioral modification therapy today.  ASSESS: Hailey Noble has the diagnosis of obesity and her BMI today is 35.28 Hailey Noble is in the action stage of change   ADVISE: Hailey Noble was educated on the multiple health risks of obesity as well as the benefit of weight loss to improve her health. She was advised of the need for long term treatment and the importance of lifestyle modifications.  AGREE: Multiple dietary modification options and treatment options were discussed and  Hailey Noble agreed to the above obesity treatment plan.   Corey Skains, am acting as transcriptionist for [employer], PA-C I, Lacy Duverney [facility], have reviewed this note and agree with its content.

## 2018-01-28 MED FILL — VIT D2 1.25 MG (50,000 UNIT: 1.25 MG | 28 days supply | Qty: 4 | Fill #0

## 2018-02-17 ENCOUNTER — Ambulatory Visit (INDEPENDENT_AMBULATORY_CARE_PROVIDER_SITE_OTHER): Payer: 59 | Admitting: Physician Assistant

## 2018-02-17 VITALS — BP 125/72 | HR 78 | Temp 98.1°F | Ht 65.0 in | Wt 214.0 lb

## 2018-02-17 DIAGNOSIS — Z9189 Other specified personal risk factors, not elsewhere classified: Secondary | ICD-10-CM

## 2018-02-17 DIAGNOSIS — Z6835 Body mass index (BMI) 35.0-35.9, adult: Secondary | ICD-10-CM

## 2018-02-17 DIAGNOSIS — E8881 Metabolic syndrome: Secondary | ICD-10-CM

## 2018-02-17 DIAGNOSIS — E559 Vitamin D deficiency, unspecified: Secondary | ICD-10-CM

## 2018-02-17 MED ORDER — VITAMIN D (ERGOCALCIFEROL) 1.25 MG (50000 UNIT) PO CAPS: 50000.0000 [IU] | ORAL_CAPSULE | ORAL | 0 refills | Status: DC

## 2018-02-18 NOTE — Progress Notes (Signed)
Office: [phone]  /  Fax: [phone]   HPI:   Chief Complaint: OBESITY Hailey Noble is here to discuss her progress with her obesity treatment plan. She is on the keep a food journal with 500 calories and 40 grams of protein at supper daily and the Category 2 plan and is following her eating plan approximately 80 % of the time. She states she is swimming and doing resistance training 30 minutes 3 times per week. [name] states she has been eating out more for her job, which made it harder to be mindful of her eating. Her weight is 214 lb (97.1 kg) today and has had a weight gain of 2 pounds over a period of 3 weeks since her last visit. She has gained 2 lbs since starting treatment with [name].  Vitamin D deficiency [name] has a diagnosis of vitamin D deficiency. She is currently taking vit D and denies nausea, vomiting or muscle weakness.   Ref. Range 10/06/2017 08:50  Vitamin D, 25-Hydroxy Latest Ref Range: 30.0 - 100.0 ng/mL 36.9   At risk for osteopenia and osteoporosis [name] is at higher risk of osteopenia and osteoporosis due to vitamin D deficiency.   Insulin Resistance [name] has a diagnosis of insulin resistance based on her elevated fasting insulin level >5. Although [name]'s blood glucose readings are still under good control, insulin resistance puts her at greater risk of metabolic syndrome and diabetes. She is not taking metformin currently and continues to work on diet and exercise to decrease risk of diabetes. She denies polyphagia.  ALLERGIES: Allergies  Allergen Reactions  . Coconut Fatty Acids Hives  . Codeine   . Hydrocod Polst-Cpm Polst Er     REACTION: rash on face and head, itching  . Mango Flavor Hives  . [name] Other (See Comments)  . Oxycodone-Acetaminophen     REACTION: rash on face and head, itching  . Tussin Cough [Dextromethorphan Hbr]     Broken out in hives  . Ultram [Tramadol] Other (See Comments)    Dizzy and low blood pressure.      MEDICATIONS: Current Outpatient Medications on File Prior to Visit  Medication Sig Dispense Refill  . acetaminophen (TYLENOL) 650 MG CR tablet Take 650 mg by mouth every 8 (eight) hours as needed. pain    . Calcium Carbonate-Vit D-Min (CALCIUM 1200 PO) Take by mouth daily.      . cetirizine (ZYRTEC) 10 MG tablet Take 10 mg by mouth daily.    . Cholecalciferol (VITAMIN D-1000 MAX ST PO) Take 1 tablet by mouth.    . escitalopram (LEXAPRO) 10 MG tablet Take 1 tablet (10 mg total) by mouth daily. 90 tablet 3  . famotidine (PEPCID) 20 MG tablet Take 20 mg daily as needed by mouth.     Marland Kitchen KRILL OIL 1000 MG CAPS Take 1 capsule by mouth daily.    . mometasone (NASONEX) 50 MCG/ACT nasal spray Place 2 sprays into the nose daily. 17 g 1  . Polyethyl Glycol-Propyl Glycol (SYSTANE FREE OP) Apply 1 drop to eye daily.     No current facility-administered medications on file prior to visit.     PAST MEDICAL HISTORY: Past Medical History:  Diagnosis Date  . Allergic state 12/08/2016  . Allergy   . Anxiety   . Back pain   . GERD (gastroesophageal reflux disease)   . Hyperlipidemia 06/09/2017  . Joint pain   . Knee pain, right 03/10/2017  . Plantar fasciitis   . Shortness of breath   .  Sinusitis   . Vitamin D deficiency 11/22/2016  . Vitamin D deficiency     PAST SURGICAL HISTORY: Past Surgical History:  Procedure Laterality Date  . COLONOSCOPY  02-2009   with Henrene Pastor normal  . ESOPHAGOGASTRODUODENOSCOPY  2012  . GANGLION CYST EXCISION Right    right- wrist   . NASAL SEPTUM SURGERY    . NASAL SINUS SURGERY    . TMJ ARTHROSCOPY     left    SOCIAL HISTORY: Social History   Tobacco Use  . Smoking status: Never Smoker  . Smokeless tobacco: Never Used  Substance Use Topics  . Alcohol use: Yes    Alcohol/week: 0.0 oz    Comment: rare  . Drug use: No    FAMILY HISTORY: Family History  Problem Relation Age of Onset  . Hypertension Father   . Diabetes Father   . Hyperlipidemia  Father   . Breast cancer Mother 69  . Cancer Mother        breast  . Thyroid disease Mother   . Sleep apnea Mother   . Colon cancer Paternal Grandfather        died at age 49  . Cancer Paternal Grandfather        GI CA  . Obesity Sister   . Diabetes Sister   . Hypertension Sister   . Heart disease Maternal Grandmother        MI  . Cancer Maternal Grandfather        lung, smoker  . Diabetes Maternal Grandfather   . Diabetes Paternal Grandmother   . Heart disease Paternal Grandmother   . Pancreatitis Sister   . Esophageal cancer Neg Hx   . Stomach cancer Neg Hx   . Stroke Neg Hx   . Colon polyps Neg Hx   . Rectal cancer Neg Hx   . Pancreatic cancer Neg Hx     ROS: Review of Systems  Constitutional: Negative for weight loss.  Gastrointestinal: Negative for nausea and vomiting.  Musculoskeletal:       Negative for polyphagia    PHYSICAL EXAM: Blood pressure 125/72, pulse 78, temperature 98.1 F (36.7 C), temperature source Oral, height 5\' 5"  (1.651 m), weight 214 lb (97.1 kg), SpO2 97 %. Body mass index is 35.61 kg/m. Physical Exam  Constitutional: She is oriented to person, place, and time. She appears well-developed and well-nourished.  Cardiovascular: Normal rate.  Pulmonary/Chest: Effort normal.  Musculoskeletal: Normal range of motion.  Neurological: She is oriented to person, place, and time.  Skin: Skin is warm and dry.  Psychiatric: She has a normal mood and affect. Her behavior is normal.  Vitals reviewed.   RECENT LABS AND TESTS: BMET    Component Value Date/Time   NA 141 10/06/2017 0850   K 4.1 10/06/2017 0850   CL 102 10/06/2017 0850   CO2 24 10/06/2017 0850   GLUCOSE 76 10/06/2017 0850   GLUCOSE 85 11/22/2016 1521   GLUCOSE 86 11/13/2009   BUN 10 10/06/2017 0850   CREATININE 0.67 10/06/2017 0850   CREATININE 0.67 11/22/2016 1521   CALCIUM 9.0 10/06/2017 0850   GFRNONAA 101 10/06/2017 0850   GFRAA 117 10/06/2017 0850   Lab Results   Component Value Date   HGBA1C 5.2 10/06/2017   HGBA1C 5.1 04/06/2014   Lab Results  Component Value Date   INSULIN 8.8 10/06/2017   CBC    Component Value Date/Time   WBC 9.6 10/06/2017 0850   WBC 9.8 11/22/2016 1521  RBC 4.71 10/06/2017 0850   RBC 4.41 11/22/2016 1521   HGB 13.8 10/06/2017 0850   HCT 42.4 10/06/2017 0850   PLT 328 11/22/2016 1521   PLT 381 11/13/2009   MCV 90 10/06/2017 0850   MCH 29.3 10/06/2017 0850   MCH 29.7 11/22/2016 1521   MCHC 32.5 10/06/2017 0850   MCHC 32.2 11/22/2016 1521   RDW 13.5 10/06/2017 0850   LYMPHSABS 1.8 10/06/2017 0850   MONOABS 0.5 05/26/2015 0931   EOSABS 0.1 10/06/2017 0850   BASOSABS 0.1 10/06/2017 0850   Iron/TIBC/Ferritin/ %Sat    Component Value Date/Time   FERRITIN 50 11/09/2008   Lipid Panel     Component Value Date/Time   CHOL 140 10/06/2017 0850   TRIG 112 10/06/2017 0850   TRIG 138 11/13/2009 1337   HDL 48 10/06/2017 0850   CHOLHDL 3.9 11/22/2016 1521   VLDL 32 (H) 11/22/2016 1521   LDLCALC 70 10/06/2017 0850   Hepatic Function Panel     Component Value Date/Time   PROT 6.9 10/06/2017 0850   ALBUMIN 4.4 10/06/2017 0850   AST 16 10/06/2017 0850   ALT 17 10/06/2017 0850   ALKPHOS 116 10/06/2017 0850   BILITOT 0.4 10/06/2017 0850   BILIDIR 0.1 05/26/2015 0931      Component Value Date/Time   TSH 1.690 10/06/2017 0850   TSH 1.30 11/22/2016 1521   TSH 1.67 05/26/2015 0931     Ref. Range 10/06/2017 08:50  Vitamin D, 25-Hydroxy Latest Ref Range: 30.0 - 100.0 ng/mL 36.9   ASSESSMENT AND PLAN: Vitamin D deficiency - Plan: Vitamin D, Ergocalciferol, (DRISDOL) 50000 units CAPS capsule  Insulin resistance  At risk for osteoporosis  Class 2 severe obesity with serious comorbidity and body mass index (BMI) of 35.0 to 35.9 in adult, unspecified obesity type ([place])  PLAN:  Vitamin D Deficiency Zanyiah was informed that low vitamin D levels contributes to fatigue and are associated with obesity, breast,  and colon cancer. She agrees to continue to take prescription Vit D @50 ,000 IU every week #4 with no refills and will follow up for routine testing of vitamin D, at least 2-3 times per year. She was informed of the risk of over-replacement of vitamin D and agrees to not increase her dose unless she discusses this with [name] first. Elyn agrees to follow up with our clinic in 2 weeks.  At risk for osteopenia and osteoporosis Machel is at risk for osteopenia and osteoporosis due to her vitamin D deficiency. She was encouraged to take her vitamin D and follow her higher calcium diet and increase strengthening exercise to help strengthen her bones and decrease her risk of osteopenia and osteoporosis.  Insulin Resistance Shuntay will continue to work on weight loss, exercise, and decreasing simple carbohydrates in her diet to help decrease the risk of diabetes. We dicussed metformin including benefits and risks. She was informed that eating too many simple carbohydrates or too many calories at one sitting increases the likelihood of GI side effects. Denia declined metformin for now and prescription was not written today. Hennie agreed to follow up with [name] as directed to monitor her progress.  Obesity Rilynne is currently in the action stage of change. As such, her goal is to continue with weight loss efforts She has agreed to follow the Category 2 plan Undra has been instructed to work up to a goal of 150 minutes of combined cardio and strengthening exercise per week for weight loss and overall health benefits. We discussed  the following Behavioral Modification Strategies today: dealing with family or coworker sabotage and planning for success  Xaria has agreed to follow up with our clinic in 2 weeks. She was informed of the importance of frequent follow up visits to maximize her success with intensive lifestyle modifications for her multiple health conditions.   OBESITY BEHAVIORAL INTERVENTION  VISIT  Today's visit was # 8 out of 22.  Starting weight: 212 lbs Starting date: 10/06/17 Today's weight : 214 lbs  Today's date: 02/17/2018 Total lbs lost to date: 0 (Patients must lose 7 lbs in the first 6 months to continue with counseling)   ASK: We discussed the diagnosis of obesity with Hebert Soho today and Keyarra agreed to give [name] permission to discuss obesity behavioral modification therapy today.  ASSESS: Faigy has the diagnosis of obesity and her BMI today is 35.61 Aquanetta is in the action stage of change   ADVISE: Karlina was educated on the multiple health risks of obesity as well as the benefit of weight loss to improve her health. She was advised of the need for long term treatment and the importance of lifestyle modifications.  AGREE: Multiple dietary modification options and treatment options were discussed and  Eknoor agreed to the above obesity treatment plan.  Corey Skains, am acting as transcriptionist for [employer], PA-C I, Lacy Duverney [facility], have reviewed this note and agree with its content.

## 2018-02-19 MED FILL — VIT D2 1.25 MG (50,000 UNIT: 1.25 MG | 28 days supply | Qty: 4 | Fill #0

## 2018-03-10 ENCOUNTER — Ambulatory Visit (INDEPENDENT_AMBULATORY_CARE_PROVIDER_SITE_OTHER): Payer: 59 | Admitting: Physician Assistant

## 2018-03-10 VITALS — BP 115/73 | HR 87 | Temp 97.8°F | Ht 65.0 in | Wt 213.0 lb

## 2018-03-10 DIAGNOSIS — E8881 Metabolic syndrome: Secondary | ICD-10-CM

## 2018-03-10 DIAGNOSIS — Z9189 Other specified personal risk factors, not elsewhere classified: Secondary | ICD-10-CM | POA: Diagnosis not present

## 2018-03-10 DIAGNOSIS — Z6835 Body mass index (BMI) 35.0-35.9, adult: Secondary | ICD-10-CM | POA: Diagnosis not present

## 2018-03-10 DIAGNOSIS — E559 Vitamin D deficiency, unspecified: Secondary | ICD-10-CM

## 2018-03-10 MED ORDER — VITAMIN D (ERGOCALCIFEROL) 1.25 MG (50000 UNIT) PO CAPS: 50000.0000 [IU] | ORAL_CAPSULE | ORAL | 0 refills | Status: DC

## 2018-03-11 NOTE — Progress Notes (Signed)
Office: [phone]  /  Fax: [phone]   HPI:   Chief Complaint: OBESITY Anicia is here to discuss her progress with her obesity treatment plan. She is on the Category 2 plan and is following her eating plan approximately 85 % of the time. She states she is swimming and therapy bands for 30 minutes 3 times per week. Ebelyn is back to incorporating more protein into her diet and states her hunger is well controlled.  Her weight is 213 lb (96.6 kg) today and has had a weight loss of 1 pound over a period of 3 weeks since her last visit. She has lost 0 lbs since starting treatment with [name].  Vitamin D Deficiency Valera has a diagnosis of vitamin D deficiency. She is currently taking prescription Vit D and denies nausea, vomiting or muscle weakness.  At risk for osteopenia and osteoporosis Margaretta is at higher risk of osteopenia and osteoporosis due to vitamin D deficiency.   Insulin Resistance Yakima has a diagnosis of insulin resistance based on her elevated fasting insulin level >5. Although Myrtha's blood glucose readings are still under good control, insulin resistance puts her at greater risk of metabolic syndrome and diabetes. She is not taking metformin currently and continues to work on diet and exercise to decrease risk of diabetes. She denies polyphagia.  ALLERGIES: Allergies  Allergen Reactions  . Coconut Fatty Acids Hives  . Codeine   . Hydrocod Polst-Cpm Polst Er     REACTION: rash on face and head, itching  . Mango Flavor Hives  . [name] Other (See Comments)  . Oxycodone-Acetaminophen     REACTION: rash on face and head, itching  . Tussin Cough [Dextromethorphan Hbr]     Broken out in hives  . Ultram [Tramadol] Other (See Comments)    Dizzy and low blood pressure.     MEDICATIONS: Current Outpatient Medications on File Prior to Visit  Medication Sig Dispense Refill  . acetaminophen (TYLENOL) 650 MG CR tablet Take 650 mg by mouth every 8 (eight) hours as  needed. pain    . Calcium Carbonate-Vit D-Min (CALCIUM 1200 PO) Take by mouth daily.      . cetirizine (ZYRTEC) 10 MG tablet Take 10 mg by mouth daily.    . Cholecalciferol (VITAMIN D-1000 MAX ST PO) Take 1 tablet by mouth.    . escitalopram (LEXAPRO) 10 MG tablet Take 1 tablet (10 mg total) by mouth daily. 90 tablet 3  . famotidine (PEPCID) 20 MG tablet Take 20 mg daily as needed by mouth.     Marland Kitchen KRILL OIL 1000 MG CAPS Take 1 capsule by mouth daily.    . mometasone (NASONEX) 50 MCG/ACT nasal spray Place 2 sprays into the nose daily. 17 g 1  . Polyethyl Glycol-Propyl Glycol (SYSTANE FREE OP) Apply 1 drop to eye daily.     No current facility-administered medications on file prior to visit.     PAST MEDICAL HISTORY: Past Medical History:  Diagnosis Date  . Allergic state 12/08/2016  . Allergy   . Anxiety   . Back pain   . GERD (gastroesophageal reflux disease)   . Hyperlipidemia 06/09/2017  . Joint pain   . Knee pain, right 03/10/2017  . Plantar fasciitis   . Shortness of breath   . Sinusitis   . Vitamin D deficiency 11/22/2016  . Vitamin D deficiency     PAST SURGICAL HISTORY: Past Surgical History:  Procedure Laterality Date  . COLONOSCOPY  02-2009   with Henrene Pastor  normal  . ESOPHAGOGASTRODUODENOSCOPY  2012  . GANGLION CYST EXCISION Right    right- wrist   . NASAL SEPTUM SURGERY    . NASAL SINUS SURGERY    . TMJ ARTHROSCOPY     left    SOCIAL HISTORY: Social History   Tobacco Use  . Smoking status: Never Smoker  . Smokeless tobacco: Never Used  Substance Use Topics  . Alcohol use: Yes    Alcohol/week: 0.0 oz    Comment: rare  . Drug use: No    FAMILY HISTORY: Family History  Problem Relation Age of Onset  . Hypertension Father   . Diabetes Father   . Hyperlipidemia Father   . Breast cancer Mother 68  . Cancer Mother        breast  . Thyroid disease Mother   . Sleep apnea Mother   . Colon cancer Paternal Grandfather        died at age 20  . Cancer  Paternal Grandfather        GI CA  . Obesity Sister   . Diabetes Sister   . Hypertension Sister   . Heart disease Maternal Grandmother        MI  . Cancer Maternal Grandfather        lung, smoker  . Diabetes Maternal Grandfather   . Diabetes Paternal Grandmother   . Heart disease Paternal Grandmother   . Pancreatitis Sister   . Esophageal cancer Neg Hx   . Stomach cancer Neg Hx   . Stroke Neg Hx   . Colon polyps Neg Hx   . Rectal cancer Neg Hx   . Pancreatic cancer Neg Hx     ROS: Review of Systems  Constitutional: Positive for weight loss.  Gastrointestinal: Negative for nausea and vomiting.  Musculoskeletal:       Negative muscle weakness  Endo/Heme/Allergies:       Negative polyphagia    PHYSICAL EXAM: Blood pressure 115/73, pulse 87, temperature 97.8 F (36.6 C), temperature source Oral, height 5\' 5"  (5.427 m), weight 213 lb (96.6 kg), SpO2 95 %. Body mass index is 35.45 kg/m. Physical Exam  Constitutional: She is oriented to person, place, and time. She appears well-developed and well-nourished.  Cardiovascular: Normal rate.  Pulmonary/Chest: Effort normal.  Musculoskeletal: Normal range of motion.  Neurological: She is oriented to person, place, and time.  Skin: Skin is warm and dry.  Psychiatric: She has a normal mood and affect. Her behavior is normal.  Vitals reviewed.   RECENT LABS AND TESTS: BMET    Component Value Date/Time   NA 141 10/06/2017 0850   K 4.1 10/06/2017 0850   CL 102 10/06/2017 0850   CO2 24 10/06/2017 0850   GLUCOSE 76 10/06/2017 0850   GLUCOSE 85 11/22/2016 1521   GLUCOSE 86 11/13/2009   BUN 10 10/06/2017 0850   CREATININE 0.67 10/06/2017 0850   CREATININE 0.67 11/22/2016 1521   CALCIUM 9.0 10/06/2017 0850   GFRNONAA 101 10/06/2017 0850   GFRAA 117 10/06/2017 0850   Lab Results  Component Value Date   HGBA1C 5.2 10/06/2017   HGBA1C 5.1 04/06/2014   Lab Results  Component Value Date   INSULIN 8.8 10/06/2017   CBC      Component Value Date/Time   WBC 9.6 10/06/2017 0850   WBC 9.8 11/22/2016 1521   RBC 4.71 10/06/2017 0850   RBC 4.41 11/22/2016 1521   HGB 13.8 10/06/2017 0850   HCT 42.4 10/06/2017 0850   PLT  328 11/22/2016 1521   PLT 381 11/13/2009   MCV 90 10/06/2017 0850   MCH 29.3 10/06/2017 0850   MCH 29.7 11/22/2016 1521   MCHC 32.5 10/06/2017 0850   MCHC 32.2 11/22/2016 1521   RDW 13.5 10/06/2017 0850   LYMPHSABS 1.8 10/06/2017 0850   MONOABS 0.5 05/26/2015 0931   EOSABS 0.1 10/06/2017 0850   BASOSABS 0.1 10/06/2017 0850   Iron/TIBC/Ferritin/ %Sat    Component Value Date/Time   FERRITIN 50 11/09/2008   Lipid Panel     Component Value Date/Time   CHOL 140 10/06/2017 0850   TRIG 112 10/06/2017 0850   TRIG 138 11/13/2009 1337   HDL 48 10/06/2017 0850   CHOLHDL 3.9 11/22/2016 1521   VLDL 32 (H) 11/22/2016 1521   LDLCALC 70 10/06/2017 0850   Hepatic Function Panel     Component Value Date/Time   PROT 6.9 10/06/2017 0850   ALBUMIN 4.4 10/06/2017 0850   AST 16 10/06/2017 0850   ALT 17 10/06/2017 0850   ALKPHOS 116 10/06/2017 0850   BILITOT 0.4 10/06/2017 0850   BILIDIR 0.1 05/26/2015 0931      Component Value Date/Time   TSH 1.690 10/06/2017 0850   TSH 1.30 11/22/2016 1521   TSH 1.67 05/26/2015 0931  Results for Aycock, Janyah I (MRN [id number]) as of 03/11/2018 07:38  Ref. Range 10/06/2017 08:50  Vitamin D, 25-Hydroxy Latest Ref Range: 30.0 - 100.0 ng/mL 36.9    ASSESSMENT AND PLAN: Vitamin D deficiency - Plan: Vitamin D, Ergocalciferol, (DRISDOL) 50000 units CAPS capsule  Insulin resistance  At risk for osteopenia  Class 2 severe obesity with serious comorbidity and body mass index (BMI) of 35.0 to 35.9 in adult, unspecified obesity type ([place])  PLAN:  Vitamin D Deficiency Berkley was informed that low vitamin D levels contributes to fatigue and are associated with obesity, breast, and colon cancer. Brighton agrees to continue taking prescription Vit D @50 ,000 IU every  week #4 and we will refill for 1 month. She will follow up for routine testing of vitamin D, at least 2-3 times per year. She was informed of the risk of over-replacement of vitamin D and agrees to not increase her dose unless she discusses this with [name] first. Lunah agrees to follow up with our clinic in 2 weeks.  At risk for osteopenia and osteoporosis Orian is at risk for osteopenia and osteoporsis due to her vitamin D deficiency. She was encouraged to take her vitamin D and follow her higher calcium diet and increase strengthening exercise to help strengthen her bones and decrease her risk of osteopenia and osteoporosis.  Insulin Resistance Rettie will continue to work on weight loss, diet, exercise, and decreasing simple carbohydrates in her diet to help decrease the risk of diabetes. We dicussed metformin including benefits and risks. She was informed that eating too many simple carbohydrates or too many calories at one sitting increases the likelihood of GI side effects. Veora declined metformin for now and prescription was not written today. Makenah agrees to follow up with our clinic in 2 weeks as directed to monitor her progress.  Obesity Freyja is currently in the action stage of change. As such, her goal is to continue with weight loss efforts She has agreed to follow the Category 2 plan Kathrin has been instructed to work up to a goal of 150 minutes of combined cardio and strengthening exercise per week for weight loss and overall health benefits. We discussed the following Behavioral Modification Strategies today: increasing  lean protein intake and planning for success   Lorisa has agreed to follow up with our clinic in 2 weeks. She was informed of the importance of frequent follow up visits to maximize her success with intensive lifestyle modifications for her multiple health conditions.   OBESITY BEHAVIORAL INTERVENTION VISIT  Today's visit was # 9 out of 22.  Starting weight:  212 lbs Starting date: 10/06/17 Today's weight : 213 lbs Today's date: 03/10/2018 Total lbs lost to date: 0 (Patients must lose 7 lbs in the first 6 months to continue with counseling)   ASK: We discussed the diagnosis of obesity with Hebert Soho today and Priscilla agreed to give [name] permission to discuss obesity behavioral modification therapy today.  ASSESS: Vernita has the diagnosis of obesity and her BMI today is 35.44 Wenona is in the action stage of change   ADVISE: Lanyla was educated on the multiple health risks of obesity as well as the benefit of weight loss to improve her health. She was advised of the need for long term treatment and the importance of lifestyle modifications.  AGREE: Multiple dietary modification options and treatment options were discussed and  Layanna agreed to the above obesity treatment plan.   Wilhemena Durie, am acting as transcriptionist for Lacy Duverney, PA-C I, Lacy Duverney [facility], have reviewed this note and agree with its content

## 2018-03-13 MED FILL — VIT D2 1.25 MG (50,000 UNIT: 1.25 MG | 28 days supply | Qty: 4 | Fill #0

## 2018-03-20 MED FILL — ESCITALOPRAM 10 MG TABLET: 10 | 90 days supply | Qty: 90 | Fill #1

## 2018-03-31 ENCOUNTER — Ambulatory Visit (INDEPENDENT_AMBULATORY_CARE_PROVIDER_SITE_OTHER): Payer: 59 | Admitting: Physician Assistant

## 2018-03-31 VITALS — BP 132/85 | HR 72 | Temp 98.5°F | Ht 65.0 in | Wt 214.0 lb

## 2018-03-31 DIAGNOSIS — E8881 Metabolic syndrome: Secondary | ICD-10-CM | POA: Diagnosis not present

## 2018-03-31 DIAGNOSIS — Z6835 Body mass index (BMI) 35.0-35.9, adult: Secondary | ICD-10-CM

## 2018-03-31 DIAGNOSIS — E559 Vitamin D deficiency, unspecified: Secondary | ICD-10-CM | POA: Diagnosis not present

## 2018-03-31 DIAGNOSIS — Z9189 Other specified personal risk factors, not elsewhere classified: Secondary | ICD-10-CM

## 2018-03-31 MED ORDER — VITAMIN D (ERGOCALCIFEROL) 1.25 MG (50000 UNIT) PO CAPS: 50000.0000 [IU] | ORAL_CAPSULE | ORAL | 0 refills | Status: DC

## 2018-03-31 MED ORDER — METFORMIN HCL 500 MG PO TABS: 500.0000 mg | ORAL_TABLET | Freq: Every day | ORAL | 0 refills | Status: DC

## 2018-03-31 MED FILL — metFORMIN HCL 500 MG TABS: 500 | 30 days supply | Qty: 30 | Fill #0

## 2018-03-31 NOTE — Progress Notes (Signed)
Office: [phone]  /  Fax: [phone]   HPI:   Chief Complaint: OBESITY Hailey Noble is here to discuss her progress with her obesity treatment plan. She is on the Category 2 plan and is following her eating plan approximately 85-90 % of the time. She states she is walking and resistance bands for 20-30 minutes 3 times per week, 10,000 steps a day for 5 days a week. Hailey Noble had challenges getting the required protein on the meal plan and noticed hunger is not as well controlled.  Her weight is 214 lb (97.1 kg) today and has gained 1 pound since her last visit. She has lost 0 lbs since starting treatment with [name].  Vitamin D Deficiency Hailey Noble has a diagnosis of vitamin D deficiency. She is currently taking prescription Vit D and denies nausea, vomiting or muscle weakness.  At risk for osteopenia and osteoporosis Hailey Noble is at higher risk of osteopenia and osteoporosis due to vitamin D deficiency.   Insulin Resistance Hailey Noble has a diagnosis of insulin resistance based on her elevated fasting insulin level >5. Although Hailey Noble's blood glucose readings are still under good control, insulin resistance puts her at greater risk of metabolic syndrome and diabetes. She is not taking metformin currently and continues to work on diet and exercise to decrease risk of diabetes. She notes polyphagia.  ALLERGIES: Allergies  Allergen Reactions  . Coconut Fatty Acids Hives  . Codeine   . Hydrocod Polst-Cpm Polst Er     REACTION: rash on face and head, itching  . Mango Flavor Hives  . [name] Other (See Comments)  . Oxycodone-Acetaminophen     REACTION: rash on face and head, itching  . Tussin Cough [Dextromethorphan Hbr]     Broken out in hives  . Ultram [Tramadol] Other (See Comments)    Dizzy and low blood pressure.     MEDICATIONS: Current Outpatient Medications on File Prior to Visit  Medication Sig Dispense Refill  . acetaminophen (TYLENOL) 650 MG CR tablet Take 650 mg by mouth every 8  (eight) hours as needed. pain    . Calcium Carbonate-Vit D-Min (CALCIUM 1200 PO) Take by mouth daily.      . cetirizine (ZYRTEC) 10 MG tablet Take 10 mg by mouth daily.    . Cholecalciferol (VITAMIN D-1000 MAX ST PO) Take 1 tablet by mouth.    . escitalopram (LEXAPRO) 10 MG tablet Take 1 tablet (10 mg total) by mouth daily. 90 tablet 3  . famotidine (PEPCID) 20 MG tablet Take 20 mg daily as needed by mouth.     Marland Kitchen KRILL OIL 1000 MG CAPS Take 1 capsule by mouth daily.    . mometasone (NASONEX) 50 MCG/ACT nasal spray Place 2 sprays into the nose daily. 17 g 1  . Polyethyl Glycol-Propyl Glycol (SYSTANE FREE OP) Apply 1 drop to eye daily.     No current facility-administered medications on file prior to visit.     PAST MEDICAL HISTORY: Past Medical History:  Diagnosis Date  . Allergic state 12/08/2016  . Allergy   . Anxiety   . Back pain   . GERD (gastroesophageal reflux disease)   . Hyperlipidemia 06/09/2017  . Joint pain   . Knee pain, right 03/10/2017  . Plantar fasciitis   . Shortness of breath   . Sinusitis   . Vitamin D deficiency 11/22/2016  . Vitamin D deficiency     PAST SURGICAL HISTORY: Past Surgical History:  Procedure Laterality Date  . COLONOSCOPY  02-2009   with  [place]ry normal  . ESOPHAGOGASTRODUODENOSCOPY  2012  . GANGLION CYST EXCISION Right    right- wrist   . NASAL SEPTUM SURGERY    . NASAL SINUS SURGERY    . TMJ ARTHROSCOPY     left    SOCIAL HISTORY: Social History   Tobacco Use  . Smoking status: Never Smoker  . Smokeless tobacco: Never Used  Substance Use Topics  . Alcohol use: Yes    Alcohol/week: 0.0 oz    Comment: rare  . Drug use: No    FAMILY HISTORY: Family History  Problem Relation Age of Onset  . Hypertension Father   . Diabetes Father   . Hyperlipidemia Father   . Breast cancer Mother 59  . Cancer Mother        breast  . Thyroid disease Mother   . Sleep apnea Mother   . Colon cancer Paternal Grandfather        died at age 47    . Cancer Paternal Grandfather        GI CA  . Obesity Sister   . Diabetes Sister   . Hypertension Sister   . Heart disease Maternal Grandmother        MI  . Cancer Maternal Grandfather        lung, smoker  . Diabetes Maternal Grandfather   . Diabetes Paternal Grandmother   . Heart disease Paternal Grandmother   . Pancreatitis Sister   . Esophageal cancer Neg Hx   . Stomach cancer Neg Hx   . Stroke Neg Hx   . Colon polyps Neg Hx   . Rectal cancer Neg Hx   . Pancreatic cancer Neg Hx     ROS: Review of Systems  Constitutional: Negative for weight loss.  Gastrointestinal: Negative for nausea and vomiting.  Musculoskeletal:       Negative muscle weakness  Endo/Heme/Allergies:       Positive polyphagia    PHYSICAL EXAM: Blood pressure 132/85, pulse 72, temperature 98.5 F (36.9 C), temperature source Oral, height 5\' 5"  (1.651 m), weight 214 lb (97.1 kg), SpO2 95 %. Body mass index is 35.61 kg/m. Physical Exam  Constitutional: She is oriented to person, place, and time. She appears well-developed and well-nourished.  Cardiovascular: Normal rate.  Pulmonary/Chest: Effort normal.  Musculoskeletal: Normal range of motion.  Neurological: She is oriented to person, place, and time.  Skin: Skin is warm and dry.  Psychiatric: She has a normal mood and affect. Her behavior is normal.  Vitals reviewed.   RECENT LABS AND TESTS: BMET    Component Value Date/Time   NA 141 10/06/2017 0850   K 4.1 10/06/2017 0850   CL 102 10/06/2017 0850   CO2 24 10/06/2017 0850   GLUCOSE 76 10/06/2017 0850   GLUCOSE 85 11/22/2016 1521   GLUCOSE 86 11/13/2009   BUN 10 10/06/2017 0850   CREATININE 0.67 10/06/2017 0850   CREATININE 0.67 11/22/2016 1521   CALCIUM 9.0 10/06/2017 0850   GFRNONAA 101 10/06/2017 0850   GFRAA 117 10/06/2017 0850   Lab Results  Component Value Date   HGBA1C 5.2 10/06/2017   HGBA1C 5.1 04/06/2014   Lab Results  Component Value Date   INSULIN 8.8 10/06/2017    CBC    Component Value Date/Time   WBC 9.6 10/06/2017 0850   WBC 9.8 11/22/2016 1521   RBC 4.71 10/06/2017 0850   RBC 4.41 11/22/2016 1521   HGB 13.8 10/06/2017 0850   HCT 42.4 10/06/2017 0850  PLT 328 11/22/2016 1521   PLT 381 11/13/2009   MCV 90 10/06/2017 0850   MCH 29.3 10/06/2017 0850   MCH 29.7 11/22/2016 1521   MCHC 32.5 10/06/2017 0850   MCHC 32.2 11/22/2016 1521   RDW 13.5 10/06/2017 0850   LYMPHSABS 1.8 10/06/2017 0850   MONOABS 0.5 05/26/2015 0931   EOSABS 0.1 10/06/2017 0850   BASOSABS 0.1 10/06/2017 0850   Iron/TIBC/Ferritin/ %Sat    Component Value Date/Time   FERRITIN 50 11/09/2008   Lipid Panel     Component Value Date/Time   CHOL 140 10/06/2017 0850   TRIG 112 10/06/2017 0850   TRIG 138 11/13/2009 1337   HDL 48 10/06/2017 0850   CHOLHDL 3.9 11/22/2016 1521   VLDL 32 (H) 11/22/2016 1521   LDLCALC 70 10/06/2017 0850   Hepatic Function Panel     Component Value Date/Time   PROT 6.9 10/06/2017 0850   ALBUMIN 4.4 10/06/2017 0850   AST 16 10/06/2017 0850   ALT 17 10/06/2017 0850   ALKPHOS 116 10/06/2017 0850   BILITOT 0.4 10/06/2017 0850   BILIDIR 0.1 05/26/2015 0931      Component Value Date/Time   TSH 1.690 10/06/2017 0850   TSH 1.30 11/22/2016 1521   TSH 1.67 05/26/2015 0931  Results for Urquidi, Kasidy I (MRN [id number]) as of 03/31/2018 17:50  Ref. Range 10/06/2017 08:50  Vitamin D, 25-Hydroxy Latest Ref Range: 30.0 - 100.0 ng/mL 36.9    ASSESSMENT AND PLAN: Vitamin D deficiency - Plan: Vitamin D, Ergocalciferol, (DRISDOL) 50000 units CAPS capsule  Insulin resistance - Plan: metFORMIN (GLUCOPHAGE) 500 MG tablet  At risk for osteoporosis  Class 2 severe obesity with serious comorbidity and body mass index (BMI) of 35.0 to 35.9 in adult, unspecified obesity type ([place])  PLAN:  Vitamin D Deficiency Hailey Noble was informed that low vitamin D levels contributes to fatigue and are associated with obesity, breast, and colon cancer. Hailey Noble  agrees to continue taking prescription Vit D @50 ,000 IU every week #4 and we will refill for 1 month. She will follow up for routine testing of vitamin D, at least 2-3 times per year. She was informed of the risk of over-replacement of vitamin D and agrees to not increase her dose unless she discusses this with [name] first. Hailey Noble agrees to follow up with our clinic in 2 weeks.  At risk for osteopenia and osteoporosis Hailey Noble is at risk for osteopenia and osteoporsis due to her vitamin D deficiency. She was encouraged to take her vitamin D and follow her higher calcium diet and increase strengthening exercise to help strengthen her bones and decrease her risk of osteopenia and osteoporosis.  Insulin Resistance Hailey Noble will continue to work on weight loss, exercise, and decreasing simple carbohydrates in her diet to help decrease the risk of diabetes. We dicussed metformin including benefits and risks. She was informed that eating too many simple carbohydrates or too many calories at one sitting increases the likelihood of GI side effects. Hailey Noble agrees to start metformin 500 mg q AM #30 with no refills. Hailey Noble agrees to follow up with our clinic in 2 weeks as directed to monitor her progress.  Obesity Hailey Noble is currently in the action stage of change. As such, her goal is to continue with weight loss efforts She has agreed to keep a food journal with 500 calories and 35 grams of protein at supper daily and follow the Category 2 plan Hailey Noble has been instructed to work up to a goal of 150  minutes of combined cardio and strengthening exercise per week for weight loss and overall health benefits. We discussed the following Behavioral Modification Strategies today: increasing lean protein intake and keep a strict food journa   Hailey Noble has agreed to follow up with our clinic in 2 weeks. She was informed of the importance of frequent follow up visits to maximize her success with intensive lifestyle modifications  for her multiple health conditions.   OBESITY BEHAVIORAL INTERVENTION VISIT  Today's visit was # 10 out of 22.  Starting weight: 212 lbs Starting date: 10/06/17 Today's weight : 214 lbs Today's date: 03/31/2018 Total lbs lost to date: 0 (Patients must lose 7 lbs in the first 6 months to continue with counseling)   ASK: We discussed the diagnosis of obesity with Hailey Noble today and Hailey Noble agreed to give [name] permission to discuss obesity behavioral modification therapy today.  ASSESS: Hailey Noble has the diagnosis of obesity and her BMI today is 35.61 Hailey Noble is in the action stage of change   ADVISE: Hailey Noble was educated on the multiple health risks of obesity as well as the benefit of weight loss to improve her health. She was advised of the need for long term treatment and the importance of lifestyle modifications.  AGREE: Multiple dietary modification options and treatment options were discussed and  Hailey Noble agreed to the above obesity treatment plan.   Hailey Noble, am acting as transcriptionist for Hailey Duverney, PA-C I, Hailey Noble [facility], have reviewed this note and agree with its content

## 2018-04-14 ENCOUNTER — Ambulatory Visit (INDEPENDENT_AMBULATORY_CARE_PROVIDER_SITE_OTHER): Payer: 59 | Admitting: Physician Assistant

## 2018-04-14 VITALS — BP 148/82 | HR 81 | Temp 98.2°F | Ht 65.0 in | Wt 214.0 lb

## 2018-04-14 DIAGNOSIS — R030 Elevated blood-pressure reading, without diagnosis of hypertension: Secondary | ICD-10-CM | POA: Diagnosis not present

## 2018-04-14 DIAGNOSIS — Z6835 Body mass index (BMI) 35.0-35.9, adult: Secondary | ICD-10-CM | POA: Diagnosis not present

## 2018-04-15 NOTE — Progress Notes (Signed)
Office: [phone]  /  Fax: [phone]   HPI:   Chief Complaint: Hailey Hailey Noble is here to discuss her progress with her Hailey treatment plan. She is on the keep a food journal with 500 calories and 35 grams of protein at supper daily and follow the Category 2 plan and is following her eating plan approximately 90 % of the time. She states she is doing yard work and swimming for 60 minutes 1 time per week. Hailey Noble maintained her weight. She states metformin helped with excessive hunger and cravings.  Her weight is 214 lb (97.1 kg) today and has maintained weight over a period of 2 weeks since her last visit. She has gained 2 lbs since starting treatment with [name].  Elevated Blood Noble without hypertension Hailey Noble is a 52 y.o. female with elevated blood Noble today. She has no history of hypertension. She was advised to keep a blood Noble log and bring in for review. Hailey Noble denies chest pain or shortness of breath on exertion. She is working weight loss to help control her blood Noble with the goal of decreasing her risk of heart attack and stroke. Hailey Noble is not currently controlled.  ALLERGIES: Allergies  Allergen Reactions  . Coconut Fatty Acids Hives  . Codeine   . Hydrocod Polst-Cpm Polst Er     REACTION: rash on face and head, itching  . Mango Flavor Hives  . [name] Other (See Comments)  . Oxycodone-Acetaminophen     REACTION: rash on face and head, itching  . Tussin Cough [Dextromethorphan Hbr]     Broken out in hives  . Ultram [Tramadol] Other (See Comments)    Dizzy and low blood Noble.     MEDICATIONS: Current Outpatient Medications on File Prior to Visit  Medication Sig Dispense Refill  . acetaminophen (TYLENOL) 650 MG CR tablet Take 650 mg by mouth every 8 (eight) hours as needed. pain    . Calcium Carbonate-Vit D-Min (CALCIUM 1200 PO) Take by mouth daily.      . cetirizine (ZYRTEC) 10 MG tablet Take 10 mg by mouth  daily.    . Cholecalciferol (VITAMIN D-1000 MAX ST PO) Take 1 tablet by mouth.    . escitalopram (LEXAPRO) 10 MG tablet Take 1 tablet (10 mg total) by mouth daily. 90 tablet 3  . famotidine (PEPCID) 20 MG tablet Take 20 mg daily as needed by mouth.     Hailey Noble Kitchen KRILL OIL 1000 MG CAPS Take 1 capsule by mouth daily.    . metFORMIN (GLUCOPHAGE) 500 MG tablet Take 1 tablet (500 mg total) by mouth daily with breakfast. 30 tablet 0  . mometasone (NASONEX) 50 MCG/ACT nasal spray Place 2 sprays into the nose daily. 17 g 1  . Polyethyl Glycol-Propyl Glycol (SYSTANE FREE OP) Apply 1 drop to eye daily.    . Vitamin D, Ergocalciferol, (DRISDOL) 50000 units CAPS capsule Take 1 capsule (50,000 Units total) by mouth every 7 (seven) days. 4 capsule 0   No current facility-administered medications on file prior to visit.     PAST MEDICAL HISTORY: Past Medical History:  Diagnosis Date  . Allergic state 12/08/2016  . Allergy   . Anxiety   . Back pain   . GERD (gastroesophageal reflux disease)   . Hyperlipidemia 06/09/2017  . Joint pain   . Knee pain, right 03/10/2017  . Plantar fasciitis   . Shortness of breath   . Sinusitis   . Vitamin D deficiency 11/22/2016  .  Vitamin D deficiency     PAST SURGICAL HISTORY: Past Surgical History:  Procedure Laterality Date  . COLONOSCOPY  02-2009   with Henrene Pastor normal  . ESOPHAGOGASTRODUODENOSCOPY  2012  . GANGLION CYST EXCISION Right    right- wrist   . NASAL SEPTUM SURGERY    . NASAL SINUS SURGERY    . TMJ ARTHROSCOPY     left    SOCIAL HISTORY: Social History   Tobacco Use  . Smoking status: Never Smoker  . Smokeless tobacco: Never Used  Substance Use Topics  . Alcohol use: Yes    Alcohol/week: 0.0 oz    Comment: rare  . Drug use: No    FAMILY HISTORY: Family History  Problem Relation Age of Onset  . Hypertension Father   . Diabetes Father   . Hyperlipidemia Father   . Breast cancer Mother 51  . Cancer Mother        breast  . Thyroid disease  Mother   . Sleep apnea Mother   . Colon cancer Paternal Grandfather        died at age 56  . Cancer Paternal Grandfather        GI CA  . Hailey Sister   . Diabetes Sister   . Hypertension Sister   . Heart disease Maternal Grandmother        MI  . Cancer Maternal Grandfather        lung, smoker  . Diabetes Maternal Grandfather   . Diabetes Paternal Grandmother   . Heart disease Paternal Grandmother   . Pancreatitis Sister   . Esophageal cancer Neg Hx   . Stomach cancer Neg Hx   . Stroke Neg Hx   . Colon polyps Neg Hx   . Rectal cancer Neg Hx   . Pancreatic cancer Neg Hx     ROS: Review of Systems  Constitutional: Negative for weight loss.  Respiratory: Negative for shortness of breath (on exertion).   Cardiovascular: Negative for chest pain.    PHYSICAL EXAM: Blood Noble (!) 148/82, pulse 81, temperature 98.2 F (36.8 C), temperature source Oral, height 5\' 5"  (1.651 m), weight 214 lb (97.1 kg), SpO2 95 %. Body mass index is 35.61 kg/m. Physical Exam  Constitutional: She is oriented to person, place, and time. She appears well-developed and well-nourished.  Cardiovascular: Normal rate.  Musculoskeletal: Normal range of motion.  Neurological: She is oriented to person, place, and time.  Skin: Skin is warm and dry.  Psychiatric: She has a normal mood and affect. Her behavior is normal.  Vitals reviewed.   RECENT LABS AND TESTS: BMET    Component Value Date/Time   NA 141 10/06/2017 0850   K 4.1 10/06/2017 0850   CL 102 10/06/2017 0850   CO2 24 10/06/2017 0850   GLUCOSE 76 10/06/2017 0850   GLUCOSE 85 11/22/2016 1521   GLUCOSE 86 11/13/2009   BUN 10 10/06/2017 0850   CREATININE 0.67 10/06/2017 0850   CREATININE 0.67 11/22/2016 1521   CALCIUM 9.0 10/06/2017 0850   GFRNONAA 101 10/06/2017 0850   GFRAA 117 10/06/2017 0850   Lab Results  Component Value Date   HGBA1C 5.2 10/06/2017   HGBA1C 5.1 04/06/2014   Lab Results  Component Value Date   INSULIN  8.8 10/06/2017   CBC    Component Value Date/Time   WBC 9.6 10/06/2017 0850   WBC 9.8 11/22/2016 1521   RBC 4.71 10/06/2017 0850   RBC 4.41 11/22/2016 1521   HGB 13.8 10/06/2017  0850   HCT 42.4 10/06/2017 0850   PLT 328 11/22/2016 1521   PLT 381 11/13/2009   MCV 90 10/06/2017 0850   MCH 29.3 10/06/2017 0850   MCH 29.7 11/22/2016 1521   MCHC 32.5 10/06/2017 0850   MCHC 32.2 11/22/2016 1521   RDW 13.5 10/06/2017 0850   LYMPHSABS 1.8 10/06/2017 0850   MONOABS 0.5 05/26/2015 0931   EOSABS 0.1 10/06/2017 0850   BASOSABS 0.1 10/06/2017 0850   Iron/TIBC/Ferritin/ %Sat    Component Value Date/Time   FERRITIN 50 11/09/2008   Lipid Panel     Component Value Date/Time   CHOL 140 10/06/2017 0850   TRIG 112 10/06/2017 0850   TRIG 138 11/13/2009 1337   HDL 48 10/06/2017 0850   CHOLHDL 3.9 11/22/2016 1521   VLDL 32 (H) 11/22/2016 1521   LDLCALC 70 10/06/2017 0850   Hepatic Function Panel     Component Value Date/Time   PROT 6.9 10/06/2017 0850   ALBUMIN 4.4 10/06/2017 0850   AST 16 10/06/2017 0850   ALT 17 10/06/2017 0850   ALKPHOS 116 10/06/2017 0850   BILITOT 0.4 10/06/2017 0850   BILIDIR 0.1 05/26/2015 0931      Component Value Date/Time   TSH 1.690 10/06/2017 0850   TSH 1.30 11/22/2016 1521   TSH 1.67 05/26/2015 0931   Results for Wiler, Jaeliana I (MRN [id number]) as of 04/15/2018 10:03  Ref. Range 10/06/2017 08:50  Vitamin D, 25-Hydroxy Latest Ref Range: 30.0 - 100.0 ng/mL 36.9   ASSESSMENT AND PLAN: Elevated blood Noble reading without diagnosis of hypertension  Class 2 severe Hailey with serious comorbidity and body mass index (BMI) of 35.0 to 35.9 in adult, unspecified Hailey type (HCC)  PLAN:  Elevated Blood Noble without hypertension We discussed sodium restriction, working on healthy weight loss, and a regular exercise program as the means to achieve improved blood Noble control. Hailey Noble agreed with this plan and agreed to follow up as directed.  We will continue to monitor her blood Noble as well as her progress with the above lifestyle modifications. She will watch for signs of hypotension as she continues her lifestyle modifications.  We spent > than 50% of the 15 minute visit on the counseling as documented in the note.  Hailey Noble is currently in the action stage of change. As such, her goal is to continue with weight loss efforts She has agreed to follow the Category 2 plan Hailey Noble has been instructed to work up to a goal of 150 minutes of combined cardio and strengthening exercise per week for weight loss and overall health benefits. We discussed the following Behavioral Modification Strategies today: increasing lean protein intake and work on meal planning and easy cooking plans  Hailey Noble has agreed to follow up with our clinic in 2 weeks. She was informed of the importance of frequent follow up visits to maximize her success with intensive lifestyle modifications for her multiple health conditions.   Hailey BEHAVIORAL INTERVENTION VISIT  Today's visit was # 11 out of 22.  Starting weight: 212 lbs Starting date: 10/06/17 Today's weight : 214 lbs  Today's date: 04/14/2018 Total lbs lost to date: 0 (Patients must lose 7 lbs in the first 6 months to continue with counseling)   ASK: We discussed the diagnosis of Hailey with Hailey Noble today and Hailey Noble agreed to give [name] permission to discuss Hailey behavioral modification therapy today.  ASSESS: Tishawna has the diagnosis of Hailey and her BMI today is 35.61 Hailey Noble is  in the action stage of change   ADVISE: Hailey Noble was educated on the multiple health risks of Hailey as well as the benefit of weight loss to improve her health. She was advised of the need for long term treatment and the importance of lifestyle modifications.  AGREE: Multiple dietary modification options and treatment options were discussed and  Hailey Noble agreed to the above Hailey treatment  plan.   Corey Skains, am acting as transcriptionist for [employer], PA-C I, Lacy Duverney [facility], have reviewed this note and agree with its content

## 2018-04-17 MED FILL — VIT D2 1.25 MG (50,000 UNIT: 1.25 MG | 28 days supply | Qty: 4 | Fill #0

## 2018-04-30 ENCOUNTER — Ambulatory Visit (INDEPENDENT_AMBULATORY_CARE_PROVIDER_SITE_OTHER): Payer: 59 | Admitting: Physician Assistant

## 2018-04-30 VITALS — BP 119/76 | HR 77 | Temp 98.4°F | Ht 65.0 in | Wt 211.0 lb

## 2018-04-30 DIAGNOSIS — E7849 Other hyperlipidemia: Secondary | ICD-10-CM | POA: Diagnosis not present

## 2018-04-30 DIAGNOSIS — Z6835 Body mass index (BMI) 35.0-35.9, adult: Secondary | ICD-10-CM | POA: Diagnosis not present

## 2018-04-30 DIAGNOSIS — E66812 Obesity, class 2: Secondary | ICD-10-CM

## 2018-04-30 DIAGNOSIS — Z9189 Other specified personal risk factors, not elsewhere classified: Secondary | ICD-10-CM

## 2018-04-30 DIAGNOSIS — E8881 Metabolic syndrome: Secondary | ICD-10-CM

## 2018-04-30 DIAGNOSIS — E559 Vitamin D deficiency, unspecified: Secondary | ICD-10-CM

## 2018-04-30 DIAGNOSIS — E785 Hyperlipidemia, unspecified: Secondary | ICD-10-CM | POA: Diagnosis not present

## 2018-04-30 DIAGNOSIS — E88819 Insulin resistance, unspecified: Secondary | ICD-10-CM

## 2018-04-30 MED ORDER — VITAMIN D (ERGOCALCIFEROL) 1.25 MG (50000 UNIT) PO CAPS: 50000.0000 [IU] | ORAL_CAPSULE | ORAL | 0 refills | Status: DC

## 2018-04-30 MED ORDER — METFORMIN HCL 500 MG PO TABS: 500.0000 mg | ORAL_TABLET | Freq: Every day | ORAL | 0 refills | Status: DC

## 2018-04-30 MED FILL — metFORMIN HCL 500 MG TABS: 500 | 30 days supply | Qty: 30 | Fill #0

## 2018-05-01 LAB — HEMOGLOBIN A1C
Est. average glucose Bld gHb Est-mCnc: 108 mg/dL
Hgb A1c MFr Bld: 5.4 % (ref 4.8–5.6)

## 2018-05-01 LAB — COMPREHENSIVE METABOLIC PANEL
ALBUMIN: 4.6 g/dL (ref 3.5–5.5)
ALT: 21 IU/L (ref 0–32)
AST: 18 IU/L (ref 0–40)
Albumin/Globulin Ratio: 1.8 (ref 1.2–2.2)
Alkaline Phosphatase: 139 IU/L — ABNORMAL HIGH (ref 39–117)
BUN / CREAT RATIO: 30 — AB (ref 9–23)
BUN: 18 mg/dL (ref 6–24)
Bilirubin Total: 0.2 mg/dL (ref 0.0–1.2)
CALCIUM: 9.7 mg/dL (ref 8.7–10.2)
CO2: 25 mmol/L (ref 20–29)
CREATININE: 0.6 mg/dL (ref 0.57–1.00)
Chloride: 102 mmol/L (ref 96–106)
GFR, EST AFRICAN AMERICAN: 121 mL/min/{1.73_m2} (ref >59)
GFR, EST NON AFRICAN AMERICAN: 105 mL/min/{1.73_m2} (ref >59)
GLUCOSE: 83 mg/dL (ref 65–99)
Globulin, Total: 2.5 g/dL (ref 1.5–4.5)
Potassium: 4.6 mmol/L (ref 3.5–5.2)
Sodium: 141 mmol/L (ref 134–144)
TOTAL PROTEIN: 7.1 g/dL (ref 6.0–8.5)

## 2018-05-01 LAB — LIPID PANEL
CHOLESTEROL TOTAL: 160 mg/dL (ref 100–199)
Chol/HDL Ratio: 3.3 ratio (ref 0.0–4.4)
HDL: 49 mg/dL (ref >39)
LDL CALC: 84 mg/dL (ref 0–99)
TRIGLYCERIDES: 134 mg/dL (ref 0–149)
VLDL CHOLESTEROL CAL: 27 mg/dL (ref 5–40)

## 2018-05-01 LAB — INSULIN, RANDOM: INSULIN: 8.7 u[IU]/mL (ref 2.6–24.9)

## 2018-05-01 LAB — VITAMIN D 25 HYDROXY (VIT D DEFICIENCY, FRACTURES): Vit D, 25-Hydroxy: 62.7 ng/mL (ref 30.0–100.0)

## 2018-05-04 NOTE — Progress Notes (Signed)
Office: [phone]  /  Fax: [phone]   HPI:   Chief Complaint: OBESITY Hailey Noble is here to discuss her progress with her obesity treatment plan. She is on the Category 2 plan and is following her eating plan approximately 95 % of the time. She states she is doing yard work, swimming, and elliptical for 60 minutes 3 times per week. Hailey Noble continues to do well with weight loss. She is following the meal plan closely and states hunger is well controlled.  Her weight is 211 lb (95.7 kg) today and has had a weight loss of 3 pounds over a period of 2 weeks since her last visit. She has lost 1 lb since starting treatment with [name].  Hyperlipidemia Hailey Noble has hyperlipidemia and has been trying to improve her cholesterol levels with intensive lifestyle modification including a low saturated fat diet, exercise and weight loss. She is not on any medications and declines, last labs within normal limits. She denies any chest pain, claudication or myalgias.  At risk for cardiovascular disease Hailey Noble is at a higher than average risk for cardiovascular disease due to obesity and hyperlipidemia. She currently denies any chest pain.  Vitamin D Deficiency Hailey Noble has a diagnosis of vitamin D deficiency. She is currently taking prescription Vit D and denies nausea, vomiting or muscle weakness.  Insulin Resistance Hailey Noble has a diagnosis of insulin resistance based on her elevated fasting insulin level >5. Although Hailey Noble's blood glucose readings are still under good control, insulin resistance puts her at greater risk of metabolic syndrome and diabetes. She is taking metformin currently and continues to work on diet and exercise to decrease risk of diabetes. She denies polyphagia.  ALLERGIES: Allergies  Allergen Reactions  . Coconut Fatty Acids Hives  . Codeine   . Hydrocod Polst-Cpm Polst Er     REACTION: rash on face and head, itching  . Mango Flavor Hives  . [name] Other (See Comments)  .  Oxycodone-Acetaminophen     REACTION: rash on face and head, itching  . Tussin Cough [Dextromethorphan Hbr]     Broken out in hives  . Ultram [Tramadol] Other (See Comments)    Dizzy and low blood pressure.     MEDICATIONS: Current Outpatient Medications on File Prior to Visit  Medication Sig Dispense Refill  . acetaminophen (TYLENOL) 650 MG CR tablet Take 650 mg by mouth every 8 (eight) hours as needed. pain    . Calcium Carbonate-Vit D-Min (CALCIUM 1200 PO) Take by mouth daily.      . cetirizine (ZYRTEC) 10 MG tablet Take 10 mg by mouth daily.    . Cholecalciferol (VITAMIN D-1000 MAX ST PO) Take 1 tablet by mouth.    . escitalopram (LEXAPRO) 10 MG tablet Take 1 tablet (10 mg total) by mouth daily. 90 tablet 3  . famotidine (PEPCID) 20 MG tablet Take 20 mg daily as needed by mouth.     Marland Kitchen KRILL OIL 1000 MG CAPS Take 1 capsule by mouth daily.    . mometasone (NASONEX) 50 MCG/ACT nasal spray Place 2 sprays into the nose daily. 17 g 1  . Polyethyl Glycol-Propyl Glycol (SYSTANE FREE OP) Apply 1 drop to eye daily.     No current facility-administered medications on file prior to visit.     PAST MEDICAL HISTORY: Past Medical History:  Diagnosis Date  . Allergic state 12/08/2016  . Allergy   . Anxiety   . Back pain   . GERD (gastroesophageal reflux disease)   . Hyperlipidemia 06/09/2017  .  Joint pain   . Knee pain, right 03/10/2017  . Plantar fasciitis   . Shortness of breath   . Sinusitis   . Vitamin D deficiency 11/22/2016  . Vitamin D deficiency     PAST SURGICAL HISTORY: Past Surgical History:  Procedure Laterality Date  . COLONOSCOPY  02-2009   with Henrene Pastor normal  . ESOPHAGOGASTRODUODENOSCOPY  2012  . GANGLION CYST EXCISION Right    right- wrist   . NASAL SEPTUM SURGERY    . NASAL SINUS SURGERY    . TMJ ARTHROSCOPY     left    SOCIAL HISTORY: Social History   Tobacco Use  . Smoking status: Never Smoker  . Smokeless tobacco: Never Used  Substance Use Topics  .  Alcohol use: Yes    Alcohol/week: 0.0 oz    Comment: rare  . Drug use: No    FAMILY HISTORY: Family History  Problem Relation Age of Onset  . Hypertension Father   . Diabetes Father   . Hyperlipidemia Father   . Breast cancer Mother 52  . Cancer Mother        breast  . Thyroid disease Mother   . Sleep apnea Mother   . Colon cancer Paternal Grandfather        died at age 55  . Cancer Paternal Grandfather        GI CA  . Obesity Sister   . Diabetes Sister   . Hypertension Sister   . Heart disease Maternal Grandmother        MI  . Cancer Maternal Grandfather        lung, smoker  . Diabetes Maternal Grandfather   . Diabetes Paternal Grandmother   . Heart disease Paternal Grandmother   . Pancreatitis Sister   . Esophageal cancer Neg Hx   . Stomach cancer Neg Hx   . Stroke Neg Hx   . Colon polyps Neg Hx   . Rectal cancer Neg Hx   . Pancreatic cancer Neg Hx     ROS: Review of Systems  Constitutional: Positive for weight loss.  Cardiovascular: Negative for chest pain and claudication.  Gastrointestinal: Negative for nausea and vomiting.  Musculoskeletal: Negative for myalgias.       Negative muscle weakness  Endo/Heme/Allergies:       Negative polyphagia    PHYSICAL EXAM: Blood pressure 119/76, pulse 77, temperature 98.4 F (36.9 C), temperature source Oral, height 5\' 5"  (1.651 m), weight 211 lb (95.7 kg), SpO2 98 %. Body mass index is 35.11 kg/m. Physical Exam  Constitutional: She is oriented to person, place, and time. She appears well-developed and well-nourished.  Cardiovascular: Normal rate.  Pulmonary/Chest: Effort normal.  Musculoskeletal: Normal range of motion.  Neurological: She is oriented to person, place, and time.  Skin: Skin is warm and dry.  Psychiatric: She has a normal mood and affect. Her behavior is normal.  Vitals reviewed.   RECENT LABS AND TESTS: BMET    Component Value Date/Time   NA 141 04/30/2018 1042   K 4.6 04/30/2018 1042    CL 102 04/30/2018 1042   CO2 25 04/30/2018 1042   GLUCOSE 83 04/30/2018 1042   GLUCOSE 85 11/22/2016 1521   GLUCOSE 86 11/13/2009   BUN 18 04/30/2018 1042   CREATININE 0.60 04/30/2018 1042   CREATININE 0.67 11/22/2016 1521   CALCIUM 9.7 04/30/2018 1042   GFRNONAA 105 04/30/2018 1042   GFRAA 121 04/30/2018 1042   Lab Results  Component Value Date   HGBA1C  5.4 04/30/2018   HGBA1C 5.2 10/06/2017   HGBA1C 5.1 04/06/2014   Lab Results  Component Value Date   INSULIN 8.7 04/30/2018   INSULIN 8.8 10/06/2017   CBC    Component Value Date/Time   WBC 9.6 10/06/2017 0850   WBC 9.8 11/22/2016 1521   RBC 4.71 10/06/2017 0850   RBC 4.41 11/22/2016 1521   HGB 13.8 10/06/2017 0850   HCT 42.4 10/06/2017 0850   PLT 328 11/22/2016 1521   PLT 381 11/13/2009   MCV 90 10/06/2017 0850   MCH 29.3 10/06/2017 0850   MCH 29.7 11/22/2016 1521   MCHC 32.5 10/06/2017 0850   MCHC 32.2 11/22/2016 1521   RDW 13.5 10/06/2017 0850   LYMPHSABS 1.8 10/06/2017 0850   MONOABS 0.5 05/26/2015 0931   EOSABS 0.1 10/06/2017 0850   BASOSABS 0.1 10/06/2017 0850   Iron/TIBC/Ferritin/ %Sat    Component Value Date/Time   FERRITIN 50 11/09/2008   Lipid Panel     Component Value Date/Time   CHOL 160 04/30/2018 1042   TRIG 134 04/30/2018 1042   TRIG 138 11/13/2009 1337   HDL 49 04/30/2018 1042   CHOLHDL 3.3 04/30/2018 1042   CHOLHDL 3.9 11/22/2016 1521   VLDL 32 (H) 11/22/2016 1521   LDLCALC 84 04/30/2018 1042   Hepatic Function Panel     Component Value Date/Time   PROT 7.1 04/30/2018 1042   ALBUMIN 4.6 04/30/2018 1042   AST 18 04/30/2018 1042   ALT 21 04/30/2018 1042   ALKPHOS 139 (H) 04/30/2018 1042   BILITOT 0.2 04/30/2018 1042   BILIDIR 0.1 05/26/2015 0931      Component Value Date/Time   TSH 1.690 10/06/2017 0850   TSH 1.30 11/22/2016 1521   TSH 1.67 05/26/2015 0931  Results for CAMPArien, Morine I (MRN [id number]) as of 05/04/2018 15:07  Ref. Range 10/06/2017 08:50  Vitamin D, 25-Hydroxy  Latest Ref Range: 30.0 - 100.0 ng/mL 36.9    ASSESSMENT AND PLAN: Hyperlipidemia, unspecified hyperlipidemia type - Plan: Comprehensive metabolic panel, Lipid panel  Vitamin D deficiency - Plan: VITAMIN D 25 Hydroxy (Vit-D Deficiency, Fractures), Vitamin D, Ergocalciferol, (DRISDOL) 50000 units CAPS capsule  Insulin resistance - Plan: Hemoglobin A1c, Insulin, random, metFORMIN (GLUCOPHAGE) 500 MG tablet  At risk for heart disease  Class 2 severe obesity with serious comorbidity and body mass index (BMI) of 35.0 to 35.9 in adult, unspecified obesity type (HCC)  PLAN:  Hyperlipidemia Jhade was informed of the American Heart Association Guidelines emphasizing intensive lifestyle modifications as the first line treatment for hyperlipidemia. We discussed many lifestyle modifications today in depth, and Hailey Noble will continue to work on decreasing saturated fats such as fatty red meat, butter and many fried foods. She will also increase vegetables and lean protein in her diet and continue to work on diet, exercise, and weight loss efforts. We will check labs and Hailey Noble agrees to follow up with our clinic in 2 weeks.  Cardiovascular risk counselling Hailey Noble was given extended (15 minutes) coronary artery disease prevention counseling today. She is 52 y.o. female and has risk factors for heart disease including obesity and hyperlipidemia. We discussed intensive lifestyle modifications today with an emphasis on specific weight loss instructions and strategies. Pt was also informed of the importance of increasing exercise and decreasing saturated fats to help prevent heart disease.  Vitamin D Deficiency Hailey Noble was informed that low vitamin D levels contributes to fatigue and are associated with obesity, breast, and colon cancer. Hailey Noble agrees to continue taking prescription  Vit D @50 ,000 IU every week #4 and we will refill for 1 month. She will follow up for routine testing of vitamin D, at least 2-3  times per year. She was informed of the risk of over-replacement of vitamin D and agrees to not increase her dose unless she discusses this with [name] first. Hailey Noble agrees to follow up with our clinic in 2 weeks.  Insulin Resistance Darlin will continue to work on weight loss, exercise, and decreasing simple carbohydrates in her diet to help decrease the risk of diabetes. We dicussed metformin including benefits and risks. She was informed that eating too many simple carbohydrates or too many calories at one sitting increases the likelihood of GI side effects. Hailey Noble agrees to continue taking metformin 500 mg q AM #30 and we will refill for 1 month. Hailey Noble agrees to follow up with our clinic in 2 weeks as directed to monitor her progress.  Obesity Hailey Noble is currently in the action stage of change. As such, her goal is to continue with weight loss efforts She has agreed to follow the Category 2 plan Hailey Noble has been instructed to work up to a goal of 150 minutes of combined cardio and strengthening exercise per week for weight loss and overall health benefits. We discussed the following Behavioral Modification Strategies today: increasing lean protein intake and work on meal planning and easy cooking plans   Hailey Noble has agreed to follow up with our clinic in 2 weeks. She was informed of the importance of frequent follow up visits to maximize her success with intensive lifestyle modifications for her multiple health conditions.   OBESITY BEHAVIORAL INTERVENTION VISIT  Today's visit was # 12 out of 22.  Starting weight: 212 lbs Starting date: 10/06/17 Today's weight : 211 lbs Today's date: 04/30/2018 Total lbs lost to date: 1 (Patients must lose 7 lbs in the first 6 months to continue with counseling)   ASK: We discussed the diagnosis of obesity with Hailey Noble today and Hailey Noble agreed to give [name] permission to discuss obesity behavioral modification therapy today.  ASSESS: Hailey Noble has the  diagnosis of obesity and her BMI today is 35.11 Hailey Noble is in the action stage of change   ADVISE: Hailey Noble was educated on the multiple health risks of obesity as well as the benefit of weight loss to improve her health. She was advised of the need for long term treatment and the importance of lifestyle modifications.  AGREE: Multiple dietary modification options and treatment options were discussed and  Hailey Noble agreed to the above obesity treatment plan.   Wilhemena Durie, am acting as transcriptionist for Lacy Duverney, PA-C I, Lacy Duverney [facility], have reviewed this note and agree with its content

## 2018-05-20 ENCOUNTER — Ambulatory Visit (INDEPENDENT_AMBULATORY_CARE_PROVIDER_SITE_OTHER): Payer: 59 | Admitting: Physician Assistant

## 2018-05-20 VITALS — BP 122/76 | HR 79 | Temp 97.1°F | Ht 65.0 in | Wt 211.0 lb

## 2018-05-20 DIAGNOSIS — Z6835 Body mass index (BMI) 35.0-35.9, adult: Secondary | ICD-10-CM

## 2018-05-20 DIAGNOSIS — E559 Vitamin D deficiency, unspecified: Secondary | ICD-10-CM

## 2018-05-20 DIAGNOSIS — Z9189 Other specified personal risk factors, not elsewhere classified: Secondary | ICD-10-CM

## 2018-05-20 DIAGNOSIS — E8881 Metabolic syndrome: Secondary | ICD-10-CM | POA: Diagnosis not present

## 2018-05-20 MED ORDER — METFORMIN HCL 500 MG PO TABS: 500.0000 mg | ORAL_TABLET | Freq: Every day | ORAL | 0 refills | Status: DC

## 2018-05-20 MED ORDER — VITAMIN D (ERGOCALCIFEROL) 1.25 MG (50000 UNIT) PO CAPS: 50000.0000 [IU] | ORAL_CAPSULE | ORAL | 0 refills | Status: DC

## 2018-05-20 MED FILL — VIT D2 1.25 MG (50,000 UNIT: 1.25 MG | 28 days supply | Qty: 2 | Fill #0

## 2018-05-20 NOTE — Progress Notes (Signed)
Office: [phone]  /  Fax: [phone]   HPI:   Chief Complaint: OBESITY Hailey Noble is here to discuss her progress with her obesity treatment plan. She is on the Category 2 plan and is following her eating plan approximately 95 % of the time. She states she is doing yard work, spin class, and swimming for 60 minutes 4 times per week. Hailey Noble maintained her weight. She had increase celebration eating but made better food choices. She would like a different meal plan.  Her weight is 211 lb (95.7 kg) today and has not lost weight since her last visit. She has lost 1 lbs since starting treatment with [name].  Vitamin D Deficiency Hailey Noble has a diagnosis of vitamin D deficiency. She is currently taking prescription Vit D, Vit D improved and at goal. She denies nausea, vomiting or muscle weakness.  Insulin Resistance Hailey Noble has a diagnosis of insulin resistance based on her elevated fasting insulin level >5. Although Hailey Noble's blood glucose readings are still under good control, insulin resistance puts her at greater risk of metabolic syndrome and diabetes. She is taking metformin currently and continues to work on diet and exercise to decrease risk of diabetes. She denies polyphagia.  At risk for diabetes Hailey Noble is at higher than average risk for developing diabetes due to her obesity and insulin resistance. She currently denies polyuria or polydipsia.  ALLERGIES: Allergies  Allergen Reactions  . Coconut Fatty Acids Hives  . Codeine   . Hydrocod Polst-Cpm Polst Er     REACTION: rash on face and head, itching  . Mango Flavor Hives  . [name] Other (See Comments)  . Oxycodone-Acetaminophen     REACTION: rash on face and head, itching  . Tussin Cough [Dextromethorphan Hbr]     Broken out in hives  . Ultram [Tramadol] Other (See Comments)    Dizzy and low blood pressure.     MEDICATIONS: Current Outpatient Medications on File Prior to Visit  Medication Sig Dispense Refill  .  acetaminophen (TYLENOL) 650 MG CR tablet Take 650 mg by mouth every 8 (eight) hours as needed. pain    . Calcium Carbonate-Vit D-Min (CALCIUM 1200 PO) Take by mouth daily.      . cetirizine (ZYRTEC) 10 MG tablet Take 10 mg by mouth daily.    . Cholecalciferol (VITAMIN D-1000 MAX ST PO) Take 1 tablet by mouth.    . escitalopram (LEXAPRO) 10 MG tablet Take 1 tablet (10 mg total) by mouth daily. 90 tablet 3  . famotidine (PEPCID) 20 MG tablet Take 20 mg daily as needed by mouth.     Marland Kitchen KRILL OIL 1000 MG CAPS Take 1 capsule by mouth daily.    . metFORMIN (GLUCOPHAGE) 500 MG tablet Take 1 tablet (500 mg total) by mouth daily with breakfast. 30 tablet 0  . mometasone (NASONEX) 50 MCG/ACT nasal spray Place 2 sprays into the nose daily. 17 g 1  . Polyethyl Glycol-Propyl Glycol (SYSTANE FREE OP) Apply 1 drop to eye daily.    . Vitamin D, Ergocalciferol, (DRISDOL) 50000 units CAPS capsule Take 1 capsule (50,000 Units total) by mouth every 7 (seven) days. 4 capsule 0   No current facility-administered medications on file prior to visit.     PAST MEDICAL HISTORY: Past Medical History:  Diagnosis Date  . Allergic state 12/08/2016  . Allergy   . Anxiety   . Back pain   . GERD (gastroesophageal reflux disease)   . Hyperlipidemia 06/09/2017  . Joint pain   .  Knee pain, right 03/10/2017  . Plantar fasciitis   . Shortness of breath   . Sinusitis   . Vitamin D deficiency 11/22/2016  . Vitamin D deficiency     PAST SURGICAL HISTORY: Past Surgical History:  Procedure Laterality Date  . COLONOSCOPY  02-2009   with Henrene Pastor normal  . ESOPHAGOGASTRODUODENOSCOPY  2012  . GANGLION CYST EXCISION Right    right- wrist   . NASAL SEPTUM SURGERY    . NASAL SINUS SURGERY    . TMJ ARTHROSCOPY     left    SOCIAL HISTORY: Social History   Tobacco Use  . Smoking status: Never Smoker  . Smokeless tobacco: Never Used  Substance Use Topics  . Alcohol use: Yes    Alcohol/week: 0.0 oz    Comment: rare  .  Drug use: No    FAMILY HISTORY: Family History  Problem Relation Age of Onset  . Hypertension Father   . Diabetes Father   . Hyperlipidemia Father   . Breast cancer Mother 8  . Cancer Mother        breast  . Thyroid disease Mother   . Sleep apnea Mother   . Colon cancer Paternal Grandfather        died at age 33  . Cancer Paternal Grandfather        GI CA  . Obesity Sister   . Diabetes Sister   . Hypertension Sister   . Heart disease Maternal Grandmother        MI  . Cancer Maternal Grandfather        lung, smoker  . Diabetes Maternal Grandfather   . Diabetes Paternal Grandmother   . Heart disease Paternal Grandmother   . Pancreatitis Sister   . Esophageal cancer Neg Hx   . Stomach cancer Neg Hx   . Stroke Neg Hx   . Colon polyps Neg Hx   . Rectal cancer Neg Hx   . Pancreatic cancer Neg Hx     ROS: Review of Systems  Constitutional: Negative for weight loss.  Gastrointestinal: Negative for nausea and vomiting.  Genitourinary: Negative for frequency.  Musculoskeletal:       Negative muscle weakness  Endo/Heme/Allergies: Negative for polydipsia.       Negative polyphagia    PHYSICAL EXAM: Blood pressure 122/76, pulse 79, temperature (!) 97.1 F (36.2 C), temperature source Oral, height 5\' 5"  (1.651 m), weight 211 lb (95.7 kg), SpO2 95 %. Body mass index is 35.11 kg/m. Physical Exam  Constitutional: She is oriented to person, place, and time. She appears well-developed and well-nourished.  Cardiovascular: Normal rate.  Pulmonary/Chest: Effort normal.  Musculoskeletal: Normal range of motion.  Neurological: She is oriented to person, place, and time.  Skin: Skin is warm and dry.  Psychiatric: She has a normal mood and affect. Her behavior is normal.  Vitals reviewed.   RECENT LABS AND TESTS: BMET    Component Value Date/Time   NA 141 04/30/2018 1042   K 4.6 04/30/2018 1042   CL 102 04/30/2018 1042   CO2 25 04/30/2018 1042   GLUCOSE 83 04/30/2018  1042   GLUCOSE 85 11/22/2016 1521   GLUCOSE 86 11/13/2009   BUN 18 04/30/2018 1042   CREATININE 0.60 04/30/2018 1042   CREATININE 0.67 11/22/2016 1521   CALCIUM 9.7 04/30/2018 1042   GFRNONAA 105 04/30/2018 1042   GFRAA 121 04/30/2018 1042   Lab Results  Component Value Date   HGBA1C 5.4 04/30/2018   HGBA1C 5.2 10/06/2017  HGBA1C 5.1 04/06/2014   Lab Results  Component Value Date   INSULIN 8.7 04/30/2018   INSULIN 8.8 10/06/2017   CBC    Component Value Date/Time   WBC 9.6 10/06/2017 0850   WBC 9.8 11/22/2016 1521   RBC 4.71 10/06/2017 0850   RBC 4.41 11/22/2016 1521   HGB 13.8 10/06/2017 0850   HCT 42.4 10/06/2017 0850   PLT 328 11/22/2016 1521   PLT 381 11/13/2009   MCV 90 10/06/2017 0850   MCH 29.3 10/06/2017 0850   MCH 29.7 11/22/2016 1521   MCHC 32.5 10/06/2017 0850   MCHC 32.2 11/22/2016 1521   RDW 13.5 10/06/2017 0850   LYMPHSABS 1.8 10/06/2017 0850   MONOABS 0.5 05/26/2015 0931   EOSABS 0.1 10/06/2017 0850   BASOSABS 0.1 10/06/2017 0850   Iron/TIBC/Ferritin/ %Sat    Component Value Date/Time   FERRITIN 50 11/09/2008   Lipid Panel     Component Value Date/Time   CHOL 160 04/30/2018 1042   TRIG 134 04/30/2018 1042   TRIG 138 11/13/2009 1337   HDL 49 04/30/2018 1042   CHOLHDL 3.3 04/30/2018 1042   CHOLHDL 3.9 11/22/2016 1521   VLDL 32 (H) 11/22/2016 1521   LDLCALC 84 04/30/2018 1042   Hepatic Function Panel     Component Value Date/Time   PROT 7.1 04/30/2018 1042   ALBUMIN 4.6 04/30/2018 1042   AST 18 04/30/2018 1042   ALT 21 04/30/2018 1042   ALKPHOS 139 (H) 04/30/2018 1042   BILITOT 0.2 04/30/2018 1042   BILIDIR 0.1 05/26/2015 0931      Component Value Date/Time   TSH 1.690 10/06/2017 0850   TSH 1.30 11/22/2016 1521   TSH 1.67 05/26/2015 0931  Results for CAMPTonda, Wiederhold I (MRN [id number]) as of 05/20/2018 08:50  Ref. Range 04/30/2018 10:42  Vitamin D, 25-Hydroxy Latest Ref Range: 30.0 - 100.0 ng/mL 62.7    ASSESSMENT AND  PLAN: Vitamin D deficiency - Plan: Vitamin D, Ergocalciferol, (DRISDOL) 50000 units CAPS capsule  Insulin resistance - Plan: metFORMIN (GLUCOPHAGE) 500 MG tablet  At risk for diabetes mellitus  Class 2 severe obesity with serious comorbidity and body mass index (BMI) of 35.0 to 35.9 in adult, unspecified obesity type (Hailey Noble)  PLAN:  Vitamin D Deficiency Hailey Noble was informed that low vitamin D levels contributes to fatigue and are associated with obesity, breast, and colon cancer. Hailey Noble agrees to continue taking prescription Vit D @50 ,000 IU every other week #2 and we will refill for 1 month. She will follow up for routine testing of vitamin D, at least 2-3 times per year. She was informed of the risk of over-replacement of vitamin D and agrees to not increase her dose unless she discusses this with [name] first. Hailey Noble agrees to follow up with our clinic in 3 weeks.  Insulin Resistance Hailey Noble will continue to work on weight loss, exercise, and decreasing simple carbohydrates in her diet to help decrease the risk of diabetes. We dicussed metformin including benefits and risks. She was informed that eating too many simple carbohydrates or too many calories at one sitting increases the likelihood of GI side effects. Hailey Noble agrees to continue taking metformin 500 mg q AM #30 and we will refill for 1 month. Hailey Noble agrees to follow up with our clinic in 3 weeks as directed to monitor her progress.  Diabetes risk counselling Hailey Noble was given extended (15 minutes) diabetes prevention counseling today. She is 52 y.o. female and has risk factors for diabetes including obesity and insulin  resistance. We discussed intensive lifestyle modifications today with an emphasis on weight loss as well as increasing exercise and decreasing simple carbohydrates in her diet.  Obesity Hailey Noble is currently in the action stage of change. As such, her goal is to continue with weight loss efforts She has agreed to follow a lower  carbohydrate, vegetable and lean protein rich diet plan Tamberlyn has been instructed to work up to a goal of 150 minutes of combined cardio and strengthening exercise per week for weight loss and overall health benefits. We discussed the following Behavioral Modification Strategies today: decreasing simple carbohydrates  and increasing vegetables   Sherhonda has agreed to follow up with our clinic in 3 weeks. She was informed of the importance of frequent follow up visits to maximize her success with intensive lifestyle modifications for her multiple health conditions.   OBESITY BEHAVIORAL INTERVENTION VISIT  Today's visit was # 13 out of 22.  Starting weight: 212 lbs Starting date: 10/06/17 Today's weight : 211 lbs Today's date: 05/20/2018 Total lbs lost to date: 1 (Patients must lose 7 lbs in the first 6 months to continue with counseling)   ASK: We discussed the diagnosis of obesity with Hebert Soho today and Denasia agreed to give [name] permission to discuss obesity behavioral modification therapy today.  ASSESS: Edin has the diagnosis of obesity and her BMI today is 35.11 Jinna is in the action stage of change   ADVISE: [name] was educated on the multiple health risks of obesity as well as the benefit of weight loss to improve her health. She was advised of the need for long term treatment and the importance of lifestyle modifications.  AGREE: Multiple dietary modification options and treatment options were discussed and  Ardyn agreed to the above obesity treatment plan.   Wilhemena Durie, am acting as transcriptionist for Lacy Duverney, PA-C I, Lacy Duverney [facility], have reviewed this note and agree with its content

## 2018-05-29 MED FILL — metFORMIN HCL 500 MG TABS: 500 | 30 days supply | Qty: 30 | Fill #0

## 2018-06-09 ENCOUNTER — Ambulatory Visit (INDEPENDENT_AMBULATORY_CARE_PROVIDER_SITE_OTHER): Payer: 59 | Admitting: Physician Assistant

## 2018-06-09 VITALS — BP 136/87 | HR 80 | Temp 98.4°F | Ht 65.0 in | Wt 208.0 lb

## 2018-06-09 DIAGNOSIS — K5909 Other constipation: Secondary | ICD-10-CM

## 2018-06-09 DIAGNOSIS — Z6834 Body mass index (BMI) 34.0-34.9, adult: Secondary | ICD-10-CM

## 2018-06-09 DIAGNOSIS — E669 Obesity, unspecified: Secondary | ICD-10-CM

## 2018-06-09 DIAGNOSIS — E559 Vitamin D deficiency, unspecified: Secondary | ICD-10-CM

## 2018-06-09 DIAGNOSIS — Z9189 Other specified personal risk factors, not elsewhere classified: Secondary | ICD-10-CM

## 2018-06-09 DIAGNOSIS — E8881 Metabolic syndrome: Secondary | ICD-10-CM | POA: Diagnosis not present

## 2018-06-09 MED ORDER — VITAMIN D (ERGOCALCIFEROL) 1.25 MG (50000 UNIT) PO CAPS: 50000.0000 [IU] | ORAL_CAPSULE | ORAL | 0 refills | Status: DC

## 2018-06-09 NOTE — Progress Notes (Addendum)
Office: [phone]  /  Fax: [phone]   HPI:   Chief Complaint: OBESITY Hailey Noble is here to discuss her progress with her obesity treatment plan. She is on the lower carbohydrate, vegetable and lean protein rich diet plan and is following her eating plan approximately 95 % of the time. She states she is walking for 30 minutes 3 times per week. Hailey Noble continues to do well with weight loss. She tolerated low carbohydrate well but would like to incorporate more variety into her meals.  Her weight is 208 lb (94.3 kg) today and has had a weight loss of 3 pounds over a period of 3 weeks since her last visit. She has lost 4 lbs since starting treatment with [name].  Vitamin D Deficiency Hailey Noble has a diagnosis of vitamin D deficiency. She is currently taking prescription Vit D and denies nausea, vomiting or muscle weakness.  At risk for osteopenia and osteoporosis Hailey Noble is at higher risk of osteopenia and osteoporosis due to vitamin D deficiency.   Insulin Resistance Hailey Noble has a diagnosis of insulin resistance based on her elevated fasting insulin level >5. Although Hailey Noble's blood glucose readings are still under good control, insulin resistance puts her at greater risk of metabolic syndrome and diabetes. She is taking metformin currently and continues to work on diet and exercise to decrease risk of diabetes. She denies polyphagia.  Constipation Hailey Noble notes constipation for the last few weeks, worse since attempting weight loss. She states BM are less frequent and are not hard and painful. She denies hematochezia or melena. She admits to drinking less H20 recently.  ALLERGIES: Allergies  Allergen Reactions  . Coconut Fatty Acids Hives  . Codeine   . Hydrocod Polst-Cpm Polst Er     REACTION: rash on face and head, itching  . Mango Flavor Hives  . [name] Other (See Comments)  . Oxycodone-Acetaminophen     REACTION: rash on face and head, itching  . Tussin Cough [Dextromethorphan  Hbr]     Broken out in hives  . Ultram [Tramadol] Other (See Comments)    Dizzy and low blood pressure.     MEDICATIONS: Current Outpatient Medications on File Prior to Visit  Medication Sig Dispense Refill  . acetaminophen (TYLENOL) 650 MG CR tablet Take 650 mg by mouth every 8 (eight) hours as needed. pain    . Calcium Carbonate-Vit D-Min (CALCIUM 1200 PO) Take by mouth daily.      . cetirizine (ZYRTEC) 10 MG tablet Take 10 mg by mouth daily.    . Cholecalciferol (VITAMIN D-1000 MAX ST PO) Take 1 tablet by mouth.    . escitalopram (LEXAPRO) 10 MG tablet Take 1 tablet (10 mg total) by mouth daily. 90 tablet 3  . famotidine (PEPCID) 20 MG tablet Take 20 mg daily as needed by mouth.     Hailey Noble Kitchen KRILL OIL 1000 MG CAPS Take 1 capsule by mouth daily.    . metFORMIN (GLUCOPHAGE) 500 MG tablet Take 1 tablet (500 mg total) by mouth daily with breakfast. 30 tablet 0  . mometasone (NASONEX) 50 MCG/ACT nasal spray Place 2 sprays into the nose daily. 17 g 1  . Polyethyl Glycol-Propyl Glycol (SYSTANE FREE OP) Apply 1 drop to eye daily.     No current facility-administered medications on file prior to visit.     PAST MEDICAL HISTORY: Past Medical History:  Diagnosis Date  . Allergic state 12/08/2016  . Allergy   . Anxiety   . Back pain   . GERD (  gastroesophageal reflux disease)   . Hyperlipidemia 06/09/2017  . Joint pain   . Knee pain, right 03/10/2017  . Plantar fasciitis   . Shortness of breath   . Sinusitis   . Vitamin D deficiency 11/22/2016  . Vitamin D deficiency     PAST SURGICAL HISTORY: Past Surgical History:  Procedure Laterality Date  . COLONOSCOPY  02-2009   with Henrene Pastor normal  . ESOPHAGOGASTRODUODENOSCOPY  2012  . GANGLION CYST EXCISION Right    right- wrist   . NASAL SEPTUM SURGERY    . NASAL SINUS SURGERY    . TMJ ARTHROSCOPY     left    SOCIAL HISTORY: Social History   Tobacco Use  . Smoking status: Never Smoker  . Smokeless tobacco: Never Used  Substance Use  Topics  . Alcohol use: Yes    Alcohol/week: 0.0 oz    Comment: rare  . Drug use: No    FAMILY HISTORY: Family History  Problem Relation Age of Onset  . Hypertension Father   . Diabetes Father   . Hyperlipidemia Father   . Breast cancer Mother 80  . Cancer Mother        breast  . Thyroid disease Mother   . Sleep apnea Mother   . Colon cancer Paternal Grandfather        died at age 2  . Cancer Paternal Grandfather        GI CA  . Obesity Sister   . Diabetes Sister   . Hypertension Sister   . Heart disease Maternal Grandmother        MI  . Cancer Maternal Grandfather        lung, smoker  . Diabetes Maternal Grandfather   . Diabetes Paternal Grandmother   . Heart disease Paternal Grandmother   . Pancreatitis Sister   . Esophageal cancer Neg Hx   . Stomach cancer Neg Hx   . Stroke Neg Hx   . Colon polyps Neg Hx   . Rectal cancer Neg Hx   . Pancreatic cancer Neg Hx     ROS: Review of Systems  Constitutional: Positive for weight loss.  Gastrointestinal: Positive for constipation. Negative for melena, nausea and vomiting.       Negative hematochezia  Musculoskeletal:       Negative muscle weakness  Endo/Heme/Allergies:       Negative polyphagia    PHYSICAL EXAM: Blood pressure 136/87, pulse 80, temperature 98.4 F (36.9 C), height 5\' 5"  (1.651 m), weight 208 lb (94.3 kg), SpO2 95 %. Body mass index is 34.61 kg/m. Physical Exam  Constitutional: She is oriented to person, place, and time. She appears well-developed and well-nourished.  Cardiovascular: Normal rate.  Pulmonary/Chest: Effort normal.  Musculoskeletal: Normal range of motion.  Neurological: She is oriented to person, place, and time.  Skin: Skin is warm and dry.  Psychiatric: She has a normal mood and affect. Her behavior is normal.  Vitals reviewed.   RECENT LABS AND TESTS: BMET    Component Value Date/Time   NA 141 04/30/2018 1042   K 4.6 04/30/2018 1042   CL 102 04/30/2018 1042   CO2 25  04/30/2018 1042   GLUCOSE 83 04/30/2018 1042   GLUCOSE 85 11/22/2016 1521   GLUCOSE 86 11/13/2009   BUN 18 04/30/2018 1042   CREATININE 0.60 04/30/2018 1042   CREATININE 0.67 11/22/2016 1521   CALCIUM 9.7 04/30/2018 1042   GFRNONAA 105 04/30/2018 1042   GFRAA 121 04/30/2018 1042   Lab  Results  Component Value Date   HGBA1C 5.4 04/30/2018   HGBA1C 5.2 10/06/2017   HGBA1C 5.1 04/06/2014   Lab Results  Component Value Date   INSULIN 8.7 04/30/2018   INSULIN 8.8 10/06/2017   CBC    Component Value Date/Time   WBC 9.6 10/06/2017 0850   WBC 9.8 11/22/2016 1521   RBC 4.71 10/06/2017 0850   RBC 4.41 11/22/2016 1521   HGB 13.8 10/06/2017 0850   HCT 42.4 10/06/2017 0850   PLT 328 11/22/2016 1521   PLT 381 11/13/2009   MCV 90 10/06/2017 0850   MCH 29.3 10/06/2017 0850   MCH 29.7 11/22/2016 1521   MCHC 32.5 10/06/2017 0850   MCHC 32.2 11/22/2016 1521   RDW 13.5 10/06/2017 0850   LYMPHSABS 1.8 10/06/2017 0850   MONOABS 0.5 05/26/2015 0931   EOSABS 0.1 10/06/2017 0850   BASOSABS 0.1 10/06/2017 0850   Iron/TIBC/Ferritin/ %Sat    Component Value Date/Time   FERRITIN 50 11/09/2008   Lipid Panel     Component Value Date/Time   CHOL 160 04/30/2018 1042   TRIG 134 04/30/2018 1042   TRIG 138 11/13/2009 1337   HDL 49 04/30/2018 1042   CHOLHDL 3.3 04/30/2018 1042   CHOLHDL 3.9 11/22/2016 1521   VLDL 32 (H) 11/22/2016 1521   LDLCALC 84 04/30/2018 1042   Hepatic Function Panel     Component Value Date/Time   PROT 7.1 04/30/2018 1042   ALBUMIN 4.6 04/30/2018 1042   AST 18 04/30/2018 1042   ALT 21 04/30/2018 1042   ALKPHOS 139 (H) 04/30/2018 1042   BILITOT 0.2 04/30/2018 1042   BILIDIR 0.1 05/26/2015 0931      Component Value Date/Time   TSH 1.690 10/06/2017 0850   TSH 1.30 11/22/2016 1521   TSH 1.67 05/26/2015 0931  Results for CAMPShekia, Kuper I (MRN [id number]) as of 06/09/2018 10:40  Ref. Range 04/30/2018 10:42  Vitamin D, 25-Hydroxy Latest Ref Range: 30.0 - 100.0  ng/mL 62.7    ASSESSMENT AND PLAN: Vitamin D deficiency - Plan: Vitamin D, Ergocalciferol, (DRISDOL) 50000 units CAPS capsule  Insulin resistance  Other constipation  At risk for osteoporosis  Class 1 obesity with serious comorbidity and body mass index (BMI) of 34.0 to 34.9 in adult, unspecified obesity type  PLAN:  Vitamin D Deficiency Hailey Noble was informed that low vitamin D levels contributes to fatigue and are associated with obesity, breast, and colon cancer. Hailey Noble agrees to continue taking prescription Vit D @50 ,000 IU every 14 days #2 and we will refill for 1 month. She will follow up for routine testing of vitamin D, at least 2-3 times per year. She was informed of the risk of over-replacement of vitamin D and agrees to not increase her dose unless she discusses this with [name] first. Rai agrees to follow up with our clinic in 2 weeks.  At risk for osteopenia and osteoporosis Hailey Noble is at risk for osteopenia and osteoporsis due to her vitamin D deficiency. She was encouraged to take her vitamin D and follow her higher calcium diet and increase strengthening exercise to help strengthen her bones and decrease her risk of osteopenia and osteoporosis.  Insulin Resistance Hailey Noble will continue to work on weight loss, exercise, and decreasing simple carbohydrates in her diet to help decrease the risk of diabetes. We dicussed metformin including benefits and risks. She was informed that eating too many simple carbohydrates or too many calories at one sitting increases the likelihood of GI side effects. Hailey Noble agrees  to continue taking metformin and she agrees to follow up with our clinic in 2 weeks as directed to monitor her progress.  Constipation Hailey Noble was informed decrease bowel movement frequency is normal while losing weight, but stools should not be hard or painful. She was advised to increase her H20 intake and work on increasing her fiber intake. High fiber foods were discussed  today. Due to allergy to Miralax, Lerline agrees to take an OTC fiber supplement such as Metamucil PRN.  Hailey Noble agrees to follow up with our clinic in 2 weeks.  Obesity Hailey Noble is currently in the action stage of change. As such, her goal is to continue with weight loss efforts She has agreed to keep a food journal with 1200 calories and 85 grams of protein daily Hailey Noble has been instructed to work up to a goal of 150 minutes of combined cardio and strengthening exercise per week for weight loss and overall health benefits. We discussed the following Behavioral Modification Strategies today: decreasing simple carbohydrates, increasing fiber rich foods, and increase H20 intake   Hailey Noble has agreed to follow up with our clinic in 2 weeks. She was informed of the importance of frequent follow up visits to maximize her success with intensive lifestyle modifications for her multiple health conditions.   OBESITY BEHAVIORAL INTERVENTION VISIT  Today's visit was # 14 out of 22.  Starting weight: 212 lbs Starting date: 10/06/17 Today's weight : 208 lbs  Today's date: 06/10/2018 Total lbs lost to date: 4 (Patients must lose 7 lbs in the first 6 months to continue with counseling)   ASK: We discussed the diagnosis of obesity with Hailey Noble today and Hailey Noble agreed to give [name] permission to discuss obesity behavioral modification therapy today.  ASSESS: Hailey Noble has the diagnosis of obesity and her BMI today is 34.61 Hailey Noble is in the action stage of change   ADVISE: Hailey Noble was educated on the multiple health risks of obesity as well as the benefit of weight loss to improve her health. She was advised of the need for long term treatment and the importance of lifestyle modifications.  AGREE: Multiple dietary modification options and treatment options were discussed and  Hailey Noble agreed to the above obesity treatment plan.   Hailey Durie, am acting as transcriptionist for Lacy Duverney, PA-C I,  Lacy Duverney [facility], have reviewed this note and agree with its content

## 2018-06-11 MED FILL — VIT D2 1.25 MG (50,000 UNIT: 1.25 MG | 28 days supply | Qty: 2 | Fill #0

## 2018-06-16 ENCOUNTER — Telehealth: Payer: 59 | Admitting: Family

## 2018-06-16 ENCOUNTER — Encounter: Payer: 59 | Admitting: Family

## 2018-06-16 DIAGNOSIS — L0231 Cutaneous abscess of buttock: Secondary | ICD-10-CM

## 2018-06-16 DIAGNOSIS — L03317 Cellulitis of buttock: Secondary | ICD-10-CM

## 2018-06-16 MED ORDER — DOXYCYCLINE HYCLATE 100 MG PO TABS: 100.0000 mg | ORAL_TABLET | Freq: Two times a day (BID) | ORAL | 0 refills | Status: DC

## 2018-06-16 MED FILL — DOXYCYCLINE HYCLATE 100 MG: 100 | 10 days supply | Qty: 20 | Fill #0

## 2018-06-16 NOTE — Progress Notes (Signed)
duplicate

## 2018-06-16 NOTE — Progress Notes (Signed)
E Visit for Rash  We are sorry that you are not feeling well. Here is how we plan to help!  It appears you may have a cellulitis.   Doxycycline 100 mg twice per day for 7 days     HOME CARE:   Take cool showers and avoid direct sunlight.  Apply cool compress or wet dressings.  Take a bath in an oatmeal bath.  Sprinkle content of one Aveeno packet under running faucet with comfortably warm water.  Bathe for 15-20 minutes, 1-2 times daily.  Pat dry with a towel. Do not rub the rash.  Use hydrocortisone cream.  Take an antihistamine like Benadryl for widespread rashes that itch.  The adult dose of Benadryl is 25-50 mg by mouth 4 times daily.  Caution:  This type of medication may cause sleepiness.  Do not drink alcohol, drive, or operate dangerous machinery while taking antihistamines.  Do not take these medications if you have prostate enlargement.  Read package instructions thoroughly on all medications that you take.  GET HELP RIGHT AWAY IF:   Symptoms don't go away after treatment.  Severe itching that persists.  If you rash spreads or swells.  If you rash begins to smell.  If it blisters and opens or develops a yellow-brown crust.  You develop a fever.  You have a sore throat.  You become short of breath.  MAKE SURE YOU:  Understand these instructions. Will watch your condition. Will get help right away if you are not doing well or get worse.  Thank you for choosing an e-visit. Your e-visit answers were reviewed by a board certified advanced clinical practitioner to complete your personal care plan. Depending upon the condition, your plan could have included both over the counter or prescription medications. Please review your pharmacy choice. Be sure that the pharmacy you have chosen is open so that you can pick up your prescription now.  If there is a problem you may message your provider in [place] to have the prescription routed to another pharmacy. Your  safety is important to [name]. If you have drug allergies check your prescription carefully.  For the next 24 hours, you can use MyChart to ask questions about today's visit, request a non-urgent call back, or ask for a work or school excuse from your e-visit provider. You will get an email in the next two days asking about your experience. I hope that your e-visit has been valuable and will speed your recovery.

## 2018-06-26 ENCOUNTER — Other Ambulatory Visit (INDEPENDENT_AMBULATORY_CARE_PROVIDER_SITE_OTHER): Payer: Self-pay | Admitting: Physician Assistant

## 2018-06-26 DIAGNOSIS — E8881 Metabolic syndrome: Secondary | ICD-10-CM

## 2018-06-26 MED FILL — metFORMIN HCL 500 MG TABS: 500 | 30 days supply | Qty: 30 | Fill #0

## 2018-06-26 MED FILL — ESCITALOPRAM 10 MG TABLET: 10 | 90 days supply | Qty: 90 | Fill #2

## 2018-07-07 ENCOUNTER — Ambulatory Visit (INDEPENDENT_AMBULATORY_CARE_PROVIDER_SITE_OTHER): Payer: 59 | Admitting: Physician Assistant

## 2018-07-07 VITALS — BP 128/79 | HR 79 | Temp 97.8°F | Ht 65.0 in | Wt 207.0 lb

## 2018-07-07 DIAGNOSIS — Z9189 Other specified personal risk factors, not elsewhere classified: Secondary | ICD-10-CM

## 2018-07-07 DIAGNOSIS — E669 Obesity, unspecified: Secondary | ICD-10-CM | POA: Diagnosis not present

## 2018-07-07 DIAGNOSIS — E8881 Metabolic syndrome: Secondary | ICD-10-CM

## 2018-07-07 DIAGNOSIS — E559 Vitamin D deficiency, unspecified: Secondary | ICD-10-CM | POA: Diagnosis not present

## 2018-07-07 DIAGNOSIS — Z6834 Body mass index (BMI) 34.0-34.9, adult: Secondary | ICD-10-CM

## 2018-07-07 MED ORDER — VITAMIN D (ERGOCALCIFEROL) 1.25 MG (50000 UNIT) PO CAPS: 50000.0000 [IU] | ORAL_CAPSULE | ORAL | 0 refills | Status: DC

## 2018-07-07 MED FILL — VIT D2 1.25 MG (50,000 UNIT: 1.25 MG | 28 days supply | Qty: 2 | Fill #0

## 2018-07-07 NOTE — Progress Notes (Signed)
Office: [phone]  /  Fax: [phone]   HPI:   Chief Complaint: OBESITY Hailey Noble is here to discuss her progress with her obesity treatment plan. She is on the keep a food journal with 1200 calories and 85 grams of protein daily and is following her eating plan approximately 90 % of the time. She states she is hiking for 120 minutes 4 times per week. Hailey Noble was on vacation and increased he physical activity. She states she would like to go back on the low carbohydrate meal plan.  Her weight is 207 lb (93.9 kg) today and has had a weight loss of 1 pound over a period of 4 weeks since her last visit. She has lost 5 lbs since starting treatment with [name].  Vitamin D Deficiency Hailey Noble has a diagnosis of vitamin D deficiency. She is currently taking prescription Vit D and denies nausea, vomiting or muscle weakness.  At risk for osteopenia and osteoporosis Hailey Noble is at higher risk of osteopenia and osteoporosis due to vitamin D deficiency.   Insulin Resistance Hailey Noble has a diagnosis of insulin resistance based on her elevated fasting insulin level >5. Although Hailey Noble's blood glucose readings are still under good control, insulin resistance puts her at greater risk of metabolic syndrome and diabetes. She is taking metformin currently and continues to work on diet and exercise to decrease risk of diabetes. She denies polyphagia.  ALLERGIES: Allergies  Allergen Reactions  . Coconut Fatty Acids Hives  . Codeine   . Hydrocod Polst-Cpm Polst Er     REACTION: rash on face and head, itching  . Mango Flavor Hives  . [name] Other (See Comments)  . Oxycodone-Acetaminophen     REACTION: rash on face and head, itching  . Tussin Cough [Dextromethorphan Hbr]     Broken out in hives  . Ultram [Tramadol] Other (See Comments)    Dizzy and low blood pressure.     MEDICATIONS: Current Outpatient Medications on File Prior to Visit  Medication Sig Dispense Refill  . acetaminophen (TYLENOL) 650 MG  CR tablet Take 650 mg by mouth every 8 (eight) hours as needed. pain    . Calcium Carbonate-Vit D-Min (CALCIUM 1200 PO) Take by mouth daily.      . cetirizine (ZYRTEC) 10 MG tablet Take 10 mg by mouth daily.    . Cholecalciferol (VITAMIN D-1000 MAX ST PO) Take 1 tablet by mouth.    . doxycycline (VIBRA-TABS) 100 MG tablet Take 1 tablet (100 mg total) by mouth 2 (two) times daily. 20 tablet 0  . escitalopram (LEXAPRO) 10 MG tablet Take 1 tablet (10 mg total) by mouth daily. 90 tablet 3  . famotidine (PEPCID) 20 MG tablet Take 20 mg daily as needed by mouth.     Marland Kitchen KRILL OIL 1000 MG CAPS Take 1 capsule by mouth daily.    . metFORMIN (GLUCOPHAGE) 500 MG tablet TAKE 1 TABLET (500 MG TOTAL) BY MOUTH DAILY WITH BREAKFAST. 30 tablet 0  . mometasone (NASONEX) 50 MCG/ACT nasal spray Place 2 sprays into the nose daily. 17 g 1  . Polyethyl Glycol-Propyl Glycol (SYSTANE FREE OP) Apply 1 drop to eye daily.    . Vitamin D, Ergocalciferol, (DRISDOL) 50000 units CAPS capsule Take 1 capsule (50,000 Units total) by mouth every 14 (fourteen) days. 2 capsule 0   No current facility-administered medications on file prior to visit.     PAST MEDICAL HISTORY: Past Medical History:  Diagnosis Date  . Allergic state 12/08/2016  . Allergy   .  Anxiety   . Back pain   . GERD (gastroesophageal reflux disease)   . Hyperlipidemia 06/09/2017  . Joint pain   . Knee pain, right 03/10/2017  . Plantar fasciitis   . Shortness of breath   . Sinusitis   . Vitamin D deficiency 11/22/2016  . Vitamin D deficiency     PAST SURGICAL HISTORY: Past Surgical History:  Procedure Laterality Date  . COLONOSCOPY  02-2009   with Henrene Pastor normal  . ESOPHAGOGASTRODUODENOSCOPY  2012  . GANGLION CYST EXCISION Right    right- wrist   . NASAL SEPTUM SURGERY    . NASAL SINUS SURGERY    . TMJ ARTHROSCOPY     left    SOCIAL HISTORY: Social History   Tobacco Use  . Smoking status: Never Smoker  . Smokeless tobacco: Never Used    Substance Use Topics  . Alcohol use: Yes    Alcohol/week: 0.0 oz    Comment: rare  . Drug use: No    FAMILY HISTORY: Family History  Problem Relation Age of Onset  . Hypertension Father   . Diabetes Father   . Hyperlipidemia Father   . Breast cancer Mother 48  . Cancer Mother        breast  . Thyroid disease Mother   . Sleep apnea Mother   . Colon cancer Paternal Grandfather        died at age 107  . Cancer Paternal Grandfather        GI CA  . Obesity Sister   . Diabetes Sister   . Hypertension Sister   . Heart disease Maternal Grandmother        MI  . Cancer Maternal Grandfather        lung, smoker  . Diabetes Maternal Grandfather   . Diabetes Paternal Grandmother   . Heart disease Paternal Grandmother   . Pancreatitis Sister   . Esophageal cancer Neg Hx   . Stomach cancer Neg Hx   . Stroke Neg Hx   . Colon polyps Neg Hx   . Rectal cancer Neg Hx   . Pancreatic cancer Neg Hx     ROS: Review of Systems  Constitutional: Positive for weight loss.  Gastrointestinal: Negative for nausea and vomiting.  Musculoskeletal:       Negative muscle weakness  Endo/Heme/Allergies:       Negative polyphagia    PHYSICAL EXAM: Blood pressure 128/79, pulse 79, temperature 97.8 F (36.6 C), temperature source Oral, height 5\' 5"  (1.651 m), weight 207 lb (93.9 kg), SpO2 96 %. Body mass index is 34.45 kg/m. Physical Exam  Constitutional: She is oriented to person, place, and time. She appears well-developed and well-nourished.  Cardiovascular: Normal rate.  Pulmonary/Chest: Effort normal.  Musculoskeletal: Normal range of motion.  Neurological: She is oriented to person, place, and time.  Skin: Skin is warm and dry.  Psychiatric: She has a normal mood and affect. Her behavior is normal.  Vitals reviewed.   RECENT LABS AND TESTS: BMET    Component Value Date/Time   NA 141 04/30/2018 1042   K 4.6 04/30/2018 1042   CL 102 04/30/2018 1042   CO2 25 04/30/2018 1042    GLUCOSE 83 04/30/2018 1042   GLUCOSE 85 11/22/2016 1521   GLUCOSE 86 11/13/2009   BUN 18 04/30/2018 1042   CREATININE 0.60 04/30/2018 1042   CREATININE 0.67 11/22/2016 1521   CALCIUM 9.7 04/30/2018 1042   GFRNONAA 105 04/30/2018 1042   GFRAA 121 04/30/2018 1042  Lab Results  Component Value Date   HGBA1C 5.4 04/30/2018   HGBA1C 5.2 10/06/2017   HGBA1C 5.1 04/06/2014   Lab Results  Component Value Date   INSULIN 8.7 04/30/2018   INSULIN 8.8 10/06/2017   CBC    Component Value Date/Time   WBC 9.6 10/06/2017 0850   WBC 9.8 11/22/2016 1521   RBC 4.71 10/06/2017 0850   RBC 4.41 11/22/2016 1521   HGB 13.8 10/06/2017 0850   HCT 42.4 10/06/2017 0850   PLT 328 11/22/2016 1521   PLT 381 11/13/2009   MCV 90 10/06/2017 0850   MCH 29.3 10/06/2017 0850   MCH 29.7 11/22/2016 1521   MCHC 32.5 10/06/2017 0850   MCHC 32.2 11/22/2016 1521   RDW 13.5 10/06/2017 0850   LYMPHSABS 1.8 10/06/2017 0850   MONOABS 0.5 05/26/2015 0931   EOSABS 0.1 10/06/2017 0850   BASOSABS 0.1 10/06/2017 0850   Iron/TIBC/Ferritin/ %Sat    Component Value Date/Time   FERRITIN 50 11/09/2008   Lipid Panel     Component Value Date/Time   CHOL 160 04/30/2018 1042   TRIG 134 04/30/2018 1042   TRIG 138 11/13/2009 1337   HDL 49 04/30/2018 1042   CHOLHDL 3.3 04/30/2018 1042   CHOLHDL 3.9 11/22/2016 1521   VLDL 32 (H) 11/22/2016 1521   LDLCALC 84 04/30/2018 1042   Hepatic Function Panel     Component Value Date/Time   PROT 7.1 04/30/2018 1042   ALBUMIN 4.6 04/30/2018 1042   AST 18 04/30/2018 1042   ALT 21 04/30/2018 1042   ALKPHOS 139 (H) 04/30/2018 1042   BILITOT 0.2 04/30/2018 1042   BILIDIR 0.1 05/26/2015 0931      Component Value Date/Time   TSH 1.690 10/06/2017 0850   TSH 1.30 11/22/2016 1521   TSH 1.67 05/26/2015 0931  Results for CAMPIntisar, Claudio I (MRN [id number]) as of 07/07/2018 09:11  Ref. Range 04/30/2018 10:42  Vitamin D, 25-Hydroxy Latest Ref Range: 30.0 - 100.0 ng/mL 62.7     ASSESSMENT AND PLAN: Vitamin D deficiency - Plan: Vitamin D, Ergocalciferol, (DRISDOL) 50000 units CAPS capsule  Insulin resistance  At risk for osteoporosis  Class 1 obesity with serious comorbidity and body mass index (BMI) of 34.0 to 34.9 in adult, unspecified obesity type  PLAN:  Vitamin D Deficiency Hailey Noble was informed that low vitamin D levels contributes to fatigue and are associated with obesity, breast, and colon cancer. Hailey Noble agrees to continue taking prescription Vit D @50 ,000 IU every 14 days #2 and we will refill for 1 month. She will follow up for routine testing of vitamin D, at least 2-3 times per year. She was informed of the risk of over-replacement of vitamin D and agrees to not increase her dose unless she discusses this with [name] first. Hailey Noble agrees to follow up with our clinic in 2 to 3 weeks.  At risk for osteopenia and osteoporosis Hailey Noble is at risk for osteopenia and osteoporsis due to her vitamin D deficiency. She was encouraged to take her vitamin D and follow her higher calcium diet and increase strengthening exercise to help strengthen her bones and decrease her risk of osteopenia and osteoporosis.  Insulin Resistance Hailey Noble will continue to work on weight loss, exercise, and decreasing simple carbohydrates in her diet to help decrease the risk of diabetes. We dicussed metformin including benefits and risks. She was informed that eating too many simple carbohydrates or too many calories at one sitting increases the likelihood of GI side effects. Hailey Noble agrees  to continue taking her medications and she agrees to follow up with our clinic in 2 to 3 weeks as directed to monitor her progress.  Obesity Hailey Noble is currently in the action stage of change. As such, her goal is to continue with weight loss efforts She has agreed to follow a lower carbohydrate, vegetable and lean protein rich diet plan Hailey Noble has been instructed to work up to a goal of 150 minutes of  combined cardio and strengthening exercise per week for weight loss and overall health benefits. We discussed the following Behavioral Modification Strategies today: increasing lean protein intake and decreasing simple carbohydrates    Hailey Noble has agreed to follow up with our clinic in 2 to 3 weeks. She was informed of the importance of frequent follow up visits to maximize her success with intensive lifestyle modifications for her multiple health conditions.   OBESITY BEHAVIORAL INTERVENTION VISIT  Today's visit was # 15 out of 22.  Starting weight: 212 lbs Starting date: 10/06/17 Today's weight : 207 lbs Today's date: 07/07/2018 Total lbs lost to date: 5 (Patients must lose 7 lbs in the first 6 months to continue with counseling)   ASK: We discussed the diagnosis of obesity with Hailey Noble today and Hailey Noble agreed to give [name] permission to discuss obesity behavioral modification therapy today.  ASSESS: Hailey Noble has the diagnosis of obesity and her BMI today is 34.45 Hailey Noble is in the action stage of change   ADVISE: Hailey Noble was educated on the multiple health risks of obesity as well as the benefit of weight loss to improve her health. She was advised of the need for long term treatment and the importance of lifestyle modifications.  AGREE: Multiple dietary modification options and treatment options were discussed and  Hailey Noble agreed to the above obesity treatment plan.   Wilhemena Durie, am acting as transcriptionist for Lacy Duverney, PA-C I, Lacy Duverney [facility], have reviewed this note and agree with its content

## 2018-07-27 ENCOUNTER — Ambulatory Visit (INDEPENDENT_AMBULATORY_CARE_PROVIDER_SITE_OTHER): Payer: 59 | Admitting: Family Medicine

## 2018-07-27 VITALS — BP 131/84 | HR 76 | Temp 98.0°F | Ht 65.0 in | Wt 207.0 lb

## 2018-07-27 DIAGNOSIS — Z9189 Other specified personal risk factors, not elsewhere classified: Secondary | ICD-10-CM | POA: Diagnosis not present

## 2018-07-27 DIAGNOSIS — E8881 Metabolic syndrome: Secondary | ICD-10-CM

## 2018-07-27 DIAGNOSIS — Z6834 Body mass index (BMI) 34.0-34.9, adult: Secondary | ICD-10-CM

## 2018-07-27 DIAGNOSIS — E669 Obesity, unspecified: Secondary | ICD-10-CM

## 2018-07-27 DIAGNOSIS — E559 Vitamin D deficiency, unspecified: Secondary | ICD-10-CM

## 2018-07-27 MED ORDER — METFORMIN HCL 500 MG PO TABS: 500.0000 mg | ORAL_TABLET | Freq: Every day | ORAL | 0 refills | Status: DC

## 2018-07-27 MED ORDER — VITAMIN D (ERGOCALCIFEROL) 1.25 MG (50000 UNIT) PO CAPS: 50000.0000 [IU] | ORAL_CAPSULE | ORAL | 0 refills | Status: DC

## 2018-07-27 MED FILL — metFORMIN HCL 500 MG TABS: 500 | 30 days supply | Qty: 30 | Fill #0

## 2018-07-27 NOTE — Progress Notes (Signed)
Office: [phone]  /  Fax: [phone]   HPI:   Chief Complaint: OBESITY Hailey Noble is here to discuss her progress with her obesity treatment plan. She is on the lower carbohydrate, vegetable and lean protein rich diet plan and is following her eating plan approximately 90 % of the time. She states she is walking 30 to 45 minutes 4 times per week. Hailey Noble found that she has been craving a significant amount of sugar and sweets. Her weight is 207 lb (93.9 kg) today and has maintained weight over a period of 3 weeks since her last visit. She has lost 5 lbs since starting treatment with [name].  Insulin Resistance Hailey Noble has a diagnosis of insulin resistance based on her elevated fasting insulin level >5. Although Hailey Noble's blood glucose readings are still under good control, insulin resistance puts her at greater risk of metabolic syndrome and diabetes. Hailey Noble has noticed an increase in sweet cravings. She is taking metformin currently and continues to work on diet and exercise to decrease risk of diabetes.  At risk for diabetes Hailey Noble is at higher than average risk for developing diabetes due to her obesity and insulin resistance. She currently denies polyuria or polydipsia.  Vitamin D deficiency Hailey Noble has a diagnosis of vitamin D deficiency. She is currently taking vit D and admits fatigue, but denies nausea, vomiting or muscle weakness.  ALLERGIES: Allergies  Allergen Reactions  . Coconut Fatty Acids Hives  . Codeine   . Hydrocod Polst-Cpm Polst Er     REACTION: rash on face and head, itching  . Mango Flavor Hives  . [name] Other (See Comments)  . Oxycodone-Acetaminophen     REACTION: rash on face and head, itching  . Tussin Cough [Dextromethorphan Hbr]     Broken out in hives  . Ultram [Tramadol] Other (See Comments)    Dizzy and low blood pressure.     MEDICATIONS: Current Outpatient Medications on File Prior to Visit  Medication Sig Dispense Refill  . acetaminophen  (TYLENOL) 650 MG CR tablet Take 650 mg by mouth every 8 (eight) hours as needed. pain    . Calcium Carbonate-Vit D-Min (CALCIUM 1200 PO) Take by mouth daily.      . cetirizine (ZYRTEC) 10 MG tablet Take 10 mg by mouth daily.    . Cholecalciferol (VITAMIN D-1000 MAX ST PO) Take 1 tablet by mouth.    . escitalopram (LEXAPRO) 10 MG tablet Take 1 tablet (10 mg total) by mouth daily. 90 tablet 3  . famotidine (PEPCID) 20 MG tablet Take 20 mg daily as needed by mouth.     Marland Kitchen KRILL OIL 1000 MG CAPS Take 1 capsule by mouth daily.    . mometasone (NASONEX) 50 MCG/ACT nasal spray Place 2 sprays into the nose daily. 17 g 1  . Polyethyl Glycol-Propyl Glycol (SYSTANE FREE OP) Apply 1 drop to eye daily.     No current facility-administered medications on file prior to visit.     PAST MEDICAL HISTORY: Past Medical History:  Diagnosis Date  . Allergic state 12/08/2016  . Allergy   . Anxiety   . Back pain   . GERD (gastroesophageal reflux disease)   . Hyperlipidemia 06/09/2017  . Joint pain   . Knee pain, right 03/10/2017  . Plantar fasciitis   . Shortness of breath   . Sinusitis   . Vitamin D deficiency 11/22/2016  . Vitamin D deficiency     PAST SURGICAL HISTORY: Past Surgical History:  Procedure Laterality Date  .  COLONOSCOPY  02-2009   with Henrene Pastor normal  . ESOPHAGOGASTRODUODENOSCOPY  2012  . GANGLION CYST EXCISION Right    right- wrist   . NASAL SEPTUM SURGERY    . NASAL SINUS SURGERY    . TMJ ARTHROSCOPY     left    SOCIAL HISTORY: Social History   Tobacco Use  . Smoking status: Never Smoker  . Smokeless tobacco: Never Used  Substance Use Topics  . Alcohol use: Yes    Alcohol/week: 0.0 oz    Comment: rare  . Drug use: No    FAMILY HISTORY: Family History  Problem Relation Age of Onset  . Hypertension Father   . Diabetes Father   . Hyperlipidemia Father   . Breast cancer Mother 59  . Cancer Mother        breast  . Thyroid disease Mother   . Sleep apnea Mother   .  Colon cancer Paternal Grandfather        died at age 35  . Cancer Paternal Grandfather        GI CA  . Obesity Sister   . Diabetes Sister   . Hypertension Sister   . Heart disease Maternal Grandmother        MI  . Cancer Maternal Grandfather        lung, smoker  . Diabetes Maternal Grandfather   . Diabetes Paternal Grandmother   . Heart disease Paternal Grandmother   . Pancreatitis Sister   . Esophageal cancer Neg Hx   . Stomach cancer Neg Hx   . Stroke Neg Hx   . Colon polyps Neg Hx   . Rectal cancer Neg Hx   . Pancreatic cancer Neg Hx     ROS: Review of Systems  Constitutional: Positive for malaise/fatigue. Negative for weight loss.  Gastrointestinal: Negative for nausea and vomiting.  Genitourinary: Negative for frequency.  Musculoskeletal:       Negative for muscle weakness  Endo/Heme/Allergies: Negative for polydipsia.       Positive for sweet cravings    PHYSICAL EXAM: Blood pressure 131/84, pulse 76, temperature 98 F (36.7 C), temperature source Oral, height 5\' 5"  (1.651 m), weight 207 lb (93.9 kg), SpO2 96 %. Body mass index is 34.45 kg/m. Physical Exam  Constitutional: She is oriented to person, place, and time. She appears well-developed and well-nourished.  Cardiovascular: Normal rate.  Pulmonary/Chest: Effort normal.  Musculoskeletal: Normal range of motion.  Neurological: She is oriented to person, place, and time.  Skin: Skin is warm and dry.  Psychiatric: She has a normal mood and affect. Her behavior is normal.  Vitals reviewed.   RECENT LABS AND TESTS: BMET    Component Value Date/Time   NA 141 04/30/2018 1042   K 4.6 04/30/2018 1042   CL 102 04/30/2018 1042   CO2 25 04/30/2018 1042   GLUCOSE 83 04/30/2018 1042   GLUCOSE 85 11/22/2016 1521   GLUCOSE 86 11/13/2009   BUN 18 04/30/2018 1042   CREATININE 0.60 04/30/2018 1042   CREATININE 0.67 11/22/2016 1521   CALCIUM 9.7 04/30/2018 1042   GFRNONAA 105 04/30/2018 1042   GFRAA 121  04/30/2018 1042   Lab Results  Component Value Date   HGBA1C 5.4 04/30/2018   HGBA1C 5.2 10/06/2017   HGBA1C 5.1 04/06/2014   Lab Results  Component Value Date   INSULIN 8.7 04/30/2018   INSULIN 8.8 10/06/2017   CBC    Component Value Date/Time   WBC 9.6 10/06/2017 0850   WBC  9.8 11/22/2016 1521   RBC 4.71 10/06/2017 0850   RBC 4.41 11/22/2016 1521   HGB 13.8 10/06/2017 0850   HCT 42.4 10/06/2017 0850   PLT 328 11/22/2016 1521   PLT 381 11/13/2009   MCV 90 10/06/2017 0850   MCH 29.3 10/06/2017 0850   MCH 29.7 11/22/2016 1521   MCHC 32.5 10/06/2017 0850   MCHC 32.2 11/22/2016 1521   RDW 13.5 10/06/2017 0850   LYMPHSABS 1.8 10/06/2017 0850   MONOABS 0.5 05/26/2015 0931   EOSABS 0.1 10/06/2017 0850   BASOSABS 0.1 10/06/2017 0850   Iron/TIBC/Ferritin/ %Sat    Component Value Date/Time   FERRITIN 50 11/09/2008   Lipid Panel     Component Value Date/Time   CHOL 160 04/30/2018 1042   TRIG 134 04/30/2018 1042   TRIG 138 11/13/2009 1337   HDL 49 04/30/2018 1042   CHOLHDL 3.3 04/30/2018 1042   CHOLHDL 3.9 11/22/2016 1521   VLDL 32 (H) 11/22/2016 1521   LDLCALC 84 04/30/2018 1042   Hepatic Function Panel     Component Value Date/Time   PROT 7.1 04/30/2018 1042   ALBUMIN 4.6 04/30/2018 1042   AST 18 04/30/2018 1042   ALT 21 04/30/2018 1042   ALKPHOS 139 (H) 04/30/2018 1042   BILITOT 0.2 04/30/2018 1042   BILIDIR 0.1 05/26/2015 0931      Component Value Date/Time   TSH 1.690 10/06/2017 0850   TSH 1.30 11/22/2016 1521   TSH 1.67 05/26/2015 0931   Results for CAMPAshyla, Luth I (MRN [id number]) as of 07/27/2018 14:15  Ref. Range 04/30/2018 10:42  Vitamin D, 25-Hydroxy Latest Ref Range: 30.0 - 100.0 ng/mL 62.7   ASSESSMENT AND PLAN: Insulin resistance - Plan: metFORMIN (GLUCOPHAGE) 500 MG tablet  Vitamin D deficiency - Plan: Vitamin D, Ergocalciferol, (DRISDOL) 50000 units CAPS capsule  At risk for diabetes mellitus  Class 1 obesity with serious comorbidity and  body mass index (BMI) of 34.0 to 34.9 in adult, unspecified obesity type  PLAN:  Insulin Resistance Aryaa will continue to work on weight loss, exercise, and decreasing simple carbohydrates in her diet to help decrease the risk of diabetes. We dicussed metformin including benefits and risks. She was informed that eating too many simple carbohydrates or too many calories at one sitting increases the likelihood of GI side effects. Nance agreed to continue  metformin for now and prescription was written today for 1 month refill. Alissandra agreed to follow up with [name] as directed to monitor her progress.  Diabetes risk counseling Carmesha was given extended (15 minutes) diabetes prevention counseling today. She is 52 y.o. female and has risk factors for diabetes including obesity and insulin resistance. We discussed intensive lifestyle modifications today with an emphasis on weight loss as well as increasing exercise and decreasing simple carbohydrates in her diet.  Vitamin D Deficiency Melenda was informed that low vitamin D levels contributes to fatigue and are associated with obesity, breast, and colon cancer. She agrees to continue to take prescription Vit D @50 ,000 IU every week #4 with no refills and will follow up for routine testing of vitamin D, at least 2-3 times per year. She was informed of the risk of over-replacement of vitamin D and agrees to not increase her dose unless she discusses this with [name] first. Malanie agrees to follow up as directed.  Obesity Nylia is currently in the action stage of change. As such, her goal is to continue with weight loss efforts She has agreed to follow the Category  2 plan Shalisha has been instructed to work up to a goal of 150 minutes of combined cardio and strengthening exercise per week for weight loss and overall health benefits. We discussed the following Behavioral Modification Strategies today: better snacking choices, planning for success, increasing lean  protein intake, increasing vegetables and work on meal planning and easy cooking plans  Evvie has agreed to follow up with our clinic in 2 weeks. She was informed of the importance of frequent follow up visits to maximize her success with intensive lifestyle modifications for her multiple health conditions.   OBESITY BEHAVIORAL INTERVENTION VISIT  Today's visit was # 16 out of 22.  Starting weight: 212 lbs Starting date: 10/06/17 Today's weight : 207 lbs  Today's date: 07/27/2018 Total lbs lost to date: 5    ASK: We discussed the diagnosis of obesity with Hebert Soho today and Breyah agreed to give [name] permission to discuss obesity behavioral modification therapy today.  ASSESS: Genessis has the diagnosis of obesity and her BMI today is 34.45 Darbi is in the action stage of change   ADVISE: Margaux was educated on the multiple health risks of obesity as well as the benefit of weight loss to improve her health. She was advised of the need for long term treatment and the importance of lifestyle modifications.  AGREE: Multiple dietary modification options and treatment options were discussed and  Yoshiye agreed to the above obesity treatment plan.  I, Doreene Nest, am acting as transcriptionist for Eber [name], MD  I have reviewed the above documentation for accuracy and completeness, and I agree with the above. - Ilene Qua, MD

## 2018-07-29 MED FILL — VIT D2 1.25 MG (50,000 UNIT: 1.25 MG | 28 days supply | Qty: 2 | Fill #0

## 2018-08-11 ENCOUNTER — Ambulatory Visit (INDEPENDENT_AMBULATORY_CARE_PROVIDER_SITE_OTHER): Payer: 59 | Admitting: Family Medicine

## 2018-08-11 VITALS — BP 123/74 | HR 79 | Temp 97.1°F | Ht 65.0 in | Wt 209.0 lb

## 2018-08-11 DIAGNOSIS — E559 Vitamin D deficiency, unspecified: Secondary | ICD-10-CM | POA: Diagnosis not present

## 2018-08-11 DIAGNOSIS — E8881 Metabolic syndrome: Secondary | ICD-10-CM

## 2018-08-11 DIAGNOSIS — E669 Obesity, unspecified: Secondary | ICD-10-CM

## 2018-08-11 DIAGNOSIS — Z6834 Body mass index (BMI) 34.0-34.9, adult: Secondary | ICD-10-CM

## 2018-08-11 DIAGNOSIS — Z9189 Other specified personal risk factors, not elsewhere classified: Secondary | ICD-10-CM | POA: Diagnosis not present

## 2018-08-11 MED ORDER — VITAMIN D (ERGOCALCIFEROL) 1.25 MG (50000 UNIT) PO CAPS: 50000.0000 [IU] | ORAL_CAPSULE | ORAL | 0 refills | Status: DC

## 2018-08-11 NOTE — Progress Notes (Signed)
Office: [phone]  /  Fax: [phone]   HPI:   Chief Complaint: OBESITY Hailey Noble is here to discuss her progress with her obesity treatment plan. She is on the Category 2 plan and is following her eating plan approximately 85 % of the time. She states she is walking and swimming for 30-60 minutes 3 times per week. Hailey Noble is making an effort to stick closer to Category 2 but has had a few too many dinners and indulgences.  Her weight is 209 lb (94.8 kg) today and has gained 2 pounds since her last visit. She has lost 3 lbs since starting treatment with [name].  Vitamin D Deficiency Hailey Noble has a diagnosis of vitamin D deficiency. She is currently taking prescription Vit D She notes fatigue and denies nausea, vomiting or muscle weakness.  At risk for osteopenia and osteoporosis Hailey Noble is at higher risk of osteopenia and osteoporosis due to vitamin D deficiency.   Insulin Resistance Hailey Noble has a diagnosis of insulin resistance based on her elevated fasting insulin level >5. Although Hailey Noble's blood glucose readings are still under good control, insulin resistance puts her at greater risk of metabolic syndrome and diabetes. She notes carbohydrate cravings and carbohydrate indulgences. She denies GI side effects of metformin and continues to work on diet and exercise to decrease risk of diabetes.  ALLERGIES: Allergies  Allergen Reactions  . Coconut Fatty Acids Hives  . Codeine   . Hydrocod Polst-Cpm Polst Er     REACTION: rash on face and head, itching  . Mango Flavor Hives  . [name] Other (See Comments)  . Oxycodone-Acetaminophen     REACTION: rash on face and head, itching  . Tussin Cough [Dextromethorphan Hbr]     Broken out in hives  . Ultram [Tramadol] Other (See Comments)    Dizzy and low blood pressure.     MEDICATIONS: Current Outpatient Medications on File Prior to Visit  Medication Sig Dispense Refill  . acetaminophen (TYLENOL) 650 MG CR tablet Take 650 mg by mouth  every 8 (eight) hours as needed. pain    . Calcium Carbonate-Vit D-Min (CALCIUM 1200 PO) Take by mouth daily.      . cetirizine (ZYRTEC) 10 MG tablet Take 10 mg by mouth daily.    . Cholecalciferol (VITAMIN D-1000 MAX ST PO) Take 1 tablet by mouth.    . escitalopram (LEXAPRO) 10 MG tablet Take 1 tablet (10 mg total) by mouth daily. 90 tablet 3  . famotidine (PEPCID) 20 MG tablet Take 20 mg daily as needed by mouth.     Marland Kitchen KRILL OIL 1000 MG CAPS Take 1 capsule by mouth daily.    . metFORMIN (GLUCOPHAGE) 500 MG tablet Take 1 tablet (500 mg total) by mouth daily with breakfast. 30 tablet 0  . mometasone (NASONEX) 50 MCG/ACT nasal spray Place 2 sprays into the nose daily. 17 g 1  . Polyethyl Glycol-Propyl Glycol (SYSTANE FREE OP) Apply 1 drop to eye daily.    . Vitamin D, Ergocalciferol, (DRISDOL) 50000 units CAPS capsule Take 1 capsule (50,000 Units total) by mouth every 14 (fourteen) days. 2 capsule 0   No current facility-administered medications on file prior to visit.     PAST MEDICAL HISTORY: Past Medical History:  Diagnosis Date  . Allergic state 12/08/2016  . Allergy   . Anxiety   . Back pain   . GERD (gastroesophageal reflux disease)   . Hyperlipidemia 06/09/2017  . Joint pain   . Knee pain, right 03/10/2017  .  Plantar fasciitis   . Shortness of breath   . Sinusitis   . Vitamin D deficiency 11/22/2016  . Vitamin D deficiency     PAST SURGICAL HISTORY: Past Surgical History:  Procedure Laterality Date  . COLONOSCOPY  02-2009   with Henrene Pastor normal  . ESOPHAGOGASTRODUODENOSCOPY  2012  . GANGLION CYST EXCISION Right    right- wrist   . NASAL SEPTUM SURGERY    . NASAL SINUS SURGERY    . TMJ ARTHROSCOPY     left    SOCIAL HISTORY: Social History   Tobacco Use  . Smoking status: Never Smoker  . Smokeless tobacco: Never Used  Substance Use Topics  . Alcohol use: Yes    Alcohol/week: 0.0 standard drinks    Comment: 53 rare  . Drug use: No    FAMILY HISTORY: Family  History  Problem Relation Age of Onset  . Hypertension Father   . Diabetes Father   . Hyperlipidemia Father   . Breast cancer Mother 61  . Cancer Mother        breast  . Thyroid disease Mother   . Sleep apnea Mother   . Colon cancer Paternal Grandfather        died at age 62  . Cancer Paternal Grandfather        GI CA  . Obesity Sister   . Diabetes Sister   . Hypertension Sister   . Heart disease Maternal Grandmother        MI  . Cancer Maternal Grandfather        lung, smoker  . Diabetes Maternal Grandfather   . Diabetes Paternal Grandmother   . Heart disease Paternal Grandmother   . Pancreatitis Sister   . Esophageal cancer Neg Hx   . Stomach cancer Neg Hx   . Stroke Neg Hx   . Colon polyps Neg Hx   . Rectal cancer Neg Hx   . Pancreatic cancer Neg Hx     ROS: Review of Systems  Constitutional: Positive for malaise/fatigue. Negative for weight loss.  Gastrointestinal: Negative for nausea and vomiting.  Musculoskeletal:       Negative muscle weakness    PHYSICAL EXAM: Blood pressure 123/74, pulse 79, temperature (!) 97.1 F (36.2 C), temperature source Oral, height 5\' 5"  (1.651 m), weight 209 lb (94.8 kg), SpO2 98 %. Body mass index is 34.78 kg/m. Physical Exam  Constitutional: She is oriented to person, place, and time. She appears well-developed and well-nourished.  Cardiovascular: Normal rate.  Pulmonary/Chest: Effort normal.  Musculoskeletal: Normal range of motion.  Neurological: She is oriented to person, place, and time.  Skin: Skin is warm and dry.  Psychiatric: She has a normal mood and affect. Her behavior is normal.  Vitals reviewed.   RECENT LABS AND TESTS: BMET    Component Value Date/Time   NA 141 04/30/2018 1042   K 4.6 04/30/2018 1042   CL 102 04/30/2018 1042   CO2 25 04/30/2018 1042   GLUCOSE 83 04/30/2018 1042   GLUCOSE 85 11/22/2016 1521   GLUCOSE 86 11/13/2009   BUN 18 04/30/2018 1042   CREATININE 0.60 04/30/2018 1042    CREATININE 0.67 11/22/2016 1521   CALCIUM 9.7 04/30/2018 1042   GFRNONAA 105 04/30/2018 1042   GFRAA 121 04/30/2018 1042   Lab Results  Component Value Date   HGBA1C 5.4 04/30/2018   HGBA1C 5.2 10/06/2017   HGBA1C 5.1 04/06/2014   Lab Results  Component Value Date   INSULIN 8.7 04/30/2018  INSULIN 8.8 10/06/2017   CBC    Component Value Date/Time   WBC 9.6 10/06/2017 0850   WBC 9.8 11/22/2016 1521   RBC 4.71 10/06/2017 0850   RBC 4.41 11/22/2016 1521   HGB 13.8 10/06/2017 0850   HCT 42.4 10/06/2017 0850   PLT 328 11/22/2016 1521   PLT 381 11/13/2009   MCV 90 10/06/2017 0850   MCH 29.3 10/06/2017 0850   MCH 29.7 11/22/2016 1521   MCHC 32.5 10/06/2017 0850   MCHC 32.2 11/22/2016 1521   RDW 13.5 10/06/2017 0850   LYMPHSABS 1.8 10/06/2017 0850   MONOABS 0.5 05/26/2015 0931   EOSABS 0.1 10/06/2017 0850   BASOSABS 0.1 10/06/2017 0850   Iron/TIBC/Ferritin/ %Sat    Component Value Date/Time   FERRITIN 50 11/09/2008   Lipid Panel     Component Value Date/Time   CHOL 160 04/30/2018 1042   TRIG 134 04/30/2018 1042   TRIG 138 11/13/2009 1337   HDL 49 04/30/2018 1042   CHOLHDL 3.3 04/30/2018 1042   CHOLHDL 3.9 11/22/2016 1521   VLDL 32 (H) 11/22/2016 1521   LDLCALC 84 04/30/2018 1042   Hepatic Function Panel     Component Value Date/Time   PROT 7.1 04/30/2018 1042   ALBUMIN 4.6 04/30/2018 1042   AST 18 04/30/2018 1042   ALT 21 04/30/2018 1042   ALKPHOS 139 (H) 04/30/2018 1042   BILITOT 0.2 04/30/2018 1042   BILIDIR 0.1 05/26/2015 0931      Component Value Date/Time   TSH 1.690 10/06/2017 0850   TSH 1.30 11/22/2016 1521   TSH 1.67 05/26/2015 0931  Results for DESTANIE, TIBBETTS I (MRN [id number]) as of 08/11/2018 09:32  Ref. Range 04/30/2018 10:42  Vitamin D, 25-Hydroxy Latest Ref Range: 30.0 - 100.0 ng/mL 62.7    ASSESSMENT AND PLAN: Vitamin D deficiency - Plan: Vitamin D, Ergocalciferol, (DRISDOL) 50000 units CAPS capsule  Insulin resistance  At risk for  osteoporosis  Class 1 obesity with serious comorbidity and body mass index (BMI) of 34.0 to 34.9 in adult, unspecified obesity type  PLAN:  Vitamin D Deficiency Jacara was informed that low vitamin D levels contributes to fatigue and are associated with obesity, breast, and colon cancer. Brylea agrees to continue taking prescription Vit D @50 ,000 IU every week #4 and we will refill for 1 month. She will follow up for routine testing of vitamin D, at least 2-3 times per year. She was informed of the risk of over-replacement of vitamin D and agrees to not increase her dose unless she discusses this with [name] first. Mar agrees to follow up with our clinic in 2 weeks.  At risk for osteopenia and osteoporosis Ajayla is at risk for osteopenia and osteoporsis due to her vitamin D deficiency. She was encouraged to take her vitamin D and follow her higher calcium diet and increase strengthening exercise to help strengthen her bones and decrease her risk of osteopenia and osteoporosis.  Insulin Resistance Philomene will continue to work on weight loss, exercise, and decreasing simple carbohydrates in her diet to help decrease the risk of diabetes. We dicussed metformin including benefits and risks. She was informed that eating too many simple carbohydrates or too many calories at one sitting increases the likelihood of GI side effects. Evanie agrees to continue taking metformin and she agrees to follow up with our clinic in 2 weeks as directed to monitor her progress.  Obesity Tenise is currently in the action stage of change. As such, her goal is to  continue with weight loss efforts She has agreed to follow the Category 2 plan Sabrina has been instructed to work up to a goal of 150 minutes of combined cardio and strengthening exercise per week for weight loss and overall health benefits. We discussed the following Behavioral Modification Strategies today: increasing lean protein intake, increasing  vegetables, work on meal planning and easy cooking plans, and planning for success   Deysha has agreed to follow up with our clinic in 2 weeks. She was informed of the importance of frequent follow up visits to maximize her success with intensive lifestyle modifications for her multiple health conditions.   OBESITY BEHAVIORAL INTERVENTION VISIT  Today's visit was # 17 out of 22.  Starting weight: 212 lbs Starting date: 10/06/17 Today's weight : 209 lbs Today's date: 08/11/2018 Total lbs lost to date: 3    ASK: We discussed the diagnosis of obesity with Hebert Soho today and Antonique agreed to give [name] permission to discuss obesity behavioral modification therapy today.  ASSESS: Lyvonne has the diagnosis of obesity and her BMI today is 34.78 Lasandra is in the action stage of change   ADVISE: Lashawna was educated on the multiple health risks of obesity as well as the benefit of weight loss to improve her health. She was advised of the need for long term treatment and the importance of lifestyle modifications.  AGREE: Multiple dietary modification options and treatment options were discussed and  Keyona agreed to the above obesity treatment plan.  I, Trixie Dredge, am acting as transcriptionist for Ilene Qua, MD  I have reviewed the above documentation for accuracy and completeness, and I agree with the above. - Ilene Qua, MD

## 2018-08-20 MED FILL — VIT D2 1.25 MG (50,000 UNIT: 1.25 MG | 56 days supply | Qty: 4 | Fill #0

## 2018-08-31 ENCOUNTER — Ambulatory Visit (INDEPENDENT_AMBULATORY_CARE_PROVIDER_SITE_OTHER): Payer: 59 | Admitting: Family Medicine

## 2018-08-31 VITALS — BP 129/83 | HR 78 | Temp 98.3°F | Ht 65.0 in | Wt 210.0 lb

## 2018-08-31 DIAGNOSIS — E8881 Metabolic syndrome: Secondary | ICD-10-CM | POA: Diagnosis not present

## 2018-08-31 DIAGNOSIS — E559 Vitamin D deficiency, unspecified: Secondary | ICD-10-CM | POA: Diagnosis not present

## 2018-08-31 DIAGNOSIS — Z9189 Other specified personal risk factors, not elsewhere classified: Secondary | ICD-10-CM | POA: Diagnosis not present

## 2018-08-31 DIAGNOSIS — Z6835 Body mass index (BMI) 35.0-35.9, adult: Secondary | ICD-10-CM

## 2018-08-31 MED ORDER — VITAMIN D (ERGOCALCIFEROL) 1.25 MG (50000 UNIT) PO CAPS: 50000.0000 [IU] | ORAL_CAPSULE | ORAL | 0 refills | Status: DC

## 2018-08-31 MED ORDER — METFORMIN HCL 500 MG PO TABS: 500.0000 mg | ORAL_TABLET | Freq: Every day | ORAL | 0 refills | Status: DC

## 2018-08-31 MED FILL — metFORMIN HCL 500 MG TABS: 500 | 30 days supply | Qty: 30 | Fill #0

## 2018-08-31 NOTE — Progress Notes (Signed)
Office: [phone]  /  Fax: [phone]   HPI:   Chief Complaint: OBESITY Kaliopi is here to discuss her progress with her obesity treatment plan. She is on the Category 2 plan and is following her eating plan approximately 90 % of the time. She states she is swimming and walking for 30-60 minutes 3 times per week. Heath was just offered a new job, she feels a huge stress relief. She is still struggling with meal prep and eating out at dinner.  Her weight is 210 lb (95.3 kg) today and has gained 1 pound since her last visit. She has lost 2 lbs since starting treatment with [name].  Insulin Resistance Ubah has a diagnosis of insulin resistance based on her elevated fasting insulin level >5. Although Regina's blood glucose readings are still under good control, insulin resistance puts her at greater risk of metabolic syndrome and diabetes. She denies carbohydrate cravings but occasional indulgences in fast food. She denies GI upset with metformin and continues to work on diet and exercise to decrease risk of diabetes.  At risk for diabetes Haniya is at higher than average risk for developing diabetes due to her obesity and insulin resistance. She currently denies polyuria or polydipsia.  Vitamin D Deficiency Ameri has a diagnosis of vitamin D deficiency. She is currently taking prescription Vit D. She notes fatigue and denies nausea, vomiting or muscle weakness.  ALLERGIES: Allergies  Allergen Reactions  . Coconut Fatty Acids Hives  . Codeine   . Hydrocod Polst-Cpm Polst Er     REACTION: rash on face and head, itching  . Mango Flavor Hives  . [name] Other (See Comments)  . Oxycodone-Acetaminophen     REACTION: rash on face and head, itching  . Tussin Cough [Dextromethorphan Hbr]     Broken out in hives  . Ultram [Tramadol] Other (See Comments)    Dizzy and low blood pressure.     MEDICATIONS: Current Outpatient Medications on File Prior to Visit  Medication Sig Dispense  Refill  . acetaminophen (TYLENOL) 650 MG CR tablet Take 650 mg by mouth every 8 (eight) hours as needed. pain    . Calcium Carbonate-Vit D-Min (CALCIUM 1200 PO) Take by mouth daily.      . cetirizine (ZYRTEC) 10 MG tablet Take 10 mg by mouth daily.    . Cholecalciferol (VITAMIN D-1000 MAX ST PO) Take 1 tablet by mouth.    . escitalopram (LEXAPRO) 10 MG tablet Take 1 tablet (10 mg total) by mouth daily. 90 tablet 3  . famotidine (PEPCID) 20 MG tablet Take 20 mg daily as needed by mouth.     Marland Kitchen KRILL OIL 1000 MG CAPS Take 1 capsule by mouth daily.    . metFORMIN (GLUCOPHAGE) 500 MG tablet Take 1 tablet (500 mg total) by mouth daily with breakfast. 30 tablet 0  . mometasone (NASONEX) 50 MCG/ACT nasal spray Place 2 sprays into the nose daily. 17 g 1  . Polyethyl Glycol-Propyl Glycol (SYSTANE FREE OP) Apply 1 drop to eye daily.    . Vitamin D, Ergocalciferol, (DRISDOL) 50000 units CAPS capsule Take 1 capsule (50,000 Units total) by mouth every 14 (fourteen) days. 4 capsule 0   No current facility-administered medications on file prior to visit.     PAST MEDICAL HISTORY: Past Medical History:  Diagnosis Date  . Allergic state 12/08/2016  . Allergy   . Anxiety   . Back pain   . GERD (gastroesophageal reflux disease)   . Hyperlipidemia 06/09/2017  .  Joint pain   . Knee pain, right 03/10/2017  . Plantar fasciitis   . Shortness of breath   . Sinusitis   . Vitamin D deficiency 11/22/2016  . Vitamin D deficiency     PAST SURGICAL HISTORY: Past Surgical History:  Procedure Laterality Date  . COLONOSCOPY  02-2009   with Henrene Pastor normal  . ESOPHAGOGASTRODUODENOSCOPY  2012  . GANGLION CYST EXCISION Right    right- wrist   . NASAL SEPTUM SURGERY    . NASAL SINUS SURGERY    . TMJ ARTHROSCOPY     left    SOCIAL HISTORY: Social History   Tobacco Use  . Smoking status: Never Smoker  . Smokeless tobacco: Never Used  Substance Use Topics  . Alcohol use: Yes    Alcohol/week: 0.0 standard  drinks    Comment: rare  . Drug use: No    FAMILY HISTORY: Family History  Problem Relation Age of Onset  . Hypertension Father   . Diabetes Father   . Hyperlipidemia Father   . Breast cancer Mother 30  . Cancer Mother        breast  . Thyroid disease Mother   . Sleep apnea Mother   . Colon cancer Paternal Grandfather        died at age 78  . Cancer Paternal Grandfather        GI CA  . Obesity Sister   . Diabetes Sister   . Hypertension Sister   . Heart disease Maternal Grandmother        MI  . Cancer Maternal Grandfather        lung, smoker  . Diabetes Maternal Grandfather   . Diabetes Paternal Grandmother   . Heart disease Paternal Grandmother   . Pancreatitis Sister   . Esophageal cancer Neg Hx   . Stomach cancer Neg Hx   . Stroke Neg Hx   . Colon polyps Neg Hx   . Rectal cancer Neg Hx   . Pancreatic cancer Neg Hx     ROS: Review of Systems  Constitutional: Positive for malaise/fatigue. Negative for weight loss.  Gastrointestinal: Negative for nausea and vomiting.  Genitourinary: Negative for frequency.  Musculoskeletal:       Negative muscle weakness  Endo/Heme/Allergies: Negative for polydipsia.    PHYSICAL EXAM: Blood pressure 129/83, pulse 78, temperature 98.3 F (36.8 C), temperature source Oral, height 5\' 5"  (1.651 m), weight 210 lb (95.3 kg), SpO2 99 %. Body mass index is 34.95 kg/m. Physical Exam  Constitutional: She is oriented to person, place, and time. She appears well-developed and well-nourished.  Cardiovascular: Normal rate.  Pulmonary/Chest: Effort normal.  Musculoskeletal: Normal range of motion.  Neurological: She is oriented to person, place, and time.  Skin: Skin is warm and dry.  Psychiatric: She has a normal mood and affect. Her behavior is normal.  Vitals reviewed.   RECENT LABS AND TESTS: BMET    Component Value Date/Time   NA 141 04/30/2018 1042   K 4.6 04/30/2018 1042   CL 102 04/30/2018 1042   CO2 25 04/30/2018 1042    GLUCOSE 83 04/30/2018 1042   GLUCOSE 85 11/22/2016 1521   GLUCOSE 86 11/13/2009   BUN 18 04/30/2018 1042   CREATININE 0.60 04/30/2018 1042   CREATININE 0.67 11/22/2016 1521   CALCIUM 9.7 04/30/2018 1042   GFRNONAA 105 04/30/2018 1042   GFRAA 121 04/30/2018 1042   Lab Results  Component Value Date   HGBA1C 5.4 04/30/2018   HGBA1C 5.2 10/06/2017  HGBA1C 5.1 04/06/2014   Lab Results  Component Value Date   INSULIN 8.7 04/30/2018   INSULIN 8.8 10/06/2017   CBC    Component Value Date/Time   WBC 9.6 10/06/2017 0850   WBC 9.8 11/22/2016 1521   RBC 4.71 10/06/2017 0850   RBC 4.41 11/22/2016 1521   HGB 13.8 10/06/2017 0850   HCT 42.4 10/06/2017 0850   PLT 328 11/22/2016 1521   PLT 381 11/13/2009   MCV 90 10/06/2017 0850   MCH 29.3 10/06/2017 0850   MCH 29.7 11/22/2016 1521   MCHC 32.5 10/06/2017 0850   MCHC 32.2 11/22/2016 1521   RDW 13.5 10/06/2017 0850   LYMPHSABS 1.8 10/06/2017 0850   MONOABS 0.5 05/26/2015 0931   EOSABS 0.1 10/06/2017 0850   BASOSABS 0.1 10/06/2017 0850   Iron/TIBC/Ferritin/ %Sat    Component Value Date/Time   FERRITIN 50 11/09/2008   Lipid Panel     Component Value Date/Time   CHOL 160 04/30/2018 1042   TRIG 134 04/30/2018 1042   TRIG 138 11/13/2009 1337   HDL 49 04/30/2018 1042   CHOLHDL 3.3 04/30/2018 1042   CHOLHDL 3.9 11/22/2016 1521   VLDL 32 (H) 11/22/2016 1521   LDLCALC 84 04/30/2018 1042   Hepatic Function Panel     Component Value Date/Time   PROT 7.1 04/30/2018 1042   ALBUMIN 4.6 04/30/2018 1042   AST 18 04/30/2018 1042   ALT 21 04/30/2018 1042   ALKPHOS 139 (H) 04/30/2018 1042   BILITOT 0.2 04/30/2018 1042   BILIDIR 0.1 05/26/2015 0931      Component Value Date/Time   TSH 1.690 10/06/2017 0850   TSH 1.30 11/22/2016 1521   TSH 1.67 05/26/2015 0931  Results for CAMPSeirra, Kos I (MRN [id number]) as of 08/31/2018 09:59  Ref. Range 04/30/2018 10:42  Vitamin D, 25-Hydroxy Latest Ref Range: 30.0 - 100.0 ng/mL 62.7     ASSESSMENT AND PLAN: Insulin resistance - Plan: metFORMIN (GLUCOPHAGE) 500 MG tablet  Vitamin D deficiency - Plan: Vitamin D, Ergocalciferol, (DRISDOL) 50000 units CAPS capsule  At risk for diabetes mellitus  Class 2 severe obesity with serious comorbidity and body mass index (BMI) of 35.0 to 35.9 in adult, unspecified obesity type ([place])  PLAN:  Insulin Resistance Alessa will continue to work on weight loss, exercise, and decreasing simple carbohydrates in her diet to help decrease the risk of diabetes. We dicussed metformin including benefits and risks. She was informed that eating too many simple carbohydrates or too many calories at one sitting increases the likelihood of GI side effects. Khara agrees to continue taking metformin 500 mg PO q AM #30 and we will refill for 1 month. Malvina agrees to follow up with our clinic in 2 weeks as directed to monitor her progress.  Diabetes risk counselling Jonise was given extended (15 minutes) diabetes prevention counseling today. She is 52 y.o. female and has risk factors for diabetes including obesity and insulin resistance. We discussed intensive lifestyle modifications today with an emphasis on weight loss as well as increasing exercise and decreasing simple carbohydrates in her diet.  Vitamin D Deficiency Lennix was informed that low vitamin D levels contributes to fatigue and are associated with obesity, breast, and colon cancer. Jenasis agrees to continue taking prescription Vit D @50 ,000 IU every 14 days #2 and we will refill for 1 month. She will follow up for routine testing of vitamin D, at least 2-3 times per year. She was informed of the risk of over-replacement of vitamin  D and agrees to not increase her dose unless she discusses this with [name] first. Glorine agrees to follow up with our clinic in 2 weeks.  Obesity Angelette is currently in the action stage of change. As such, her goal is to continue with weight loss efforts She has  agreed to follow the Category 2 plan Taaliyah has been instructed to work up to a goal of 150 minutes of combined cardio and strengthening exercise per week for weight loss and overall health benefits. We discussed the following Behavioral Modification Strategies today: increasing lean protein intake, increasing vegetables, work on meal planning and easy cooking plans, and planning for success   Zamaya has agreed to follow up with our clinic in 2 weeks. She was informed of the importance of frequent follow up visits to maximize her success with intensive lifestyle modifications for her multiple health conditions.   OBESITY BEHAVIORAL INTERVENTION VISIT  Today's visit was # 18   Starting weight: 212 lbs Starting date: 10/06/17 Today's weight : 210 lbs  Today's date: 08/31/2018 Total lbs lost to date: 2    ASK: We discussed the diagnosis of obesity with Hebert Soho today and Royce agreed to give [name] permission to discuss obesity behavioral modification therapy today.  ASSESS: Shenaya has the diagnosis of obesity and her BMI today is 34.95 Lee-Ann is in the action stage of change   ADVISE: Felesha was educated on the multiple health risks of obesity as well as the benefit of weight loss to improve her health. She was advised of the need for long term treatment and the importance of lifestyle modifications to improve her current health and to decrease her risk of future health problems.  AGREE: Multiple dietary modification options and treatment options were discussed and  Adine agreed to follow the recommendations documented in the above note.  ARRANGE: Tambra was educated on the importance of frequent visits to treat obesity as outlined per CMS and USPSTF guidelines and agreed to schedule her next follow up appointment today.  I, Trixie Dredge, am acting as transcriptionist for Ilene Qua, MD  I have reviewed the above documentation for accuracy and completeness, and I agree  with the above. - Ilene Qua, MD

## 2018-09-21 ENCOUNTER — Ambulatory Visit (INDEPENDENT_AMBULATORY_CARE_PROVIDER_SITE_OTHER): Payer: 59 | Admitting: Family Medicine

## 2018-09-21 VITALS — BP 113/73 | HR 62 | Temp 95.1°F | Ht 65.0 in | Wt 210.0 lb

## 2018-09-21 DIAGNOSIS — E559 Vitamin D deficiency, unspecified: Secondary | ICD-10-CM

## 2018-09-21 DIAGNOSIS — Z6835 Body mass index (BMI) 35.0-35.9, adult: Secondary | ICD-10-CM | POA: Diagnosis not present

## 2018-09-21 DIAGNOSIS — E8881 Metabolic syndrome: Secondary | ICD-10-CM

## 2018-09-21 DIAGNOSIS — Z9189 Other specified personal risk factors, not elsewhere classified: Secondary | ICD-10-CM | POA: Diagnosis not present

## 2018-09-21 NOTE — Progress Notes (Signed)
Office: [phone]  /  Fax: [phone]   HPI:   Chief Complaint: OBESITY Hailey Noble is here to discuss her progress with her obesity treatment plan. She is on the  follow the Category 2 plan and is following her eating plan approximately 80 % of the time. She states she is exercising by swimming or walking for 60 minutes 7 times per week. Hailey Noble is currently struggled the last two weeks as she had to skip lunch and would snack. This week she is working at the desk at work so she will not have to skip meals.   Her weight is 210 lb (95.3 kg) today and has not lost weight since her last visit. She has lost 2 lbs since starting treatment with [name].  Vitamin D deficiency Hailey Noble has a diagnosis of vitamin D deficiency. She is currently taking vit D and denies nausea, vomiting or muscle weakness. Reports fatigue.    Ref. Range 04/30/2018 10:42  Vitamin D, 25-Hydroxy Latest Ref Range: 30.0 - 100.0 ng/mL 62.7   Insulin Resistance Hailey Noble has a diagnosis of insulin resistance based on her elevated fasting insulin level >5. Although Hailey Noble's blood glucose readings are still under good control, insulin resistance puts her at greater risk of metabolic syndrome and diabetes. She is taking metformin currently and continues to work on diet and exercise to decrease risk of diabetes. She denies GI side effects of Metformin and cravings.   At risk for diabetes Hailey Noble is at higher than averagerisk for developing diabetes due to her obesity. She currently denies polyuria or polydipsia.  ALLERGIES: Allergies  Allergen Reactions  . Coconut Fatty Acids Hives  . Codeine   . Hydrocod Polst-Cpm Polst Er     REACTION: rash on face and head, itching  . Mango Flavor Hives  . [name] Other (See Comments)  . Oxycodone-Acetaminophen     REACTION: rash on face and head, itching  . Tussin Cough [Dextromethorphan Hbr]     Broken out in hives  . Ultram [Tramadol] Other (See Comments)    Dizzy and low blood  pressure.     MEDICATIONS: Current Outpatient Medications on File Prior to Visit  Medication Sig Dispense Refill  . acetaminophen (TYLENOL) 650 MG CR tablet Take 650 mg by mouth every 8 (eight) hours as needed. pain    . Calcium Carbonate-Vit D-Min (CALCIUM 1200 PO) Take by mouth daily.      . cetirizine (ZYRTEC) 10 MG tablet Take 10 mg by mouth daily.    . Cholecalciferol (VITAMIN D-1000 MAX ST PO) Take 1 tablet by mouth.    . escitalopram (LEXAPRO) 10 MG tablet Take 1 tablet (10 mg total) by mouth daily. 90 tablet 3  . famotidine (PEPCID) 20 MG tablet Take 20 mg daily as needed by mouth.     Marland Kitchen KRILL OIL 1000 MG CAPS Take 1 capsule by mouth daily.    . metFORMIN (GLUCOPHAGE) 500 MG tablet Take 1 tablet (500 mg total) by mouth daily with breakfast. 30 tablet 0  . mometasone (NASONEX) 50 MCG/ACT nasal spray Place 2 sprays into the nose daily. 17 g 1  . Polyethyl Glycol-Propyl Glycol (SYSTANE FREE OP) Apply 1 drop to eye daily.    . Vitamin D, Ergocalciferol, (DRISDOL) 50000 units CAPS capsule Take 1 capsule (50,000 Units total) by mouth every 14 (fourteen) days. 2 capsule 0   No current facility-administered medications on file prior to visit.     PAST MEDICAL HISTORY: Past Medical History:  Diagnosis Date  .  Allergic state 12/08/2016  . Allergy   . Anxiety   . Back pain   . GERD (gastroesophageal reflux disease)   . Hyperlipidemia 06/09/2017  . Joint pain   . Knee pain, right 03/10/2017  . Plantar fasciitis   . Shortness of breath   . Sinusitis   . Vitamin D deficiency 11/22/2016  . Vitamin D deficiency     PAST SURGICAL HISTORY: Past Surgical History:  Procedure Laterality Date  . COLONOSCOPY  02-2009   with Henrene Pastor normal  . ESOPHAGOGASTRODUODENOSCOPY  2012  . GANGLION CYST EXCISION Right    right- wrist   . NASAL SEPTUM SURGERY    . NASAL SINUS SURGERY    . TMJ ARTHROSCOPY     left    SOCIAL HISTORY: Social History   Tobacco Use  . Smoking status: Never Smoker    . Smokeless tobacco: Never Used  Substance Use Topics  . Alcohol use: Yes    Alcohol/week: 0.0 standard drinks    Comment: rare  . Drug use: No    FAMILY HISTORY: Family History  Problem Relation Age of Onset  . Hypertension Father   . Diabetes Father   . Hyperlipidemia Father   . Breast cancer Mother 31  . Cancer Mother        breast  . Thyroid disease Mother   . Sleep apnea Mother   . Colon cancer Paternal Grandfather        died at age 48  . Cancer Paternal Grandfather        GI CA  . Obesity Sister   . Diabetes Sister   . Hypertension Sister   . Heart disease Maternal Grandmother        MI  . Cancer Maternal Grandfather        lung, smoker  . Diabetes Maternal Grandfather   . Diabetes Paternal Grandmother   . Heart disease Paternal Grandmother   . Pancreatitis Sister   . Esophageal cancer Neg Hx   . Stomach cancer Neg Hx   . Stroke Neg Hx   . Colon polyps Neg Hx   . Rectal cancer Neg Hx   . Pancreatic cancer Neg Hx     ROS: Review of Systems  Constitutional: Positive for malaise/fatigue. Negative for weight loss.  Gastrointestinal: Negative for nausea and vomiting.  Musculoskeletal:       Negative for muscle weakness   Endo/Heme/Allergies: Negative for polydipsia.       Negative for polyuria     PHYSICAL EXAM: Blood pressure 113/73, pulse 62, temperature (!) 95.1 F (35.1 C), temperature source Oral, height 5\' 5"  (1.651 m), weight 210 lb (95.3 kg), SpO2 97 %. Body mass index is 34.95 kg/m. Physical Exam  Constitutional: She is oriented to person, place, and time. She appears well-developed and well-nourished.  HENT:  Head: Normocephalic.  Cardiovascular: Normal rate.  Pulmonary/Chest: Effort normal.  Musculoskeletal: Normal range of motion.  Neurological: She is alert and oriented to person, place, and time.  Skin: Skin is warm and dry.  Psychiatric: She has a normal mood and affect. Her behavior is normal.  Vitals reviewed.   RECENT LABS  AND TESTS: BMET    Component Value Date/Time   NA 141 04/30/2018 1042   K 4.6 04/30/2018 1042   CL 102 04/30/2018 1042   CO2 25 04/30/2018 1042   GLUCOSE 83 04/30/2018 1042   GLUCOSE 85 11/22/2016 1521   GLUCOSE 86 11/13/2009   BUN 18 04/30/2018 1042   CREATININE  0.60 04/30/2018 1042   CREATININE 0.67 11/22/2016 1521   CALCIUM 9.7 04/30/2018 1042   GFRNONAA 105 04/30/2018 1042   GFRAA 121 04/30/2018 1042   Lab Results  Component Value Date   HGBA1C 5.4 04/30/2018   HGBA1C 5.2 10/06/2017   HGBA1C 5.1 04/06/2014   Lab Results  Component Value Date   INSULIN 8.7 04/30/2018   INSULIN 8.8 10/06/2017   CBC    Component Value Date/Time   WBC 9.6 10/06/2017 0850   WBC 9.8 11/22/2016 1521   RBC 4.71 10/06/2017 0850   RBC 4.41 11/22/2016 1521   HGB 13.8 10/06/2017 0850   HCT 42.4 10/06/2017 0850   PLT 328 11/22/2016 1521   PLT 381 11/13/2009   MCV 90 10/06/2017 0850   MCH 29.3 10/06/2017 0850   MCH 29.7 11/22/2016 1521   MCHC 32.5 10/06/2017 0850   MCHC 32.2 11/22/2016 1521   RDW 13.5 10/06/2017 0850   LYMPHSABS 1.8 10/06/2017 0850   MONOABS 0.5 05/26/2015 0931   EOSABS 0.1 10/06/2017 0850   BASOSABS 0.1 10/06/2017 0850   Iron/TIBC/Ferritin/ %Sat    Component Value Date/Time   FERRITIN 50 11/09/2008   Lipid Panel     Component Value Date/Time   CHOL 160 04/30/2018 1042   TRIG 134 04/30/2018 1042   TRIG 138 11/13/2009 1337   HDL 49 04/30/2018 1042   CHOLHDL 3.3 04/30/2018 1042   CHOLHDL 3.9 11/22/2016 1521   VLDL 32 (H) 11/22/2016 1521   LDLCALC 84 04/30/2018 1042   Hepatic Function Panel     Component Value Date/Time   PROT 7.1 04/30/2018 1042   ALBUMIN 4.6 04/30/2018 1042   AST 18 04/30/2018 1042   ALT 21 04/30/2018 1042   ALKPHOS 139 (H) 04/30/2018 1042   BILITOT 0.2 04/30/2018 1042   BILIDIR 0.1 05/26/2015 0931      Component Value Date/Time   TSH 1.690 10/06/2017 0850   TSH 1.30 11/22/2016 1521   TSH 1.67 05/26/2015 0931     Ref. Range  04/30/2018 10:42  Vitamin D, 25-Hydroxy Latest Ref Range: 30.0 - 100.0 ng/mL 62.7   ASSESSMENT AND PLAN: Vitamin D deficiency - Plan: VITAMIN D 25 Hydroxy (Vit-D Deficiency, Fractures)  Insulin resistance - Plan: Comprehensive metabolic panel, Hemoglobin A1c, Insulin, random  At risk for diabetes mellitus  Class 2 severe obesity with serious comorbidity and body mass index (BMI) of 35.0 to 35.9 in adult, unspecified obesity type (Hailey Noble)  PLAN: Vitamin D Deficiency Hailey Noble was informed that low vitamin D levels contributes to fatigue and are associated with obesity, breast, and colon cancer. She agrees to continue to take prescription Vit D @50 ,000 IU every week and will follow up for routine testing of vitamin D, at least 2-3 times per year. She was informed of the risk of over-replacement of vitamin D and agrees to not increase her dose unless she discusses this with [name] first. We will check Vitamin D level today. Agrees to follow up with our clinic as directed.   Insulin Resistance Hailey Noble will continue to work on weight loss, exercise, and decreasing simple carbohydrates in her diet to help decrease the risk of diabetes. We dicussed metformin including benefits and risks. She was informed that eating too many simple carbohydrates or too many calories at one sitting increases the likelihood of GI side effects. Hailey Noble agrees to continue Metformin. Hailey Noble agreed to follow up with [name] as directed to monitor her progress. We will check CMP, HgA1c, and insulin level today.  Diabetes risk counseling Hailey Noble was given extended (15 minutes) diabetes prevention counseling today. She is 52 y.o. female and has risk factors for diabetes including obesity. We discussed intensive lifestyle modifications today with an emphasis on weight loss as well as increasing exercise and decreasing simple carbohydrates in her diet.  Obesity  Hailey Noble is currently in the action stage of change. As such, her goal is to continue  with weight loss efforts She has agreed to follow the Category 2 plan Hailey Noble has been instructed to work up to a goal of 150 minutes of combined cardio and strengthening exercise per week for weight loss and overall health benefits. We discussed the following Behavioral Modification Strategies today: increasing lean protein intake, increasing vegetables, planning for success, and work on meal planning and easy cooking plans   Sharne has agreed to follow up with our clinic in 2 weeks. She was informed of the importance of frequent follow up visits to maximize her success with intensive lifestyle modifications for her multiple health conditions.   OBESITY BEHAVIORAL INTERVENTION VISIT  Today's visit was # 19   Starting weight: 212 lb Starting date: 10/06/17 Today's weight : 210 lb Today's date: 09/21/2018 Total lbs lost to date: 2 lb    ASK: We discussed the diagnosis of obesity with Hailey Noble today and Hailey Noble agreed to give [name] permission to discuss obesity behavioral modification therapy today.  ASSESS: Hailey Noble has the diagnosis of obesity and her BMI today is 34.95 Hailey Noble is in the action stage of change   ADVISE: Hailey Noble was educated on the multiple health risks of obesity as well as the benefit of weight loss to improve her health. She was advised of the need for long term treatment and the importance of lifestyle modifications to improve her current health and to decrease her risk of future health problems.  AGREE: Multiple dietary modification options and treatment options were discussed and  Hailey Noble agreed to follow the recommendations documented in the above note.  ARRANGE: Hailey Noble was educated on the importance of frequent visits to treat obesity as outlined per CMS and USPSTF guidelines and agreed to schedule her next follow up appointment today.  I, Hailey Noble, am acting as transcriptionist for Ilene Qua, MD   I have reviewed the above documentation for  accuracy and completeness, and I agree with the above. - Ilene Qua, MD

## 2018-09-22 LAB — COMPREHENSIVE METABOLIC PANEL
ALBUMIN: 4.4 g/dL (ref 3.5–5.5)
ALK PHOS: 130 IU/L — AB (ref 39–117)
ALT: 21 IU/L (ref 0–32)
AST: 21 IU/L (ref 0–40)
Albumin/Globulin Ratio: 1.9 (ref 1.2–2.2)
BILIRUBIN TOTAL: 0.3 mg/dL (ref 0.0–1.2)
BUN / CREAT RATIO: 31 — AB (ref 9–23)
BUN: 20 mg/dL (ref 6–24)
CHLORIDE: 102 mmol/L (ref 96–106)
CO2: 23 mmol/L (ref 20–29)
Calcium: 9 mg/dL (ref 8.7–10.2)
Creatinine, Ser: 0.64 mg/dL (ref 0.57–1.00)
GFR calc non Af Amer: 102 mL/min/{1.73_m2} (ref >59)
GFR, EST AFRICAN AMERICAN: 118 mL/min/{1.73_m2} (ref >59)
GLUCOSE: 88 mg/dL (ref 65–99)
Globulin, Total: 2.3 g/dL (ref 1.5–4.5)
Potassium: 4.4 mmol/L (ref 3.5–5.2)
Sodium: 141 mmol/L (ref 134–144)
TOTAL PROTEIN: 6.7 g/dL (ref 6.0–8.5)

## 2018-09-22 LAB — HEMOGLOBIN A1C
Est. average glucose Bld gHb Est-mCnc: 108 mg/dL
HEMOGLOBIN A1C: 5.4 % (ref 4.8–5.6)

## 2018-09-22 LAB — INSULIN, RANDOM: INSULIN: 11.1 u[IU]/mL (ref 2.6–24.9)

## 2018-09-22 LAB — VITAMIN D 25 HYDROXY (VIT D DEFICIENCY, FRACTURES): Vit D, 25-Hydroxy: 53.8 ng/mL (ref 30.0–100.0)

## 2018-09-28 ENCOUNTER — Other Ambulatory Visit (INDEPENDENT_AMBULATORY_CARE_PROVIDER_SITE_OTHER): Payer: Self-pay | Admitting: Family Medicine

## 2018-09-28 DIAGNOSIS — E8881 Metabolic syndrome: Secondary | ICD-10-CM

## 2018-09-28 MED FILL — ESCITALOPRAM 10 MG TABLET: 10 | 90 days supply | Qty: 90 | Fill #3

## 2018-09-28 MED FILL — metFORMIN HCL 500 MG TABS: 500 | 7 days supply | Qty: 14 | Fill #0

## 2018-10-06 DIAGNOSIS — M216X1 Other acquired deformities of right foot: Secondary | ICD-10-CM | POA: Diagnosis not present

## 2018-10-06 DIAGNOSIS — M21962 Unspecified acquired deformity of left lower leg: Secondary | ICD-10-CM | POA: Diagnosis not present

## 2018-10-06 DIAGNOSIS — M21961 Unspecified acquired deformity of right lower leg: Secondary | ICD-10-CM | POA: Diagnosis not present

## 2018-10-07 ENCOUNTER — Ambulatory Visit (INDEPENDENT_AMBULATORY_CARE_PROVIDER_SITE_OTHER): Payer: 59 | Admitting: Family Medicine

## 2018-10-07 VITALS — BP 137/57 | HR 78 | Temp 97.6°F | Ht 65.0 in | Wt 210.0 lb

## 2018-10-07 DIAGNOSIS — E8881 Metabolic syndrome: Secondary | ICD-10-CM | POA: Diagnosis not present

## 2018-10-07 DIAGNOSIS — Z9189 Other specified personal risk factors, not elsewhere classified: Secondary | ICD-10-CM | POA: Diagnosis not present

## 2018-10-07 DIAGNOSIS — Z6835 Body mass index (BMI) 35.0-35.9, adult: Secondary | ICD-10-CM | POA: Diagnosis not present

## 2018-10-07 DIAGNOSIS — E88819 Insulin resistance, unspecified: Secondary | ICD-10-CM

## 2018-10-07 DIAGNOSIS — E559 Vitamin D deficiency, unspecified: Secondary | ICD-10-CM

## 2018-10-07 MED ORDER — VITAMIN D (ERGOCALCIFEROL) 1.25 MG (50000 UNIT) PO CAPS: 50000.0000 [IU] | ORAL_CAPSULE | ORAL | 0 refills | Status: DC

## 2018-10-08 NOTE — Progress Notes (Signed)
Office: [phone]  /  Fax: [phone]   HPI:   Chief Complaint: OBESITY Hailey Noble is here to discuss her progress with her obesity treatment plan. She is on the Category 2 plan and is following her eating plan approximately 85 % of the time. She states she is walking or swimming 45 minutes 3 times per week. [name] starts a new job on Monday. She has plans to stay on Category 2 and she denies hunger. Her snacks include greek yogurt.  Her weight is 210 lb (95.3 kg) today and has not lost weight since her last visit. She has lost 2 lbs since starting treatment with [name].  Vitamin D deficiency [name] has a diagnosis of vitamin D deficiency. She is currently taking vit D. She admits fatigue and denies nausea, vomiting or muscle weakness.  At risk for osteopenia and osteoporosis [name] is at higher risk of osteopenia and osteoporosis due to vitamin D deficiency.   Insulin Resistance [name] has a diagnosis of insulin resistance based on her elevated fasting insulin level >5. Although [name]'s blood glucose readings are still under good control, insulin resistance puts her at greater risk of metabolic syndrome and diabetes. She is taking metformin currently and denies GI side effects. She continues to work on diet and exercise to decrease risk of diabetes.  ALLERGIES: Allergies  Allergen Reactions  . Coconut Fatty Acids Hives  . Codeine   . Hydrocod Polst-Cpm Polst Er     REACTION: rash on face and head, itching  . Mango Flavor Hives  . [name] Other (See Comments)  . Oxycodone-Acetaminophen     REACTION: rash on face and head, itching  . Tussin Cough [Dextromethorphan Hbr]     Broken out in hives  . Ultram [Tramadol] Other (See Comments)    Dizzy and low blood pressure.     MEDICATIONS: Current Outpatient Medications on File Prior to Visit  Medication Sig Dispense Refill  . acetaminophen (TYLENOL) 650 MG CR tablet Take 650 mg by mouth every 8 (eight) hours as needed. pain    .  Calcium Carbonate-Vit D-Min (CALCIUM 1200 PO) Take by mouth daily.      . cetirizine (ZYRTEC) 10 MG tablet Take 10 mg by mouth daily.    . Cholecalciferol (VITAMIN D-1000 MAX ST PO) Take 1 tablet by mouth.    . escitalopram (LEXAPRO) 10 MG tablet Take 1 tablet (10 mg total) by mouth daily. 90 tablet 3  . famotidine (PEPCID) 20 MG tablet Take 20 mg daily as needed by mouth.     Marland Kitchen KRILL OIL 1000 MG CAPS Take 1 capsule by mouth daily.    . metFORMIN (GLUCOPHAGE) 500 MG tablet TAKE 1 TABLET (500 MG TOTAL) BY MOUTH DAILY WITH BREAKFAST. 14 tablet 0  . mometasone (NASONEX) 50 MCG/ACT nasal spray Place 2 sprays into the nose daily. 17 g 1  . Polyethyl Glycol-Propyl Glycol (SYSTANE FREE OP) Apply 1 drop to eye daily.     No current facility-administered medications on file prior to visit.     PAST MEDICAL HISTORY: Past Medical History:  Diagnosis Date  . Allergic state 12/08/2016  . Allergy   . Anxiety   . Back pain   . GERD (gastroesophageal reflux disease)   . Hyperlipidemia 06/09/2017  . Joint pain   . Knee pain, right 03/10/2017  . Plantar fasciitis   . Shortness of breath   . Sinusitis   . Vitamin D deficiency 11/22/2016  . Vitamin D deficiency  PAST SURGICAL HISTORY: Past Surgical History:  Procedure Laterality Date  . COLONOSCOPY  02-2009   with Henrene Pastor normal  . ESOPHAGOGASTRODUODENOSCOPY  2012  . GANGLION CYST EXCISION Right    right- wrist   . NASAL SEPTUM SURGERY    . NASAL SINUS SURGERY    . TMJ ARTHROSCOPY     left    SOCIAL HISTORY: Social History   Tobacco Use  . Smoking status: Never Smoker  . Smokeless tobacco: Never Used  Substance Use Topics  . Alcohol use: Yes    Alcohol/week: 0.0 standard drinks    Comment: rare  . Drug use: No    FAMILY HISTORY: Family History  Problem Relation Age of Onset  . Hypertension Father   . Diabetes Father   . Hyperlipidemia Father   . Breast cancer Mother 49  . Cancer Mother        breast  . Thyroid disease  Mother   . Sleep apnea Mother   . Colon cancer Paternal Grandfather        died at age 49  . Cancer Paternal Grandfather        GI CA  . Obesity Sister   . Diabetes Sister   . Hypertension Sister   . Heart disease Maternal Grandmother        MI  . Cancer Maternal Grandfather        lung, smoker  . Diabetes Maternal Grandfather   . Diabetes Paternal Grandmother   . Heart disease Paternal Grandmother   . Pancreatitis Sister   . Esophageal cancer Neg Hx   . Stomach cancer Neg Hx   . Stroke Neg Hx   . Colon polyps Neg Hx   . Rectal cancer Neg Hx   . Pancreatic cancer Neg Hx     ROS: Review of Systems  Constitutional: Positive for malaise/fatigue. Negative for weight loss.  Gastrointestinal: Negative for nausea and vomiting.  Musculoskeletal:       Negative for muscle weakness.    PHYSICAL EXAM: Blood pressure (!) 137/57, pulse 78, temperature 97.6 F (36.4 C), temperature source Oral, height 5\' 5"  (1.651 m), weight 210 lb (95.3 kg), SpO2 95 %. Body mass index is 34.95 kg/m. Physical Exam  Constitutional: She is oriented to person, place, and time. She appears well-developed and well-nourished.  Cardiovascular: Normal rate.  Pulmonary/Chest: Effort normal.  Musculoskeletal: Normal range of motion.  Neurological: She is oriented to person, place, and time.  Skin: Skin is warm and dry.  Psychiatric: She has a normal mood and affect. Her behavior is normal.  Vitals reviewed.   RECENT LABS AND TESTS: BMET    Component Value Date/Time   NA 141 09/21/2018 0827   K 4.4 09/21/2018 0827   CL 102 09/21/2018 0827   CO2 23 09/21/2018 0827   GLUCOSE 88 09/21/2018 0827   GLUCOSE 85 11/22/2016 1521   GLUCOSE 86 11/13/2009   BUN 20 09/21/2018 0827   CREATININE 0.64 09/21/2018 0827   CREATININE 0.67 11/22/2016 1521   CALCIUM 9.0 09/21/2018 0827   GFRNONAA 102 09/21/2018 0827   GFRAA 118 09/21/2018 0827   Lab Results  Component Value Date   HGBA1C 5.4 09/21/2018    HGBA1C 5.4 04/30/2018   HGBA1C 5.2 10/06/2017   HGBA1C 5.1 04/06/2014   Lab Results  Component Value Date   INSULIN 11.1 09/21/2018   INSULIN 8.7 04/30/2018   INSULIN 8.8 10/06/2017   CBC    Component Value Date/Time   WBC 9.6 10/06/2017  0850   WBC 9.8 11/22/2016 1521   RBC 4.71 10/06/2017 0850   RBC 4.41 11/22/2016 1521   HGB 13.8 10/06/2017 0850   HCT 42.4 10/06/2017 0850   PLT 328 11/22/2016 1521   PLT 381 11/13/2009   MCV 90 10/06/2017 0850   MCH 29.3 10/06/2017 0850   MCH 29.7 11/22/2016 1521   MCHC 32.5 10/06/2017 0850   MCHC 32.2 11/22/2016 1521   RDW 13.5 10/06/2017 0850   LYMPHSABS 1.8 10/06/2017 0850   MONOABS 0.5 05/26/2015 0931   EOSABS 0.1 10/06/2017 0850   BASOSABS 0.1 10/06/2017 0850   Iron/TIBC/Ferritin/ %Sat    Component Value Date/Time   FERRITIN 50 11/09/2008   Lipid Panel     Component Value Date/Time   CHOL 160 04/30/2018 1042   TRIG 134 04/30/2018 1042   TRIG 138 11/13/2009 1337   HDL 49 04/30/2018 1042   CHOLHDL 3.3 04/30/2018 1042   CHOLHDL 3.9 11/22/2016 1521   VLDL 32 (H) 11/22/2016 1521   LDLCALC 84 04/30/2018 1042   Hepatic Function Panel     Component Value Date/Time   PROT 6.7 09/21/2018 0827   ALBUMIN 4.4 09/21/2018 0827   AST 21 09/21/2018 0827   ALT 21 09/21/2018 0827   ALKPHOS 130 (H) 09/21/2018 0827   BILITOT 0.3 09/21/2018 0827   BILIDIR 0.1 05/26/2015 0931      Component Value Date/Time   TSH 1.690 10/06/2017 0850   TSH 1.30 11/22/2016 1521   TSH 1.67 05/26/2015 0931   Results for LYLEE, CORROW I (MRN [id number]) as of 10/08/2018 12:39  Ref. Range 09/21/2018 08:27  Vitamin D, 25-Hydroxy Latest Ref Range: 30.0 - 100.0 ng/mL 53.8   ASSESSMENT AND PLAN: Vitamin D deficiency - Plan: Vitamin D, Ergocalciferol, (DRISDOL) 50000 units CAPS capsule  Insulin resistance  At risk for osteoporosis  Class 2 severe obesity with serious comorbidity and body mass index (BMI) of 35.0 to 35.9 in adult, unspecified obesity  type ([place])  PLAN:  Vitamin D Deficiency Joliet was informed that low vitamin D levels contributes to fatigue and are associated with obesity, breast, and colon cancer. She agrees to continue to take prescription Vit D @50 ,000 IU every week #4 with no refills and will follow up for routine testing of vitamin D, at least 2-3 times per year. She was informed of the risk of over-replacement of vitamin D and agrees to not increase her dose unless she discusses this with [name] first. Ceriah agrees to follow up in 2 weeks.  At risk for osteopenia and osteoporosis Aleka was given extended (15 minutes) osteoporosis prevention counseling today. Doriann is at risk for osteopenia and osteoporosis due to her vitamin D deficiency. She was encouraged to take her vitamin D and follow her higher calcium diet and increase strengthening exercise to help strengthen her bones and decrease her risk of osteopenia and osteoporosis.  Insulin Resistance Walker will continue to work on weight loss, exercise, and decreasing simple carbohydrates in her diet to help decrease the risk of diabetes. She was informed that eating too many simple carbohydrates or too many calories at one sitting increases the likelihood of GI side effects. Lesle agreed to continue metformin and prescription was not needed today. Jadda agreed to follow up with [name] as directed to monitor her progress in 2 weeks.  Obesity Jessilyn is currently in the action stage of change. As such, her goal is to continue with weight loss efforts. She has agreed to follow the Category 2 plan. Erline Levine  has been instructed to work up to a goal of 150 minutes of combined cardio and strengthening exercise per week for weight loss and overall health benefits. We discussed the following Behavioral Modification Strategies today: increasing lean protein intake, increasing vegetables, work on meal planning and easy cooking plans, and planning for success.  Skyrah has agreed to  follow up with our clinic in 2 weeks. She was informed of the importance of frequent follow up visits to maximize her success with intensive lifestyle modifications for her multiple health conditions.   OBESITY BEHAVIORAL INTERVENTION VISIT  Today's visit was # 20   Starting weight: 212  Starting date: 10/06/17 Today's weight : Weight: 210 lb (95.3 kg)  Today's date: 10/07/2018 Total lbs lost to date: 2  ASK: We discussed the diagnosis of obesity with Hebert Soho today and Shyla agreed to give [name] permission to discuss obesity behavioral modification therapy today.  ASSESS: Quantia has the diagnosis of obesity and her BMI today is 34.95. Shalanda is in the action stage of change.   ADVISE: Erika was educated on the multiple health risks of obesity as well as the benefit of weight loss to improve her health. She was advised of the need for long term treatment and the importance of lifestyle modifications to improve her current health and to decrease her risk of future health problems.  AGREE: Multiple dietary modification options and treatment options were discussed and Braylen agreed to follow the recommendations documented in the above note.  ARRANGE: Maurya was educated on the importance of frequent visits to treat obesity as outlined per CMS and USPSTF guidelines and agreed to schedule her next follow up appointment today.  I, Marcille Blanco, am acting as Location manager for Eber [name], MD  I have reviewed the above documentation for accuracy and completeness, and I agree with the above. - Ilene Qua, MD

## 2018-10-09 MED FILL — VIT D2 1.25 MG (50,000 UNIT: 1.25 MG | 28 days supply | Qty: 2 | Fill #0

## 2018-10-20 ENCOUNTER — Other Ambulatory Visit (INDEPENDENT_AMBULATORY_CARE_PROVIDER_SITE_OTHER): Payer: Self-pay | Admitting: Family Medicine

## 2018-10-20 ENCOUNTER — Telehealth (INDEPENDENT_AMBULATORY_CARE_PROVIDER_SITE_OTHER): Payer: Self-pay | Admitting: Family Medicine

## 2018-10-20 DIAGNOSIS — E8881 Metabolic syndrome: Secondary | ICD-10-CM

## 2018-10-20 NOTE — Telephone Encounter (Signed)
Pt needs refill on Glucophage - completely out; has appt 10/31. drh

## 2018-10-22 ENCOUNTER — Encounter (INDEPENDENT_AMBULATORY_CARE_PROVIDER_SITE_OTHER): Payer: Self-pay | Admitting: Physician Assistant

## 2018-10-22 ENCOUNTER — Ambulatory Visit (INDEPENDENT_AMBULATORY_CARE_PROVIDER_SITE_OTHER): Payer: 59 | Admitting: Physician Assistant

## 2018-10-22 VITALS — BP 121/75 | HR 88 | Temp 98.1°F | Ht 65.0 in | Wt 212.0 lb

## 2018-10-22 DIAGNOSIS — Z6835 Body mass index (BMI) 35.0-35.9, adult: Secondary | ICD-10-CM

## 2018-10-22 DIAGNOSIS — Z9189 Other specified personal risk factors, not elsewhere classified: Secondary | ICD-10-CM

## 2018-10-22 DIAGNOSIS — E559 Vitamin D deficiency, unspecified: Secondary | ICD-10-CM

## 2018-10-22 DIAGNOSIS — E8881 Metabolic syndrome: Secondary | ICD-10-CM | POA: Diagnosis not present

## 2018-10-22 MED ORDER — METFORMIN HCL 500 MG PO TABS: 500.0000 mg | ORAL_TABLET | Freq: Every day | ORAL | 0 refills | Status: DC

## 2018-10-22 MED FILL — metFORMIN HCL 500 MG TABS: 500 | 30 days supply | Qty: 30 | Fill #0

## 2018-10-22 NOTE — Telephone Encounter (Signed)
Patient's prescription was sent to her pharmacy today. April, [place]

## 2018-10-24 NOTE — Progress Notes (Signed)
Office: [phone]  /  Fax: [phone]   HPI:   Chief Complaint: OBESITY Hailey Noble is here to discuss her progress with her obesity treatment plan. She is on the Category 2 plan and is following her eating plan approximately 85 % of the time. She states she is walking and lifting weights for 60 minutes 3 times per week. 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 She is struggling with avoiding some of the food choices available at that time.  Her weight is 212 lb (96.2 kg) today and has gained 2 pounds since her last visit. She has lost 0 lbs since starting treatment with [name].  Insulin Resistance Hailey Noble has a diagnosis of insulin resistance based on her elevated fasting insulin level >5. Although Hailey Noble's blood glucose readings are still under good control, insulin resistance puts her at greater risk of metabolic syndrome and diabetes. She is taking metformin currently and continues to work on diet and exercise to decrease risk of diabetes. She denies polyphagia.  Vitamin D Deficiency Hailey Noble has a diagnosis of vitamin D deficiency. She is currently taking prescription Vit D and denies nausea, vomiting or muscle weakness.  At risk for osteopenia and osteoporosis Hailey Noble is at higher risk of osteopenia and osteoporosis due to vitamin D deficiency.   ALLERGIES: Allergies  Allergen Reactions  . Coconut Fatty Acids Hives  . Codeine   . Hydrocod Polst-Cpm Polst Er     REACTION: rash on face and head, itching  . Mango Flavor Hives  . [name] Other (See Comments)  . Oxycodone-Acetaminophen     REACTION: rash on face and head, itching  . Tussin Cough [Dextromethorphan Hbr]     Broken out in hives  . Ultram [Tramadol] Other (See Comments)    Dizzy and low blood pressure.     MEDICATIONS: Current Outpatient Medications on File Prior to Visit  Medication Sig Dispense Refill  . acetaminophen (TYLENOL) 650 MG CR tablet Take  650 mg by mouth every 8 (eight) hours as needed. pain    . Calcium Carbonate-Vit D-Min (CALCIUM 1200 PO) Take by mouth daily.      . cetirizine (ZYRTEC) 10 MG tablet Take 10 mg by mouth daily.    . Cholecalciferol (VITAMIN D-1000 MAX ST PO) Take 1 tablet by mouth.    . escitalopram (LEXAPRO) 10 MG tablet Take 1 tablet (10 mg total) by mouth daily. 90 tablet 3  . famotidine (PEPCID) 20 MG tablet Take 20 mg daily as needed by mouth.     Marland Kitchen KRILL OIL 1000 MG CAPS Take 1 capsule by mouth daily.    . mometasone (NASONEX) 50 MCG/ACT nasal spray Place 2 sprays into the nose daily. 17 g 1  . Polyethyl Glycol-Propyl Glycol (SYSTANE FREE OP) Apply 1 drop to eye daily.    . Vitamin D, Ergocalciferol, (DRISDOL) 50000 units CAPS capsule Take 1 capsule (50,000 Units total) by mouth every 14 (fourteen) days. 2 capsule 0   No current facility-administered medications on file prior to visit.     PAST MEDICAL HISTORY: Past Medical History:  Diagnosis Date  . Allergic state 12/08/2016  . Allergy   . Anxiety   . Back pain   . GERD (gastroesophageal reflux disease)   . Hyperlipidemia 06/09/2017  . Joint pain   . Knee pain, right 03/10/2017  . Plantar fasciitis   . Shortness of breath   . Sinusitis   . Vitamin D deficiency  11/22/2016  . Vitamin D deficiency     PAST SURGICAL HISTORY: Past Surgical History:  Procedure Laterality Date  . COLONOSCOPY  02-2009   with Henrene Pastor normal  . ESOPHAGOGASTRODUODENOSCOPY  2012  . GANGLION CYST EXCISION Right    right- wrist   . NASAL SEPTUM SURGERY    . NASAL SINUS SURGERY    . TMJ ARTHROSCOPY     left    SOCIAL HISTORY: Social History   Tobacco Use  . Smoking status: Never Smoker  . Smokeless tobacco: Never Used  Substance Use Topics  . Alcohol use: Yes    Alcohol/week: 0.0 standard drinks    Comment: rare  . Drug use: No    FAMILY HISTORY: Family History  Problem Relation Age of Onset  . Hypertension Father   . Diabetes Father   .  Hyperlipidemia Father   . Breast cancer Mother 12  . Cancer Mother        breast  . Thyroid disease Mother   . Sleep apnea Mother   . Colon cancer Paternal Grandfather        died at age 10  . Cancer Paternal Grandfather        GI CA  . Obesity Sister   . Diabetes Sister   . Hypertension Sister   . Heart disease Maternal Grandmother        MI  . Cancer Maternal Grandfather        lung, smoker  . Diabetes Maternal Grandfather   . Diabetes Paternal Grandmother   . Heart disease Paternal Grandmother   . Pancreatitis Sister   . Esophageal cancer Neg Hx   . Stomach cancer Neg Hx   . Stroke Neg Hx   . Colon polyps Neg Hx   . Rectal cancer Neg Hx   . Pancreatic cancer Neg Hx     ROS: Review of Systems  Constitutional: Negative for weight loss.  Gastrointestinal: Negative for nausea and vomiting.  Musculoskeletal:       Negative muscle weakness  Endo/Heme/Allergies:       Negative polyphagia    PHYSICAL EXAM: Blood pressure 121/75, pulse 88, temperature 98.1 F (36.7 C), temperature source Oral, height 5\' 5"  (1.651 m), weight 212 lb (96.2 kg), SpO2 98 %. Body mass index is 35.28 kg/m. Physical Exam  Constitutional: She is oriented to person, place, and time. She appears well-developed and well-nourished.  Cardiovascular: Normal rate.  Pulmonary/Chest: Effort normal.  Musculoskeletal: Normal range of motion.  Neurological: She is oriented to person, place, and time.  Skin: Skin is warm and dry.  Psychiatric: She has a normal mood and affect. Her behavior is normal.  Vitals reviewed.   RECENT LABS AND TESTS: BMET    Component Value Date/Time   NA 141 09/21/2018 0827   K 4.4 09/21/2018 0827   CL 102 09/21/2018 0827   CO2 23 09/21/2018 0827   GLUCOSE 88 09/21/2018 0827   GLUCOSE 85 11/22/2016 1521   GLUCOSE 86 11/13/2009   BUN 20 09/21/2018 0827   CREATININE 0.64 09/21/2018 0827   CREATININE 0.67 11/22/2016 1521   CALCIUM 9.0 09/21/2018 0827   GFRNONAA 102  09/21/2018 0827   GFRAA 118 09/21/2018 0827   Lab Results  Component Value Date   HGBA1C 5.4 09/21/2018   HGBA1C 5.4 04/30/2018   HGBA1C 5.2 10/06/2017   HGBA1C 5.1 04/06/2014   Lab Results  Component Value Date   INSULIN 11.1 09/21/2018   INSULIN 8.7 04/30/2018   INSULIN 8.8  10/06/2017   CBC    Component Value Date/Time   WBC 9.6 10/06/2017 0850   WBC 9.8 11/22/2016 1521   RBC 4.71 10/06/2017 0850   RBC 4.41 11/22/2016 1521   HGB 13.8 10/06/2017 0850   HCT 42.4 10/06/2017 0850   PLT 328 11/22/2016 1521   PLT 381 11/13/2009   MCV 90 10/06/2017 0850   MCH 29.3 10/06/2017 0850   MCH 29.7 11/22/2016 1521   MCHC 32.5 10/06/2017 0850   MCHC 32.2 11/22/2016 1521   RDW 13.5 10/06/2017 0850   LYMPHSABS 1.8 10/06/2017 0850   MONOABS 0.5 05/26/2015 0931   EOSABS 0.1 10/06/2017 0850   BASOSABS 0.1 10/06/2017 0850   Iron/TIBC/Ferritin/ %Sat    Component Value Date/Time   FERRITIN 50 11/09/2008   Lipid Panel     Component Value Date/Time   CHOL 160 04/30/2018 1042   TRIG 134 04/30/2018 1042   TRIG 138 11/13/2009 1337   HDL 49 04/30/2018 1042   CHOLHDL 3.3 04/30/2018 1042   CHOLHDL 3.9 11/22/2016 1521   VLDL 32 (H) 11/22/2016 1521   LDLCALC 84 04/30/2018 1042   Hepatic Function Panel     Component Value Date/Time   PROT 6.7 09/21/2018 0827   ALBUMIN 4.4 09/21/2018 0827   AST 21 09/21/2018 0827   ALT 21 09/21/2018 0827   ALKPHOS 130 (H) 09/21/2018 0827   BILITOT 0.3 09/21/2018 0827   BILIDIR 0.1 05/26/2015 0931      Component Value Date/Time   TSH 1.690 10/06/2017 0850   TSH 1.30 11/22/2016 1521   TSH 1.67 05/26/2015 0931  Results for MIRELY, PANGLE I (MRN [id number]) as of 10/24/2018 19:55  Ref. Range 09/21/2018 08:27  Vitamin D, 25-Hydroxy Latest Ref Range: 30.0 - 100.0 ng/mL 53.8    ASSESSMENT AND PLAN: Insulin resistance - Plan: metFORMIN (GLUCOPHAGE) 500 MG tablet  Vitamin D deficiency  At risk for osteoporosis  Class 2 severe obesity with serious  comorbidity and body mass index (BMI) of 35.0 to 35.9 in adult, unspecified obesity type ([place])  PLAN:  Insulin Resistance Lorece will continue to work on weight loss, exercise, and decreasing simple carbohydrates in her diet to help decrease the risk of diabetes. We dicussed metformin including benefits and risks. She was informed that eating too many simple carbohydrates or too many calories at one sitting increases the likelihood of GI side effects. Shamia agrees to continue taking metformin 500 mg q AM #30 and we will refill for 1 month. Kierstin agrees to follow up with our clinic in 2 weeks as directed to monitor her progress.  Vitamin D Deficiency Kameko was informed that low vitamin D levels contributes to fatigue and are associated with obesity, breast, and colon cancer. Claudina agrees to continue taking prescription Vit D @50 ,000 IU every 14 days and will follow up for routine testing of vitamin D, at least 2-3 times per year. She was informed of the risk of over-replacement of vitamin D and agrees to not increase her dose unless she discusses this with [name] first. Elgene agrees to follow up with our clinic in 2 weeks.  At risk for osteopenia and osteoporosis Laiza was given extended (15 minutes) osteoporosis prevention counseling today. Quiara is at risk for osteopenia and osteoporsis due to her vitamin D deficiency. She was encouraged to take her vitamin D and follow her higher calcium diet and increase strengthening exercise to help strengthen her bones and decrease her risk of osteopenia and osteoporosis.  Obesity Tashaya is currently in  the action stage of change. As such, her goal is to continue with weight loss efforts She has agreed to follow the Category 2 plan Sharlene has been instructed to work up to a goal of 150 minutes of combined cardio and strengthening exercise per week for weight loss and overall health benefits. We discussed the following Behavioral Modification Strategies  today: work on meal planning and easy cooking plans and better snacking choices   Eliya has agreed to follow up with our clinic in 2 weeks. She was informed of the importance of frequent follow up visits to maximize her success with intensive lifestyle modifications for her multiple health conditions.   OBESITY BEHAVIORAL INTERVENTION VISIT  Today's visit was # 21   Starting weight: 212 lbs Starting date: 10/06/17 Today's weight : 212 lbs  Today's date: 10/22/2018 Total lbs lost to date: 0    ASK: We discussed the diagnosis of obesity with Hebert Soho today and Loretto agreed to give [name] permission to discuss obesity behavioral modification therapy today.  ASSESS: Karem has the diagnosis of obesity and her BMI today is 35.28 Nesa is in the action stage of change   ADVISE: Yumna was educated on the multiple health risks of obesity as well as the benefit of weight loss to improve her health. She was advised of the need for long term treatment and the importance of lifestyle modifications.  AGREE: Multiple dietary modification options and treatment options were discussed and  Kamerin agreed to the above obesity treatment plan.  Wilhemena Durie, am acting as transcriptionist for Abby Potash, PA-C I, Abby Potash, PA-C have reviewed above note and agree with its content

## 2018-11-10 ENCOUNTER — Ambulatory Visit (INDEPENDENT_AMBULATORY_CARE_PROVIDER_SITE_OTHER): Payer: 59 | Admitting: Physician Assistant

## 2018-11-10 VITALS — BP 134/80 | HR 72 | Temp 98.0°F | Ht 65.0 in | Wt 211.0 lb

## 2018-11-10 DIAGNOSIS — Z6835 Body mass index (BMI) 35.0-35.9, adult: Secondary | ICD-10-CM

## 2018-11-10 DIAGNOSIS — Z9189 Other specified personal risk factors, not elsewhere classified: Secondary | ICD-10-CM | POA: Diagnosis not present

## 2018-11-10 DIAGNOSIS — E559 Vitamin D deficiency, unspecified: Secondary | ICD-10-CM

## 2018-11-10 DIAGNOSIS — E8881 Metabolic syndrome: Secondary | ICD-10-CM

## 2018-11-10 MED ORDER — METFORMIN HCL 500 MG PO TABS: 500.0000 mg | ORAL_TABLET | Freq: Every day | ORAL | 0 refills | Status: DC

## 2018-11-10 MED ORDER — VITAMIN D (ERGOCALCIFEROL) 1.25 MG (50000 UNIT) PO CAPS: 50000.0000 [IU] | ORAL_CAPSULE | ORAL | 0 refills | Status: DC

## 2018-11-10 MED FILL — VIT D2 1.25 MG (50,000 UNIT: 1.25 MG | 28 days supply | Qty: 2 | Fill #0

## 2018-11-12 NOTE — Progress Notes (Signed)
Office: [phone]  /  Fax: [phone]   HPI:   Chief Complaint: OBESITY Hailey Noble is here to discuss her progress with her obesity treatment plan. She is on the Category 2 plan and is following her eating plan approximately 90 % of the time. She states she is walking and swimming for 45-60 minutes 3 times per week. Hailey Noble did well with weight loss. She reports having cravings for blue cheese and grapefruit. She already has her plan for Thanksgiving eating.  Her weight is 211 lb (95.7 kg) today and has had a weight loss of 1 pound over a period of 2 to 3 weeks since her last visit. She has lost 1 lb since starting treatment with [name].  Vitamin D Deficiency Hailey Noble has a diagnosis of vitamin D deficiency. She is currently taking prescription Vit D and denies nausea, vomiting or muscle weakness.  At risk for osteopenia and osteoporosis Hailey Noble is at higher risk of osteopenia and osteoporosis due to vitamin D deficiency.   Insulin Resistance Hailey Noble has a diagnosis of insulin resistance based on her elevated fasting insulin level >5. Although Hailey Noble's blood glucose readings are still under good control, insulin resistance puts her at greater risk of metabolic syndrome and diabetes. She denies polyphagia and she reports cravings for grapefruit and blue cheese. She is taking metformin currently and continues to work on diet and exercise to decrease risk of diabetes.  ALLERGIES: Allergies  Allergen Reactions  . Coconut Fatty Acids Hives  . Codeine   . Hydrocod Polst-Cpm Polst Er     REACTION: rash on face and head, itching  . Mango Flavor Hives  . [name] Other (See Comments)  . Oxycodone-Acetaminophen     REACTION: rash on face and head, itching  . Tussin Cough [Dextromethorphan Hbr]     Broken out in hives  . Ultram [Tramadol] Other (See Comments)    Dizzy and low blood pressure.     MEDICATIONS: Current Outpatient Medications on File Prior to Visit  Medication Sig Dispense  Refill  . acetaminophen (TYLENOL) 650 MG CR tablet Take 650 mg by mouth every 8 (eight) hours as needed. pain    . Calcium Carbonate-Vit D-Min (CALCIUM 1200 PO) Take by mouth daily.      . cetirizine (ZYRTEC) 10 MG tablet Take 10 mg by mouth daily.    . Cholecalciferol (VITAMIN D-1000 MAX ST PO) Take 1 tablet by mouth.    . escitalopram (LEXAPRO) 10 MG tablet Take 1 tablet (10 mg total) by mouth daily. 90 tablet 3  . famotidine (PEPCID) 20 MG tablet Take 20 mg daily as needed by mouth.     Marland Kitchen KRILL OIL 1000 MG CAPS Take 1 capsule by mouth daily.    . mometasone (NASONEX) 50 MCG/ACT nasal spray Place 2 sprays into the nose daily. 17 g 1  . Polyethyl Glycol-Propyl Glycol (SYSTANE FREE OP) Apply 1 drop to eye daily.     No current facility-administered medications on file prior to visit.     PAST MEDICAL HISTORY: Past Medical History:  Diagnosis Date  . Allergic state 12/08/2016  . Allergy   . Anxiety   . Back pain   . GERD (gastroesophageal reflux disease)   . Hyperlipidemia 06/09/2017  . Joint pain   . Knee pain, right 03/10/2017  . Plantar fasciitis   . Shortness of breath   . Sinusitis   . Vitamin D deficiency 11/22/2016  . Vitamin D deficiency     PAST SURGICAL HISTORY: Past  Surgical History:  Procedure Laterality Date  . COLONOSCOPY  02-2009   with Henrene Pastor normal  . ESOPHAGOGASTRODUODENOSCOPY  2012  . GANGLION CYST EXCISION Right    right- wrist   . NASAL SEPTUM SURGERY    . NASAL SINUS SURGERY    . TMJ ARTHROSCOPY     left    SOCIAL HISTORY: Social History   Tobacco Use  . Smoking status: Never Smoker  . Smokeless tobacco: Never Used  Substance Use Topics  . Alcohol use: Yes    Alcohol/week: 0.0 standard drinks    Comment: rare  . Drug use: No    FAMILY HISTORY: Family History  Problem Relation Age of Onset  . Hypertension Father   . Diabetes Father   . Hyperlipidemia Father   . Breast cancer Mother 42  . Cancer Mother        breast  . Thyroid disease  Mother   . Sleep apnea Mother   . Colon cancer Paternal Grandfather        died at age 77  . Cancer Paternal Grandfather        GI CA  . Obesity Sister   . Diabetes Sister   . Hypertension Sister   . Heart disease Maternal Grandmother        MI  . Cancer Maternal Grandfather        lung, smoker  . Diabetes Maternal Grandfather   . Diabetes Paternal Grandmother   . Heart disease Paternal Grandmother   . Pancreatitis Sister   . Esophageal cancer Neg Hx   . Stomach cancer Neg Hx   . Stroke Neg Hx   . Colon polyps Neg Hx   . Rectal cancer Neg Hx   . Pancreatic cancer Neg Hx     ROS: Review of Systems  Constitutional: Positive for weight loss.  Gastrointestinal: Negative for nausea and vomiting.  Musculoskeletal:       Negative muscle weakness  Endo/Heme/Allergies:       Positive polyphagia    PHYSICAL EXAM: Blood pressure 134/80, pulse 72, temperature 98 F (36.7 C), temperature source Oral, height 5\' 5"  (1.651 m), weight 211 lb (95.7 kg), SpO2 96 %. Body mass index is 35.11 kg/m. Physical Exam  Constitutional: She is oriented to person, place, and time. She appears well-developed and well-nourished.  Cardiovascular: Normal rate.  Pulmonary/Chest: Effort normal.  Musculoskeletal: Normal range of motion.  Neurological: She is oriented to person, place, and time.  Skin: Skin is warm and dry.  Psychiatric: She has a normal mood and affect. Her behavior is normal.  Vitals reviewed.   RECENT LABS AND TESTS: BMET    Component Value Date/Time   NA 141 09/21/2018 0827   K 4.4 09/21/2018 0827   CL 102 09/21/2018 0827   CO2 23 09/21/2018 0827   GLUCOSE 88 09/21/2018 0827   GLUCOSE 85 11/22/2016 1521   GLUCOSE 86 11/13/2009   BUN 20 09/21/2018 0827   CREATININE 0.64 09/21/2018 0827   CREATININE 0.67 11/22/2016 1521   CALCIUM 9.0 09/21/2018 0827   GFRNONAA 102 09/21/2018 0827   GFRAA 118 09/21/2018 0827   Lab Results  Component Value Date   HGBA1C 5.4 09/21/2018    HGBA1C 5.4 04/30/2018   HGBA1C 5.2 10/06/2017   HGBA1C 5.1 04/06/2014   Lab Results  Component Value Date   INSULIN 11.1 09/21/2018   INSULIN 8.7 04/30/2018   INSULIN 8.8 10/06/2017   CBC    Component Value Date/Time   WBC 9.6  10/06/2017 0850   WBC 9.8 11/22/2016 1521   RBC 4.71 10/06/2017 0850   RBC 4.41 11/22/2016 1521   HGB 13.8 10/06/2017 0850   HCT 42.4 10/06/2017 0850   PLT 328 11/22/2016 1521   PLT 381 11/13/2009   MCV 90 10/06/2017 0850   MCH 29.3 10/06/2017 0850   MCH 29.7 11/22/2016 1521   MCHC 32.5 10/06/2017 0850   MCHC 32.2 11/22/2016 1521   RDW 13.5 10/06/2017 0850   LYMPHSABS 1.8 10/06/2017 0850   MONOABS 0.5 05/26/2015 0931   EOSABS 0.1 10/06/2017 0850   BASOSABS 0.1 10/06/2017 0850   Iron/TIBC/Ferritin/ %Sat    Component Value Date/Time   FERRITIN 50 11/09/2008   Lipid Panel     Component Value Date/Time   CHOL 160 04/30/2018 1042   TRIG 134 04/30/2018 1042   TRIG 138 11/13/2009 1337   HDL 49 04/30/2018 1042   CHOLHDL 3.3 04/30/2018 1042   CHOLHDL 3.9 11/22/2016 1521   VLDL 32 (H) 11/22/2016 1521   LDLCALC 84 04/30/2018 1042   Hepatic Function Panel     Component Value Date/Time   PROT 6.7 09/21/2018 0827   ALBUMIN 4.4 09/21/2018 0827   AST 21 09/21/2018 0827   ALT 21 09/21/2018 0827   ALKPHOS 130 (H) 09/21/2018 0827   BILITOT 0.3 09/21/2018 0827   BILIDIR 0.1 05/26/2015 0931      Component Value Date/Time   TSH 1.690 10/06/2017 0850   TSH 1.30 11/22/2016 1521   TSH 1.67 05/26/2015 0931  Results for CATHARINA, PICA I (MRN [id number]) as of 11/12/2018 16:12  Ref. Range 09/21/2018 08:27  Vitamin D, 25-Hydroxy Latest Ref Range: 30.0 - 100.0 ng/mL 53.8    ASSESSMENT AND PLAN: Vitamin D deficiency - Plan: Vitamin D, Ergocalciferol, (DRISDOL) 1.25 MG (50000 UT) CAPS capsule  Insulin resistance - Plan: metFORMIN (GLUCOPHAGE) 500 MG tablet  At risk for osteoporosis  Class 2 severe obesity with serious comorbidity and body mass index  (BMI) of 35.0 to 35.9 in adult, unspecified obesity type ([place])  PLAN:  Vitamin D Deficiency Hailey Noble was informed that low vitamin D levels contributes to fatigue and are associated with obesity, breast, and colon cancer. Shonice agrees to continue taking prescription Vit D @50 ,000 IU every 14 days #2 and we will refill for 1 month. She will follow up for routine testing of vitamin D, at least 2-3 times per year. She was informed of the risk of over-replacement of vitamin D and agrees to not increase her dose unless she discusses this with [name] first. Catlynn agrees to follow up with our clinic in 3 weeks.  At risk for osteopenia and osteoporosis Hailey Noble was given extended (15 minutes) osteoporosis prevention counseling today. Hailey Noble is at risk for osteopenia and osteoporsis due to her vitamin D deficiency. She was encouraged to take her vitamin D and follow her higher calcium diet and increase strengthening exercise to help strengthen her bones and decrease her risk of osteopenia and osteoporosis.  Insulin Resistance Hailey Noble will continue to work on weight loss, exercise, and decreasing simple carbohydrates in her diet to help decrease the risk of diabetes. We dicussed metformin including benefits and risks. She was informed that eating too many simple carbohydrates or too many calories at one sitting increases the likelihood of GI side effects. Hailey Noble agrees to continue taking metformin 500 mg q AM #30 and we will refill for 1 month. Hailey Noble agrees to follow up with our clinic in 3 weeks as directed to monitor her progress.  Obesity Hailey Noble is currently in the action stage of change. As such, her goal is to continue with weight loss efforts She has agreed to keep a food journal with 400-500 calories and 35 grams of protein at supper daily and follow the Category 2 plan Hailey Noble has been instructed to work up to a goal of 150 minutes of combined cardio and strengthening exercise per week for weight loss and  overall health benefits. We discussed the following Behavioral Modification Strategies today: work on meal planning and easy cooking plans   Hailey Noble has agreed to follow up with our clinic in 3 weeks. She was informed of the importance of frequent follow up visits to maximize her success with intensive lifestyle modifications for her multiple health conditions.   OBESITY BEHAVIORAL INTERVENTION VISIT  Today's visit was # 22  Starting weight: 212 lbs Starting date: 10/06/17 Today's weight : 211 lbs  Today's date: 11/10/2018 Total lbs lost to date: 1    ASK: We discussed the diagnosis of obesity with Hailey Noble today and Hailey Noble agreed to give [name] permission to discuss obesity behavioral modification therapy today.  ASSESS: Hailey Noble has the diagnosis of obesity and her BMI today is 35.11 Hailey Noble is in the action stage of change   ADVISE: Hailey Noble was educated on the multiple health risks of obesity as well as the benefit of weight loss to improve her health. She was advised of the need for long term treatment and the importance of lifestyle modifications.  AGREE: Multiple dietary modification options and treatment options were discussed and  Hailey Noble agreed to the above obesity treatment plan.  Wilhemena Durie, am acting as transcriptionist for Abby Potash, PA-C I, Abby Potash, PA-C have reviewed above note and agree with its content

## 2018-11-18 MED FILL — metFORMIN HCL 500 MG TABS: 500 | 30 days supply | Qty: 30 | Fill #0

## 2018-11-30 ENCOUNTER — Ambulatory Visit (INDEPENDENT_AMBULATORY_CARE_PROVIDER_SITE_OTHER): Payer: 59 | Admitting: Family Medicine

## 2018-11-30 ENCOUNTER — Encounter: Payer: Self-pay | Admitting: Family Medicine

## 2018-11-30 DIAGNOSIS — E559 Vitamin D deficiency, unspecified: Secondary | ICD-10-CM

## 2018-11-30 DIAGNOSIS — Z0000 Encounter for general adult medical examination without abnormal findings: Secondary | ICD-10-CM | POA: Diagnosis not present

## 2018-11-30 DIAGNOSIS — F418 Other specified anxiety disorders: Secondary | ICD-10-CM | POA: Diagnosis not present

## 2018-11-30 DIAGNOSIS — E669 Obesity, unspecified: Secondary | ICD-10-CM

## 2018-11-30 DIAGNOSIS — E7849 Other hyperlipidemia: Secondary | ICD-10-CM | POA: Diagnosis not present

## 2018-11-30 MED ORDER — MOMETASONE FUROATE 50 MCG/ACT NA SUSP: 2.0000 | Freq: Every day | NASAL | 6 refills | Status: DC

## 2018-11-30 MED FILL — MOMETASONE FUROATE 50 MCG S: 50 | 30 days supply | Qty: 17 | Fill #0

## 2018-11-30 NOTE — Assessment & Plan Note (Signed)
Encouraged heart healthy diet, increase exercise, avoid trans fats, consider a krill oil cap daily 

## 2018-11-30 NOTE — Progress Notes (Signed)
Subjective:    Patient ID: Hailey Noble, female    DOB: 03-Jul-1966, 52 y.o.   MRN: [id number]  No chief complaint on file.   HPI Patient is in today for annual preventative exam and follow up on anxiety,  Hyperlipidemia, reflux and she is doing well. No recent febrile illness or hospitalization. She is following with healthy weight and wellness and is feeling better since she moved her job to this building. Is trying to maintain a heart healthy diet and stay active. No acute concerns. Her ankles do continue to bother her at the end of the day but topical meds like Biofreeze are helpful. Denies CP/palp/SOB/HA/congestion/fevers/GI or GU c/o. Taking meds as prescribed  Past Medical History:  Diagnosis Date  . Allergic state 12/08/2016  . Allergy   . Anxiety   . Back pain   . GERD (gastroesophageal reflux disease)   . Hyperlipidemia 06/09/2017  . Joint pain   . Knee pain, right 03/10/2017  . Plantar fasciitis   . Shortness of breath   . Sinusitis   . Vitamin D deficiency 11/22/2016  . Vitamin D deficiency     Past Surgical History:  Procedure Laterality Date  . COLONOSCOPY  02-2009   with Henrene Pastor normal  . ESOPHAGOGASTRODUODENOSCOPY  2012  . GANGLION CYST EXCISION Right    right- wrist   . NASAL SEPTUM SURGERY    . NASAL SINUS SURGERY    . TMJ ARTHROSCOPY     left    Family History  Problem Relation Age of Onset  . Hypertension Father   . Diabetes Father   . Hyperlipidemia Father   . Breast cancer Mother 58  . Cancer Mother        breast  . Thyroid disease Mother   . Sleep apnea Mother   . Colon cancer Paternal Grandfather        died at age 75  . Cancer Paternal Grandfather        GI CA  . Obesity Sister   . Diabetes Sister   . Hypertension Sister   . Heart disease Maternal Grandmother        MI  . Cancer Maternal Grandfather        lung, smoker  . Diabetes Maternal Grandfather   . Diabetes Paternal Grandmother   . Heart disease Paternal Grandmother   .  Pancreatitis Sister   . Esophageal cancer Neg Hx   . Stomach cancer Neg Hx   . Stroke Neg Hx   . Colon polyps Neg Hx   . Rectal cancer Neg Hx   . Pancreatic cancer Neg Hx     Social History   Socioeconomic History  . Marital status: Single    Spouse name: Not on file  . Number of children: Not on file  . Years of education: Not on file  . Highest education level: Not on file  Occupational History  . Occupation: Programmer, multimedia: [employer]  . Financial resource strain: Not on file  . Food insecurity:    Worry: Not on file    Inability: Not on file  . Transportation needs:    Medical: Not on file    Non-medical: Not on file  Tobacco Use  . Smoking status: Never Smoker  . Smokeless tobacco: Never Used  Substance and Sexual Activity  . Alcohol use: Yes    Alcohol/week: 0.0 standard drinks    Comment: rare  . Drug use: No  .  Sexual activity: Yes    Birth control/protection: IUD  Lifestyle  . Physical activity:    Days per week: Not on file    Minutes per session: Not on file  . Stress: Not on file  Relationships  . Social connections:    Talks on phone: Not on file    Gets together: Not on file    Attends religious service: Not on file    Active member of club or organization: Not on file    Attends meetings of clubs or organizations: Not on file    Relationship status: Not on file  . Intimate partner violence:    Fear of current or ex partner: Not on file    Emotionally abused: Not on file    Physically abused: Not on file    Forced sexual activity: Not on file  Other Topics Concern  . 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 Works at [employer]. No dietary restrictions. Lives with dog    Outpatient Medications Prior to Visit  Medication Sig Dispense Refill  . acetaminophen (TYLENOL) 650 MG CR tablet Take 650 mg by mouth every 8 (eight) hours as needed. pain    .  Calcium Carbonate-Vit D-Min (CALCIUM 1200 PO) Take by mouth daily.      . cetirizine (ZYRTEC) 10 MG tablet Take 10 mg by mouth daily.    . Cholecalciferol (VITAMIN D-1000 MAX ST PO) Take 1 tablet by mouth.    . escitalopram (LEXAPRO) 10 MG tablet Take 1 tablet (10 mg total) by mouth daily. 90 tablet 3  . famotidine (PEPCID) 20 MG tablet Take 20 mg daily as needed by mouth.     Marland Kitchen KRILL OIL 1000 MG CAPS Take 1 capsule by mouth daily.    . metFORMIN (GLUCOPHAGE) 500 MG tablet Take 1 tablet (500 mg total) by mouth daily with breakfast. 30 tablet 0  . Polyethyl Glycol-Propyl Glycol (SYSTANE FREE OP) Apply 1 drop to eye daily.    . Vitamin D, Ergocalciferol, (DRISDOL) 1.25 MG (50000 UT) CAPS capsule Take 1 capsule (50,000 Units total) by mouth every 14 (fourteen) days. 2 capsule 0  . mometasone (NASONEX) 50 MCG/ACT nasal spray Place 2 sprays into the nose daily. 17 g 1   No facility-administered medications prior to visit.     Allergies  Allergen Reactions  . Coconut Fatty Acids Hives  . Codeine   . Hydrocod Polst-Cpm Polst Er     REACTION: rash on face and head, itching  . Mango Flavor Hives  . [name] Other (See Comments)  . Oxycodone-Acetaminophen     REACTION: rash on face and head, itching  . Tussin Cough [Dextromethorphan Hbr]     Broken out in hives  . Ultram [Tramadol] Other (See Comments)    Dizzy and low blood pressure.     Review of Systems  Constitutional: Negative for chills, fever and malaise/fatigue.  HENT: Negative for congestion and hearing loss.   Eyes: Negative for discharge.  Respiratory: Negative for cough, sputum production and shortness of breath.   Cardiovascular: Negative for chest pain, palpitations and leg swelling.  Gastrointestinal: Negative for abdominal pain, blood in stool, constipation, diarrhea, heartburn, nausea and vomiting.  Genitourinary: Negative for dysuria, frequency, hematuria and urgency.  Musculoskeletal: Negative for back pain, falls and  myalgias.  Skin: Negative for rash.  Neurological: Negative for dizziness, sensory change, loss of consciousness, weakness and headaches.  Endo/Heme/Allergies:  Negative for environmental allergies. Does not bruise/bleed easily.  Psychiatric/Behavioral: Negative for depression and suicidal ideas. The patient is not nervous/anxious and does not have insomnia.        Objective:    Physical Exam  Constitutional: She is oriented to person, place, and time. She appears well-developed and well-nourished. No distress.  HENT:  Head: Normocephalic and atraumatic.  Eyes: Conjunctivae are normal.  Neck: Neck supple. No thyromegaly present.  Cardiovascular: Normal rate, regular rhythm and normal heart sounds.  No murmur heard. Pulmonary/Chest: Effort normal and breath sounds normal. No respiratory distress.  Abdominal: Soft. Bowel sounds are normal. She exhibits no distension and no mass. There is no tenderness.  Musculoskeletal: She exhibits no edema.  Lymphadenopathy:    She has no cervical adenopathy.  Neurological: She is alert and oriented to person, place, and time.  Skin: Skin is warm and dry.  Psychiatric: She has a normal mood and affect. Her behavior is normal.    BP 122/86 (BP Location: Left Arm, Patient Position: Sitting, Cuff Size: Normal)   Pulse 77   Temp 97.9 F (36.6 C) (Oral)   Resp 18   Ht 5\' 5"  (1.651 m)   Wt 216 lb 12.8 oz (98.3 kg)   SpO2 97%   BMI 36.08 kg/m  Wt Readings from Last 3 Encounters:  11/30/18 216 lb 12.8 oz (98.3 kg)  11/10/18 211 lb (95.7 kg)  10/22/18 212 lb (96.2 kg)     Lab Results  Component Value Date   WBC 9.6 10/06/2017   HGB 13.8 10/06/2017   HCT 42.4 10/06/2017   PLT 328 11/22/2016   GLUCOSE 88 09/21/2018   CHOL 160 04/30/2018   TRIG 134 04/30/2018   HDL 49 04/30/2018   LDLCALC 84 04/30/2018   ALT 21 09/21/2018   AST 21 09/21/2018   NA 141 09/21/2018   K 4.4 09/21/2018   CL 102 09/21/2018   CREATININE 0.64 09/21/2018   BUN  20 09/21/2018   CO2 23 09/21/2018   TSH 1.690 10/06/2017   HGBA1C 5.4 09/21/2018    Lab Results  Component Value Date   TSH 1.690 10/06/2017   Lab Results  Component Value Date   WBC 9.6 10/06/2017   HGB 13.8 10/06/2017   HCT 42.4 10/06/2017   MCV 90 10/06/2017   PLT 328 11/22/2016   Lab Results  Component Value Date   NA 141 09/21/2018   K 4.4 09/21/2018   CO2 23 09/21/2018   GLUCOSE 88 09/21/2018   BUN 20 09/21/2018   CREATININE 0.64 09/21/2018   BILITOT 0.3 09/21/2018   ALKPHOS 130 (H) 09/21/2018   AST 21 09/21/2018   ALT 21 09/21/2018   PROT 6.7 09/21/2018   ALBUMIN 4.4 09/21/2018   CALCIUM 9.0 09/21/2018   GFR 112.44 05/26/2015   Lab Results  Component Value Date   CHOL 160 04/30/2018   Lab Results  Component Value Date   HDL 49 04/30/2018   Lab Results  Component Value Date   LDLCALC 84 04/30/2018   Lab Results  Component Value Date   TRIG 134 04/30/2018   Lab Results  Component Value Date   CHOLHDL 3.3 04/30/2018   Lab Results  Component Value Date   HGBA1C 5.4 09/21/2018       Assessment & Plan:   Problem List Items Addressed This Visit    Depression with anxiety    Doing well on Lexapro and with change in job from [facility] to here and doing well  Obesity (BMI 30-39.9)    Encouraged DASH diet, decrease po intake and increase exercise as tolerated. Needs 7-8 hours of sleep nightly. Avoid trans fats, eat small, frequent meals every 4-5 hours with lean proteins, complex carbs and healthy fats. Minimize simple carbs, following with Dr Leafy Ro and doing      Vitamin D deficiency    Supplement and monitor      Hyperlipidemia    Encouraged heart healthy diet, increase exercise, avoid trans fats, consider a krill oil cap daily         I am having Shakina I. Hanford maintain her Calcium Carbonate-Vit D-Min (CALCIUM 1200 PO), acetaminophen, Polyethyl Glycol-Propyl Glycol (SYSTANE FREE OP), Krill Oil, cetirizine, famotidine, Cholecalciferol  (VITAMIN D-1000 MAX ST PO), escitalopram, Vitamin D (Ergocalciferol), metFORMIN, and mometasone.  Meds ordered this encounter  Medications  . mometasone (NASONEX) 50 MCG/ACT nasal spray    Sig: Place 2 sprays into the nose daily.    Dispense:  17 g    Refill:  6     Penni Homans, MD

## 2018-11-30 NOTE — Patient Instructions (Signed)
Preventive Care 40-64 Years, Female Preventive care refers to lifestyle choices and visits with your health care provider that can promote health and wellness. What does preventive care include?  A yearly physical exam. This is also called an annual well check.  Dental exams once or twice a year.  Routine eye exams. Ask your health care provider how often you should have your eyes checked.  Personal lifestyle choices, including: ? Daily care of your teeth and gums. ? Regular physical activity. ? Eating a healthy diet. ? Avoiding tobacco and drug use. ? Limiting alcohol use. ? Practicing safe sex. ? Taking low-dose aspirin daily starting at age 58. ? Taking vitamin and mineral supplements as recommended by your health care provider. What happens during an annual well check? The services and screenings done by your health care provider during your annual well check will depend on your age, overall health, lifestyle risk factors, and family history of disease. Counseling Your health care provider may ask you questions about your:  Alcohol use.  Tobacco use.  Drug use.  Emotional well-being.  Home and relationship well-being.  Sexual activity.  Eating habits.  Work and work Statistician.  Method of birth control.  Menstrual cycle.  Pregnancy history.  Screening You may have the following tests or measurements:  Height, weight, and BMI.  Blood pressure.  Lipid and cholesterol levels. These may be checked every 5 years, or more frequently if you are over 81 years old.  Skin check.  Lung cancer screening. You may have this screening every year starting at age 78 if you have a 30-pack-year history of smoking and currently smoke or have quit within the past 15 years.  Fecal occult blood test (FOBT) of the stool. You may have this test every year starting at age 65.  Flexible sigmoidoscopy or colonoscopy. You may have a sigmoidoscopy every 5 years or a colonoscopy  every 10 years starting at age 30.  Hepatitis C blood test.  Hepatitis B blood test.  Sexually transmitted disease (STD) testing.  Diabetes screening. This is done by checking your blood sugar (glucose) after you have not eaten for a while (fasting). You may have this done every 1-3 years.  Mammogram. This may be done every 1-2 years. Talk to your health care provider about when you should start having regular mammograms. This may depend on whether you have a family history of breast cancer.  BRCA-related cancer screening. This may be done if you have a family history of breast, ovarian, tubal, or peritoneal cancers.  Pelvic exam and Pap test. This may be done every 3 years starting at age 80. Starting at age 36, this may be done every 5 years if you have a Pap test in combination with an HPV test.  Bone density scan. This is done to screen for osteoporosis. You may have this scan if you are at high risk for osteoporosis.  Discuss your test results, treatment options, and if necessary, the need for more tests with your health care provider. Vaccines Your health care provider may recommend certain vaccines, such as:  Influenza vaccine. This is recommended every year.  Tetanus, diphtheria, and acellular pertussis (Tdap, Td) vaccine. You may need a Td booster every 10 years.  Varicella vaccine. You may need this if you have not been vaccinated.  Zoster vaccine. You may need this after age 5.  Measles, mumps, and rubella (MMR) vaccine. You may need at least one dose of MMR if you were born in  1957 or later. You may also need a second dose.  Pneumococcal 13-valent conjugate (PCV13) vaccine. You may need this if you have certain conditions and were not previously vaccinated.  Pneumococcal polysaccharide (PPSV23) vaccine. You may need one or two doses if you smoke cigarettes or if you have certain conditions.  Meningococcal vaccine. You may need this if you have certain  conditions.  Hepatitis A vaccine. You may need this if you have certain conditions or if you travel or work in places where you may be exposed to hepatitis A.  Hepatitis B vaccine. You may need this if you have certain conditions or if you travel or work in places where you may be exposed to hepatitis B.  Haemophilus influenzae type b (Hib) vaccine. You may need this if you have certain conditions.  Talk to your health care provider about which screenings and vaccines you need and how often you need them. This information is not intended to replace advice given to you by your health care provider. Make sure you discuss any questions you have with your health care provider. Document Released: 01/05/2016 Document Revised: 08/28/2016 Document Reviewed: 10/10/2015 Elsevier Interactive Patient Education  2018 [employer] Inc.  

## 2018-11-30 NOTE — Assessment & Plan Note (Signed)
Doing well on Lexapro and with change in job from [facility] to here and doing well

## 2018-11-30 NOTE — Assessment & Plan Note (Signed)
Patient encouraged to maintain heart healthy diet, regular exercise, adequate sleep. Consider daily probiotics. Take medications as prescribed. Follows with OB requested last pap, MM and colonoscopy up to date. Immunizations up to date. Requested shot record from employee health. ACP documents brought in today for scanning

## 2018-11-30 NOTE — Assessment & Plan Note (Signed)
Encouraged DASH diet, decrease po intake and increase exercise as tolerated. Needs 7-8 hours of sleep nightly. Avoid trans fats, eat small, frequent meals every 4-5 hours with lean proteins, complex carbs and healthy fats. Minimize simple carbs, following with Dr Leafy Ro and doing

## 2018-11-30 NOTE — Assessment & Plan Note (Signed)
Supplement and monitor 

## 2018-12-02 ENCOUNTER — Encounter (INDEPENDENT_AMBULATORY_CARE_PROVIDER_SITE_OTHER): Payer: Self-pay | Admitting: Physician Assistant

## 2018-12-02 ENCOUNTER — Ambulatory Visit (INDEPENDENT_AMBULATORY_CARE_PROVIDER_SITE_OTHER): Payer: 59 | Admitting: Physician Assistant

## 2018-12-02 VITALS — BP 129/68 | HR 78 | Temp 98.1°F | Ht 65.0 in | Wt 212.0 lb

## 2018-12-02 DIAGNOSIS — Z6835 Body mass index (BMI) 35.0-35.9, adult: Secondary | ICD-10-CM

## 2018-12-02 DIAGNOSIS — E88819 Insulin resistance, unspecified: Secondary | ICD-10-CM

## 2018-12-02 DIAGNOSIS — Z9189 Other specified personal risk factors, not elsewhere classified: Secondary | ICD-10-CM

## 2018-12-02 DIAGNOSIS — E559 Vitamin D deficiency, unspecified: Secondary | ICD-10-CM

## 2018-12-02 DIAGNOSIS — E8881 Metabolic syndrome: Secondary | ICD-10-CM | POA: Diagnosis not present

## 2018-12-02 MED ORDER — VITAMIN D (ERGOCALCIFEROL) 1.25 MG (50000 UNIT) PO CAPS: 50000.0000 [IU] | ORAL_CAPSULE | ORAL | 0 refills | Status: DC

## 2018-12-02 MED ORDER — METFORMIN HCL 500 MG PO TABS: 500.0000 mg | ORAL_TABLET | Freq: Every day | ORAL | 0 refills | Status: DC

## 2018-12-02 MED FILL — VIT D2 1.25 MG (50,000 UNIT: 1.25 MG | 28 days supply | Qty: 2 | Fill #0

## 2018-12-02 NOTE — Progress Notes (Signed)
Office: [phone]  /  Fax: [phone]   HPI:   Chief Complaint: OBESITY Hailey Noble is here to discuss her progress with her obesity treatment plan. She is on the keep a food journal with 400-500 calories and 35 grams of protein at supper daily and follow the Category 2 plan and is following her eating plan approximately 85 % of the time. She states she is swimming and on the elliptical for 30-45 minutes 2 times per week. 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 She is ready to get back on track. Her weight is 212 lb (96.2 kg) today and has gained 1 pound since her last visit. She has lost 0 lbs since starting treatment with [name].  Insulin Resistance Hailey Noble has a diagnosis of insulin resistance based on her elevated fasting insulin level >5. Although Hailey Noble's blood glucose readings are still under good control, insulin resistance puts her at greater risk of metabolic syndrome and diabetes. She is on metformin and denies nausea, vomiting, or diarrhea. She continues to work on diet and exercise to decrease risk of diabetes. She denies polyphagia or hypoglycemia.  Vitamin D Deficiency Hailey Noble has a diagnosis of vitamin D deficiency. She is currently taking prescription Vit D and denies nausea, vomiting or muscle weakness.  At risk for osteopenia and osteoporosis Hailey Noble is at higher risk of osteopenia and osteoporosis due to vitamin D deficiency.   ALLERGIES: Allergies  Allergen Reactions  . Coconut Fatty Acids Hives  . Codeine   . Hydrocod Polst-Cpm Polst Er     REACTION: rash on face and head, itching  . Mango Flavor Hives  . [name] Other (See Comments)  . Oxycodone-Acetaminophen     REACTION: rash on face and head, itching  . Tussin Cough [Dextromethorphan Hbr]     Broken out in hives  . Ultram [Tramadol] Other (See Comments)    Dizzy and low blood pressure.     MEDICATIONS: Current Outpatient Medications on  File Prior to Visit  Medication Sig Dispense Refill  . acetaminophen (TYLENOL) 650 MG CR tablet Take 650 mg by mouth every 8 (eight) hours as needed. pain    . Calcium Carbonate-Vit D-Min (CALCIUM 1200 PO) Take by mouth daily.      . cetirizine (ZYRTEC) 10 MG tablet Take 10 mg by mouth daily.    . Cholecalciferol (VITAMIN D-1000 MAX ST PO) Take 1 tablet by mouth.    . escitalopram (LEXAPRO) 10 MG tablet Take 1 tablet (10 mg total) by mouth daily. 90 tablet 3  . famotidine (PEPCID) 20 MG tablet Take 20 mg daily as needed by mouth.     Marland Kitchen KRILL OIL 1000 MG CAPS Take 1 capsule by mouth daily.    . mometasone (NASONEX) 50 MCG/ACT nasal spray Place 2 sprays into the nose daily. 17 g 6  . Polyethyl Glycol-Propyl Glycol (SYSTANE FREE OP) Apply 1 drop to eye daily.     No current facility-administered medications on file prior to visit.     PAST MEDICAL HISTORY: Past Medical History:  Diagnosis Date  . Allergic state 12/08/2016  . Allergy   . Anxiety   . Back pain   . GERD (gastroesophageal reflux disease)   . Hyperlipidemia 06/09/2017  . Joint pain   . Knee pain, right 03/10/2017  . Plantar fasciitis   . Shortness of breath   . Sinusitis   . Vitamin D deficiency 11/22/2016  . Vitamin D deficiency  PAST SURGICAL HISTORY: Past Surgical History:  Procedure Laterality Date  . COLONOSCOPY  02-2009   with Henrene Pastor normal  . ESOPHAGOGASTRODUODENOSCOPY  2012  . GANGLION CYST EXCISION Right    right- wrist   . NASAL SEPTUM SURGERY    . NASAL SINUS SURGERY    . TMJ ARTHROSCOPY     left    SOCIAL HISTORY: Social History   Tobacco Use  . Smoking status: Never Smoker  . Smokeless tobacco: Never Used  Substance Use Topics  . Alcohol use: Yes    Alcohol/week: 0.0 standard drinks    Comment: rare  . Drug use: No    FAMILY HISTORY: Family History  Problem Relation Age of Onset  . Hypertension Father   . Diabetes Father   . Hyperlipidemia Father   . Breast cancer Mother 44  .  Cancer Mother        breast  . Thyroid disease Mother   . Sleep apnea Mother   . Colon cancer Paternal Grandfather        died at age 78  . Cancer Paternal Grandfather        GI CA  . Obesity Sister   . Diabetes Sister   . Hypertension Sister   . Heart disease Maternal Grandmother        MI  . Cancer Maternal Grandfather        lung, smoker  . Diabetes Maternal Grandfather   . Diabetes Paternal Grandmother   . Heart disease Paternal Grandmother   . Pancreatitis Sister   . Esophageal cancer Neg Hx   . Stomach cancer Neg Hx   . Stroke Neg Hx   . Colon polyps Neg Hx   . Rectal cancer Neg Hx   . Pancreatic cancer Neg Hx     ROS: Review of Systems  Constitutional: Negative for weight loss.  Gastrointestinal: Negative for diarrhea, nausea and vomiting.  Musculoskeletal:       Negative muscle weakness  Endo/Heme/Allergies:       Negative polyphagia Negative hypoglycemia    PHYSICAL EXAM: Blood pressure 129/68, pulse 78, temperature 98.1 F (36.7 C), temperature source Oral, height 5\' 5"  (1.651 m), weight 212 lb (96.2 kg), SpO2 94 %. Body mass index is 35.28 kg/m. Physical Exam  Constitutional: She is oriented to person, place, and time. She appears well-developed and well-nourished.  Cardiovascular: Normal rate.  Pulmonary/Chest: Effort normal.  Musculoskeletal: Normal range of motion.  Neurological: She is oriented to person, place, and time.  Skin: Skin is warm and dry.  Psychiatric: She has a normal mood and affect. Her behavior is normal.  Vitals reviewed.   RECENT LABS AND TESTS: BMET    Component Value Date/Time   NA 141 09/21/2018 0827   K 4.4 09/21/2018 0827   CL 102 09/21/2018 0827   CO2 23 09/21/2018 0827   GLUCOSE 88 09/21/2018 0827   GLUCOSE 85 11/22/2016 1521   GLUCOSE 86 11/13/2009   BUN 20 09/21/2018 0827   CREATININE 0.64 09/21/2018 0827   CREATININE 0.67 11/22/2016 1521   CALCIUM 9.0 09/21/2018 0827   GFRNONAA 102 09/21/2018 0827   GFRAA  118 09/21/2018 0827   Lab Results  Component Value Date   HGBA1C 5.4 09/21/2018   HGBA1C 5.4 04/30/2018   HGBA1C 5.2 10/06/2017   HGBA1C 5.1 04/06/2014   Lab Results  Component Value Date   INSULIN 11.1 09/21/2018   INSULIN 8.7 04/30/2018   INSULIN 8.8 10/06/2017   CBC  Component Value Date/Time   WBC 9.6 10/06/2017 0850   WBC 9.8 11/22/2016 1521   RBC 4.71 10/06/2017 0850   RBC 4.41 11/22/2016 1521   HGB 13.8 10/06/2017 0850   HCT 42.4 10/06/2017 0850   PLT 328 11/22/2016 1521   PLT 381 11/13/2009   MCV 90 10/06/2017 0850   MCH 29.3 10/06/2017 0850   MCH 29.7 11/22/2016 1521   MCHC 32.5 10/06/2017 0850   MCHC 32.2 11/22/2016 1521   RDW 13.5 10/06/2017 0850   LYMPHSABS 1.8 10/06/2017 0850   MONOABS 0.5 05/26/2015 0931   EOSABS 0.1 10/06/2017 0850   BASOSABS 0.1 10/06/2017 0850   Iron/TIBC/Ferritin/ %Sat    Component Value Date/Time   FERRITIN 50 11/09/2008   Lipid Panel     Component Value Date/Time   CHOL 160 04/30/2018 1042   TRIG 134 04/30/2018 1042   TRIG 138 11/13/2009 1337   HDL 49 04/30/2018 1042   CHOLHDL 3.3 04/30/2018 1042   CHOLHDL 3.9 11/22/2016 1521   VLDL 32 (H) 11/22/2016 1521   LDLCALC 84 04/30/2018 1042   Hepatic Function Panel     Component Value Date/Time   PROT 6.7 09/21/2018 0827   ALBUMIN 4.4 09/21/2018 0827   AST 21 09/21/2018 0827   ALT 21 09/21/2018 0827   ALKPHOS 130 (H) 09/21/2018 0827   BILITOT 0.3 09/21/2018 0827   BILIDIR 0.1 05/26/2015 0931      Component Value Date/Time   TSH 1.690 10/06/2017 0850   TSH 1.30 11/22/2016 1521   TSH 1.67 05/26/2015 0931   Results for CAMPCristi, Gwynn I (MRN [id number]) as of 12/02/2018 12:11  Ref. Range 09/21/2018 08:27  Vitamin D, 25-Hydroxy Latest Ref Range: 30.0 - 100.0 ng/mL 53.8   ASSESSMENT AND PLAN: Insulin resistance - Plan: metFORMIN (GLUCOPHAGE) 500 MG tablet  Vitamin D deficiency - Plan: Vitamin D, Ergocalciferol, (DRISDOL) 1.25 MG (50000 UT) CAPS capsule  At risk for  osteoporosis  Class 2 severe obesity with serious comorbidity and body mass index (BMI) of 35.0 to 35.9 in adult, unspecified obesity type ([place])  PLAN:  Insulin Resistance Aijah will continue to work on weight loss, diet, exercise, and decreasing simple carbohydrates in her diet to help decrease the risk of diabetes. We dicussed metformin including benefits and risks. She was informed that eating too many simple carbohydrates or too many calories at one sitting increases the likelihood of GI side effects. Brigitt agrees to continue taking metformin 500 mg q AM #30 and we will refill for 1 month. Rashae agrees to follow up with our clinic in 3 to 4 weeks as directed to monitor her progress.  Vitamin D Deficiency Makynleigh was informed that low vitamin D levels contributes to fatigue and are associated with obesity, breast, and colon cancer. Mckyla agrees to continue to take prescription Vit D @50 ,000 IU every 14 days #2 and we will refill for 1 month. She will follow up for routine testing of vitamin D, at least 2-3 times per year. She was informed of the risk of over-replacement of vitamin D and agrees to not increase her dose unless she discusses this with [name] first. Mahima agrees to follow up with our clinic in 3 to 4 weeks.  At risk for osteopenia and osteoporosis Darianne was given extended (15 minutes) osteoporosis prevention counseling today. Aracely is at risk for osteopenia and osteoporsis due to her vitamin D deficiency. She was encouraged to take her vitamin D and follow her higher calcium diet and increase strengthening exercise  to help strengthen her bones and decrease her risk of osteopenia and osteoporosis.  Obesity Afsa is currently in the action stage of change. As such, her goal is to continue with weight loss efforts She has agreed to follow the Category 2 plan Lakera has been instructed to work up to a goal of 150 minutes of combined cardio and strengthening exercise per week for  weight loss and overall health benefits. We discussed the following Behavioral Modification Strategies today: work on meal planning and easy cooking plans and holiday eating strategies    Verlean has agreed to follow up with our clinic in 3 to 4 weeks. She was informed of the importance of frequent follow up visits to maximize her success with intensive lifestyle modifications for her multiple health conditions.   OBESITY BEHAVIORAL INTERVENTION VISIT  Today's visit was # 23  Starting weight: 212 lbs Starting date: 10/06/17 Today's weight : 212 lbs Today's date: 12/02/2018 Total lbs lost to date: 0    ASK: We discussed the diagnosis of obesity with Hebert Soho today and Khloey agreed to give [name] permission to discuss obesity behavioral modification therapy today.  ASSESS: Martine has the diagnosis of obesity and her BMI today is 35.28 Lynsey is in the action stage of change   ADVISE: Lakshmi was educated on the multiple health risks of obesity as well as the benefit of weight loss to improve her health. She was advised of the need for long term treatment and the importance of lifestyle modifications.  AGREE: Multiple dietary modification options and treatment options were discussed and  Tynika agreed to the above obesity treatment plan.  Wilhemena Durie, am acting as transcriptionist for Abby Potash, PA-C I, Abby Potash, PA-C have reviewed above note and agree with its content

## 2018-12-03 ENCOUNTER — Encounter: Payer: Self-pay | Admitting: Family Medicine

## 2018-12-14 ENCOUNTER — Telehealth: Payer: 59 | Admitting: Family Medicine

## 2018-12-14 DIAGNOSIS — Z1231 Encounter for screening mammogram for malignant neoplasm of breast: Secondary | ICD-10-CM | POA: Diagnosis not present

## 2018-12-14 DIAGNOSIS — J019 Acute sinusitis, unspecified: Secondary | ICD-10-CM

## 2018-12-14 DIAGNOSIS — Z803 Family history of malignant neoplasm of breast: Secondary | ICD-10-CM | POA: Diagnosis not present

## 2018-12-14 DIAGNOSIS — B9789 Other viral agents as the cause of diseases classified elsewhere: Secondary | ICD-10-CM

## 2018-12-14 LAB — HM MAMMOGRAPHY

## 2018-12-14 MED ORDER — AZELASTINE HCL 0.1 % NA SOLN: 1.0000 | Freq: Two times a day (BID) | NASAL | 0 refills | Status: DC

## 2018-12-14 MED FILL — AZELASTINE HCL 137 MCG/SPRA: 137 | 50 days supply | Qty: 30 | Fill #0

## 2018-12-14 NOTE — Progress Notes (Signed)
We are sorry that you are not feeling well.  Here is how we plan to help!  Based on what you have shared with me it looks like you have sinusitis.  Sinusitis is inflammation and infection in the sinus cavities of the head.  Based on your presentation I believe you most likely have Acute Viral Sinusitis.This is an infection most likely caused by a virus. There is not specific treatment for viral sinusitis other than to help you with the symptoms until the infection runs its course.  You may use an oral decongestant such as Mucinex D or if you have glaucoma or high blood pressure use plain Mucinex. Saline nasal spray help and can safely be used as often as needed for congestion, I have prescribed: Azelastine nasal spray 2 sprays in each nostril twice a day   I believe that you should stay the course and continue to use your supportive care nasal sprays and decongestants.  if symptoms persist please seek  re-evaluation.  Some authorities believe that zinc sprays or the use of Echinacea may shorten the course of your symptoms.  Sinus infections are not as easily transmitted as other respiratory infection, however we still recommend that you avoid close contact with loved ones, especially the very young and elderly.  Remember to wash your hands thoroughly throughout the day as this is the number one way to prevent the spread of infection!  Home Care:  Only take medications as instructed by your medical team.  Do not take these medications with alcohol.  A steam or ultrasonic humidifier can help congestion.  You can place a towel over your head and breathe in the steam from hot water coming from a faucet.  Avoid close contacts especially the very young and the elderly.  Cover your mouth when you cough or sneeze.  Always remember to wash your hands.  Get Help Right Away If:  You develop worsening fever or sinus pain.  You develop a severe head ache or visual changes.  Your symptoms persist  after you have completed your treatment plan.  Make sure you  Understand these instructions.  Will watch your condition.  Will get help right away if you are not doing well or get worse.  Your e-visit answers were reviewed by a board certified advanced clinical practitioner to complete your personal care plan.  Depending on the condition, your plan could have included both over the counter or prescription medications.  If there is a problem please reply  once you have received a response from your provider.  Your safety is important to [name].  If you have drug allergies check your prescription carefully.    You can use MyChart to ask questions about today's visit, request a non-urgent call back, or ask for a work or school excuse for 24 hours related to this e-Visit. If it has been greater than 24 hours you will need to follow up with your provider, or enter a new e-Visit to address those concerns.  You will get an e-mail in the next two days asking about your experience.  I hope that your e-visit has been valuable and will speed your recovery. Thank you for using e-visits.

## 2018-12-21 MED FILL — metFORMIN HCL 500 MG TABS: 500 | 30 days supply | Qty: 30 | Fill #0

## 2018-12-29 DIAGNOSIS — H524 Presbyopia: Secondary | ICD-10-CM | POA: Diagnosis not present

## 2018-12-30 ENCOUNTER — Encounter (INDEPENDENT_AMBULATORY_CARE_PROVIDER_SITE_OTHER): Payer: Self-pay | Admitting: Physician Assistant

## 2018-12-30 ENCOUNTER — Ambulatory Visit (INDEPENDENT_AMBULATORY_CARE_PROVIDER_SITE_OTHER): Payer: 59 | Admitting: Physician Assistant

## 2018-12-30 VITALS — BP 131/74 | HR 80 | Temp 98.0°F | Ht 65.0 in | Wt 213.0 lb

## 2018-12-30 DIAGNOSIS — E559 Vitamin D deficiency, unspecified: Secondary | ICD-10-CM

## 2018-12-30 DIAGNOSIS — E7849 Other hyperlipidemia: Secondary | ICD-10-CM | POA: Diagnosis not present

## 2018-12-30 DIAGNOSIS — Z6835 Body mass index (BMI) 35.0-35.9, adult: Secondary | ICD-10-CM

## 2018-12-30 DIAGNOSIS — E8881 Metabolic syndrome: Secondary | ICD-10-CM | POA: Diagnosis not present

## 2018-12-30 DIAGNOSIS — Z9189 Other specified personal risk factors, not elsewhere classified: Secondary | ICD-10-CM | POA: Diagnosis not present

## 2018-12-30 MED ORDER — VITAMIN D (ERGOCALCIFEROL) 1.25 MG (50000 UNIT) PO CAPS: 50000.0000 [IU] | ORAL_CAPSULE | ORAL | 0 refills | Status: DC

## 2018-12-30 MED FILL — VIT D2 1.25 MG (50,000 UNIT: 1.25 MG | 28 days supply | Qty: 2 | Fill #0

## 2018-12-30 NOTE — Progress Notes (Signed)
Office: [phone]  /  Fax: [phone]   HPI:   Chief Complaint: OBESITY Hailey Noble is here to discuss her progress with her obesity treatment plan. She is on the Category 2 plan and is following her eating plan approximately 90 % of the time. She states she is doing BodyPump and swimming 60 minutes 4 times per week. Brinley reports that she is surprised that she did not lose weight, as she feels that she has been following the plan closely. Laverda has been eating egg whites instead of whole eggs in the morning. Her weight is 213 lb (96.6 kg) today and has had a weight gain of 1 pound over a period of 3 weeks since her last visit. She has lost 1 lb since starting treatment with [name].  Insulin Resistance Tamya has a diagnosis of insulin resistance based on her elevated fasting insulin level >5. Although Labrina's blood glucose readings are still under good control, insulin resistance puts her at greater risk of metabolic syndrome and diabetes. Keshana is taking metformin currently and she denies nausea, vomiting or diarrhea. She continues to work on diet and exercise to decrease risk of diabetes. Deannie denies polyphagia or hypoglycemia.  Vitamin D deficiency Tanayia has a diagnosis of vitamin D deficiency. She is currently taking vit D and denies nausea, vomiting or muscle weakness.  Hyperlipidemia Sabrinna has hyperlipidemia and she is not on medications. She has been trying to improve her cholesterol levels with intensive lifestyle modification including a low saturated fat diet, exercise and weight loss. She denies any chest pain.  At risk for cardiovascular disease Dola is at a higher than average risk for cardiovascular disease due to obesity and hyperlipidemia. She currently denies any chest pain.  ASSESSMENT AND PLAN:  Insulin resistance  Vitamin D deficiency - Plan: VITAMIN D 25 Hydroxy (Vit-D Deficiency, Fractures), Vitamin D, Ergocalciferol, (DRISDOL) 1.25 MG (50000 UT) CAPS  capsule  Other hyperlipidemia - Plan: Comprehensive metabolic panel, Hemoglobin A1c, Insulin, random, Lipid Panel With LDL/HDL Ratio  At risk for heart disease  Class 2 severe obesity with serious comorbidity and body mass index (BMI) of 35.0 to 35.9 in adult, unspecified obesity type (HCC)  PLAN:  Insulin Resistance Feleica will continue to work on weight loss, exercise, and decreasing simple carbohydrates in her diet to help decrease the risk of diabetes. We dicussed metformin including benefits and risks. She was informed that eating too many simple carbohydrates or too many calories at one sitting increases the likelihood of GI side effects. Mindel will continue metformin for now and prescription was not written today. We will check labs and Melani agreed to follow up with [name] as directed to monitor her progress.  Vitamin D Deficiency Liba was informed that low vitamin D levels contributes to fatigue and are associated with obesity, breast, and colon cancer. She agrees to continue to take prescription Vit D @50 ,000 IU every other week #2 with no refills and will follow up for routine testing of vitamin D, at least 2-3 times per year. She was informed of the risk of over-replacement of vitamin D and agrees to not increase her dose unless she discusses this with [name] first. Beckey agrees to follow up as directed.  Hyperlipidemia Debbe was informed of the American Heart Association Guidelines emphasizing intensive lifestyle modifications as the first line treatment for hyperlipidemia. We discussed many lifestyle modifications today in depth, and Nanako will continue to work on decreasing saturated fats such as fatty red meat, butter and many fried  foods. She will also increase vegetables and lean protein in her diet and continue to work on exercise and weight loss efforts. We will check labs and Lilee will follow up as directed.  Cardiovascular risk counseling Olita was given extended (15  minutes) coronary artery disease prevention counseling today. She is 53 y.o. female and has risk factors for heart disease including obesity and hyperlipidemia. We discussed intensive lifestyle modifications today with an emphasis on specific weight loss instructions and strategies. Pt was also informed of the importance of increasing exercise and decreasing saturated fats to help prevent heart disease.  Obesity Christyna is currently in the action stage of change. As such, her goal is to continue with weight loss efforts She has agreed to follow the Category 2 plan Adryanna has been instructed to work up to a goal of 150 minutes of combined cardio and strengthening exercise per week for weight loss and overall health benefits. We discussed the following Behavioral Modification Strategies today: planning for success and work on meal planning and easy cooking plans  Brielle has agreed to follow up with our clinic in 2 weeks. She was informed of the importance of frequent follow up visits to maximize her success with intensive lifestyle modifications for her multiple health conditions.  ALLERGIES: Allergies  Allergen Reactions  . Coconut Fatty Acids Hives  . Codeine   . Hydrocod Polst-Cpm Polst Er     REACTION: rash on face and head, itching  . Mango Flavor Hives  . [name] Other (See Comments)  . Oxycodone-Acetaminophen     REACTION: rash on face and head, itching  . Tussin Cough [Dextromethorphan Hbr]     Broken out in hives  . Ultram [Tramadol] Other (See Comments)    Dizzy and low blood pressure.     MEDICATIONS: Current Outpatient Medications on File Prior to Visit  Medication Sig Dispense Refill  . acetaminophen (TYLENOL) 650 MG CR tablet Take 650 mg by mouth every 8 (eight) hours as needed. pain    . azelastine (ASTELIN) 0.1 % nasal spray Place 1 spray into both nostrils 2 (two) times daily. Use in each nostril as directed 30 mL 0  . Calcium Carbonate-Vit D-Min (CALCIUM 1200 PO)  Take by mouth daily.      . cetirizine (ZYRTEC) 10 MG tablet Take 10 mg by mouth daily.    . Cholecalciferol (VITAMIN D-1000 MAX ST PO) Take 1 tablet by mouth.    . escitalopram (LEXAPRO) 10 MG tablet Take 1 tablet (10 mg total) by mouth daily. 90 tablet 3  . famotidine (PEPCID) 20 MG tablet Take 20 mg daily as needed by mouth.     Marland Kitchen KRILL OIL 1000 MG CAPS Take 1 capsule by mouth daily.    . metFORMIN (GLUCOPHAGE) 500 MG tablet Take 1 tablet (500 mg total) by mouth daily with breakfast. 30 tablet 0  . mometasone (NASONEX) 50 MCG/ACT nasal spray Place 2 sprays into the nose daily. 17 g 6  . Polyethyl Glycol-Propyl Glycol (SYSTANE FREE OP) Apply 1 drop to eye daily.     No current facility-administered medications on file prior to visit.     PAST MEDICAL HISTORY: Past Medical History:  Diagnosis Date  . Allergic state 12/08/2016  . Allergy   . Anxiety   . Back pain   . GERD (gastroesophageal reflux disease)   . Hyperlipidemia 06/09/2017  . Joint pain   . Knee pain, right 03/10/2017  . Plantar fasciitis   . Shortness of breath   .  Sinusitis   . Vitamin D deficiency 11/22/2016  . Vitamin D deficiency     PAST SURGICAL HISTORY: Past Surgical History:  Procedure Laterality Date  . COLONOSCOPY  02-2009   with Henrene Pastor normal  . ESOPHAGOGASTRODUODENOSCOPY  2012  . GANGLION CYST EXCISION Right    right- wrist   . NASAL SEPTUM SURGERY    . NASAL SINUS SURGERY    . TMJ ARTHROSCOPY     left    SOCIAL HISTORY: Social History   Tobacco Use  . Smoking status: Never Smoker  . Smokeless tobacco: Never Used  Substance Use Topics  . Alcohol use: Yes    Alcohol/week: 0.0 standard drinks    Comment: rare  . Drug use: No    FAMILY HISTORY: Family History  Problem Relation Age of Onset  . Hypertension Father   . Diabetes Father   . Hyperlipidemia Father   . Breast cancer Mother 19  . Cancer Mother        breast  . Thyroid disease Mother   . Sleep apnea Mother   . Colon cancer  Paternal Grandfather        died at age 60  . Cancer Paternal Grandfather        GI CA  . Obesity Sister   . Diabetes Sister   . Hypertension Sister   . Heart disease Maternal Grandmother        MI  . Cancer Maternal Grandfather        lung, smoker  . Diabetes Maternal Grandfather   . Diabetes Paternal Grandmother   . Heart disease Paternal Grandmother   . Pancreatitis Sister   . Esophageal cancer Neg Hx   . Stomach cancer Neg Hx   . Stroke Neg Hx   . Colon polyps Neg Hx   . Rectal cancer Neg Hx   . Pancreatic cancer Neg Hx     ROS: Review of Systems  Constitutional: Negative for weight loss.  Cardiovascular: Negative for chest pain.  Gastrointestinal: Negative for diarrhea, nausea and vomiting.  Musculoskeletal:       Negative for muscle weakness  Endo/Heme/Allergies:       Negative for polyphagia Negative for hypoglycemia    PHYSICAL EXAM: Blood pressure 131/74, pulse 80, temperature 98 F (36.7 C), temperature source Oral, height 5\' 5"  (1.651 m), weight 213 lb (96.6 kg), SpO2 93 %. Body mass index is 35.45 kg/m. Physical Exam Vitals signs reviewed.  Constitutional:      Appearance: Normal appearance. She is well-developed. She is obese.  Cardiovascular:     Rate and Rhythm: Normal rate.  Pulmonary:     Effort: Pulmonary effort is normal.  Musculoskeletal: Normal range of motion.  Skin:    General: Skin is warm and dry.  Neurological:     Mental Status: She is alert and oriented to person, place, and time.  Psychiatric:        Mood and Affect: Mood normal.        Behavior: Behavior normal.     RECENT LABS AND TESTS: BMET    Component Value Date/Time   NA 141 09/21/2018 0827   K 4.4 09/21/2018 0827   CL 102 09/21/2018 0827   CO2 23 09/21/2018 0827   GLUCOSE 88 09/21/2018 0827   GLUCOSE 85 11/22/2016 1521   GLUCOSE 86 11/13/2009   BUN 20 09/21/2018 0827   CREATININE 0.64 09/21/2018 0827   CREATININE 0.67 11/22/2016 1521   CALCIUM 9.0 09/21/2018  0827   GFRNONAA  102 09/21/2018 0827   GFRAA 118 09/21/2018 0827   Lab Results  Component Value Date   HGBA1C 5.4 09/21/2018   HGBA1C 5.4 04/30/2018   HGBA1C 5.2 10/06/2017   HGBA1C 5.1 04/06/2014   Lab Results  Component Value Date   INSULIN 11.1 09/21/2018   INSULIN 8.7 04/30/2018   INSULIN 8.8 10/06/2017   CBC    Component Value Date/Time   WBC 9.6 10/06/2017 0850   WBC 9.8 11/22/2016 1521   RBC 4.71 10/06/2017 0850   RBC 4.41 11/22/2016 1521   HGB 13.8 10/06/2017 0850   HCT 42.4 10/06/2017 0850   PLT 328 11/22/2016 1521   PLT 381 11/13/2009   MCV 90 10/06/2017 0850   MCH 29.3 10/06/2017 0850   MCH 29.7 11/22/2016 1521   MCHC 32.5 10/06/2017 0850   MCHC 32.2 11/22/2016 1521   RDW 13.5 10/06/2017 0850   LYMPHSABS 1.8 10/06/2017 0850   MONOABS 0.5 05/26/2015 0931   EOSABS 0.1 10/06/2017 0850   BASOSABS 0.1 10/06/2017 0850   Iron/TIBC/Ferritin/ %Sat    Component Value Date/Time   FERRITIN 50 11/09/2008   Lipid Panel     Component Value Date/Time   CHOL 160 04/30/2018 1042   TRIG 134 04/30/2018 1042   TRIG 138 11/13/2009 1337   HDL 49 04/30/2018 1042   CHOLHDL 3.3 04/30/2018 1042   CHOLHDL 3.9 11/22/2016 1521   VLDL 32 (H) 11/22/2016 1521   LDLCALC 84 04/30/2018 1042   Hepatic Function Panel     Component Value Date/Time   PROT 6.7 09/21/2018 0827   ALBUMIN 4.4 09/21/2018 0827   AST 21 09/21/2018 0827   ALT 21 09/21/2018 0827   ALKPHOS 130 (H) 09/21/2018 0827   BILITOT 0.3 09/21/2018 0827   BILIDIR 0.1 05/26/2015 0931      Component Value Date/Time   TSH 1.690 10/06/2017 0850   TSH 1.30 11/22/2016 1521   TSH 1.67 05/26/2015 0931   Results for CAMPGenee, Rann I (MRN [id number]) as of 12/30/2018 12:28  Ref. Range 09/21/2018 08:27  Vitamin D, 25-Hydroxy Latest Ref Range: 30.0 - 100.0 ng/mL 53.8     OBESITY BEHAVIORAL INTERVENTION VISIT  Today's visit was # 24  Starting weight: 212 lbs Starting date: 10/06/2017 Today's weight : 213 lbs Today's  date: 12/30/2018 Total lbs lost to date: 0   ASK: We discussed the diagnosis of obesity with Hebert Soho today and Jen agreed to give [name] permission to discuss obesity behavioral modification therapy today.  ASSESS: Myelle has the diagnosis of obesity and her BMI today is 35.44 Tisa is in the action stage of change   ADVISE: Roiza was educated on the multiple health risks of obesity as well as the benefit of weight loss to improve her health. She was advised of the need for long term treatment and the importance of lifestyle modifications to improve her current health and to decrease her risk of future health problems.  AGREE: Multiple dietary modification options and treatment options were discussed and  Vickye agreed to follow the recommendations documented in the above note.  ARRANGE: Wallis was educated on the importance of frequent visits to treat obesity as outlined per CMS and USPSTF guidelines and agreed to schedule her next follow up appointment today.  Corey Skains, am acting as transcriptionist for Abby Potash, PA-C I, Abby Potash, PA-C have reviewed above note and agree with its content

## 2018-12-31 LAB — COMPREHENSIVE METABOLIC PANEL
ALT: 20 IU/L (ref 0–32)
AST: 14 IU/L (ref 0–40)
Albumin/Globulin Ratio: 1.7 (ref 1.2–2.2)
Albumin: 4.2 g/dL (ref 3.5–5.5)
Alkaline Phosphatase: 144 IU/L — ABNORMAL HIGH (ref 39–117)
BUN/Creatinine Ratio: 31 — ABNORMAL HIGH (ref 9–23)
BUN: 18 mg/dL (ref 6–24)
Bilirubin Total: 0.2 mg/dL (ref 0.0–1.2)
CO2: 21 mmol/L (ref 20–29)
Calcium: 9 mg/dL (ref 8.7–10.2)
Chloride: 103 mmol/L (ref 96–106)
Creatinine, Ser: 0.58 mg/dL (ref 0.57–1.00)
GFR calc Af Amer: 122 mL/min/{1.73_m2} (ref >59)
GFR calc non Af Amer: 106 mL/min/{1.73_m2} (ref >59)
Globulin, Total: 2.5 g/dL (ref 1.5–4.5)
Glucose: 82 mg/dL (ref 65–99)
Potassium: 4.4 mmol/L (ref 3.5–5.2)
Sodium: 140 mmol/L (ref 134–144)
Total Protein: 6.7 g/dL (ref 6.0–8.5)

## 2018-12-31 LAB — INSULIN, RANDOM: INSULIN: 9.3 u[IU]/mL (ref 2.6–24.9)

## 2018-12-31 LAB — LIPID PANEL WITH LDL/HDL RATIO
Cholesterol, Total: 162 mg/dL (ref 100–199)
HDL: 51 mg/dL (ref >39)
LDL Calculated: 89 mg/dL (ref 0–99)
LDl/HDL Ratio: 1.7 ratio (ref 0.0–3.2)
Triglycerides: 109 mg/dL (ref 0–149)
VLDL Cholesterol Cal: 22 mg/dL (ref 5–40)

## 2018-12-31 LAB — HEMOGLOBIN A1C
ESTIMATED AVERAGE GLUCOSE: 108 mg/dL
Hgb A1c MFr Bld: 5.4 % (ref 4.8–5.6)

## 2018-12-31 LAB — VITAMIN D 25 HYDROXY (VIT D DEFICIENCY, FRACTURES): Vit D, 25-Hydroxy: 57.9 ng/mL (ref 30.0–100.0)

## 2019-01-04 ENCOUNTER — Other Ambulatory Visit: Payer: Self-pay | Admitting: Family Medicine

## 2019-01-05 MED FILL — ESCITALOPRAM 10 MG TABLET: 10 | 90 days supply | Qty: 90 | Fill #0

## 2019-01-13 ENCOUNTER — Ambulatory Visit (INDEPENDENT_AMBULATORY_CARE_PROVIDER_SITE_OTHER): Payer: 59 | Admitting: Physician Assistant

## 2019-01-13 ENCOUNTER — Encounter (INDEPENDENT_AMBULATORY_CARE_PROVIDER_SITE_OTHER): Payer: Self-pay | Admitting: Physician Assistant

## 2019-01-13 VITALS — BP 137/79 | HR 76 | Temp 98.1°F | Ht 65.0 in | Wt 211.0 lb

## 2019-01-13 DIAGNOSIS — Z6833 Body mass index (BMI) 33.0-33.9, adult: Secondary | ICD-10-CM | POA: Diagnosis not present

## 2019-01-13 DIAGNOSIS — Z130 Encounter for screening for diseases of the blood and blood-forming organs and certain disorders involving the immune mechanism: Secondary | ICD-10-CM | POA: Diagnosis not present

## 2019-01-13 DIAGNOSIS — E8881 Metabolic syndrome: Secondary | ICD-10-CM

## 2019-01-13 DIAGNOSIS — Z01419 Encounter for gynecological examination (general) (routine) without abnormal findings: Secondary | ICD-10-CM | POA: Diagnosis not present

## 2019-01-13 DIAGNOSIS — N950 Postmenopausal bleeding: Secondary | ICD-10-CM | POA: Diagnosis not present

## 2019-01-13 DIAGNOSIS — Z9189 Other specified personal risk factors, not elsewhere classified: Secondary | ICD-10-CM

## 2019-01-13 DIAGNOSIS — Z1389 Encounter for screening for other disorder: Secondary | ICD-10-CM | POA: Diagnosis not present

## 2019-01-13 DIAGNOSIS — Z780 Asymptomatic menopausal state: Secondary | ICD-10-CM | POA: Diagnosis not present

## 2019-01-13 DIAGNOSIS — F3289 Other specified depressive episodes: Secondary | ICD-10-CM

## 2019-01-13 DIAGNOSIS — Z6835 Body mass index (BMI) 35.0-35.9, adult: Secondary | ICD-10-CM | POA: Diagnosis not present

## 2019-01-13 DIAGNOSIS — Z124 Encounter for screening for malignant neoplasm of cervix: Secondary | ICD-10-CM | POA: Diagnosis not present

## 2019-01-13 MED ORDER — METFORMIN HCL 500 MG PO TABS: 500.0000 mg | ORAL_TABLET | Freq: Every day | ORAL | 0 refills | Status: DC

## 2019-01-13 NOTE — Progress Notes (Signed)
Office: [phone]  /  Fax: [phone]   HPI:   Chief Complaint: OBESITY Hailey Noble is here to discuss her progress with her obesity treatment plan. She is on the Category 2 plan and is following her eating plan approximately 90 % of the time. She states she is doing High-intensity interval training (HIIT) and body pump 45 minutes 3 times per week. Hailey Noble did well with weight loss. She reports that she is getting bored with the plan and would like to start journaling for more freedom. Her weight is 211 lb (95.7 kg) today and has had a weight loss of 2 pounds over a period of 2 weeks since her last visit. She has lost 1 lbs since starting treatment with [name].  Insulin Resistance Hailey Noble has a diagnosis of insulin resistance based on her elevated fasting insulin level >5. Although Hailey Noble's blood glucose readings are still under good control, insulin resistance puts her at greater risk of metabolic syndrome and diabetes. She is taking metformin currently and continues to work on diet and exercise to decrease risk of diabetes.  Depression with emotional eating behaviors Hailey Noble is struggling with emotional eating and using food for comfort to the extent that it is negatively impacting her health. She often snacks when she is not hungry. Sun sometimes feels she is out of control and then feels guilty that she made poor food choices. She has been working on behavior modification techniques to help reduce her emotional eating and has been somewhat successful. Hailey Noble has not started Wellbutrin. She reports cravings in the afternoon and evening. She shows no sign of suicidal or homicidal ideations.  Depression screen Hailey Noble 2/9 11/30/2018 10/06/2017 04/06/2014  Decreased Interest 0 0 0  Down, Depressed, Hopeless 0 0 0  PHQ - 2 Score 0 0 0  Altered sleeping - 0 -  Tired, decreased energy - 1 -  Change in appetite - 2 -  Feeling bad or failure about yourself  - 0 -  Trouble concentrating - 0 -  Moving  slowly or fidgety/restless - 0 -  Suicidal thoughts - 0 -  PHQ-9 Score - 3 -    At risk for diabetes Hailey Noble is at higher than average risk for developing diabetes due to her obesity. She currently denies polyuria or polydipsia.     ASSESSMENT AND PLAN:  Insulin resistance - Plan: metFORMIN (GLUCOPHAGE) 500 MG tablet  Other depression - with emotional eating  At risk for diabetes mellitus  Class 2 severe obesity with serious comorbidity and body mass index (BMI) of 35.0 to 35.9 in adult, unspecified obesity type (Hailey Noble)  PLAN:  Insulin Resistance Hailey Noble will continue to work on weight loss, exercise, and decreasing simple carbohydrates in her diet to help decrease the risk of diabetes. We dicussed metformin including benefits and risks. She was informed that eating too many simple carbohydrates or too many calories at one sitting increases the likelihood of GI side effects. Hailey Noble agrees to continue metformin 500 mg qd #30 with no refills and prescription was written today. Hailey Noble agrees to follow up with our clinic in 2 weeks.  Depression with Emotional Eating Behaviors We discussed behavior modification techniques today to help Hailey Noble deal with her emotional eating and depression. She has not started Wellbutrin. Hailey Noble agrees to follow up with our clinic in 2 weeks.  Diabetes risk counseling Hailey Noble was given extended (15 minutes) diabetes prevention counseling today. She is 53 y.o. female and has risk factors for diabetes including obesity. We discussed intensive  lifestyle modifications today with an emphasis on weight loss as well as increasing exercise and decreasing simple carbohydrates in her diet.  Obesity Hailey Noble is currently in the action stage of change. As such, her goal is to continue with weight loss efforts She has agreed to follow the Category 2 food plan and keep a food journal with 1200 calories and 80 grams of protein  Hailey Noble has been instructed to work up to a goal  of 150 minutes of combined cardio and strengthening exercise per week for weight loss and overall health benefits. We discussed the following Behavioral Modification Strategies today: work on meal planning and easy cooking plans and planning for success  Hailey Noble has agreed to follow up with our clinic in 2 weeks. She was informed of the importance of frequent follow up visits to maximize her success with intensive lifestyle modifications for her multiple health conditions.  ALLERGIES: Allergies  Allergen Reactions  . Coconut Fatty Acids Hives  . Codeine   . Hydrocod Polst-Cpm Polst Er     REACTION: rash on face and head, itching  . Mango Flavor Hives  . [name] Other (See Comments)  . Oxycodone-Acetaminophen     REACTION: rash on face and head, itching  . Tussin Cough [Dextromethorphan Hbr]     Broken out in hives  . Ultram [Tramadol] Other (See Comments)    Dizzy and low blood pressure.     MEDICATIONS: Current Outpatient Medications on File Prior to Visit  Medication Sig Dispense Refill  . acetaminophen (TYLENOL) 650 MG CR tablet Take 650 mg by mouth every 8 (eight) hours as needed. pain    . azelastine (ASTELIN) 0.1 % nasal spray Place 1 spray into both nostrils 2 (two) times daily. Use in each nostril as directed 30 mL 0  . Calcium Carbonate-Vit D-Min (CALCIUM 1200 PO) Take by mouth daily.      . cetirizine (ZYRTEC) 10 MG tablet Take 10 mg by mouth daily.    . Cholecalciferol (VITAMIN D-1000 MAX ST PO) Take 1 tablet by mouth.    . escitalopram (LEXAPRO) 10 MG tablet TAKE 1 TABLET BY MOUTH ONCE DAILY 90 tablet 3  . famotidine (PEPCID) 20 MG tablet Take 20 mg daily as needed by mouth.     Marland Kitchen KRILL OIL 1000 MG CAPS Take 1 capsule by mouth daily.    . mometasone (NASONEX) 50 MCG/ACT nasal spray Place 2 sprays into the nose daily. 17 g 6  . Polyethyl Glycol-Propyl Glycol (SYSTANE FREE OP) Apply 1 drop to eye daily.    . Vitamin D, Ergocalciferol, (DRISDOL) 1.25 MG (50000 UT) CAPS  capsule Take 1 capsule (50,000 Units total) by mouth every 14 (fourteen) days. 2 capsule 0   No current facility-administered medications on file prior to visit.     PAST MEDICAL HISTORY: Past Medical History:  Diagnosis Date  . Allergic state 12/08/2016  . Allergy   . Anxiety   . Back pain   . GERD (gastroesophageal reflux disease)   . Hyperlipidemia 06/09/2017  . Joint pain   . Knee pain, right 03/10/2017  . Plantar fasciitis   . Shortness of breath   . Sinusitis   . Vitamin D deficiency 11/22/2016  . Vitamin D deficiency     PAST SURGICAL HISTORY: Past Surgical History:  Procedure Laterality Date  . COLONOSCOPY  02-2009   with Henrene Pastor normal  . ESOPHAGOGASTRODUODENOSCOPY  2012  . GANGLION CYST EXCISION Right    right- wrist   . NASAL SEPTUM  SURGERY    . NASAL SINUS SURGERY    . TMJ ARTHROSCOPY     left    SOCIAL HISTORY: Social History   Tobacco Use  . Smoking status: Never Smoker  . Smokeless tobacco: Never Used  Substance Use Topics  . Alcohol use: Yes    Alcohol/week: 0.0 standard drinks    Comment: rare  . Drug use: No    FAMILY HISTORY: Family History  Problem Relation Age of Onset  . Hypertension Father   . Diabetes Father   . Hyperlipidemia Father   . Breast cancer Mother 64  . Cancer Mother        breast  . Thyroid disease Mother   . Sleep apnea Mother   . Colon cancer Paternal Grandfather        died at age 25  . Cancer Paternal Grandfather        GI CA  . Obesity Sister   . Diabetes Sister   . Hypertension Sister   . Heart disease Maternal Grandmother        MI  . Cancer Maternal Grandfather        lung, smoker  . Diabetes Maternal Grandfather   . Diabetes Paternal Grandmother   . Heart disease Paternal Grandmother   . Pancreatitis Sister   . Esophageal cancer Neg Hx   . Stomach cancer Neg Hx   . Stroke Neg Hx   . Colon polyps Neg Hx   . Rectal cancer Neg Hx   . Pancreatic cancer Neg Hx     ROS: Review of Systems    Constitutional: Positive for weight loss.  Endo/Heme/Allergies: Negative for polydipsia.       Negative for polyuria  Psychiatric/Behavioral: Positive for depression. Negative for suicidal ideas.    PHYSICAL EXAM: Blood pressure 137/79, pulse 76, temperature 98.1 F (36.7 C), temperature source Oral, height 5\' 5"  (1.651 m), weight 211 lb (95.7 kg), SpO2 98 %. Body mass index is 35.11 kg/m. Physical Exam Vitals signs reviewed.  Constitutional:      Appearance: Normal appearance. She is obese.  Cardiovascular:     Rate and Rhythm: Normal rate.     Pulses: Normal pulses.  Pulmonary:     Effort: Pulmonary effort is normal.  Musculoskeletal: Normal range of motion.  Skin:    General: Skin is warm and dry.  Neurological:     Mental Status: She is alert and oriented to person, place, and time.  Psychiatric:        Mood and Affect: Mood normal.        Behavior: Behavior normal.     RECENT LABS AND TESTS: BMET    Component Value Date/Time   NA 140 12/30/2018 1004   K 4.4 12/30/2018 1004   CL 103 12/30/2018 1004   CO2 21 12/30/2018 1004   GLUCOSE 82 12/30/2018 1004   GLUCOSE 85 11/22/2016 1521   GLUCOSE 86 11/13/2009   BUN 18 12/30/2018 1004   CREATININE 0.58 12/30/2018 1004   CREATININE 0.67 11/22/2016 1521   CALCIUM 9.0 12/30/2018 1004   GFRNONAA 106 12/30/2018 1004   GFRAA 122 12/30/2018 1004   Lab Results  Component Value Date   HGBA1C 5.4 12/30/2018   HGBA1C 5.4 09/21/2018   HGBA1C 5.4 04/30/2018   HGBA1C 5.2 10/06/2017   HGBA1C 5.1 04/06/2014   Lab Results  Component Value Date   INSULIN 9.3 12/30/2018   INSULIN 11.1 09/21/2018   INSULIN 8.7 04/30/2018   INSULIN 8.8 10/06/2017  CBC    Component Value Date/Time   WBC 9.6 10/06/2017 0850   WBC 9.8 11/22/2016 1521   RBC 4.71 10/06/2017 0850   RBC 4.41 11/22/2016 1521   HGB 13.8 10/06/2017 0850   HCT 42.4 10/06/2017 0850   PLT 328 11/22/2016 1521   PLT 381 11/13/2009   MCV 90 10/06/2017 0850    MCH 29.3 10/06/2017 0850   MCH 29.7 11/22/2016 1521   MCHC 32.5 10/06/2017 0850   MCHC 32.2 11/22/2016 1521   RDW 13.5 10/06/2017 0850   LYMPHSABS 1.8 10/06/2017 0850   MONOABS 0.5 05/26/2015 0931   EOSABS 0.1 10/06/2017 0850   BASOSABS 0.1 10/06/2017 0850   Iron/TIBC/Ferritin/ %Sat    Component Value Date/Time   FERRITIN 50 11/09/2008   Lipid Panel     Component Value Date/Time   CHOL 162 12/30/2018 1004   TRIG 109 12/30/2018 1004   TRIG 138 11/13/2009 1337   HDL 51 12/30/2018 1004   CHOLHDL 3.3 04/30/2018 1042   CHOLHDL 3.9 11/22/2016 1521   VLDL 32 (H) 11/22/2016 1521   LDLCALC 89 12/30/2018 1004   Hepatic Function Panel     Component Value Date/Time   PROT 6.7 12/30/2018 1004   ALBUMIN 4.2 12/30/2018 1004   AST 14 12/30/2018 1004   ALT 20 12/30/2018 1004   ALKPHOS 144 (H) 12/30/2018 1004   BILITOT 0.2 12/30/2018 1004   BILIDIR 0.1 05/26/2015 0931      Component Value Date/Time   TSH 1.690 10/06/2017 0850   TSH 1.30 11/22/2016 1521   TSH 1.67 05/26/2015 0931      OBESITY BEHAVIORAL INTERVENTION VISIT  Today's visit was # 25   Starting weight: 212 lbs Starting date: 10/06/2017 Today's weight :: 211 lbs Today's date: 01/13/2019 Total lbs lost to date: 1   ASK: We discussed the diagnosis of obesity with Hailey Noble today and Hailey Noble agreed to give [name] permission to discuss obesity behavioral modification therapy today.  ASSESS: Hailey Noble has the diagnosis of obesity and her BMI today is 35.11 Hailey Noble is in the action stage of change   ADVISE: Hailey Noble was educated on the multiple health risks of obesity as well as the benefit of weight loss to improve her health. She was advised of the need for long term treatment and the importance of lifestyle modifications to improve her current health and to decrease her risk of future health problems.  AGREE: Multiple dietary modification options and treatment options were discussed and  Hailey Noble agreed to follow the  recommendations documented in the above note.  ARRANGE: Hailey Noble was educated on the importance of frequent visits to treat obesity as outlined per CMS and USPSTF guidelines and agreed to schedule her next follow up appointment today.  I, Tammy Wysor, am acting as Location manager for [employer] I, Abby Potash, PA-C have reviewed above note and agree with its content

## 2019-01-25 MED FILL — metFORMIN HCL 500 MG TABS: 500 | 30 days supply | Qty: 30 | Fill #0

## 2019-01-27 ENCOUNTER — Encounter (INDEPENDENT_AMBULATORY_CARE_PROVIDER_SITE_OTHER): Payer: Self-pay | Admitting: Physician Assistant

## 2019-01-27 ENCOUNTER — Ambulatory Visit (INDEPENDENT_AMBULATORY_CARE_PROVIDER_SITE_OTHER): Payer: 59 | Admitting: Physician Assistant

## 2019-01-27 VITALS — BP 141/80 | HR 74 | Temp 97.7°F | Ht 65.0 in | Wt 212.0 lb

## 2019-01-27 DIAGNOSIS — F3289 Other specified depressive episodes: Secondary | ICD-10-CM | POA: Diagnosis not present

## 2019-01-27 DIAGNOSIS — Z9189 Other specified personal risk factors, not elsewhere classified: Secondary | ICD-10-CM | POA: Diagnosis not present

## 2019-01-27 DIAGNOSIS — Z6835 Body mass index (BMI) 35.0-35.9, adult: Secondary | ICD-10-CM | POA: Diagnosis not present

## 2019-01-27 DIAGNOSIS — E559 Vitamin D deficiency, unspecified: Secondary | ICD-10-CM

## 2019-01-27 MED ORDER — BUPROPION HCL ER (SR) 150 MG PO TB12: 150.0000 mg | ORAL_TABLET | Freq: Every day | ORAL | 0 refills | Status: DC

## 2019-01-27 MED ORDER — VITAMIN D (ERGOCALCIFEROL) 1.25 MG (50000 UNIT) PO CAPS: 50000.0000 [IU] | ORAL_CAPSULE | ORAL | 0 refills | Status: DC

## 2019-01-27 MED FILL — BUPROPION SR 150 MG TABLET: 150 | 30 days supply | Qty: 30 | Fill #0

## 2019-01-27 MED FILL — VIT D2 1.25 MG (50,000 UNIT: 1.25 MG | 28 days supply | Qty: 2 | Fill #0

## 2019-01-28 NOTE — Progress Notes (Signed)
Office: [phone]  /  Fax: [phone]   HPI:   Chief Complaint: OBESITY Hailey Noble is here to discuss her progress with her obesity treatment plan. She keeps a food journal with 1200 calories and 80 protein  and is following her eating plan approximately 85-90% of the time. She states she is doing high-intensity interval training (HIIT) and body pump 45 minutes 3 times per week. Hailey Noble reports that she is getting cravings during the afternoon and evening. She is ready to get back on track. Her weight is 212 lb (96.2 kg) today and has had a weight gain of 1 pound since her last visit. She has lost 0 lbs since starting treatment with [name].  Vitamin D deficiency Hailey Noble has a diagnosis of Vitamin D deficiency. She is currently taking prescription Vit D and denies nausea, vomiting or muscle weakness.  Depression with emotional eating behaviors Hailey Noble is struggling with emotional eating and using food for comfort to the extent that it is negatively impacting her health. She often snacks when she is not hungry. Hailey Noble sometimes feels she is out of control and then feels guilty that she made poor food choices. She has been working on behavior modification techniques to help reduce her emotional eating. She reports cravings especially in the afternoon and evening. She shows no sign of suicidal or homicidal ideations.  Depression screen [facility] 2/9 11/30/2018 10/06/2017 04/06/2014  Decreased Interest 0 0 0  Down, Depressed, Hopeless 0 0 0  PHQ - 2 Score 0 0 0  Altered sleeping - 0 -  Tired, decreased energy - 1 -  Change in appetite - 2 -  Feeling bad or failure about yourself  - 0 -  Trouble concentrating - 0 -  Moving slowly or fidgety/restless - 0 -  Suicidal thoughts - 0 -  PHQ-9 Score - 3 -   At risk for osteopenia and osteoporosis Hailey Noble is at higher risk of osteopenia and osteoporosis due to vitamin D deficiency.   ASSESSMENT AND PLAN:  Vitamin D deficiency - Plan: Vitamin D,  Ergocalciferol, (DRISDOL) 1.25 MG (50000 UT) CAPS capsule  Other depression - with emotional eating - Plan: buPROPion (WELLBUTRIN SR) 150 MG 12 hr tablet  At risk for osteoporosis  Class 2 severe obesity with serious comorbidity and body mass index (BMI) of 35.0 to 35.9 in adult, unspecified obesity type ([place])  PLAN:  Vitamin D Deficiency Hailey Noble was informed that low Vitamin D levels contributes to fatigue and are associated with obesity, breast, and colon cancer. She agrees to continue to take prescription Vit D @ 50,000 IU every 14 days #2 with no refills and will follow up for routine testing of Vitamin D, at least 2-3 times per year. She was informed of the risk of over-replacement of vitamin D and agrees to not increase her dose unless she discusses this with [name] first. Hailey Noble agrees to follow-up with our clinic in 2 weeks.  Depression with Emotional Eating Behaviors We discussed behavior modification techniques today to help Hailey Noble deal with her emotional eating and depression. She has agreed to take Wellbutrin SR 150 mg qam #30 with no refills and agrees to follow-up with our clinic in 2 weeks.   At risk for osteopenia and osteoporosis Hailey Noble was given extended  (15 minutes) osteoporosis prevention counseling today. Hailey Noble is at risk for osteopenia and osteoporsis due to her Vitamin D deficiency. She was encouraged to take her Vitamin D and follow her higher calcium diet and increase strengthening exercise to  help strengthen her bones and decrease her risk of osteopenia and osteoporosis.  Obesity Hailey Noble is currently in the action stage of change. As such, her goal is to continue with weight loss efforts. She has agreed to keep a food journal with 1200 calories and 80 protein. Hailey Noble has been instructed to work up to a goal of 150 minutes of combined cardio and strengthening exercise per week for weight loss and overall health benefits. We discussed the following Behavioral Modification  Strategies today: work on meal planning and easy cooking plans and planning for success.  Hailey Noble has agreed to follow up with our clinic in 2 weeks. She was informed of the importance of frequent follow up visits to maximize her success with intensive lifestyle modifications for her multiple health conditions.  ALLERGIES: Allergies  Allergen Reactions  . Coconut Fatty Acids Hives  . Codeine   . Hydrocod Polst-Cpm Polst Er     REACTION: rash on face and head, itching  . Mango Flavor Hives  . [name] Other (See Comments)  . Oxycodone-Acetaminophen     REACTION: rash on face and head, itching  . Tussin Cough [Dextromethorphan Hbr]     Broken out in hives  . Ultram [Tramadol] Other (See Comments)    Dizzy and low blood pressure.     MEDICATIONS: Current Outpatient Medications on File Prior to Visit  Medication Sig Dispense Refill  . acetaminophen (TYLENOL) 650 MG CR tablet Take 650 mg by mouth every 8 (eight) hours as needed. pain    . azelastine (ASTELIN) 0.1 % nasal spray Place 1 spray into both nostrils 2 (two) times daily. Use in each nostril as directed 30 mL 0  . Calcium Carbonate-Vit D-Min (CALCIUM 1200 PO) Take by mouth daily.      . cetirizine (ZYRTEC) 10 MG tablet Take 10 mg by mouth daily.    . Cholecalciferol (VITAMIN D-1000 MAX ST PO) Take 1 tablet by mouth.    . escitalopram (LEXAPRO) 10 MG tablet TAKE 1 TABLET BY MOUTH ONCE DAILY 90 tablet 3  . famotidine (PEPCID) 20 MG tablet Take 20 mg daily as needed by mouth.     Marland Kitchen KRILL OIL 1000 MG CAPS Take 1 capsule by mouth daily.    . metFORMIN (GLUCOPHAGE) 500 MG tablet Take 1 tablet (500 mg total) by mouth daily with breakfast. 30 tablet 0  . mometasone (NASONEX) 50 MCG/ACT nasal spray Place 2 sprays into the nose daily. 17 g 6  . Polyethyl Glycol-Propyl Glycol (SYSTANE FREE OP) Apply 1 drop to eye daily.     No current facility-administered medications on file prior to visit.     PAST MEDICAL HISTORY: Past Medical  History:  Diagnosis Date  . Allergic state 12/08/2016  . Allergy   . Anxiety   . Back pain   . GERD (gastroesophageal reflux disease)   . Hyperlipidemia 06/09/2017  . Joint pain   . Knee pain, right 03/10/2017  . Plantar fasciitis   . Shortness of breath   . Sinusitis   . Vitamin D deficiency 11/22/2016  . Vitamin D deficiency     PAST SURGICAL HISTORY: Past Surgical History:  Procedure Laterality Date  . COLONOSCOPY  02-2009   with Henrene Pastor normal  . ESOPHAGOGASTRODUODENOSCOPY  2012  . GANGLION CYST EXCISION Right    right- wrist   . NASAL SEPTUM SURGERY    . NASAL SINUS SURGERY    . TMJ ARTHROSCOPY     left    SOCIAL HISTORY: Social  History   Tobacco Use  . Smoking status: Never Smoker  . Smokeless tobacco: Never Used  Substance Use Topics  . Alcohol use: Yes    Alcohol/week: 0.0 standard drinks    Comment: rare  . Drug use: No    FAMILY HISTORY: Family History  Problem Relation Age of Onset  . Hypertension Father   . Diabetes Father   . Hyperlipidemia Father   . Breast cancer Mother 8  . Cancer Mother        breast  . Thyroid disease Mother   . Sleep apnea Mother   . Colon cancer Paternal Grandfather        died at age 39  . Cancer Paternal Grandfather        GI CA  . Obesity Sister   . Diabetes Sister   . Hypertension Sister   . Heart disease Maternal Grandmother        MI  . Cancer Maternal Grandfather        lung, smoker  . Diabetes Maternal Grandfather   . Diabetes Paternal Grandmother   . Heart disease Paternal Grandmother   . Pancreatitis Sister   . Esophageal cancer Neg Hx   . Stomach cancer Neg Hx   . Stroke Neg Hx   . Colon polyps Neg Hx   . Rectal cancer Neg Hx   . Pancreatic cancer Neg Hx    ROS: Review of Systems  Constitutional: Negative for weight loss.  GI: Negative for nausea and vomiting. Musculoskeletal: Negative for muscle weakness. Psychiatric/Behavioral: Positive for depression. Negative for homicidal or suicidal  ideas.  PHYSICAL EXAM: Blood pressure (!) 141/80, pulse 74, temperature 97.7 F (36.5 C), temperature source Oral, height 5\' 5"  (1.651 m), weight 212 lb (96.2 kg), SpO2 96 %. Body mass index is 35.28 kg/m. Physical Exam Vitals signs reviewed.  Constitutional:      Appearance: Normal appearance. She is obese.  Cardiovascular:     Rate and Rhythm: Normal rate.     Pulses: Normal pulses.  Pulmonary:     Effort: Pulmonary effort is normal.     Breath sounds: Normal breath sounds.  Musculoskeletal: Normal range of motion.  Skin:    General: Skin is warm and dry.  Neurological:     Mental Status: She is alert and oriented to person, place, and time.  Psychiatric:        Mood and Affect: Mood normal.        Behavior: Behavior normal.   RECENT LABS AND TESTS: BMET    Component Value Date/Time   NA 140 12/30/2018 1004   K 4.4 12/30/2018 1004   CL 103 12/30/2018 1004   CO2 21 12/30/2018 1004   GLUCOSE 82 12/30/2018 1004   GLUCOSE 85 11/22/2016 1521   GLUCOSE 86 11/13/2009   BUN 18 12/30/2018 1004   CREATININE 0.58 12/30/2018 1004   CREATININE 0.67 11/22/2016 1521   CALCIUM 9.0 12/30/2018 1004   GFRNONAA 106 12/30/2018 1004   GFRAA 122 12/30/2018 1004   Lab Results  Component Value Date   HGBA1C 5.4 12/30/2018   HGBA1C 5.4 09/21/2018   HGBA1C 5.4 04/30/2018   HGBA1C 5.2 10/06/2017   HGBA1C 5.1 04/06/2014   Lab Results  Component Value Date   INSULIN 9.3 12/30/2018   INSULIN 11.1 09/21/2018   INSULIN 8.7 04/30/2018   INSULIN 8.8 10/06/2017   CBC    Component Value Date/Time   WBC 9.6 10/06/2017 0850   WBC 9.8 11/22/2016 1521  RBC 4.71 10/06/2017 0850   RBC 4.41 11/22/2016 1521   HGB 13.8 10/06/2017 0850   HCT 42.4 10/06/2017 0850   PLT 328 11/22/2016 1521   PLT 381 11/13/2009   MCV 90 10/06/2017 0850   MCH 29.3 10/06/2017 0850   MCH 29.7 11/22/2016 1521   MCHC 32.5 10/06/2017 0850   MCHC 32.2 11/22/2016 1521   RDW 13.5 10/06/2017 0850   LYMPHSABS 1.8  10/06/2017 0850   MONOABS 0.5 05/26/2015 0931   EOSABS 0.1 10/06/2017 0850   BASOSABS 0.1 10/06/2017 0850   Iron/TIBC/Ferritin/ %Sat    Component Value Date/Time   FERRITIN 50 11/09/2008   Lipid Panel     Component Value Date/Time   CHOL 162 12/30/2018 1004   TRIG 109 12/30/2018 1004   TRIG 138 11/13/2009 1337   HDL 51 12/30/2018 1004   CHOLHDL 3.3 04/30/2018 1042   CHOLHDL 3.9 11/22/2016 1521   VLDL 32 (H) 11/22/2016 1521   LDLCALC 89 12/30/2018 1004   Hepatic Function Panel     Component Value Date/Time   PROT 6.7 12/30/2018 1004   ALBUMIN 4.2 12/30/2018 1004   AST 14 12/30/2018 1004   ALT 20 12/30/2018 1004   ALKPHOS 144 (H) 12/30/2018 1004   BILITOT 0.2 12/30/2018 1004   BILIDIR 0.1 05/26/2015 0931      Component Value Date/Time   TSH 1.690 10/06/2017 0850   TSH 1.30 11/22/2016 1521   TSH 1.67 05/26/2015 0931   Results for CAMPDemika, Langenderfer I (MRN [id number]) as of 01/28/2019 07:27  Ref. Range 12/30/2018 10:04  Vitamin D, 25-Hydroxy Latest Ref Range: 30.0 - 100.0 ng/mL 57.9   OBESITY BEHAVIORAL INTERVENTION VISIT  Today's visit was #26  Starting weight: 212 lbs Starting date: 10/06/2017 Today's weight: 212 lbs Today's date: 01/28/2019 Total lbs lost to date: 0  ASK: We discussed the diagnosis of obesity with Hebert Soho today and Leeza agreed to give [name] permission to discuss obesity behavioral modification therapy today.  ASSESS: Conchita has the diagnosis of obesity and her BMI today is @ 35.28. Deazia is in the action stage of change.  ADVISE: Hailey Noble was educated on the multiple health risks of obesity as well as the benefit of weight loss to improve her health. She was advised of the need for long term treatment and the importance of lifestyle modifications to improve her current health and to decrease her risk of future health problems.  AGREE: Multiple dietary modification options and treatment options were discussed and  Hailey Noble agreed to follow the  recommendations documented in the above note.  ARRANGE: Hailey Noble was educated on the importance of frequent visits to treat obesity as outlined per CMS and USPSTF guidelines and agreed to schedule her next follow up appointment today.  Migdalia Dk, am acting as transcriptionist for Abby Potash, PA-C I, Abby Potash, PA-C have reviewed above note and agree with its content

## 2019-02-17 ENCOUNTER — Ambulatory Visit (INDEPENDENT_AMBULATORY_CARE_PROVIDER_SITE_OTHER): Payer: 59 | Admitting: Physician Assistant

## 2019-02-17 ENCOUNTER — Encounter (INDEPENDENT_AMBULATORY_CARE_PROVIDER_SITE_OTHER): Payer: Self-pay | Admitting: Physician Assistant

## 2019-02-17 VITALS — BP 119/73 | HR 78 | Ht 65.0 in | Wt 211.0 lb

## 2019-02-17 DIAGNOSIS — Z6835 Body mass index (BMI) 35.0-35.9, adult: Secondary | ICD-10-CM

## 2019-02-17 DIAGNOSIS — Z9189 Other specified personal risk factors, not elsewhere classified: Secondary | ICD-10-CM

## 2019-02-17 DIAGNOSIS — E559 Vitamin D deficiency, unspecified: Secondary | ICD-10-CM | POA: Diagnosis not present

## 2019-02-17 DIAGNOSIS — E8881 Metabolic syndrome: Secondary | ICD-10-CM

## 2019-02-17 MED ORDER — VITAMIN D (ERGOCALCIFEROL) 1.25 MG (50000 UNIT) PO CAPS: 50000.0000 [IU] | ORAL_CAPSULE | ORAL | 0 refills | Status: DC

## 2019-02-17 MED ORDER — METFORMIN HCL 500 MG PO TABS: 500.0000 mg | ORAL_TABLET | Freq: Every day | ORAL | 0 refills | Status: DC

## 2019-02-17 NOTE — Progress Notes (Signed)
Office: [phone]  /  Fax: [phone]   HPI:   Chief Complaint: OBESITY Hailey Noble is here to discuss her progress with her obesity treatment plan. She is on the Category 2 plan and keeping a food journal of 1200 calories and 80 grams of protein and is following her eating plan approximately 90 % of the time. She states she is using the elliptical and treadmill 45 to 60 minutes 2 times per week. Hailey Noble did well with weight loss. She reports that she has been working out more and is thinking about getting a Physiological scientist.  Her weight is 211 lb (95.7 kg) today and has had a weight loss of 1 pound over a period of 2 weeks since her last visit. She has lost 1 lb since starting treatment with [name].  Vitamin D deficiency Hailey Noble has a diagnosis of vitamin D deficiency. She is currently taking vit D and denies nausea, vomiting, or muscle weakness.  At risk for osteopenia and osteoporosis Hailey Noble is at higher risk of osteopenia and osteoporosis due to vitamin D deficiency.   Insulin Resistance Hailey Noble has a diagnosis of insulin resistance based on her elevated fasting insulin level >5. Although [name]'s blood glucose readings are still under good control, insulin resistance puts her at greater risk of metabolic syndrome and diabetes. She is taking metformin currently and continues to work on diet and exercise to decrease risk of diabetes. Hailey Noble denies polyphagia, nausea, vomiting, or diarrhea.  ASSESSMENT AND PLAN:  Vitamin D deficiency - Plan: Vitamin D, Ergocalciferol, (DRISDOL) 1.25 MG (50000 UT) CAPS capsule  Insulin resistance - Plan: metFORMIN (GLUCOPHAGE) 500 MG tablet  At risk for osteoporosis  Class 2 severe obesity with serious comorbidity and body mass index (BMI) of 35.0 to 35.9 in adult, unspecified obesity type ([place])  PLAN:  Vitamin D Deficiency Hailey Noble was informed that low vitamin D levels contributes to fatigue and are associated with obesity, breast, and colon cancer.  Hailey Noble agrees to continue to take prescription Vit D @50 ,000 IU every 2 weeks #2 with no refills and will follow up for routine testing of vitamin D, at least 2-3 times per year. She was informed of the risk of over-replacement of vitamin D and agrees to not increase her dose unless she discusses this with [name] first. Hailey Noble agrees to follow up in 3 weeks as directed.  At risk for osteopenia and osteoporosis Hailey Noble was given extended (15 minutes) osteoporosis prevention counseling today. Hailey Noble is at risk for osteopenia and osteoporosis due to her vitamin D deficiency. She was encouraged to take her vitamin D and follow her higher calcium diet and increase strengthening exercise to help strengthen her bones and decrease her risk of osteopenia and osteoporosis.  Insulin Resistance Hailey Noble will continue to work on weight loss, exercise, and decreasing simple carbohydrates in her diet to help decrease the risk of diabetes. She was informed that eating too many simple carbohydrates or too many calories at one sitting increases the likelihood of GI side effects. Hailey Noble agreed to continue metformin 500mg  qAM #30 with no refills and prescription was written today. Hailey Noble agreed to follow up with [name] as directed to monitor her progress.  Obesity Hailey Noble is currently in the action stage of change. As such, her goal is to continue with weight loss efforts. She has agreed to keep a food journal with 1400 calories and 95 grams of protein or follow the Category 2 plan + 200 calories. Hailey Noble has been instructed to work up to a  goal of 150 minutes of combined cardio and strengthening exercise per week for weight loss and overall health benefits. We discussed the following Behavioral Modification Strategies today: increasing lean protein intake and work on meal planning and easy cooking plans.  Hailey Noble has agreed to follow up with our clinic in 3 weeks. She was informed of the importance of frequent follow up visits to  maximize her success with intensive lifestyle modifications for her multiple health conditions.  ALLERGIES: Allergies  Allergen Reactions  . Coconut Fatty Acids Hives  . Codeine   . Hydrocod Polst-Cpm Polst Er     REACTION: rash on face and head, itching  . Mango Flavor Hives  . [name] Other (See Comments)  . Oxycodone-Acetaminophen     REACTION: rash on face and head, itching  . Tussin Cough [Dextromethorphan Hbr]     Broken out in hives  . Ultram [Tramadol] Other (See Comments)    Dizzy and low blood pressure.     MEDICATIONS: Current Outpatient Medications on File Prior to Visit  Medication Sig Dispense Refill  . acetaminophen (TYLENOL) 650 MG CR tablet Take 650 mg by mouth every 8 (eight) hours as needed. pain    . Calcium Carbonate-Vit D-Min (CALCIUM 1200 PO) Take by mouth daily.      . cetirizine (ZYRTEC) 10 MG tablet Take 10 mg by mouth daily.    . Cholecalciferol (VITAMIN D-1000 MAX ST PO) Take 1 tablet by mouth.    . escitalopram (LEXAPRO) 10 MG tablet TAKE 1 TABLET BY MOUTH ONCE DAILY 90 tablet 3  . KRILL OIL 1000 MG CAPS Take 1 capsule by mouth daily.    . metFORMIN (GLUCOPHAGE) 500 MG tablet Take 1 tablet (500 mg total) by mouth daily with breakfast. 30 tablet 0  . mometasone (NASONEX) 50 MCG/ACT nasal spray Place 2 sprays into the nose daily. 17 g 6  . Polyethyl Glycol-Propyl Glycol (SYSTANE FREE OP) Apply 1 drop to eye daily.    . Vitamin D, Ergocalciferol, (DRISDOL) 1.25 MG (50000 UT) CAPS capsule Take 1 capsule (50,000 Units total) by mouth every 14 (fourteen) days. 2 capsule 0   No current facility-administered medications on file prior to visit.     PAST MEDICAL HISTORY: Past Medical History:  Diagnosis Date  . Allergic state 12/08/2016  . Allergy   . Anxiety   . Back pain   . GERD (gastroesophageal reflux disease)   . Hyperlipidemia 06/09/2017  . Joint pain   . Knee pain, right 03/10/2017  . Plantar fasciitis   . Shortness of breath   . Sinusitis    . Vitamin D deficiency 11/22/2016  . Vitamin D deficiency     PAST SURGICAL HISTORY: Past Surgical History:  Procedure Laterality Date  . COLONOSCOPY  02-2009   with Henrene Pastor normal  . ESOPHAGOGASTRODUODENOSCOPY  2012  . GANGLION CYST EXCISION Right    right- wrist   . NASAL SEPTUM SURGERY    . NASAL SINUS SURGERY    . TMJ ARTHROSCOPY     left    SOCIAL HISTORY: Social History   Tobacco Use  . Smoking status: Never Smoker  . Smokeless tobacco: Never Used  Substance Use Topics  . Alcohol use: Yes    Alcohol/week: 0.0 standard drinks    Comment: rare  . Drug use: No    FAMILY HISTORY: Family History  Problem Relation Age of Onset  . Hypertension Father   . Diabetes Father   . Hyperlipidemia Father   . Breast  cancer Mother 67  . Cancer Mother        breast  . Thyroid disease Mother   . Sleep apnea Mother   . Colon cancer Paternal Grandfather        died at age 59  . Cancer Paternal Grandfather        GI CA  . Obesity Sister   . Diabetes Sister   . Hypertension Sister   . Heart disease Maternal Grandmother        MI  . Cancer Maternal Grandfather        lung, smoker  . Diabetes Maternal Grandfather   . Diabetes Paternal Grandmother   . Heart disease Paternal Grandmother   . Pancreatitis Sister   . Esophageal cancer Neg Hx   . Stomach cancer Neg Hx   . Stroke Neg Hx   . Colon polyps Neg Hx   . Rectal cancer Neg Hx   . Pancreatic cancer Neg Hx     ROS: Review of Systems  Gastrointestinal: Negative for diarrhea, nausea and vomiting.  Musculoskeletal:       Negative for muscle weakness.  Endo/Heme/Allergies:       Negative for polyphagia.    PHYSICAL EXAM: Blood pressure 119/73, pulse 78, height 5\' 5"  (1.651 m), weight 211 lb (95.7 kg), SpO2 97 %. Body mass index is 35.11 kg/m. Physical Exam Vitals signs reviewed.  Constitutional:      Appearance: Normal appearance. She is obese.  Cardiovascular:     Rate and Rhythm: Normal rate.  Pulmonary:      Effort: Pulmonary effort is normal.  Musculoskeletal: Normal range of motion.  Skin:    General: Skin is warm and dry.  Neurological:     Mental Status: She is alert and oriented to person, place, and time.  Psychiatric:        Mood and Affect: Mood normal.        Behavior: Behavior normal.     RECENT LABS AND TESTS: BMET    Component Value Date/Time   NA 140 12/30/2018 1004   K 4.4 12/30/2018 1004   CL 103 12/30/2018 1004   CO2 21 12/30/2018 1004   GLUCOSE 82 12/30/2018 1004   GLUCOSE 85 11/22/2016 1521   GLUCOSE 86 11/13/2009   BUN 18 12/30/2018 1004   CREATININE 0.58 12/30/2018 1004   CREATININE 0.67 11/22/2016 1521   CALCIUM 9.0 12/30/2018 1004   GFRNONAA 106 12/30/2018 1004   GFRAA 122 12/30/2018 1004   Lab Results  Component Value Date   HGBA1C 5.4 12/30/2018   HGBA1C 5.4 09/21/2018   HGBA1C 5.4 04/30/2018   HGBA1C 5.2 10/06/2017   HGBA1C 5.1 04/06/2014   Lab Results  Component Value Date   INSULIN 9.3 12/30/2018   INSULIN 11.1 09/21/2018   INSULIN 8.7 04/30/2018   INSULIN 8.8 10/06/2017   CBC    Component Value Date/Time   WBC 9.6 10/06/2017 0850   WBC 9.8 11/22/2016 1521   RBC 4.71 10/06/2017 0850   RBC 4.41 11/22/2016 1521   HGB 13.8 10/06/2017 0850   HCT 42.4 10/06/2017 0850   PLT 328 11/22/2016 1521   PLT 381 11/13/2009   MCV 90 10/06/2017 0850   MCH 29.3 10/06/2017 0850   MCH 29.7 11/22/2016 1521   MCHC 32.5 10/06/2017 0850   MCHC 32.2 11/22/2016 1521   RDW 13.5 10/06/2017 0850   LYMPHSABS 1.8 10/06/2017 0850   MONOABS 0.5 05/26/2015 0931   EOSABS 0.1 10/06/2017 0850   BASOSABS 0.1  10/06/2017 0850   Iron/TIBC/Ferritin/ %Sat    Component Value Date/Time   FERRITIN 50 11/09/2008   Lipid Panel     Component Value Date/Time   CHOL 162 12/30/2018 1004   TRIG 109 12/30/2018 1004   TRIG 138 11/13/2009 1337   HDL 51 12/30/2018 1004   CHOLHDL 3.3 04/30/2018 1042   CHOLHDL 3.9 11/22/2016 1521   VLDL 32 (H) 11/22/2016 1521   LDLCALC  89 12/30/2018 1004   Hepatic Function Panel     Component Value Date/Time   PROT 6.7 12/30/2018 1004   ALBUMIN 4.2 12/30/2018 1004   AST 14 12/30/2018 1004   ALT 20 12/30/2018 1004   ALKPHOS 144 (H) 12/30/2018 1004   BILITOT 0.2 12/30/2018 1004   BILIDIR 0.1 05/26/2015 0931      Component Value Date/Time   TSH 1.690 10/06/2017 0850   TSH 1.30 11/22/2016 1521   TSH 1.67 05/26/2015 0931   Results for CAMPMarcea, Rojek I (MRN [id number]) as of 02/17/2019 09:13  Ref. Range 12/30/2018 10:04  Vitamin D, 25-Hydroxy Latest Ref Range: 30.0 - 100.0 ng/mL 57.9   OBESITY BEHAVIORAL INTERVENTION VISIT  Today's visit was # 27   Starting weight: 212 lbs Starting date: 10/06/17 Today's weight : Weight: 211 lb (95.7 kg)  Today's date: 02/17/2019 Total lbs lost to date: 1    02/17/2019  Height 5\' 5"  (1.651 m)  Weight 211 lb (95.7 kg)  BMI (Calculated) 35.11  BLOOD PRESSURE - SYSTOLIC 500  BLOOD PRESSURE - DIASTOLIC 73   Body Fat % 37.0 %  Total Body Water (lbs) 81.2 lbs   ASK: We discussed the diagnosis of obesity with Hailey Noble today and Hailey Noble agreed to give [name] permission to discuss obesity behavioral modification therapy today.  ASSESS: Larkin has the diagnosis of obesity and her BMI today is 35.11 Hailey Noble is in the action stage of change.   ADVISE: Hailey Noble was educated on the multiple health risks of obesity as well as the benefit of weight loss to improve her health. She was advised of the need for long term treatment and the importance of lifestyle modifications to improve her current health and to decrease her risk of future health problems.  AGREE: Multiple dietary modification options and treatment options were discussed and Hailey Noble agreed to follow the recommendations documented in the above note.  ARRANGE: Hailey Noble was educated on the importance of frequent visits to treat obesity as outlined per CMS and USPSTF guidelines and agreed to schedule her next follow up appointment  today.  Lenward Chancellor, CMA, am acting as transcriptionist for Abby Potash, PA-C I, Abby Potash, PA-C have reviewed above note and agree with its content

## 2019-02-18 DIAGNOSIS — N950 Postmenopausal bleeding: Secondary | ICD-10-CM | POA: Diagnosis not present

## 2019-02-18 DIAGNOSIS — N858 Other specified noninflammatory disorders of uterus: Secondary | ICD-10-CM | POA: Diagnosis not present

## 2019-02-18 MED FILL — VIT D2 1.25 MG (50,000 UNIT: 1.25 MG | 28 days supply | Qty: 2 | Fill #0

## 2019-02-18 MED FILL — metFORMIN HCL 500 MG TABS: 500 | 30 days supply | Qty: 30 | Fill #0 | Status: TO

## 2019-03-05 MED FILL — MOMETASONE FUROATE 50 MCG S: 50 | 30 days supply | Qty: 17 | Fill #1

## 2019-03-08 ENCOUNTER — Encounter (INDEPENDENT_AMBULATORY_CARE_PROVIDER_SITE_OTHER): Payer: Self-pay

## 2019-03-09 ENCOUNTER — Encounter (INDEPENDENT_AMBULATORY_CARE_PROVIDER_SITE_OTHER): Payer: Self-pay

## 2019-03-10 ENCOUNTER — Ambulatory Visit (INDEPENDENT_AMBULATORY_CARE_PROVIDER_SITE_OTHER): Payer: 59 | Admitting: Physician Assistant

## 2019-03-10 ENCOUNTER — Other Ambulatory Visit: Payer: Self-pay

## 2019-03-10 ENCOUNTER — Encounter (INDEPENDENT_AMBULATORY_CARE_PROVIDER_SITE_OTHER): Payer: Self-pay | Admitting: Physician Assistant

## 2019-03-10 VITALS — BP 135/79 | HR 80 | Temp 97.8°F | Ht 65.0 in | Wt 212.0 lb

## 2019-03-10 DIAGNOSIS — Z6835 Body mass index (BMI) 35.0-35.9, adult: Secondary | ICD-10-CM | POA: Diagnosis not present

## 2019-03-10 DIAGNOSIS — E559 Vitamin D deficiency, unspecified: Secondary | ICD-10-CM

## 2019-03-10 DIAGNOSIS — E8881 Metabolic syndrome: Secondary | ICD-10-CM | POA: Diagnosis not present

## 2019-03-10 DIAGNOSIS — Z9189 Other specified personal risk factors, not elsewhere classified: Secondary | ICD-10-CM

## 2019-03-10 MED ORDER — VITAMIN D (ERGOCALCIFEROL) 1.25 MG (50000 UNIT) PO CAPS: 50000.0000 [IU] | ORAL_CAPSULE | ORAL | 0 refills | Status: DC

## 2019-03-10 MED FILL — VIT D2 1.25 MG (50,000 UNIT: 1.25 MG | 28 days supply | Qty: 2 | Fill #0

## 2019-03-10 NOTE — Progress Notes (Signed)
Office: [phone]  /  Fax: [phone]   HPI:   Chief Complaint: OBESITY Hailey Noble is here to discuss her progress with her obesity treatment plan. She is on the Category 2 plan + 200 calories and is following her eating plan approximately 90% of the time. She states she is riding bike 45-60 minutes 3 times per week. Hailey Noble reports that she has been exercising less due to her gym closing. She reports still having cravings intermittently.  Her weight is 212 lb (96.2 kg) today and has had a weight gain of 1 pound since her last visit. She has lost 0 lbs since starting treatment with [name].  Vitamin D deficiency Hailey Noble has a diagnosis of Vitamin D deficiency. She is currently taking prescription Vit D and denies nausea, vomiting or muscle weakness.  At risk for osteopenia and osteoporosis Hailey Noble is at higher risk of osteopenia and osteoporosis due to Vitamin D deficiency.   Insulin Resistance Hailey Noble has a diagnosis of insulin resistance based on her elevated fasting insulin level >5. Although Hailey Noble's blood glucose readings are still under good control, insulin resistance puts her at greater risk of metabolic syndrome and diabetes. She is taking metformin currently and continues to work on diet and exercise to decrease risk of diabetes. She denies nausea, vomiting, diarrhea, or polyphagia.  ASSESSMENT AND PLAN:  Vitamin D deficiency - Plan: Vitamin D, Ergocalciferol, (DRISDOL) 1.25 MG (50000 UT) CAPS capsule  Insulin resistance  At risk for osteoporosis  Class 2 severe obesity with serious comorbidity and body mass index (BMI) of 35.0 to 35.9 in adult, unspecified obesity type ([place])  PLAN:  Vitamin D Deficiency Hailey Noble was informed that low Vitamin D levels contributes to fatigue and are associated with obesity, breast, and colon cancer. She agrees to continue to take prescription Vit D @ 50,000 IU every 14 days #2 with 0 refills and will follow-up for routine testing of Vitamin D, at  least 2-3 times per year. She was informed of the risk of over-replacement of Vitamin D and agrees to not increase her dose unless she discusses this with [name] first. Hailey Noble agrees to follow-up with our clinic in 3 weeks.  At risk for osteopenia and osteoporosis Hailey Noble was given extended  (15 minutes) osteoporosis prevention counseling today. Hailey Noble is at risk for osteopenia and osteoporsis due to her Vitamin D deficiency. She was encouraged to take her Vitamin D and follow her higher calcium diet and increase strengthening exercise to help strengthen her bones and decrease her risk of osteopenia and osteoporosis.  Insulin Resistance Hailey Noble will continue to work on weight loss, exercise, and decreasing simple carbohydrates in her diet to help decrease the risk of diabetes. We dicussed metformin including benefits and risks. She was informed that eating too many simple carbohydrates or too many calories at one sitting increases the likelihood of GI side effects. Hailey Noble will continue metformin for now and she has agreed to follow-up with [name] as directed to monitor her progress.  Obesity Hailey Noble is currently in the action stage of change. As such, her goal is to continue with weight loss efforts. She has agreed to follow the Category 2 plan. Hailey Noble has been instructed to work up to a goal of 150 minutes of combined cardio and strengthening exercise per week for weight loss and overall health benefits. We discussed the following Behavioral Modification Strategies today: work on meal planning, easy cooking plans, and ways to avoid boredom eating.  Hailey Noble has agreed to follow-up with our clinic  in 3 weeks. She was informed of the importance of frequent follow-up visits to maximize her success with intensive lifestyle modifications for her multiple health conditions.  ALLERGIES: Allergies  Allergen Reactions   Coconut Fatty Acids Hives   Codeine    Hydrocod Polst-Cpm Polst Er     REACTION: rash on  face and head, itching   Mango Flavor Hives   [name] Other (See Comments)   Oxycodone-Acetaminophen     REACTION: rash on face and head, itching   Tussin Cough [Dextromethorphan Hbr]     Broken out in hives   Ultram [Tramadol] Other (See Comments)    Dizzy and low blood pressure.     MEDICATIONS: Current Outpatient Medications on File Prior to Visit  Medication Sig Dispense Refill   acetaminophen (TYLENOL) 650 MG CR tablet Take 650 mg by mouth every 8 (eight) hours as needed. pain     Calcium Carbonate-Vit D-Min (CALCIUM 1200 PO) Take by mouth daily.       cetirizine (ZYRTEC) 10 MG tablet Take 10 mg by mouth daily.     Cholecalciferol (VITAMIN D-1000 MAX ST PO) Take 1 tablet by mouth.     escitalopram (LEXAPRO) 10 MG tablet TAKE 1 TABLET BY MOUTH ONCE DAILY 90 tablet 3   KRILL OIL 1000 MG CAPS Take 1 capsule by mouth daily.     metFORMIN (GLUCOPHAGE) 500 MG tablet Take 1 tablet (500 mg total) by mouth daily with breakfast. 30 tablet 0   mometasone (NASONEX) 50 MCG/ACT nasal spray Place 2 sprays into the nose daily. 17 g 6   Polyethyl Glycol-Propyl Glycol (SYSTANE FREE OP) Apply 1 drop to eye daily.     No current facility-administered medications on file prior to visit.     PAST MEDICAL HISTORY: Past Medical History:  Diagnosis Date   Allergic state 12/08/2016   Allergy    Anxiety    Back pain    GERD (gastroesophageal reflux disease)    Hyperlipidemia 06/09/2017   Joint pain    Knee pain, right 03/10/2017   Plantar fasciitis    Shortness of breath    Sinusitis    Vitamin D deficiency 11/22/2016   Vitamin D deficiency     PAST SURGICAL HISTORY: Past Surgical History:  Procedure Laterality Date   COLONOSCOPY  02-2009   with Henrene Pastor normal   ESOPHAGOGASTRODUODENOSCOPY  2012   GANGLION CYST EXCISION Right    right- wrist    NASAL SEPTUM SURGERY     NASAL SINUS SURGERY     TMJ ARTHROSCOPY     left    SOCIAL HISTORY: Social History    Tobacco Use   Smoking status: Never Smoker   Smokeless tobacco: Never Used  Substance Use Topics   Alcohol use: Yes    Alcohol/week: 0.0 standard drinks    Comment: rare   Drug use: No    FAMILY HISTORY: Family History  Problem Relation Age of Onset   Hypertension Father    Diabetes Father    Hyperlipidemia Father    Breast cancer Mother 37   Cancer Mother        breast   Thyroid disease Mother    Sleep apnea Mother    Colon cancer Paternal Grandfather        died at age 56   Cancer Paternal Grandfather        GI CA   Obesity Sister    Diabetes Sister    Hypertension Sister    Heart disease Maternal  Grandmother        MI   Cancer Maternal Grandfather        lung, smoker   Diabetes Maternal Grandfather    Diabetes Paternal Grandmother    Heart disease Paternal Grandmother    Pancreatitis Sister    Esophageal cancer Neg Hx    Stomach cancer Neg Hx    Stroke Neg Hx    Colon polyps Neg Hx    Rectal cancer Neg Hx    Pancreatic cancer Neg Hx    ROS: Review of Systems  Constitutional: Negative for weight loss.  Gastrointestinal: Negative for diarrhea, nausea and vomiting.  Musculoskeletal:       Negative for muscle weakness.  Endo/Heme/Allergies:       Negative for polyphagia.   PHYSICAL EXAM: Blood pressure 135/79, pulse 80, temperature 97.8 F (36.6 C), height 5\' 5"  (1.651 m), weight 212 lb (96.2 kg), SpO2 92 %. Body mass index is 35.28 kg/m. Physical Exam Vitals signs reviewed.  Constitutional:      Appearance: Normal appearance. She is obese.  Cardiovascular:     Rate and Rhythm: Normal rate.     Pulses: Normal pulses.  Pulmonary:     Effort: Pulmonary effort is normal.     Breath sounds: Normal breath sounds.  Musculoskeletal: Normal range of motion.  Skin:    General: Skin is warm and dry.  Neurological:     Mental Status: She is alert and oriented to person, place, and time.  Psychiatric:        Behavior:  Behavior normal.   RECENT LABS AND TESTS: BMET    Component Value Date/Time   NA 140 12/30/2018 1004   K 4.4 12/30/2018 1004   CL 103 12/30/2018 1004   CO2 21 12/30/2018 1004   GLUCOSE 82 12/30/2018 1004   GLUCOSE 85 11/22/2016 1521   GLUCOSE 86 11/13/2009   BUN 18 12/30/2018 1004   CREATININE 0.58 12/30/2018 1004   CREATININE 0.67 11/22/2016 1521   CALCIUM 9.0 12/30/2018 1004   GFRNONAA 106 12/30/2018 1004   GFRAA 122 12/30/2018 1004   Lab Results  Component Value Date   HGBA1C 5.4 12/30/2018   HGBA1C 5.4 09/21/2018   HGBA1C 5.4 04/30/2018   HGBA1C 5.2 10/06/2017   HGBA1C 5.1 04/06/2014   Lab Results  Component Value Date   INSULIN 9.3 12/30/2018   INSULIN 11.1 09/21/2018   INSULIN 8.7 04/30/2018   INSULIN 8.8 10/06/2017   CBC    Component Value Date/Time   WBC 9.6 10/06/2017 0850   WBC 9.8 11/22/2016 1521   RBC 4.71 10/06/2017 0850   RBC 4.41 11/22/2016 1521   HGB 13.8 10/06/2017 0850   HCT 42.4 10/06/2017 0850   PLT 328 11/22/2016 1521   PLT 381 11/13/2009   MCV 90 10/06/2017 0850   MCH 29.3 10/06/2017 0850   MCH 29.7 11/22/2016 1521   MCHC 32.5 10/06/2017 0850   MCHC 32.2 11/22/2016 1521   RDW 13.5 10/06/2017 0850   LYMPHSABS 1.8 10/06/2017 0850   MONOABS 0.5 05/26/2015 0931   EOSABS 0.1 10/06/2017 0850   BASOSABS 0.1 10/06/2017 0850   Iron/TIBC/Ferritin/ %Sat    Component Value Date/Time   FERRITIN 50 11/09/2008   Lipid Panel     Component Value Date/Time   CHOL 162 12/30/2018 1004   TRIG 109 12/30/2018 1004   TRIG 138 11/13/2009 1337   HDL 51 12/30/2018 1004   CHOLHDL 3.3 04/30/2018 1042   CHOLHDL 3.9 11/22/2016 1521   VLDL  32 (H) 11/22/2016 1521   LDLCALC 89 12/30/2018 1004   Hepatic Function Panel     Component Value Date/Time   PROT 6.7 12/30/2018 1004   ALBUMIN 4.2 12/30/2018 1004   AST 14 12/30/2018 1004   ALT 20 12/30/2018 1004   ALKPHOS 144 (H) 12/30/2018 1004   BILITOT 0.2 12/30/2018 1004   BILIDIR 0.1 05/26/2015 0931       Component Value Date/Time   TSH 1.690 10/06/2017 0850   TSH 1.30 11/22/2016 1521   TSH 1.67 05/26/2015 0931   Results for CASSADY, STANCZAK I (MRN [id number]) as of 03/10/2019 12:09  Ref. Range 12/30/2018 10:04  Vitamin D, 25-Hydroxy Latest Ref Range: 30.0 - 100.0 ng/mL 57.9   OBESITY BEHAVIORAL INTERVENTION VISIT  Today's visit was #28  Starting weight: 212 lbs Starting date: 10/06/2017 Today's weight: 212 lbs  Today's date: 03/10/2019 Total lbs lost to date: 0    03/10/2019  Height 5\' 5"  (1.651 m)  Weight 212 lb (96.2 kg)  BMI (Calculated) 35.28  BLOOD PRESSURE - SYSTOLIC 287  BLOOD PRESSURE - DIASTOLIC 79   Body Fat % 68.1 %  Total Body Water (lbs) 83 lbs   ASK: We discussed the diagnosis of obesity with Hebert Soho today and Princella agreed to give [name] permission to discuss obesity behavioral modification therapy today.  ASSESS: Twylah has the diagnosis of obesity and her BMI today is 35.28. Layan is in the action stage of change.   ADVISE: Jasmaine was educated on the multiple health risks of obesity as well as the benefit of weight loss to improve her health. She was advised of the need for long term treatment and the importance of lifestyle modifications to improve her current health and to decrease her risk of future health problems.  AGREE: Multiple dietary modification options and treatment options were discussed and  Lanetta agreed to follow the recommendations documented in the above note.  ARRANGE: Aritzel was educated on the importance of frequent visits to treat obesity as outlined per CMS and USPSTF guidelines and agreed to schedule her next follow up appointment today.  Migdalia Dk, am acting as transcriptionist for Abby Potash, PA-C I, Abby Potash, PA-C have reviewed above note and agree with its content

## 2019-03-16 ENCOUNTER — Encounter (INDEPENDENT_AMBULATORY_CARE_PROVIDER_SITE_OTHER): Payer: Self-pay

## 2019-03-16 ENCOUNTER — Other Ambulatory Visit (INDEPENDENT_AMBULATORY_CARE_PROVIDER_SITE_OTHER): Payer: Self-pay | Admitting: Physician Assistant

## 2019-03-16 DIAGNOSIS — E8881 Metabolic syndrome: Secondary | ICD-10-CM

## 2019-03-17 MED FILL — metFORMIN HCL 500 MG TABS: 500 | 30 days supply | Qty: 30 | Fill #0

## 2019-03-22 MED FILL — VIT D2 1.25 MG (50,000 UNIT: 1.25 MG | 28 days supply | Qty: 2 | Fill #0

## 2019-03-23 MED FILL — ESCITALOPRAM 10 MG TABLET: 10 | 90 days supply | Qty: 90 | Fill #0

## 2019-04-01 ENCOUNTER — Ambulatory Visit (INDEPENDENT_AMBULATORY_CARE_PROVIDER_SITE_OTHER): Payer: 59 | Admitting: Physician Assistant

## 2019-04-08 ENCOUNTER — Other Ambulatory Visit: Payer: Self-pay

## 2019-04-08 ENCOUNTER — Ambulatory Visit (INDEPENDENT_AMBULATORY_CARE_PROVIDER_SITE_OTHER): Payer: 59 | Admitting: Physician Assistant

## 2019-04-08 ENCOUNTER — Encounter (INDEPENDENT_AMBULATORY_CARE_PROVIDER_SITE_OTHER): Payer: Self-pay | Admitting: Physician Assistant

## 2019-04-08 DIAGNOSIS — E8881 Metabolic syndrome: Secondary | ICD-10-CM | POA: Diagnosis not present

## 2019-04-08 DIAGNOSIS — Z6835 Body mass index (BMI) 35.0-35.9, adult: Secondary | ICD-10-CM | POA: Diagnosis not present

## 2019-04-08 DIAGNOSIS — E559 Vitamin D deficiency, unspecified: Secondary | ICD-10-CM

## 2019-04-08 MED ORDER — METFORMIN HCL 500 MG PO TABS: 500.0000 mg | ORAL_TABLET | Freq: Every day | ORAL | 0 refills | Status: DC

## 2019-04-08 MED FILL — metFORMIN HCL 500 MG TABS: 500 | 30 days supply | Qty: 30 | Fill #0

## 2019-04-08 NOTE — Progress Notes (Signed)
Office: [phone]  /  Fax: [phone] TeleHealth Visit:  Hailey Noble has verbally consented to this TeleHealth visit today. The patient is located at home, the provider is located at the [employer] [facility] office. The participants in this visit include the listed provider and patient. The visit was conducted today via Webex.  HPI:   Chief Complaint: OBESITY [name] is here to discuss her progress with her obesity treatment plan. She is on the Category 2 plan and is following her eating plan approximately 90% of the time. She states she is walking 60 minutes 7 times per week. [name] reports that she has been walking more but wants to have structured strength training workouts. She states she is following the plan closely.  We were unable to weigh the patient today for this TeleHealth visit. She feels as if she has maintained her weight since her last visit. She has lost 0 lbs since starting treatment with [name].  Insulin Resistance [name] has a diagnosis of insulin resistance based on her elevated fasting insulin level >5. Although [name]'s blood glucose readings are still under good control, insulin resistance puts her at greater risk of metabolic syndrome and diabetes. She is currently on metformin and continues to work on diet and exercise to decrease risk of diabetes. No nausea, vomiting, or diarrhea on metformin. No polyphagia.  Vitamin D deficiency [name] has a diagnosis of Vitamin D deficiency. She is currently taking Vit D every 2 weeks and denies nausea, vomiting or muscle weakness.  ASSESSMENT AND PLAN:  Insulin resistance - Plan: metFORMIN (GLUCOPHAGE) 500 MG tablet  Vitamin D deficiency  Class 2 severe obesity with serious comorbidity and body mass index (BMI) of 35.0 to 35.9 in adult, unspecified obesity type ([place])  PLAN:  Insulin Resistance [name] will continue to work on weight loss, exercise, and decreasing simple carbohydrates in her diet to help decrease  the risk of diabetes. We dicussed metformin including benefits and risks. She was informed that eating too many simple carbohydrates or too many calories at one sitting increases the likelihood of GI side effects. [name] is currently taking metformin and a refill prescription was written today for #30 with 0 refills. [name] agrees to follow-up with our clinic in 2 weeks.   Vitamin D Deficiency [name] was informed that low Vitamin D levels contributes to fatigue and are associated with obesity, breast, and colon cancer. She agrees to continue taking Vit D every two weeks and will follow-up for routine testing of Vitamin D, at least 2-3 times per year. She was informed of the risk of over-replacement of Vitamin D and agrees to not increase her dose unless she discusses this with [name] first. [name] agrees to follow-up with our clinic in 2 weeks.  Obesity [name] is currently in the action stage of change. As such, her goal is to continue with weight loss efforts. She has agreed to follow the Category 2 plan. [name] has been instructed to work up to a goal of 150 minutes of combined cardio and strengthening exercise per week for weight loss and overall health benefits. We discussed the following Behavioral Modification Strategies today: increasing lean protein intake, work on meal planning and easy cooking plans.  [name] has agreed to follow-up with our clinic in 2 weeks. [name] did not have her May schedule and she will call back to schedule a 2 week appointment. She was informed of the importance of frequent follow-up visits to maximize her success with intensive lifestyle modifications for  her multiple health conditions.  ALLERGIES: Allergies  Allergen Reactions  . Coconut Fatty Acids Hives  . Codeine   . Hydrocod Polst-Cpm Polst Er     REACTION: rash on face and head, itching  . Mango Flavor Hives  . [name] Other (See Comments)  . Oxycodone-Acetaminophen     REACTION: rash on face and head,  itching  . Tussin Cough [Dextromethorphan Hbr]     Broken out in hives  . Ultram [Tramadol] Other (See Comments)    Dizzy and low blood pressure.     MEDICATIONS: Current Outpatient Medications on File Prior to Visit  Medication Sig Dispense Refill  . acetaminophen (TYLENOL) 650 MG CR tablet Take 650 mg by mouth every 8 (eight) hours as needed. pain    . Calcium Carbonate-Vit D-Min (CALCIUM 1200 PO) Take by mouth daily.      . cetirizine (ZYRTEC) 10 MG tablet Take 10 mg by mouth daily.    . Cholecalciferol (VITAMIN D-1000 MAX ST PO) Take 1 tablet by mouth.    . escitalopram (LEXAPRO) 10 MG tablet TAKE 1 TABLET BY MOUTH ONCE DAILY 90 tablet 3  . KRILL OIL 1000 MG CAPS Take 1 capsule by mouth daily.    . mometasone (NASONEX) 50 MCG/ACT nasal spray Place 2 sprays into the nose daily. 17 g 6  . Polyethyl Glycol-Propyl Glycol (SYSTANE FREE OP) Apply 1 drop to eye daily.    . Vitamin D, Ergocalciferol, (DRISDOL) 1.25 MG (50000 UT) CAPS capsule Take 1 capsule (50,000 Units total) by mouth every 14 (fourteen) days. 2 capsule 0   No current facility-administered medications on file prior to visit.     PAST MEDICAL HISTORY: Past Medical History:  Diagnosis Date  . Allergic state 12/08/2016  . Allergy   . Anxiety   . Back pain   . GERD (gastroesophageal reflux disease)   . Hyperlipidemia 06/09/2017  . Joint pain   . Knee pain, right 03/10/2017  . Plantar fasciitis   . Shortness of breath   . Sinusitis   . Vitamin D deficiency 11/22/2016  . Vitamin D deficiency     PAST SURGICAL HISTORY: Past Surgical History:  Procedure Laterality Date  . COLONOSCOPY  02-2009   with Henrene Pastor normal  . ESOPHAGOGASTRODUODENOSCOPY  2012  . GANGLION CYST EXCISION Right    right- wrist   . NASAL SEPTUM SURGERY    . NASAL SINUS SURGERY    . TMJ ARTHROSCOPY     left    SOCIAL HISTORY: Social History   Tobacco Use  . Smoking status: Never Smoker  . Smokeless tobacco: Never Used  Substance Use  Topics  . Alcohol use: Yes    Alcohol/week: 0.0 standard drinks    Comment: rare  . Drug use: No    FAMILY HISTORY: Family History  Problem Relation Age of Onset  . Hypertension Father   . Diabetes Father   . Hyperlipidemia Father   . Breast cancer Mother 72  . Cancer Mother        breast  . Thyroid disease Mother   . Sleep apnea Mother   . Colon cancer Paternal Grandfather        died at age 46  . Cancer Paternal Grandfather        GI CA  . Obesity Sister   . Diabetes Sister   . Hypertension Sister   . Heart disease Maternal Grandmother        MI  . Cancer Maternal Grandfather  lung, smoker  . Diabetes Maternal Grandfather   . Diabetes Paternal Grandmother   . Heart disease Paternal Grandmother   . Pancreatitis Sister   . Esophageal cancer Neg Hx   . Stomach cancer Neg Hx   . Stroke Neg Hx   . Colon polyps Neg Hx   . Rectal cancer Neg Hx   . Pancreatic cancer Neg Hx    ROS: Review of Systems  Gastrointestinal: Negative for diarrhea, nausea and vomiting.  Musculoskeletal:       Negative for muscle weakness.  Endo/Heme/Allergies:       Negative for polyphagia.   PHYSICAL EXAM: Pt in no acute distress  RECENT LABS AND TESTS: BMET    Component Value Date/Time   NA 140 12/30/2018 1004   K 4.4 12/30/2018 1004   CL 103 12/30/2018 1004   CO2 21 12/30/2018 1004   GLUCOSE 82 12/30/2018 1004   GLUCOSE 85 11/22/2016 1521   GLUCOSE 86 11/13/2009   BUN 18 12/30/2018 1004   CREATININE 0.58 12/30/2018 1004   CREATININE 0.67 11/22/2016 1521   CALCIUM 9.0 12/30/2018 1004   GFRNONAA 106 12/30/2018 1004   GFRAA 122 12/30/2018 1004   Lab Results  Component Value Date   HGBA1C 5.4 12/30/2018   HGBA1C 5.4 09/21/2018   HGBA1C 5.4 04/30/2018   HGBA1C 5.2 10/06/2017   HGBA1C 5.1 04/06/2014   Lab Results  Component Value Date   INSULIN 9.3 12/30/2018   INSULIN 11.1 09/21/2018   INSULIN 8.7 04/30/2018   INSULIN 8.8 10/06/2017   CBC    Component Value  Date/Time   WBC 9.6 10/06/2017 0850   WBC 9.8 11/22/2016 1521   RBC 4.71 10/06/2017 0850   RBC 4.41 11/22/2016 1521   HGB 13.8 10/06/2017 0850   HCT 42.4 10/06/2017 0850   PLT 328 11/22/2016 1521   PLT 381 11/13/2009   MCV 90 10/06/2017 0850   MCH 29.3 10/06/2017 0850   MCH 29.7 11/22/2016 1521   MCHC 32.5 10/06/2017 0850   MCHC 32.2 11/22/2016 1521   RDW 13.5 10/06/2017 0850   LYMPHSABS 1.8 10/06/2017 0850   MONOABS 0.5 05/26/2015 0931   EOSABS 0.1 10/06/2017 0850   BASOSABS 0.1 10/06/2017 0850   Iron/TIBC/Ferritin/ %Sat    Component Value Date/Time   FERRITIN 50 11/09/2008   Lipid Panel     Component Value Date/Time   CHOL 162 12/30/2018 1004   TRIG 109 12/30/2018 1004   TRIG 138 11/13/2009 1337   HDL 51 12/30/2018 1004   CHOLHDL 3.3 04/30/2018 1042   CHOLHDL 3.9 11/22/2016 1521   VLDL 32 (H) 11/22/2016 1521   LDLCALC 89 12/30/2018 1004   Hepatic Function Panel     Component Value Date/Time   PROT 6.7 12/30/2018 1004   ALBUMIN 4.2 12/30/2018 1004   AST 14 12/30/2018 1004   ALT 20 12/30/2018 1004   ALKPHOS 144 (H) 12/30/2018 1004   BILITOT 0.2 12/30/2018 1004   BILIDIR 0.1 05/26/2015 0931      Component Value Date/Time   TSH 1.690 10/06/2017 0850   TSH 1.30 11/22/2016 1521   TSH 1.67 05/26/2015 0931   Results for CAMPBettina, Warn I (MRN [id number]) as of 04/08/2019 11:16  Ref. Range 12/30/2018 10:04  Vitamin D, 25-Hydroxy Latest Ref Range: 30.0 - 100.0 ng/mL 57.9    I, Michaelene Song, am acting as Location manager for [employer], PA-C I, Abby Potash, PA-C have reviewed above note and agree with its content

## 2019-05-04 ENCOUNTER — Other Ambulatory Visit: Payer: Self-pay

## 2019-05-04 ENCOUNTER — Ambulatory Visit (INDEPENDENT_AMBULATORY_CARE_PROVIDER_SITE_OTHER): Payer: 59 | Admitting: Physician Assistant

## 2019-05-04 DIAGNOSIS — E8881 Metabolic syndrome: Secondary | ICD-10-CM | POA: Diagnosis not present

## 2019-05-04 DIAGNOSIS — Z6835 Body mass index (BMI) 35.0-35.9, adult: Secondary | ICD-10-CM

## 2019-05-04 DIAGNOSIS — E559 Vitamin D deficiency, unspecified: Secondary | ICD-10-CM

## 2019-05-04 MED ORDER — METFORMIN HCL 500 MG PO TABS: 500.0000 mg | ORAL_TABLET | Freq: Every day | ORAL | 0 refills | Status: DC

## 2019-05-04 MED ORDER — VITAMIN D (ERGOCALCIFEROL) 1.25 MG (50000 UNIT) PO CAPS: 50000.0000 [IU] | ORAL_CAPSULE | ORAL | 0 refills | Status: DC

## 2019-05-04 MED FILL — metFORMIN HCL 500 MG TABS: 500 | 30 days supply | Qty: 30 | Fill #0

## 2019-05-04 MED FILL — VIT D2 1.25 MG (50,000 UNIT: 1.25 MG | 28 days supply | Qty: 2 | Fill #0

## 2019-05-04 NOTE — Progress Notes (Signed)
Office: [phone]  /  Fax: [phone] TeleHealth Visit:  Hailey Noble has verbally consented to this TeleHealth visit today. The patient is located at home, the provider is located at the [employer] [facility] office. The participants in this visit include the listed provider and patient. The visit was conducted today via Webex.  HPI:   Chief Complaint: OBESITY Hailey is here to discuss her progress with her obesity treatment plan. She is on the Category 2 plan and is following her eating plan approximately 90% of the time. She states she is walking 60 minutes 4 times per week and doing Peloton step bench 30-60 minutes 3 times per week. Hailey Noble reports that she is doing well overall. She is journaling at times but not every day.  We were unable to weigh the patient today for this TeleHealth visit. She feels as if she has maintained her weight since her last visit. She has lost 0 lbs since starting treatment with [name].  Vitamin D deficiency Hailey Noble has a diagnosis of Vitamin D deficiency. She is currently taking prescription Vit D and denies nausea, vomiting or muscle weakness.  Insulin Resistance Hailey Noble has a diagnosis of insulin resistance based on her elevated fasting insulin level >5. Although Hailey Noble's blood glucose readings are still under good control, insulin resistance puts her at greater risk of metabolic syndrome and diabetes. She is taking metformin currently and continues to work on diet and exercise to decrease risk of diabetes. No nausea, vomiting, or diarrhea on metformin. No polyphagia.  ASSESSMENT AND PLAN:  Vitamin D deficiency - Plan: Vitamin D, Ergocalciferol, (DRISDOL) 1.25 MG (50000 UT) CAPS capsule  Insulin resistance - Plan: metFORMIN (GLUCOPHAGE) 500 MG tablet  Class 2 severe obesity with serious comorbidity and body mass index (BMI) of 35.0 to 35.9 in adult, unspecified obesity type ([name])  PLAN:  Vitamin D Deficiency Hailey Noble was informed that low Vitamin  D levels contributes to fatigue and are associated with obesity, breast, and colon cancer. She agrees to continue to take prescription Vit D @ 50,000 IU every 14 days #2 with 0 refills and will follow-up for routine testing of Vitamin D, at least 2-3 times per year. She was informed of the risk of over-replacement of Vitamin D and agrees to not increase her dose unless she discusses this with [name] first. Hailey Noble agrees to follow-up with our clinic in 2 weeks.  Insulin Resistance Hailey Noble will continue to work on weight loss, exercise, and decreasing simple carbohydrates in her diet to help decrease the risk of diabetes. We dicussed metformin including benefits and risks. She was informed that eating too many simple carbohydrates or too many calories at one sitting increases the likelihood of GI side effects. Hailey Noble is currently taking metformin and a refill prescription was written today for #30 with 0 refills. Hailey Noble agrees to follow-up with our clinic in 2 weeks.  Obesity Hailey Noble is currently in the action stage of change. As such, her goal is to continue with weight loss efforts. She has agreed to follow the Category 2 plan or journal 1200 calories + 85 grams of protein daily. Hailey Noble has been instructed to work up to a goal of 150 minutes of combined cardio and strengthening exercise per week for weight loss and overall health benefits. We discussed the following Behavioral Modification Strategies today: work on meal planning, easy cooking plans, and keeping healthy foods in the home.  Hailey Noble has agreed to follow-up with our clinic in 2 weeks. She was informed  of the importance of frequent follow-up visits to maximize her success with intensive lifestyle modifications for her multiple health conditions.  ALLERGIES: Allergies  Allergen Reactions  . Coconut Fatty Acids Hives  . Codeine   . Hydrocod Polst-Cpm Polst Er     REACTION: rash on face and head, itching  . Mango Flavor Hives  . [name]  Other (See Comments)  . Oxycodone-Acetaminophen     REACTION: rash on face and head, itching  . Tussin Cough [Dextromethorphan Hbr]     Broken out in hives  . Ultram [Tramadol] Other (See Comments)    Dizzy and low blood pressure.     MEDICATIONS: Current Outpatient Medications on File Prior to Visit  Medication Sig Dispense Refill  . acetaminophen (TYLENOL) 650 MG CR tablet Take 650 mg by mouth every 8 (eight) hours as needed. pain    . Calcium Carbonate-Vit D-Min (CALCIUM 1200 PO) Take by mouth daily.      . cetirizine (ZYRTEC) 10 MG tablet Take 10 mg by mouth daily.    . Cholecalciferol (VITAMIN D-1000 MAX ST PO) Take 1 tablet by mouth.    . escitalopram (LEXAPRO) 10 MG tablet TAKE 1 TABLET BY MOUTH ONCE DAILY 90 tablet 3  . KRILL OIL 1000 MG CAPS Take 1 capsule by mouth daily.    . mometasone (NASONEX) 50 MCG/ACT nasal spray Place 2 sprays into the nose daily. 17 g 6  . Polyethyl Glycol-Propyl Glycol (SYSTANE FREE OP) Apply 1 drop to eye daily.     No current facility-administered medications on file prior to visit.     PAST MEDICAL HISTORY: Past Medical History:  Diagnosis Date  . Allergic state 12/08/2016  . Allergy   . Anxiety   . Back pain   . GERD (gastroesophageal reflux disease)   . Hyperlipidemia 06/09/2017  . Joint pain   . Knee pain, right 03/10/2017  . Plantar fasciitis   . Shortness of breath   . Sinusitis   . Vitamin D deficiency 11/22/2016  . Vitamin D deficiency     PAST SURGICAL HISTORY: Past Surgical History:  Procedure Laterality Date  . COLONOSCOPY  02-2009   with Henrene Pastor normal  . ESOPHAGOGASTRODUODENOSCOPY  2012  . GANGLION CYST EXCISION Right    right- wrist   . NASAL SEPTUM SURGERY    . NASAL SINUS SURGERY    . TMJ ARTHROSCOPY     left    SOCIAL HISTORY: Social History   Tobacco Use  . Smoking status: Never Smoker  . Smokeless tobacco: Never Used  Substance Use Topics  . Alcohol use: Yes    Alcohol/week: 0.0 standard drinks     Comment: rare  . Drug use: No    FAMILY HISTORY: Family History  Problem Relation Age of Onset  . Hypertension Father   . Diabetes Father   . Hyperlipidemia Father   . Breast cancer Mother 39  . Cancer Mother        breast  . Thyroid disease Mother   . Sleep apnea Mother   . Colon cancer Paternal Grandfather        died at age 31  . Cancer Paternal Grandfather        GI CA  . Obesity Sister   . Diabetes Sister   . Hypertension Sister   . Heart disease Maternal Grandmother        MI  . Cancer Maternal Grandfather        lung, smoker  . Diabetes Maternal  Grandfather   . Diabetes Paternal Grandmother   . Heart disease Paternal Grandmother   . Pancreatitis Sister   . Esophageal cancer Neg Hx   . Stomach cancer Neg Hx   . Stroke Neg Hx   . Colon polyps Neg Hx   . Rectal cancer Neg Hx   . Pancreatic cancer Neg Hx    ROS: Review of Systems  Gastrointestinal: Negative for diarrhea, nausea and vomiting.  Musculoskeletal:       Negative for muscle weakness.  Endo/Heme/Allergies:       Negative for polyphagia.   PHYSICAL EXAM: Pt in no acute distress  RECENT LABS AND TESTS: BMET    Component Value Date/Time   NA 140 12/30/2018 1004   K 4.4 12/30/2018 1004   CL 103 12/30/2018 1004   CO2 21 12/30/2018 1004   GLUCOSE 82 12/30/2018 1004   GLUCOSE 85 11/22/2016 1521   GLUCOSE 86 11/13/2009   BUN 18 12/30/2018 1004   CREATININE 0.58 12/30/2018 1004   CREATININE 0.67 11/22/2016 1521   CALCIUM 9.0 12/30/2018 1004   GFRNONAA 106 12/30/2018 1004   GFRAA 122 12/30/2018 1004   Lab Results  Component Value Date   HGBA1C 5.4 12/30/2018   HGBA1C 5.4 09/21/2018   HGBA1C 5.4 04/30/2018   HGBA1C 5.2 10/06/2017   HGBA1C 5.1 04/06/2014   Lab Results  Component Value Date   INSULIN 9.3 12/30/2018   INSULIN 11.1 09/21/2018   INSULIN 8.7 04/30/2018   INSULIN 8.8 10/06/2017   CBC    Component Value Date/Time   WBC 9.6 10/06/2017 0850   WBC 9.8 11/22/2016 1521   RBC  4.71 10/06/2017 0850   RBC 4.41 11/22/2016 1521   HGB 13.8 10/06/2017 0850   HCT 42.4 10/06/2017 0850   PLT 328 11/22/2016 1521   PLT 381 11/13/2009   MCV 90 10/06/2017 0850   MCH 29.3 10/06/2017 0850   MCH 29.7 11/22/2016 1521   MCHC 32.5 10/06/2017 0850   MCHC 32.2 11/22/2016 1521   RDW 13.5 10/06/2017 0850   LYMPHSABS 1.8 10/06/2017 0850   MONOABS 0.5 05/26/2015 0931   EOSABS 0.1 10/06/2017 0850   BASOSABS 0.1 10/06/2017 0850   Iron/TIBC/Ferritin/ %Sat    Component Value Date/Time   FERRITIN 50 11/09/2008   Lipid Panel     Component Value Date/Time   CHOL 162 12/30/2018 1004   TRIG 109 12/30/2018 1004   TRIG 138 11/13/2009 1337   HDL 51 12/30/2018 1004   CHOLHDL 3.3 04/30/2018 1042   CHOLHDL 3.9 11/22/2016 1521   VLDL 32 (H) 11/22/2016 1521   LDLCALC 89 12/30/2018 1004   Hepatic Function Panel     Component Value Date/Time   PROT 6.7 12/30/2018 1004   ALBUMIN 4.2 12/30/2018 1004   AST 14 12/30/2018 1004   ALT 20 12/30/2018 1004   ALKPHOS 144 (H) 12/30/2018 1004   BILITOT 0.2 12/30/2018 1004   BILIDIR 0.1 05/26/2015 0931      Component Value Date/Time   TSH 1.690 10/06/2017 0850   TSH 1.30 11/22/2016 1521   TSH 1.67 05/26/2015 0931   Results for CAMPJaneli, Lewison I (MRN [id number]) as of 05/04/2019 14:21  Ref. Range 12/30/2018 10:04  Vitamin D, 25-Hydroxy Latest Ref Range: 30.0 - 100.0 ng/mL 57.9    I, Michaelene Song, am acting as Location manager for [employer], PA-C I, Abby Potash, PA-C have reviewed above note and agree with its content

## 2019-05-19 ENCOUNTER — Telehealth: Payer: 59 | Admitting: Family

## 2019-05-19 DIAGNOSIS — L255 Unspecified contact dermatitis due to plants, except food: Secondary | ICD-10-CM | POA: Diagnosis not present

## 2019-05-19 MED ORDER — TRIAMCINOLONE ACETONIDE 0.5 % EX OINT: 1.0000 "application " | TOPICAL_OINTMENT | Freq: Two times a day (BID) | CUTANEOUS | 0 refills | Status: DC

## 2019-05-19 MED ORDER — PREDNISONE 10 MG (21) PO TBPK: ORAL_TABLET | ORAL | 0 refills | Status: DC

## 2019-05-19 MED FILL — predniSONE 10 MG TABS: 10 | 6 days supply | Qty: 21 | Fill #0

## 2019-05-19 MED FILL — TRIAMCINOLONE 0.5% OINTMENT: 0.5 | 15 days supply | Qty: 30 | Fill #0

## 2019-05-19 NOTE — Progress Notes (Signed)
We are sorry that you are not feeing well.  Here is how we plan to help!  Approximately 5 minutes was spent documenting and reviewing patient's chart.    Based on what you have shared with me it looks like you have had an allergic reaction to the oily resin from a group of plants.  This resin is very sticky, so it easily attaches to your skin, clothing, tools equipment, and pet's fur.    This blistering rash is often called poison ivy rash although it can come from contact with the leaves, stems and roots of poison ivy, poison oak and poison sumac.  The oily resin contains urushiol (u-ROO-she-ol) that produces a skin rash on exposed skin.  The severity of the rash depends on the amount of urushiol that gets on your skin.  A section of skin with more urushiol on it may develop a rash sooner.  The rash usually develops 12-48 hours after exposure and can last two to three weeks.  Your skin must come in direct contact with the plant's oil to be affected.  Blister fluid doesn't spread the rash.  However, if you come into contact with a piece of clothing or pet fur that has urushiol on it, the rash may spread out.  You can also transfer the oil to other parts of your body with your fingers.  Often the rash looks like a straight line because of the way the plant brushes against your skin.    I have developed the following plan to treat your condition Since your rash is widespread or has resulted in a large number of blisters, I have prescribed an oral corticosteroid.  Please follow these recommendations:  I have sent a prednisone dose pack to your chosen pharmacy. Be sure to follow the instructions carefully and complete the entire prescription. You may use Benadryl or Caladryl topical lotions to sooth the itch and remember cool, not hot, showers and baths can help relieve the itching!  Place cool, wet compresses on the affected area for 15-30 minutes several times a day.  You may also take oral  antihistamines, such as diphenhydramine (Benadryl, others), which may also help you sleep better.  Watch your skin for any purulent (pus) drainage or red streaking from the site.  If this occurs, contact your provider.  You may require an antibiotic for a skin infection.  Make sure that the clothes you were wearing as well as any towels or sheets that may have come in contact with the oil (urushiol) are washed in detergent and hot water.     I have also sent in a kenalog cream that you can use twice a day for itching. Avoid applying to your face or groin.     What can you do to prevent this rash?  Avoid the plants.  Learn how to identify poison ivy, poison oak and poison sumac in all seasons.  When hiking or engaging in other activities that might expose you to these plants, try to stay on cleared pathways.  If camping, make sure you pitch your tent in an area free of these plants.  Keep pets from running through wooded areas so that urushiol doesn't accidentally stick to their fur, which you may touch.  Remove or kill the plants.  In your yard, you can get rid of poison ivy by applying an herbicide or pulling it out of the ground, including the roots, while wearing heavy gloves.  Afterward remove the gloves and thoroughly  wash them and your hands.  Don't burn poison ivy or related plants because the urushiol can be carried by smoke.  Wear protective clothing.  If needed, protect your skin by wearing socks, boots, pants, long sleeves and vinyl gloves.  Wash your skin right away.  Washing off the oil with soap and water within 30 minutes of exposure may reduce your chances of getting a poison ivy rash.  Even washing after an hour or so can help reduce the severity of the rash.  If you walk through some poison ivy and then later touch your shoes, you may get some urushiol on your hands, which may then transfer to your face or body by touching or rubbing.  If the contaminated object isn't cleaned, the  urushiol on it can still cause a skin reaction years later.    Be careful not to reuse towels after you have washed your skin.  Also carefully wash clothing in detergent and hot water to remove all traces of the oil.  Handle contaminated clothing carefully so you don't transfer the urushiol to yourself, furniture, rugs or appliances.  Remember that pets can carry the oil on their fur and paws.  If you think your pet may be contaminated with urushiol, put on some long rubber gloves and give your pet a bath.  Finally, be careful not to burn these plants as the smoke can contain traces of the oil.  Inhaling the smoke may result in difficulty breathing. If that occurred you should see a physician as soon as possible.  See your doctor right away if:   The reaction is severe or widespread  You inhaled the smoke from burning poison ivy and are having difficulty breathing  Your skin continues to swell  The rash affects your eyes, mouth or genitals  Blisters are oozing pus  You develop a fever greater than 100 F (37.8 C)  The rash doesn't get better within a few weeks.  If you scratch the poison ivy rash, bacteria under your fingernails may cause the skin to become infected.  See your doctor if pus starts oozing from the blisters.  Treatment generally includes antibiotics.  Poison ivy treatments are usually limited to self-care methods.  And the rash typically goes away on its own in two to three weeks.     If the rash is widespread or results in a large number of blisters, your doctor may prescribe an oral corticosteroid, such as prednisone.  If a bacterial infection has developed at the rash site, your doctor may give you a prescription for an oral antibiotic.  MAKE SURE YOU   Understand these instructions.  Will watch your condition.  Will get help right away if you are not doing well or get worse.  Thank you for choosing an e-visit. Your e-visit answers were reviewed by a board  certified advanced clinical practitioner to complete your personal care plan. Depending upon the condition, your plan could have included both over the counter or prescription medications.  Please review your pharmacy choice. If there is a problem you may use MyChart messaging to have the prescription routed to another pharmacy.   Your safety is important to [name]. If you have drug allergies check your prescription carefully.  You can use MyChart to ask questions about today's visit, request a non-urgent call back, or ask for a work or school excuse for 24 hours related to this e-Visit. If it has been greater than 24 hours you will need to  follow up with your provider, or enter a new e-Visit to address those concerns.   You will get an email in the next two days asking about your experience. I hope that your e-visit has been valuable and will speed your recovery Thank you for choosing an e-visit.

## 2019-05-20 ENCOUNTER — Ambulatory Visit (INDEPENDENT_AMBULATORY_CARE_PROVIDER_SITE_OTHER): Payer: 59 | Admitting: Physician Assistant

## 2019-05-20 ENCOUNTER — Other Ambulatory Visit: Payer: Self-pay

## 2019-05-20 ENCOUNTER — Encounter (INDEPENDENT_AMBULATORY_CARE_PROVIDER_SITE_OTHER): Payer: Self-pay | Admitting: Physician Assistant

## 2019-05-20 DIAGNOSIS — Z6835 Body mass index (BMI) 35.0-35.9, adult: Secondary | ICD-10-CM

## 2019-05-20 DIAGNOSIS — E559 Vitamin D deficiency, unspecified: Secondary | ICD-10-CM

## 2019-05-23 NOTE — Progress Notes (Signed)
Office: [phone]  /  Fax: [phone] TeleHealth Visit:  LUCETTE KRATZ has verbally consented to this TeleHealth visit today. The patient is located at home, the provider is located at the [facility] office. The participants in this visit include the listed provider and patient. Sharita was unable to use realtime audiovisual technology today and the telehealth visit was conducted via telephone.   HPI:   Chief Complaint: OBESITY Hailey Noble is here to discuss her progress with her obesity treatment plan. She is on the keep a food journal with 1200 calories and 85 grams of protein daily or follow the Category 2 plan and is following her eating plan approximately 90 % of the time. She states she is exercising with Peloton app for 30-40 minutes 3 times per week. Ashley reports her weight to be 214 lbs. She is journaling daily and enjoying it. She is going over her 1200 calories intermittently, but doing a good job getting her protein in.  We were unable to weigh the patient today for this TeleHealth visit. She is unsure if she has lost or gained weight since her last visit. She has lost 0 lbs since starting treatment with [name].  Vitamin D Deficiency Keenan has a diagnosis of vitamin D deficiency. She is currently taking prescription Vit D. She denies nausea, vomiting or muscle weakness.  ASSESSMENT AND PLAN:  Vitamin D deficiency  Class 2 severe obesity with serious comorbidity and body mass index (BMI) of 35.0 to 35.9 in adult, unspecified obesity type ([place])  PLAN:  Vitamin D Deficiency Shameika was informed that low vitamin D levels contributes to fatigue and are associated with obesity, breast, and colon cancer. Gracielynn agrees to continue taking prescription Vit D @50 ,000 IU every 14 days and will follow up for routine testing of vitamin D, at least 2-3 times per year. She was informed of the risk of over-replacement of vitamin D and agrees to not increase her dose unless she  discusses this with [name] first. Saara agrees to follow up with our clinic in 2 weeks.  Obesity Zenia is currently in the action stage of change. As such, her goal is to continue with weight loss efforts She has agreed to keep a food journal with 1200 calories and 85 grams of protein daily Kelle has been instructed to work up to a goal of 150 minutes of combined cardio and strengthening exercise per week for weight loss and overall health benefits. We discussed the following Behavioral Modification Strategies today: work on meal planning and easy cooking plans and keeping healthy foods in the home   Alonie has agreed to follow up with our clinic in 2 weeks. She was informed of the importance of frequent follow up visits to maximize her success with intensive lifestyle modifications for her multiple health conditions.  ALLERGIES: Allergies  Allergen Reactions  . Coconut Fatty Acids Hives  . Codeine   . Hydrocod Polst-Cpm Polst Er     REACTION: rash on face and head, itching  . Mango Flavor Hives  . [name] Other (See Comments)  . Oxycodone-Acetaminophen     REACTION: rash on face and head, itching  . Tussin Cough [Dextromethorphan Hbr]     Broken out in hives  . Ultram [Tramadol] Other (See Comments)    Dizzy and low blood pressure.     MEDICATIONS: Current Outpatient Medications on File Prior to Visit  Medication Sig Dispense Refill  . acetaminophen (TYLENOL) 650 MG CR tablet Take 650 mg by  mouth every 8 (eight) hours as needed. pain    . Calcium Carbonate-Vit D-Min (CALCIUM 1200 PO) Take by mouth daily.      . cetirizine (ZYRTEC) 10 MG tablet Take 10 mg by mouth daily.    . Cholecalciferol (VITAMIN D-1000 MAX ST PO) Take 1 tablet by mouth.    . escitalopram (LEXAPRO) 10 MG tablet TAKE 1 TABLET BY MOUTH ONCE DAILY 90 tablet 3  . KRILL OIL 1000 MG CAPS Take 1 capsule by mouth daily.    . metFORMIN (GLUCOPHAGE) 500 MG tablet Take 1 tablet (500 mg total) by mouth daily with  breakfast. 30 tablet 0  . mometasone (NASONEX) 50 MCG/ACT nasal spray Place 2 sprays into the nose daily. 17 g 6  . Polyethyl Glycol-Propyl Glycol (SYSTANE FREE OP) Apply 1 drop to eye daily.    . predniSONE (STERAPRED UNI-PAK 21 TAB) 10 MG (21) TBPK tablet Use as directed 21 tablet 0  . triamcinolone ointment (KENALOG) 0.5 % Apply 1 application topically 2 (two) times daily. 30 g 0  . Vitamin D, Ergocalciferol, (DRISDOL) 1.25 MG (50000 UT) CAPS capsule Take 1 capsule (50,000 Units total) by mouth every 14 (fourteen) days. 2 capsule 0   No current facility-administered medications on file prior to visit.     PAST MEDICAL HISTORY: Past Medical History:  Diagnosis Date  . Allergic state 12/08/2016  . Allergy   . Anxiety   . Back pain   . GERD (gastroesophageal reflux disease)   . Hyperlipidemia 06/09/2017  . Joint pain   . Knee pain, right 03/10/2017  . Plantar fasciitis   . Shortness of breath   . Sinusitis   . Vitamin D deficiency 11/22/2016  . Vitamin D deficiency     PAST SURGICAL HISTORY: Past Surgical History:  Procedure Laterality Date  . COLONOSCOPY  02-2009   with Henrene Pastor normal  . ESOPHAGOGASTRODUODENOSCOPY  2012  . GANGLION CYST EXCISION Right    right- wrist   . NASAL SEPTUM SURGERY    . NASAL SINUS SURGERY    . TMJ ARTHROSCOPY     left    SOCIAL HISTORY: Social History   Tobacco Use  . Smoking status: Never Smoker  . Smokeless tobacco: Never Used  Substance Use Topics  . Alcohol use: Yes    Alcohol/week: 0.0 standard drinks    Comment: rare  . Drug use: No    FAMILY HISTORY: Family History  Problem Relation Age of Onset  . Hypertension Father   . Diabetes Father   . Hyperlipidemia Father   . Breast cancer Mother 75  . Cancer Mother        breast  . Thyroid disease Mother   . Sleep apnea Mother   . Colon cancer Paternal Grandfather        died at age 30  . Cancer Paternal Grandfather        GI CA  . Obesity Sister   . Diabetes Sister   .  Hypertension Sister   . Heart disease Maternal Grandmother        MI  . Cancer Maternal Grandfather        lung, smoker  . Diabetes Maternal Grandfather   . Diabetes Paternal Grandmother   . Heart disease Paternal Grandmother   . Pancreatitis Sister   . Esophageal cancer Neg Hx   . Stomach cancer Neg Hx   . Stroke Neg Hx   . Colon polyps Neg Hx   . Rectal cancer Neg Hx   .  Pancreatic cancer Neg Hx     ROS: Review of Systems  Constitutional: Negative for weight loss.  Gastrointestinal: Negative for nausea and vomiting.  Musculoskeletal:       Negative muscle weakness    PHYSICAL EXAM: Pt in no acute distress  RECENT LABS AND TESTS: BMET    Component Value Date/Time   NA 140 12/30/2018 1004   K 4.4 12/30/2018 1004   CL 103 12/30/2018 1004   CO2 21 12/30/2018 1004   GLUCOSE 82 12/30/2018 1004   GLUCOSE 85 11/22/2016 1521   GLUCOSE 86 11/13/2009   BUN 18 12/30/2018 1004   CREATININE 0.58 12/30/2018 1004   CREATININE 0.67 11/22/2016 1521   CALCIUM 9.0 12/30/2018 1004   GFRNONAA 106 12/30/2018 1004   GFRAA 122 12/30/2018 1004   Lab Results  Component Value Date   HGBA1C 5.4 12/30/2018   HGBA1C 5.4 09/21/2018   HGBA1C 5.4 04/30/2018   HGBA1C 5.2 10/06/2017   HGBA1C 5.1 04/06/2014   Lab Results  Component Value Date   INSULIN 9.3 12/30/2018   INSULIN 11.1 09/21/2018   INSULIN 8.7 04/30/2018   INSULIN 8.8 10/06/2017   CBC    Component Value Date/Time   WBC 9.6 10/06/2017 0850   WBC 9.8 11/22/2016 1521   RBC 4.71 10/06/2017 0850   RBC 4.41 11/22/2016 1521   HGB 13.8 10/06/2017 0850   HCT 42.4 10/06/2017 0850   PLT 328 11/22/2016 1521   PLT 381 11/13/2009   MCV 90 10/06/2017 0850   MCH 29.3 10/06/2017 0850   MCH 29.7 11/22/2016 1521   MCHC 32.5 10/06/2017 0850   MCHC 32.2 11/22/2016 1521   RDW 13.5 10/06/2017 0850   LYMPHSABS 1.8 10/06/2017 0850   MONOABS 0.5 05/26/2015 0931   EOSABS 0.1 10/06/2017 0850   BASOSABS 0.1 10/06/2017 0850    Iron/TIBC/Ferritin/ %Sat    Component Value Date/Time   FERRITIN 50 11/09/2008   Lipid Panel     Component Value Date/Time   CHOL 162 12/30/2018 1004   TRIG 109 12/30/2018 1004   TRIG 138 11/13/2009 1337   HDL 51 12/30/2018 1004   CHOLHDL 3.3 04/30/2018 1042   CHOLHDL 3.9 11/22/2016 1521   VLDL 32 (H) 11/22/2016 1521   LDLCALC 89 12/30/2018 1004   Hepatic Function Panel     Component Value Date/Time   PROT 6.7 12/30/2018 1004   ALBUMIN 4.2 12/30/2018 1004   AST 14 12/30/2018 1004   ALT 20 12/30/2018 1004   ALKPHOS 144 (H) 12/30/2018 1004   BILITOT 0.2 12/30/2018 1004   BILIDIR 0.1 05/26/2015 0931      Component Value Date/Time   TSH 1.690 10/06/2017 0850   TSH 1.30 11/22/2016 1521   TSH 1.67 05/26/2015 0931      I, Trixie Dredge, am acting as transcriptionist for Abby Potash, PA-C I, Abby Potash, PA-C have reviewed above note and agree with its content

## 2019-06-02 ENCOUNTER — Encounter (INDEPENDENT_AMBULATORY_CARE_PROVIDER_SITE_OTHER): Payer: Self-pay | Admitting: Family Medicine

## 2019-06-02 ENCOUNTER — Ambulatory Visit (INDEPENDENT_AMBULATORY_CARE_PROVIDER_SITE_OTHER): Payer: 59 | Admitting: Family Medicine

## 2019-06-02 ENCOUNTER — Other Ambulatory Visit: Payer: Self-pay

## 2019-06-02 DIAGNOSIS — E8881 Metabolic syndrome: Secondary | ICD-10-CM

## 2019-06-02 DIAGNOSIS — E559 Vitamin D deficiency, unspecified: Secondary | ICD-10-CM

## 2019-06-02 DIAGNOSIS — E88819 Insulin resistance, unspecified: Secondary | ICD-10-CM

## 2019-06-02 DIAGNOSIS — Z6835 Body mass index (BMI) 35.0-35.9, adult: Secondary | ICD-10-CM | POA: Diagnosis not present

## 2019-06-02 MED ORDER — VITAMIN D (ERGOCALCIFEROL) 1.25 MG (50000 UNIT) PO CAPS: 50000.0000 [IU] | ORAL_CAPSULE | ORAL | 0 refills | Status: DC

## 2019-06-02 MED FILL — VIT D2 1.25 MG (50,000 UNIT: 1.25 MG | 28 days supply | Qty: 2 | Fill #0

## 2019-06-03 NOTE — Progress Notes (Signed)
Office: [phone]  /  Fax: [phone] TeleHealth Visit:  Hailey Noble has verbally consented to this TeleHealth visit today. The patient is located at home, the provider is located at the [employer] [facility] office. The participants in this visit include the listed provider and patient. Alizaya was unable to use realtime audiovisual technology today and the telehealth visit was conducted via telephone(call was 25 minutes).  HPI:   Chief Complaint: OBESITY Hailey Noble is here to discuss her progress with her obesity treatment plan. She is on the Category 2 plan and is following her eating plan approximately 90 % of the time. She states she is walking 45 to 60 minutes 3 to 4 times per week. Hailey Noble is 213 pounds today and she has lost 1 pound since her last telehealth visit. She is doing a hybrid of Category 2 and journaling. Hailey Noble is keeping a calories to 1200 daily. She is considering going to [facility] and pay to swim.  We were unable to weigh the patient today for this TeleHealth visit. She feels as if she has gained weight since her last visit. She has lost 0 lbs since starting treatment with [name].  Vitamin D Deficiency Hailey Noble has a diagnosis of vitamin D deficiency. She is currently  on vit D. Her last level was 57.9 on 12/30/18 and is at goal. [name] denies nausea, vomiting, or muscle weakness.  Insulin Resistance Hailey Noble has a diagnosis of insulin resistance based on her elevated fasting insulin level >5. Although Hailey Noble's blood glucose readings are still under good control, insulin resistance puts her at greater risk of metabolic syndrome and diabetes. She is taking metformin currently and is having occasional polyphagia in the afternoon. She continues to work on diet and exercise to decrease risk of diabetes.  Lab Results  Component Value Date   HGBA1C 5.4 12/30/2018    ASSESSMENT AND PLAN:  Vitamin D deficiency - Plan: Vitamin D, Ergocalciferol, (DRISDOL) 1.25 MG  (50000 UT) CAPS capsule  Insulin resistance  Class 2 severe obesity with serious comorbidity and body mass index (BMI) of 35.0 to 35.9 in adult, unspecified obesity type ([place])  PLAN:  Vitamin D Deficiency [place] was informed that low vitamin D levels contribute to fatigue and are associated with obesity, breast, and colon cancer. [name] agrees to continue to take prescription Vit D @50 ,000 IU every week #4 with no refills and will follow up for routine testing of vitamin D, at least 2-3 times per year. She was informed of the risk of over-replacement of vitamin D and agrees to not increase her dose unless she discusses this with [name] first. [name] agrees to follow up in 2 weeks as directed.  Insulin Resistance Hailey Noble will continue to work on weight loss, exercise, and decreasing simple carbohydrates in her diet to help decrease the risk of diabetes. She was informed that eating too many simple carbohydrates or too many calories at one sitting increases the likelihood of GI side effects. Hailey Noble agreed to increase metformin 500 mg BID with breakfast and lunch #60 with no refill needed. Hailey Noble agreed to follow up with [name] as directed to monitor her progress.   Obesity Hailey Noble is currently in the action stage of change. As such, her goal is to continue with weight loss efforts. She has agreed to keep a food journal and reduce calories to 1100 to 1200 calories and 85 grams of protein.  Hailey Noble has been instructed to continue her current exercise regimen of walking. We discussed  the following Behavioral Modification Strategies today: dealing with family or coworker sabotage, planning for success, and keep a strict food journal.  Hailey Noble has agreed to follow up with our clinic in 2 weeks. She was informed of the importance of frequent follow up visits to maximize her success with intensive lifestyle modifications for her multiple health conditions.  ALLERGIES: Allergies  Allergen Reactions  . Coconut  Fatty Acids Hives  . Codeine   . Hydrocod Polst-Cpm Polst Er     REACTION: rash on face and head, itching  . Mango Flavor Hives  . [name] Other (See Comments)  . Oxycodone-Acetaminophen     REACTION: rash on face and head, itching  . Tussin Cough [Dextromethorphan Hbr]     Broken out in hives  . Ultram [Tramadol] Other (See Comments)    Dizzy and low blood pressure.     MEDICATIONS: Current Outpatient Medications on File Prior to Visit  Medication Sig Dispense Refill  . acetaminophen (TYLENOL) 650 MG CR tablet Take 650 mg by mouth every 8 (eight) hours as needed. pain    . Calcium Carbonate-Vit D-Min (CALCIUM 1200 PO) Take by mouth daily.      . cetirizine (ZYRTEC) 10 MG tablet Take 10 mg by mouth daily.    . Cholecalciferol (VITAMIN D-1000 MAX ST PO) Take 1 tablet by mouth.    . escitalopram (LEXAPRO) 10 MG tablet TAKE 1 TABLET BY MOUTH ONCE DAILY 90 tablet 3  . KRILL OIL 1000 MG CAPS Take 1 capsule by mouth daily.    . metFORMIN (GLUCOPHAGE) 500 MG tablet Take 1 tablet (500 mg total) by mouth daily with breakfast. 30 tablet 0  . mometasone (NASONEX) 50 MCG/ACT nasal spray Place 2 sprays into the nose daily. 17 g 6  . Polyethyl Glycol-Propyl Glycol (SYSTANE FREE OP) Apply 1 drop to eye daily.    . predniSONE (STERAPRED UNI-PAK 21 TAB) 10 MG (21) TBPK tablet Use as directed 21 tablet 0  . triamcinolone ointment (KENALOG) 0.5 % Apply 1 application topically 2 (two) times daily. 30 g 0   No current facility-administered medications on file prior to visit.     PAST MEDICAL HISTORY: Past Medical History:  Diagnosis Date  . Allergic state 12/08/2016  . Allergy   . Anxiety   . Back pain   . GERD (gastroesophageal reflux disease)   . Hyperlipidemia 06/09/2017  . Joint pain   . Knee pain, right 03/10/2017  . Plantar fasciitis   . Shortness of breath   . Sinusitis   . Vitamin D deficiency 11/22/2016  . Vitamin D deficiency     PAST SURGICAL HISTORY: Past Surgical History:   Procedure Laterality Date  . COLONOSCOPY  02-2009   with Henrene Pastor normal  . ESOPHAGOGASTRODUODENOSCOPY  2012  . GANGLION CYST EXCISION Right    right- wrist   . NASAL SEPTUM SURGERY    . NASAL SINUS SURGERY    . TMJ ARTHROSCOPY     left    SOCIAL HISTORY: Social History   Tobacco Use  . Smoking status: Never Smoker  . Smokeless tobacco: Never Used  Substance Use Topics  . Alcohol use: Yes    Alcohol/week: 0.0 standard drinks    Comment: rare  . Drug use: No    FAMILY HISTORY: Family History  Problem Relation Age of Onset  . Hypertension Father   . Diabetes Father   . Hyperlipidemia Father   . Breast cancer Mother 14  . Cancer Mother  breast  . Thyroid disease Mother   . Sleep apnea Mother   . Colon cancer Paternal Grandfather        died at age 53  . Cancer Paternal Grandfather        GI CA  . Obesity Sister   . Diabetes Sister   . Hypertension Sister   . Heart disease Maternal Grandmother        MI  . Cancer Maternal Grandfather        lung, smoker  . Diabetes Maternal Grandfather   . Diabetes Paternal Grandmother   . Heart disease Paternal Grandmother   . Pancreatitis Sister   . Esophageal cancer Neg Hx   . Stomach cancer Neg Hx   . Stroke Neg Hx   . Colon polyps Neg Hx   . Rectal cancer Neg Hx   . Pancreatic cancer Neg Hx     ROS: Review of Systems  Gastrointestinal: Negative for nausea and vomiting.  Musculoskeletal:       Negative for muscle weakness.  Endo/Heme/Allergies:       Positive for polyphagia.    PHYSICAL EXAM: Pt in no acute distress  RECENT LABS AND TESTS: BMET    Component Value Date/Time   NA 140 12/30/2018 1004   K 4.4 12/30/2018 1004   CL 103 12/30/2018 1004   CO2 21 12/30/2018 1004   GLUCOSE 82 12/30/2018 1004   GLUCOSE 85 11/22/2016 1521   GLUCOSE 86 11/13/2009   BUN 18 12/30/2018 1004   CREATININE 0.58 12/30/2018 1004   CREATININE 0.67 11/22/2016 1521   CALCIUM 9.0 12/30/2018 1004   GFRNONAA 106  12/30/2018 1004   GFRAA 122 12/30/2018 1004   Lab Results  Component Value Date   HGBA1C 5.4 12/30/2018   HGBA1C 5.4 09/21/2018   HGBA1C 5.4 04/30/2018   HGBA1C 5.2 10/06/2017   HGBA1C 5.1 04/06/2014   Lab Results  Component Value Date   INSULIN 9.3 12/30/2018   INSULIN 11.1 09/21/2018   INSULIN 8.7 04/30/2018   INSULIN 8.8 10/06/2017   CBC    Component Value Date/Time   WBC 9.6 10/06/2017 0850   WBC 9.8 11/22/2016 1521   RBC 4.71 10/06/2017 0850   RBC 4.41 11/22/2016 1521   HGB 13.8 10/06/2017 0850   HCT 42.4 10/06/2017 0850   PLT 328 11/22/2016 1521   PLT 381 11/13/2009   MCV 90 10/06/2017 0850   MCH 29.3 10/06/2017 0850   MCH 29.7 11/22/2016 1521   MCHC 32.5 10/06/2017 0850   MCHC 32.2 11/22/2016 1521   RDW 13.5 10/06/2017 0850   LYMPHSABS 1.8 10/06/2017 0850   MONOABS 0.5 05/26/2015 0931   EOSABS 0.1 10/06/2017 0850   BASOSABS 0.1 10/06/2017 0850   Iron/TIBC/Ferritin/ %Sat    Component Value Date/Time   FERRITIN 50 11/09/2008   Lipid Panel     Component Value Date/Time   CHOL 162 12/30/2018 1004   TRIG 109 12/30/2018 1004   TRIG 138 11/13/2009 1337   HDL 51 12/30/2018 1004   CHOLHDL 3.3 04/30/2018 1042   CHOLHDL 3.9 11/22/2016 1521   VLDL 32 (H) 11/22/2016 1521   LDLCALC 89 12/30/2018 1004   Hepatic Function Panel     Component Value Date/Time   PROT 6.7 12/30/2018 1004   ALBUMIN 4.2 12/30/2018 1004   AST 14 12/30/2018 1004   ALT 20 12/30/2018 1004   ALKPHOS 144 (H) 12/30/2018 1004   BILITOT 0.2 12/30/2018 1004   BILIDIR 0.1 05/26/2015 0931  Component Value Date/Time   TSH 1.690 10/06/2017 0850   TSH 1.30 11/22/2016 1521   TSH 1.67 05/26/2015 0931   Results for LILLIANNA, SABEL (MRN [id number]) as of 06/03/2019 14:44  Ref. Range 12/30/2018 10:04  Vitamin D, 25-Hydroxy Latest Ref Range: 30.0 - 100.0 ng/mL 57.9    I, Marcille Blanco, CMA, am acting as Location manager for [employer], FNP-C  I have reviewed the above documentation for  accuracy and completeness, and I agree with the above.  - Katherine Syme, FNP-C.

## 2019-06-07 ENCOUNTER — Encounter (INDEPENDENT_AMBULATORY_CARE_PROVIDER_SITE_OTHER): Payer: Self-pay | Admitting: Family Medicine

## 2019-06-15 ENCOUNTER — Encounter (INDEPENDENT_AMBULATORY_CARE_PROVIDER_SITE_OTHER): Payer: Self-pay | Admitting: Family Medicine

## 2019-06-15 ENCOUNTER — Other Ambulatory Visit: Payer: Self-pay

## 2019-06-15 ENCOUNTER — Telehealth (INDEPENDENT_AMBULATORY_CARE_PROVIDER_SITE_OTHER): Payer: 59 | Admitting: Family Medicine

## 2019-06-15 ENCOUNTER — Other Ambulatory Visit (INDEPENDENT_AMBULATORY_CARE_PROVIDER_SITE_OTHER): Payer: Self-pay

## 2019-06-15 DIAGNOSIS — E88819 Insulin resistance, unspecified: Secondary | ICD-10-CM

## 2019-06-15 DIAGNOSIS — E559 Vitamin D deficiency, unspecified: Secondary | ICD-10-CM

## 2019-06-15 DIAGNOSIS — Z6835 Body mass index (BMI) 35.0-35.9, adult: Secondary | ICD-10-CM | POA: Diagnosis not present

## 2019-06-15 DIAGNOSIS — E8881 Metabolic syndrome: Secondary | ICD-10-CM

## 2019-06-15 DIAGNOSIS — R5383 Other fatigue: Secondary | ICD-10-CM

## 2019-06-15 MED ORDER — METFORMIN HCL 500 MG PO TABS: 500.0000 mg | ORAL_TABLET | Freq: Two times a day (BID) | ORAL | 0 refills | Status: DC

## 2019-06-15 MED FILL — metFORMIN HCL 500 MG TABS: 500 | 30 days supply | Qty: 60 | Fill #0

## 2019-06-15 NOTE — Progress Notes (Signed)
Office: [phone]  /  Fax: [phone] TeleHealth Visit:  JELISA [place] has verbally consented to this TeleHealth visit today. The patient is located at home, the provider is located at the [employer] [facility] office. The participants in this visit include the listed provider and patient. Chantalle was unable to use realtime audiovisual technology today and the telehealth visit was conducted via telephone (20 minutes).   HPI:   Chief Complaint: OBESITY Hailey Noble is here to discuss her progress with her obesity treatment plan. She is on the keep a food journal with 1100-1200 calories and 85 grams of protein daily and is following her eating plan approximately 90 % of the time. She states she is walking for 45-60 minutes 4 times per week. Dayanne's weight is maintained at 213 lbs. She is journaling fairly consistently, and keeping calories <1250.  We were unable to weigh the patient today for this TeleHealth visit. She feels as if she has maintained her weight since her last visit. She has lost 0 lbs since starting treatment with [name].  Insulin Resistance Cheyanne has a diagnosis of insulin resistance based on her elevated fasting insulin level >5. Although Jerolene's blood glucose readings are still under good control, insulin resistance puts her at greater risk of metabolic syndrome and diabetes. We increased metformin to BID last visit. She had some loose stools which have improved. Her hunger is well controlled. She has cravings in the afternoons for sweets. She continues to work on diet and exercise to decrease risk of diabetes. Lab Results  Component Value Date   HGBA1C 5.4 06/16/2019    Vitamin D Deficiency Debby has a diagnosis of vitamin D deficiency. She has been on prescription Vit D every other week, and OTC Vit D 4,000 IU daily. Last Vit D level at goal. She denies nausea, vomiting or muscle weakness.  Fatigue Pearlina feels her energy is lower than it should be. She denies hot or  cold intolerance, or hair thinning. She notes hypothyroidism runs in her family.  ASSESSMENT AND PLAN:  Insulin resistance - Plan: Hemoglobin A1c, Insulin, Free and Total, metFORMIN (GLUCOPHAGE) 500 MG tablet  Vitamin D deficiency - Plan: VITAMIN D 25 Hydroxy (Vit-D Deficiency, Fractures)  Fatigue, unspecified type - Plan: Thyroid Panel With TSH  PLAN:  Insulin Resistance Avonlea will continue to work on weight loss, exercise, and decreasing simple carbohydrates in her diet to help decrease the risk of diabetes. We dicussed metformin including benefits and risks. She was informed that eating too many simple carbohydrates or too many calories at one sitting increases the likelihood of GI side effects. Joanann agrees to continue taking metformin 500 mg BID with meals #60 and we will refill for 1 month. Insulin and A1c was ordered today. Laporcha agrees to follow up with our clinic in 2 weeks with Abby Potash, PA-C as directed to monitor her progress.  Vitamin D Deficiency Anderson was informed that low vitamin D levels contributes to fatigue and are associated with obesity, breast, and colon cancer. Lafreda agrees to discontinue prescription Vit D pending labs, and she agrees to continue taking OTC Vit D. She will follow up for routine testing of vitamin D, at least 2-3 times per year. She was informed of the risk of over-replacement of vitamin D and agrees to not increase her dose unless she discusses this with [name] first. Vit D level was ordered today. Toinette agrees to follow up with our clinic in 2 weeks with Abby Potash, PA-C.  Fatigue  Aryianna was informed that her fatigue may be related to obesity, depression or many other causes. Labs will be ordered, and in the meanwhile Leauna has agreed to work on diet, exercise and weight loss to help with fatigue. Proper sleep hygiene was discussed including the need for 7-8 hours of quality sleep each night. Thyroid panel was ordered today. Camora agrees to  follow up with our clinic in 2 weeks with Abby Potash, PA-C.  Obesity Aanya is currently in the action stage of change. As such, her goal is to continue with weight loss efforts She has agreed to keep a food journal with 1100-1200 calories and 85 grams of protein daily or follow the Category 2 plan Bijal is to keep her calories closer to 1100 daily Aimee will continue current exercise regimen for weight loss and overall health benefits. We discussed the following Behavioral Modification Strategies today: planning for success Zaina will go to [facility] to have her labs done.  Paulena has agreed to follow up with our clinic in 2 weeks with Abby Potash, PA-C. She was informed of the importance of frequent follow up visits to maximize her success with intensive lifestyle modifications for her multiple health conditions.  ALLERGIES: Allergies  Allergen Reactions  . Coconut Fatty Acids Hives  . Codeine   . Hydrocod Polst-Cpm Polst Er     REACTION: rash on face and head, itching  . Mango Flavor Hives  . [name] Other (See Comments)  . Oxycodone-Acetaminophen     REACTION: rash on face and head, itching  . Tussin Cough [Dextromethorphan Hbr]     Broken out in hives  . Ultram [Tramadol] Other (See Comments)    Dizzy and low blood pressure.     MEDICATIONS: Current Outpatient Medications on File Prior to Visit  Medication Sig Dispense Refill  . acetaminophen (TYLENOL) 650 MG CR tablet Take 650 mg by mouth every 8 (eight) hours as needed. pain    . Calcium Carbonate-Vit D-Min (CALCIUM 1200 PO) Take by mouth daily.      . cetirizine (ZYRTEC) 10 MG tablet Take 10 mg by mouth daily.    . Cholecalciferol (VITAMIN D-1000 MAX ST PO) Take 1 tablet by mouth.    . escitalopram (LEXAPRO) 10 MG tablet TAKE 1 TABLET BY MOUTH ONCE DAILY 90 tablet 3  . KRILL OIL 1000 MG CAPS Take 1 capsule by mouth daily.    . mometasone (NASONEX) 50 MCG/ACT nasal spray Place 2 sprays into the nose daily. 17  g 6  . Polyethyl Glycol-Propyl Glycol (SYSTANE FREE OP) Apply 1 drop to eye daily.    Marland Kitchen triamcinolone ointment (KENALOG) 0.5 % Apply 1 application topically 2 (two) times daily. 30 g 0  . Vitamin D, Ergocalciferol, (DRISDOL) 1.25 MG (50000 UT) CAPS capsule Take 1 capsule (50,000 Units total) by mouth every 14 (fourteen) days. 2 capsule 0  . predniSONE (STERAPRED UNI-PAK 21 TAB) 10 MG (21) TBPK tablet Use as directed (Patient not taking: Reported on 06/15/2019) 21 tablet 0   No current facility-administered medications on file prior to visit.     PAST MEDICAL HISTORY: Past Medical History:  Diagnosis Date  . Allergic state 12/08/2016  . Allergy   . Anxiety   . Back pain   . GERD (gastroesophageal reflux disease)   . Hyperlipidemia 06/09/2017  . Joint pain   . Knee pain, right 03/10/2017  . Plantar fasciitis   . Shortness of breath   . Sinusitis   . Vitamin D deficiency 11/22/2016  .  Vitamin D deficiency     PAST SURGICAL HISTORY: Past Surgical History:  Procedure Laterality Date  . COLONOSCOPY  02-2009   with Henrene Pastor normal  . ESOPHAGOGASTRODUODENOSCOPY  2012  . GANGLION CYST EXCISION Right    right- wrist   . NASAL SEPTUM SURGERY    . NASAL SINUS SURGERY    . TMJ ARTHROSCOPY     left    SOCIAL HISTORY: Social History   Tobacco Use  . Smoking status: Never Smoker  . Smokeless tobacco: Never Used  Substance Use Topics  . Alcohol use: Yes    Alcohol/week: 0.0 standard drinks    Comment: rare  . Drug use: No    FAMILY HISTORY: Family History  Problem Relation Age of Onset  . Hypertension Father   . Diabetes Father   . Hyperlipidemia Father   . Breast cancer Mother 80  . Cancer Mother        breast  . Thyroid disease Mother   . Sleep apnea Mother   . Colon cancer Paternal Grandfather        died at age 1  . Cancer Paternal Grandfather        GI CA  . Obesity Sister   . Diabetes Sister   . Hypertension Sister   . Heart disease Maternal Grandmother         MI  . Cancer Maternal Grandfather        lung, smoker  . Diabetes Maternal Grandfather   . Diabetes Paternal Grandmother   . Heart disease Paternal Grandmother   . Pancreatitis Sister   . Esophageal cancer Neg Hx   . Stomach cancer Neg Hx   . Stroke Neg Hx   . Colon polyps Neg Hx   . Rectal cancer Neg Hx   . Pancreatic cancer Neg Hx     ROS: Review of Systems  Constitutional: Positive for malaise/fatigue. Negative for weight loss.  Gastrointestinal: Negative for nausea and vomiting.  Musculoskeletal:       Negative muscle weakness  Skin:       Negative hair thinning    PHYSICAL EXAM: Pt in no acute distress  RECENT LABS AND TESTS: BMET    Component Value Date/Time   NA 140 12/30/2018 1004   K 4.4 12/30/2018 1004   CL 103 12/30/2018 1004   CO2 21 12/30/2018 1004   GLUCOSE 82 12/30/2018 1004   GLUCOSE 85 11/22/2016 1521   GLUCOSE 86 11/13/2009   BUN 18 12/30/2018 1004   CREATININE 0.58 12/30/2018 1004   CREATININE 0.67 11/22/2016 1521   CALCIUM 9.0 12/30/2018 1004   GFRNONAA 106 12/30/2018 1004   GFRAA 122 12/30/2018 1004   Lab Results  Component Value Date   HGBA1C 5.4 12/30/2018   HGBA1C 5.4 09/21/2018   HGBA1C 5.4 04/30/2018   HGBA1C 5.2 10/06/2017   HGBA1C 5.1 04/06/2014   Lab Results  Component Value Date   INSULIN 9.3 12/30/2018   INSULIN 11.1 09/21/2018   INSULIN 8.7 04/30/2018   INSULIN 8.8 10/06/2017   CBC    Component Value Date/Time   WBC 9.6 10/06/2017 0850   WBC 9.8 11/22/2016 1521   RBC 4.71 10/06/2017 0850   RBC 4.41 11/22/2016 1521   HGB 13.8 10/06/2017 0850   HCT 42.4 10/06/2017 0850   PLT 328 11/22/2016 1521   PLT 381 11/13/2009   MCV 90 10/06/2017 0850   MCH 29.3 10/06/2017 0850   MCH 29.7 11/22/2016 1521   MCHC 32.5 10/06/2017 0850  MCHC 32.2 11/22/2016 1521   RDW 13.5 10/06/2017 0850   LYMPHSABS 1.8 10/06/2017 0850   MONOABS 0.5 05/26/2015 0931   EOSABS 0.1 10/06/2017 0850   BASOSABS 0.1 10/06/2017 0850    Iron/TIBC/Ferritin/ %Sat    Component Value Date/Time   FERRITIN 50 11/09/2008   Lipid Panel     Component Value Date/Time   CHOL 162 12/30/2018 1004   TRIG 109 12/30/2018 1004   TRIG 138 11/13/2009 1337   HDL 51 12/30/2018 1004   CHOLHDL 3.3 04/30/2018 1042   CHOLHDL 3.9 11/22/2016 1521   VLDL 32 (H) 11/22/2016 1521   LDLCALC 89 12/30/2018 1004   Hepatic Function Panel     Component Value Date/Time   PROT 6.7 12/30/2018 1004   ALBUMIN 4.2 12/30/2018 1004   AST 14 12/30/2018 1004   ALT 20 12/30/2018 1004   ALKPHOS 144 (H) 12/30/2018 1004   BILITOT 0.2 12/30/2018 1004   BILIDIR 0.1 05/26/2015 0931      Component Value Date/Time   TSH 1.690 10/06/2017 0850   TSH 1.30 11/22/2016 1521   TSH 1.67 05/26/2015 0931      I, Trixie Dredge, am acting as transcriptionist for [employer], FNP-C  I have reviewed the above documentation for accuracy and completeness, and I agree with the above.  -  , FNP-C.

## 2019-06-16 DIAGNOSIS — E8881 Metabolic syndrome: Secondary | ICD-10-CM | POA: Diagnosis not present

## 2019-06-16 DIAGNOSIS — R5383 Other fatigue: Secondary | ICD-10-CM | POA: Diagnosis not present

## 2019-06-16 DIAGNOSIS — E559 Vitamin D deficiency, unspecified: Secondary | ICD-10-CM | POA: Diagnosis not present

## 2019-06-21 LAB — VITAMIN D 25 HYDROXY (VIT D DEFICIENCY, FRACTURES): Vit D, 25-Hydroxy: 64.7 ng/mL (ref 30.0–100.0)

## 2019-06-21 LAB — THYROID PANEL WITH TSH
Free Thyroxine Index: 1.5 (ref 1.2–4.9)
T3 Uptake Ratio: 24 % (ref 24–39)
T4, Total: 6.1 ug/dL (ref 4.5–12.0)
TSH: 2.36 u[IU]/mL (ref 0.450–4.500)

## 2019-06-21 LAB — INSULIN, FREE AND TOTAL
Free Insulin: 10 uU/mL
Total Insulin: 10 uU/mL

## 2019-06-21 LAB — HEMOGLOBIN A1C
Est. average glucose Bld gHb Est-mCnc: 108 mg/dL
Hgb A1c MFr Bld: 5.4 % (ref 4.8–5.6)

## 2019-06-29 ENCOUNTER — Encounter (INDEPENDENT_AMBULATORY_CARE_PROVIDER_SITE_OTHER): Payer: Self-pay

## 2019-06-30 ENCOUNTER — Other Ambulatory Visit: Payer: Self-pay

## 2019-06-30 ENCOUNTER — Encounter (INDEPENDENT_AMBULATORY_CARE_PROVIDER_SITE_OTHER): Payer: Self-pay | Admitting: Physician Assistant

## 2019-06-30 ENCOUNTER — Ambulatory Visit (INDEPENDENT_AMBULATORY_CARE_PROVIDER_SITE_OTHER): Payer: 59 | Admitting: Physician Assistant

## 2019-06-30 VITALS — BP 123/82 | HR 76 | Temp 98.0°F | Ht 65.0 in | Wt 210.0 lb

## 2019-06-30 DIAGNOSIS — Z6835 Body mass index (BMI) 35.0-35.9, adult: Secondary | ICD-10-CM

## 2019-06-30 DIAGNOSIS — E8881 Metabolic syndrome: Secondary | ICD-10-CM | POA: Diagnosis not present

## 2019-06-30 DIAGNOSIS — E559 Vitamin D deficiency, unspecified: Secondary | ICD-10-CM | POA: Diagnosis not present

## 2019-06-30 DIAGNOSIS — Z9189 Other specified personal risk factors, not elsewhere classified: Secondary | ICD-10-CM

## 2019-07-01 NOTE — Progress Notes (Signed)
Office: [phone]  /  Fax: [phone]   HPI:   Chief Complaint: OBESITY Hailey Noble is here to discuss her progress with her obesity treatment plan. She is on the Category 2 plan and is following her eating plan approximately 80% of the time. She states she is walking 60 minutes 3 times per week. Hailey Noble reports that she is following the plan closely. She cannot tolerate 2 metformins daily as she states it gives her GI upset and is back to taking it once daily.  Her weight is 210 lb (95.3 kg) today and has had a weight loss of 2 pounds over a period of 4 months since her last in-office visit. She has lost 2 lbs since starting treatment with [name].  Vitamin D deficiency Hailey Noble has a diagnosis of Vitamin D deficiency. She is currently taking prescription Vit D and denies nausea, vomiting or muscle weakness.  At risk for osteopenia and osteoporosis Hailey Noble is at higher risk of osteopenia and osteoporosis due to Vitamin D deficiency.   Insulin Resistance Hailey Noble has a diagnosis of insulin resistance based on her elevated fasting insulin level >5. Although Hailey Noble's blood glucose readings are still under good control, insulin resistance puts her at greater risk of metabolic syndrome and diabetes. She is taking metformin currently and states she cannot tolerate 2 metformins a day. She continues to work on diet and exercise to decrease risk of diabetes. No nausea, vomiting, diarrhea, or polyphagia.  ASSESSMENT AND PLAN:  Vitamin D deficiency  Insulin resistance  At risk for osteoporosis  Class 2 severe obesity with serious comorbidity and body mass index (BMI) of 35.0 to 35.9 in adult, unspecified obesity type ([place])  PLAN:  Vitamin D Deficiency Hailey Noble was informed that low Vitamin D levels contributes to fatigue and are associated with obesity, breast, and colon cancer. She will discontinue Vitamin D 50,000 units and continue OTC 4,000 units daily. She will follow-up for routine testing of  Vitamin D, at least 2-3 times per year. She was informed of the risk of over-replacement of Vitamin D and agrees to not increase her dose unless she discusses this with [name] first. Hailey Noble agrees to follow-up with our clinic in 2 weeks.  At risk for osteopenia and osteoporosis Hailey Noble was given extended  (15 minutes) osteoporosis prevention counseling today. Hailey Noble is at risk for osteopenia and osteoporsis due to her Vitamin D deficiency. She was encouraged to take her Vitamin D and follow her higher calcium diet and increase strengthening exercise to help strengthen her bones and decrease her risk of osteopenia and osteoporosis.  Insulin Resistance Hailey Noble will continue to work on weight loss, exercise, and decreasing simple carbohydrates in her diet to help decrease the risk of diabetes. We dicussed metformin including benefits and risks. She was informed that eating too many simple carbohydrates or too many calories at one sitting increases the likelihood of GI side effects. Hailey Noble will take one metformin at lunch (no prescription needed) and follow-up with [name] as directed to monitor her progress.  Obesity Hailey Noble is currently in the action stage of change. As such, her goal is to continue with weight loss efforts. She has agreed to follow the Category 2 plan. We will check IC at her next visit. Hailey Noble has been instructed to work up to a goal of 150 minutes of combined cardio and strengthening exercise per week for weight loss and overall health benefits. We discussed the following Behavioral Modification Strategies today: work on meal planning, easy cooking plans, and keeping  healthy foods in the home.  Hailey Noble has agreed to follow-up with our clinic in 2 weeks. She was informed of the importance of frequent follow-up visits to maximize her success with intensive lifestyle modifications for her multiple health conditions.  ALLERGIES: Allergies  Allergen Reactions   Coconut Fatty Acids Hives    Codeine    Hydrocod Polst-Cpm Polst Er     REACTION: rash on face and head, itching   Mango Flavor Hives   [name] Other (See Comments)   Oxycodone-Acetaminophen     REACTION: rash on face and head, itching   Tussin Cough [Dextromethorphan Hbr]     Broken out in hives   Ultram [Tramadol] Other (See Comments)    Dizzy and low blood pressure.     MEDICATIONS: Current Outpatient Medications on File Prior to Visit  Medication Sig Dispense Refill   acetaminophen (TYLENOL) 650 MG CR tablet Take 650 mg by mouth every 8 (eight) hours as needed. pain     Calcium Carbonate-Vit D-Min (CALCIUM 1200 PO) Take by mouth daily.       cetirizine (ZYRTEC) 10 MG tablet Take 10 mg by mouth daily.     Cholecalciferol (VITAMIN D-1000 MAX ST PO) Take 1 tablet by mouth.     escitalopram (LEXAPRO) 10 MG tablet TAKE 1 TABLET BY MOUTH ONCE DAILY 90 tablet 3   KRILL OIL 1000 MG CAPS Take 1 capsule by mouth daily.     metFORMIN (GLUCOPHAGE) 500 MG tablet Take 1 tablet (500 mg total) by mouth 2 (two) times daily with a meal. 60 tablet 0   mometasone (NASONEX) 50 MCG/ACT nasal spray Place 2 sprays into the nose daily. 17 g 6   Polyethyl Glycol-Propyl Glycol (SYSTANE FREE OP) Apply 1 drop to eye daily.     predniSONE (STERAPRED UNI-PAK 21 TAB) 10 MG (21) TBPK tablet Use as directed (Patient not taking: Reported on 06/15/2019) 21 tablet 0   triamcinolone ointment (KENALOG) 0.5 % Apply 1 application topically 2 (two) times daily. 30 g 0   No current facility-administered medications on file prior to visit.     PAST MEDICAL HISTORY: Past Medical History:  Diagnosis Date   Allergic state 12/08/2016   Allergy    Anxiety    Back pain    GERD (gastroesophageal reflux disease)    Hyperlipidemia 06/09/2017   Joint pain    Knee pain, right 03/10/2017   Plantar fasciitis    Shortness of breath    Sinusitis    Vitamin D deficiency 11/22/2016   Vitamin D deficiency     PAST SURGICAL  HISTORY: Past Surgical History:  Procedure Laterality Date   COLONOSCOPY  02-2009   with Henrene Pastor normal   ESOPHAGOGASTRODUODENOSCOPY  2012   GANGLION CYST EXCISION Right    right- wrist    NASAL SEPTUM SURGERY     NASAL SINUS SURGERY     TMJ ARTHROSCOPY     left    SOCIAL HISTORY: Social History   Tobacco Use   Smoking status: Never Smoker   Smokeless tobacco: Never Used  Substance Use Topics   Alcohol use: Yes    Alcohol/week: 0.0 standard drinks    Comment: rare   Drug use: No    FAMILY HISTORY: Family History  Problem Relation Age of Onset   Hypertension Father    Diabetes Father    Hyperlipidemia Father    Breast cancer Mother 63   Cancer Mother        breast   Thyroid  disease Mother    Sleep apnea Mother    Colon cancer Paternal Grandfather        died at age 82   Cancer Paternal Grandfather        GI CA   Obesity Sister    Diabetes Sister    Hypertension Sister    Heart disease Maternal Grandmother        MI   Cancer Maternal Grandfather        lung, smoker   Diabetes Maternal Grandfather    Diabetes Paternal Grandmother    Heart disease Paternal Grandmother    Pancreatitis Sister    Esophageal cancer Neg Hx    Stomach cancer Neg Hx    Stroke Neg Hx    Colon polyps Neg Hx    Rectal cancer Neg Hx    Pancreatic cancer Neg Hx    ROS: Review of Systems  Gastrointestinal: Negative for diarrhea, nausea and vomiting.  Musculoskeletal:       Negative for muscle weakness.  Endo/Heme/Allergies:       Negative for polyphagia.   PHYSICAL EXAM: Blood pressure 123/82, pulse 76, temperature 98 F (36.7 C), temperature source Oral, height 5\' 5"  (1.651 m), weight 210 lb (95.3 kg), SpO2 95 %. Body mass index is 34.95 kg/m. Physical Exam Vitals signs reviewed.  Constitutional:      Appearance: Normal appearance. She is obese.  Cardiovascular:     Rate and Rhythm: Normal rate.     Pulses: Normal pulses.  Pulmonary:      Effort: Pulmonary effort is normal.     Breath sounds: Normal breath sounds.  Musculoskeletal: Normal range of motion.  Skin:    General: Skin is warm and dry.  Neurological:     Mental Status: She is alert and oriented to person, place, and time.  Psychiatric:        Behavior: Behavior normal.   RECENT LABS AND TESTS: BMET    Component Value Date/Time   NA 140 12/30/2018 1004   K 4.4 12/30/2018 1004   CL 103 12/30/2018 1004   CO2 21 12/30/2018 1004   GLUCOSE 82 12/30/2018 1004   GLUCOSE 85 11/22/2016 1521   GLUCOSE 86 11/13/2009   BUN 18 12/30/2018 1004   CREATININE 0.58 12/30/2018 1004   CREATININE 0.67 11/22/2016 1521   CALCIUM 9.0 12/30/2018 1004   GFRNONAA 106 12/30/2018 1004   GFRAA 122 12/30/2018 1004   Lab Results  Component Value Date   HGBA1C 5.4 06/16/2019   HGBA1C 5.4 12/30/2018   HGBA1C 5.4 09/21/2018   HGBA1C 5.4 04/30/2018   HGBA1C 5.2 10/06/2017   Lab Results  Component Value Date   INSULIN 9.3 12/30/2018   INSULIN 11.1 09/21/2018   INSULIN 8.7 04/30/2018   INSULIN 8.8 10/06/2017   CBC    Component Value Date/Time   WBC 9.6 10/06/2017 0850   WBC 9.8 11/22/2016 1521   RBC 4.71 10/06/2017 0850   RBC 4.41 11/22/2016 1521   HGB 13.8 10/06/2017 0850   HCT 42.4 10/06/2017 0850   PLT 328 11/22/2016 1521   PLT 381 11/13/2009   MCV 90 10/06/2017 0850   MCH 29.3 10/06/2017 0850   MCH 29.7 11/22/2016 1521   MCHC 32.5 10/06/2017 0850   MCHC 32.2 11/22/2016 1521   RDW 13.5 10/06/2017 0850   LYMPHSABS 1.8 10/06/2017 0850   MONOABS 0.5 05/26/2015 0931   EOSABS 0.1 10/06/2017 0850   BASOSABS 0.1 10/06/2017 0850   Iron/TIBC/Ferritin/ %Sat    Component  Value Date/Time   FERRITIN 50 11/09/2008   Lipid Panel     Component Value Date/Time   CHOL 162 12/30/2018 1004   TRIG 109 12/30/2018 1004   TRIG 138 11/13/2009 1337   HDL 51 12/30/2018 1004   CHOLHDL 3.3 04/30/2018 1042   CHOLHDL 3.9 11/22/2016 1521   VLDL 32 (H) 11/22/2016 1521   LDLCALC  89 12/30/2018 1004   Hepatic Function Panel     Component Value Date/Time   PROT 6.7 12/30/2018 1004   ALBUMIN 4.2 12/30/2018 1004   AST 14 12/30/2018 1004   ALT 20 12/30/2018 1004   ALKPHOS 144 (H) 12/30/2018 1004   BILITOT 0.2 12/30/2018 1004   BILIDIR 0.1 05/26/2015 0931      Component Value Date/Time   TSH 2.360 06/16/2019 0807   TSH 1.690 10/06/2017 0850   TSH 1.30 11/22/2016 1521   Results for CAMPCarnita, Golob I (MRN [id number]) as of 07/01/2019 07:41  Ref. Range 06/16/2019 08:07  Vitamin D, 25-Hydroxy Latest Ref Range: 30.0 - 100.0 ng/mL 64.7   OBESITY BEHAVIORAL INTERVENTION VISIT  Today's visit was #34   Starting weight: 212 lbs Starting date: 10/06/2017 Today's weight: 210 lbs  Today's date: 06/30/2019 Total lbs lost to date: 2   06/30/2019  Height 5\' 5"  (1.651 m)  Weight 210 lb (95.3 kg)  BMI (Calculated) 34.95  BLOOD PRESSURE - SYSTOLIC 233  BLOOD PRESSURE - DIASTOLIC 82   Body Fat % 43.5 %  Total Body Water (lbs) 84.8 lbs   ASK: We discussed the diagnosis of obesity with Hebert Soho today and Gwyn agreed to give [name] permission to discuss obesity behavioral modification therapy today.  ASSESS: Shemia has the diagnosis of obesity and her BMI today is 35.1. Afsana is in the action stage of change.   ADVISE: Marcha was educated on the multiple health risks of obesity as well as the benefit of weight loss to improve her health. She was advised of the need for long term treatment and the importance of lifestyle modifications to improve her current health and to decrease her risk of future health problems.  AGREE: Multiple dietary modification options and treatment options were discussed and  Arbadella agreed to follow the recommendations documented in the above note.  ARRANGE: Caree was educated on the importance of frequent visits to treat obesity as outlined per CMS and USPSTF guidelines and agreed to schedule her next follow up appointment today.  Migdalia Hailey Noble, am acting as transcriptionist for Abby Potash, PA-C I, Abby Potash, PA-C have reviewed above note and agree with its content

## 2019-07-13 DIAGNOSIS — N840 Polyp of corpus uteri: Secondary | ICD-10-CM | POA: Diagnosis not present

## 2019-07-13 DIAGNOSIS — N950 Postmenopausal bleeding: Secondary | ICD-10-CM | POA: Diagnosis not present

## 2019-07-13 DIAGNOSIS — R102 Pelvic and perineal pain: Secondary | ICD-10-CM | POA: Diagnosis not present

## 2019-07-13 DIAGNOSIS — D250 Submucous leiomyoma of uterus: Secondary | ICD-10-CM | POA: Diagnosis not present

## 2019-07-15 ENCOUNTER — Other Ambulatory Visit: Payer: Self-pay

## 2019-07-15 ENCOUNTER — Ambulatory Visit (INDEPENDENT_AMBULATORY_CARE_PROVIDER_SITE_OTHER): Payer: 59 | Admitting: Physician Assistant

## 2019-07-15 VITALS — BP 120/79 | HR 85 | Temp 98.5°F | Ht 65.0 in | Wt 206.0 lb

## 2019-07-15 DIAGNOSIS — R0602 Shortness of breath: Secondary | ICD-10-CM | POA: Diagnosis not present

## 2019-07-15 DIAGNOSIS — E669 Obesity, unspecified: Secondary | ICD-10-CM

## 2019-07-15 DIAGNOSIS — Z9189 Other specified personal risk factors, not elsewhere classified: Secondary | ICD-10-CM | POA: Diagnosis not present

## 2019-07-15 DIAGNOSIS — E559 Vitamin D deficiency, unspecified: Secondary | ICD-10-CM

## 2019-07-15 DIAGNOSIS — Z6834 Body mass index (BMI) 34.0-34.9, adult: Secondary | ICD-10-CM | POA: Diagnosis not present

## 2019-07-15 DIAGNOSIS — E8881 Metabolic syndrome: Secondary | ICD-10-CM

## 2019-07-15 NOTE — Progress Notes (Signed)
**Note Hailey Noble-Identified via Obfuscation** Office: [phone]  /  Fax: [phone]   HPI:   Chief Complaint: OBESITY Hailey Noble is here to discuss her progress with her obesity treatment plan. She is on the Category 2 plan and is following her eating plan approximately 90% of the time. She states she is swimming 45 minutes 7 times per week. Hailey Noble reports that she is following the plan well. She has started swimming 5-6 days a week and is enjoying it. Her weight is 206 lb (93.4 kg) today and has had a weight loss of 4 pounds over a period of 2 weeks since her last visit. She has lost 6 lbs since starting treatment with [name].  Vitamin D deficiency Hailey Noble has a diagnosis of Vitamin D deficiency. She is currently taking OTC Vit D and denies nausea, vomiting or muscle weakness.  Insulin Resistance Hailey Noble has a diagnosis of insulin resistance based on her elevated fasting insulin level >5. Although Hailey Noble's blood glucose readings are still under good control, insulin resistance puts her at greater risk of metabolic syndrome and diabetes. She is taking metformin currently and continues to work on diet and exercise to decrease risk of diabetes. No nausea, vomiting, diarrhea, or polyphagia.  At risk for diabetes Hailey Noble is at higher than averagerisk for developing diabetes due to her obesity. She currently denies polyuria or polydipsia.  Shortness of Breath with Exertion Hailey Noble notes increasing shortness of breath with exercising and seems to be worsening over time with weight gain. She notes getting out of breath sooner with activity than she used to. She denies dizziness or lightheadedness. No chest pain.  ASSESSMENT AND PLAN:  Vitamin D deficiency  Insulin resistance  At risk for diabetes mellitus  Shortness of breath on exertion  Class 1 obesity with serious comorbidity and body mass index (BMI) of 34.0 to 34.9 in adult, unspecified obesity type  PLAN:  Vitamin D Deficiency Hailey Noble was informed that low Vitamin D levels  contributes to fatigue and are associated with obesity, breast, and colon cancer. She agrees to continue taking OTC Vit D and will follow-up for routine testing of Vitamin D, at least 2-3 times per year. She was informed of the risk of over-replacement of Vitamin D and agrees to not increase her dose unless she discusses this with [name] first. Hailey Noble agrees to follow-up with our clinic in 2 weeks.  Insulin Resistance Hailey Noble will continue to work on weight loss, exercise, and decreasing simple carbohydrates in her diet to help decrease the risk of diabetes. We dicussed metformin including benefits and risks. She was informed that eating too many simple carbohydrates or too many calories at one sitting increases the likelihood of GI side effects. Hailey Noble will continue metformin and weight loss. She will follow-up with [name] as directed to monitor her progress.  Diabetes risk counseling Hailey Noble was given extended (15 minutes) diabetes prevention counseling today. She is 53 y.o. female and has risk factors for diabetes including obesity. We discussed intensive lifestyle modifications today with an emphasis on weight loss as well as increasing exercise and decreasing simple carbohydrates in her diet.  Shortness of Breath with Exertion Hailey Noble's shortness of breath appears to be obesity related and exercise induced. The indirect calorimeter results showed VO2 of 273 and a REE of 1900. She has agreed to work on weight loss and gradually increase exercise to treat her exercise induced shortness of breath. If Hailey Noble follows our instructions and loses weight without improvement of her shortness of breath, we will plan to refer to  pulmonology. Hailey Noble agrees to this plan.  Obesity Hailey Noble is currently in the action stage of change. As such, her goal is to continue with weight loss efforts. She has agreed to change and follow the Category 3 plan. Her metabolism has increased so we will change her to the Category 3  plan. Hailey Noble has been instructed to work up to a goal of 150 minutes of combined cardio and strengthening exercise per week for weight loss and overall health benefits. We discussed the following Behavioral Modification Strategies today: work on meal planning and easy cooking plans, and keeping healthy foods in the home.  Hailey Noble has agreed to follow-up with our clinic in 2 weeks. She was informed of the importance of frequent follow-up visits to maximize her success with intensive lifestyle modifications for her multiple health conditions.  ALLERGIES: Allergies  Allergen Reactions   Coconut Fatty Acids Hives   Codeine    Hydrocod Polst-Cpm Polst Er     REACTION: rash on face and head, itching   Mango Flavor Hives   [name] Other (See Comments)   Oxycodone-Acetaminophen     REACTION: rash on face and head, itching   Tussin Cough [Dextromethorphan Hbr]     Broken out in hives   Ultram [Tramadol] Other (See Comments)    Dizzy and low blood pressure.     MEDICATIONS: Current Outpatient Medications on File Prior to Visit  Medication Sig Dispense Refill   acetaminophen (TYLENOL) 650 MG CR tablet Take 650 mg by mouth every 8 (eight) hours as needed. pain     Calcium Carbonate-Vit D-Min (CALCIUM 1200 PO) Take by mouth daily.       cetirizine (ZYRTEC) 10 MG tablet Take 10 mg by mouth daily.     Cholecalciferol (VITAMIN D-1000 MAX ST PO) Take 1 tablet by mouth.     escitalopram (LEXAPRO) 10 MG tablet TAKE 1 TABLET BY MOUTH ONCE DAILY 90 tablet 3   KRILL OIL 1000 MG CAPS Take 1 capsule by mouth daily.     metFORMIN (GLUCOPHAGE) 500 MG tablet Take 1 tablet (500 mg total) by mouth 2 (two) times daily with a meal. (Patient taking differently: Take 500 mg by mouth daily with breakfast. ) 60 tablet 0   mometasone (NASONEX) 50 MCG/ACT nasal spray Place 2 sprays into the nose daily. 17 g 6   Polyethyl Glycol-Propyl Glycol (SYSTANE FREE OP) Apply 1 drop to eye daily.      predniSONE (STERAPRED UNI-PAK 21 TAB) 10 MG (21) TBPK tablet Use as directed (Patient not taking: Reported on 06/15/2019) 21 tablet 0   triamcinolone ointment (KENALOG) 0.5 % Apply 1 application topically 2 (two) times daily. 30 g 0   No current facility-administered medications on file prior to visit.     PAST MEDICAL HISTORY: Past Medical History:  Diagnosis Date   Allergic state 12/08/2016   Allergy    Anxiety    Back pain    GERD (gastroesophageal reflux disease)    Hyperlipidemia 06/09/2017   Joint pain    Knee pain, right 03/10/2017   Plantar fasciitis    Shortness of breath    Sinusitis    Vitamin D deficiency 11/22/2016   Vitamin D deficiency     PAST SURGICAL HISTORY: Past Surgical History:  Procedure Laterality Date   COLONOSCOPY  02-2009   with Henrene Pastor normal   ESOPHAGOGASTRODUODENOSCOPY  2012   GANGLION CYST EXCISION Right    right- wrist    NASAL SEPTUM SURGERY     NASAL  SINUS SURGERY     TMJ ARTHROSCOPY     left    SOCIAL HISTORY: Social History   Tobacco Use   Smoking status: Never Smoker   Smokeless tobacco: Never Used  Substance Use Topics   Alcohol use: Yes    Alcohol/week: 0.0 standard drinks    Comment: rare   Drug use: No    FAMILY HISTORY: Family History  Problem Relation Age of Onset   Hypertension Father    Diabetes Father    Hyperlipidemia Father    Breast cancer Mother 78   Cancer Mother        breast   Thyroid disease Mother    Sleep apnea Mother    Colon cancer Paternal Grandfather        died at age 65   Cancer Paternal Grandfather        GI CA   Obesity Sister    Diabetes Sister    Hypertension Sister    Heart disease Maternal Grandmother        MI   Cancer Maternal Grandfather        lung, smoker   Diabetes Maternal Grandfather    Diabetes Paternal Grandmother    Heart disease Paternal Grandmother    Pancreatitis Sister    Esophageal cancer Neg Hx    Stomach cancer Neg Hx     Stroke Neg Hx    Colon polyps Neg Hx    Rectal cancer Neg Hx    Pancreatic cancer Neg Hx    ROS: Review of Systems  Respiratory: Positive for shortness of breath (with exertion).   Gastrointestinal: Negative for diarrhea, nausea and vomiting.  Musculoskeletal:       Negative for muscle weakness.  Endo/Heme/Allergies:       Negative for polyphagia.   PHYSICAL EXAM: Blood pressure 120/79, pulse 85, temperature 98.5 F (36.9 C), temperature source Oral, height 5\' 5"  (1.651 m), weight 206 lb (93.4 kg), SpO2 94 %. Body mass index is 34.28 kg/m. Physical Exam Vitals signs reviewed.  Constitutional:      Appearance: Normal appearance. She is obese.  Cardiovascular:     Rate and Rhythm: Normal rate.     Pulses: Normal pulses.  Pulmonary:     Effort: Pulmonary effort is normal.     Breath sounds: Normal breath sounds.  Musculoskeletal: Normal range of motion.  Skin:    General: Skin is warm and dry.  Neurological:     Mental Status: She is alert and oriented to person, place, and time.  Psychiatric:        Behavior: Behavior normal.   RECENT LABS AND TESTS: BMET    Component Value Date/Time   NA 140 12/30/2018 1004   K 4.4 12/30/2018 1004   CL 103 12/30/2018 1004   CO2 21 12/30/2018 1004   GLUCOSE 82 12/30/2018 1004   GLUCOSE 85 11/22/2016 1521   GLUCOSE 86 11/13/2009   BUN 18 12/30/2018 1004   CREATININE 0.58 12/30/2018 1004   CREATININE 0.67 11/22/2016 1521   CALCIUM 9.0 12/30/2018 1004   GFRNONAA 106 12/30/2018 1004   GFRAA 122 12/30/2018 1004   Lab Results  Component Value Date   HGBA1C 5.4 06/16/2019   HGBA1C 5.4 12/30/2018   HGBA1C 5.4 09/21/2018   HGBA1C 5.4 04/30/2018   HGBA1C 5.2 10/06/2017   Lab Results  Component Value Date   INSULIN 9.3 12/30/2018   INSULIN 11.1 09/21/2018   INSULIN 8.7 04/30/2018   INSULIN 8.8 10/06/2017   CBC  Component Value Date/Time   WBC 9.6 10/06/2017 0850   WBC 9.8 11/22/2016 1521   RBC 4.71 10/06/2017  0850   RBC 4.41 11/22/2016 1521   HGB 13.8 10/06/2017 0850   HCT 42.4 10/06/2017 0850   PLT 328 11/22/2016 1521   PLT 381 11/13/2009   MCV 90 10/06/2017 0850   MCH 29.3 10/06/2017 0850   MCH 29.7 11/22/2016 1521   MCHC 32.5 10/06/2017 0850   MCHC 32.2 11/22/2016 1521   RDW 13.5 10/06/2017 0850   LYMPHSABS 1.8 10/06/2017 0850   MONOABS 0.5 05/26/2015 0931   EOSABS 0.1 10/06/2017 0850   BASOSABS 0.1 10/06/2017 0850   Iron/TIBC/Ferritin/ %Sat    Component Value Date/Time   FERRITIN 50 11/09/2008   Lipid Panel     Component Value Date/Time   CHOL 162 12/30/2018 1004   TRIG 109 12/30/2018 1004   TRIG 138 11/13/2009 1337   HDL 51 12/30/2018 1004   CHOLHDL 3.3 04/30/2018 1042   CHOLHDL 3.9 11/22/2016 1521   VLDL 32 (H) 11/22/2016 1521   LDLCALC 89 12/30/2018 1004   Hepatic Function Panel     Component Value Date/Time   PROT 6.7 12/30/2018 1004   ALBUMIN 4.2 12/30/2018 1004   AST 14 12/30/2018 1004   ALT 20 12/30/2018 1004   ALKPHOS 144 (H) 12/30/2018 1004   BILITOT 0.2 12/30/2018 1004   BILIDIR 0.1 05/26/2015 0931      Component Value Date/Time   TSH 2.360 06/16/2019 0807   TSH 1.690 10/06/2017 0850   TSH 1.30 11/22/2016 1521   Results for Vastine, Zareth I (MRN [id number]) as of 07/15/2019 12:12  Ref. Range 06/16/2019 08:07  Vitamin D, 25-Hydroxy Latest Ref Range: 30.0 - 100.0 ng/mL 64.7   OBESITY BEHAVIORAL INTERVENTION VISIT  Today's visit was #35  Starting weight: 212 lbs Starting date: 10/06/2017 Today's weight: 206 lbs  Today's date: 07/15/2019 Total lbs lost to date: 6   07/15/2019  Height 5\' 5"  (1.651 m)  Weight 206 lb (93.4 kg)  BMI (Calculated) 34.28  BLOOD PRESSURE - SYSTOLIC 229  BLOOD PRESSURE - DIASTOLIC 79   Body Fat % 79.8 %  Total Body Water (lbs) 80.6 lbs  RMR 1900   ASK: We discussed the diagnosis of obesity with Hailey Noble today and Hailey Noble agreed to give [name] permission to discuss obesity behavioral modification therapy  today.  ASSESS: Jannely has the diagnosis of obesity and her BMI today is 34.3. Joslyn is in the action stage of change.   ADVISE: Hailey Noble was educated on the multiple health risks of obesity as well as the benefit of weight loss to improve her health. She was advised of the need for long term treatment and the importance of lifestyle modifications to improve her current health and to decrease her risk of future health problems.  AGREE: Multiple dietary modification options and treatment options were discussed and  Hailey Noble agreed to follow the recommendations documented in the above note.  ARRANGE: Hailey Noble was educated on the importance of frequent visits to treat obesity as outlined per CMS and USPSTF guidelines and agreed to schedule her next follow up appointment today.  Hailey Noble, am acting as transcriptionist for Abby Potash, PA-C I, Abby Potash, PA-C have reviewed above note and agree with its content

## 2019-07-21 DIAGNOSIS — B342 Coronavirus infection, unspecified: Secondary | ICD-10-CM | POA: Diagnosis not present

## 2019-08-02 MED FILL — ESCITALOPRAM 10 MG TABLET: 10 | 90 days supply | Qty: 90 | Fill #0

## 2019-08-04 ENCOUNTER — Other Ambulatory Visit: Payer: Self-pay

## 2019-08-04 ENCOUNTER — Encounter (INDEPENDENT_AMBULATORY_CARE_PROVIDER_SITE_OTHER): Payer: Self-pay | Admitting: Physician Assistant

## 2019-08-04 ENCOUNTER — Ambulatory Visit (INDEPENDENT_AMBULATORY_CARE_PROVIDER_SITE_OTHER): Payer: 59 | Admitting: Physician Assistant

## 2019-08-04 VITALS — BP 110/78 | HR 87 | Temp 98.7°F | Ht 65.0 in | Wt 209.0 lb

## 2019-08-04 DIAGNOSIS — E669 Obesity, unspecified: Secondary | ICD-10-CM | POA: Diagnosis not present

## 2019-08-04 DIAGNOSIS — E559 Vitamin D deficiency, unspecified: Secondary | ICD-10-CM | POA: Diagnosis not present

## 2019-08-04 DIAGNOSIS — Z6834 Body mass index (BMI) 34.0-34.9, adult: Secondary | ICD-10-CM

## 2019-08-09 NOTE — Progress Notes (Signed)
Office: [phone]  /  Fax: [phone]   HPI:   Chief Complaint: OBESITY Hailey Noble is here to discuss her progress with her obesity treatment plan. She is on the Category 3 plan and is following her eating plan approximately 90% of the time. She states she is swimming 45 minutes 4 times per week. Hailey Noble reports that she has been more stressed and doing a lot of emotional eating. She wants to start journaling again. Her weight is 209 lb (94.8 kg) today and has had a weight gain of 3 lbs since her last visit. She has lost 3 lbs since starting treatment with [name].  Vitamin D deficiency Hailey Noble has a diagnosis of Vitamin D deficiency. She is currently taking OTC Vit D and denies nausea, vomiting or muscle weakness.  ASSESSMENT AND PLAN:  Vitamin D deficiency  Class 1 obesity with serious comorbidity and body mass index (BMI) of 34.0 to 34.9 in adult, unspecified obesity type  PLAN:  Vitamin D Deficiency Hailey Noble was informed that low Vitamin D levels contributes to fatigue and are associated with obesity, breast, and colon cancer. She agrees to continue taking OTC Vit D and will follow-up for routine testing of Vitamin D, at least 2-3 times per year. She was informed of the risk of over-replacement of Vitamin D and agrees to not increase her dose unless she discusses this with [name] first. Hailey Noble agrees to follow-up with our clinic in 2 weeks.  I spent > than 50% of the 15 minute visit on counseling as documented in the note.  Obesity Hailey Noble is currently in the action stage of change. As such, her goal is to continue with weight loss efforts. She has agreed to keep a food journal with 1400-1500 calories and 95 grams of protein daily. Hailey Noble has been instructed to work up to a goal of 150 minutes of combined cardio and strengthening exercise per week for weight loss and overall health benefits. We discussed the following Behavioral Modification Strategies today: work on meal planning and easy  cooking plans, and keeping healthy foods in the home.  Hailey Noble has agreed to follow-up with our clinic in 2 weeks. She was informed of the importance of frequent follow-up visits to maximize her success with intensive lifestyle modifications for her multiple health conditions.  ALLERGIES: Allergies  Allergen Reactions   Coconut Fatty Acids Hives   Codeine    Hydrocod Polst-Cpm Polst Er     REACTION: rash on face and head, itching   Mango Flavor Hives   [name] Other (See Comments)   Oxycodone-Acetaminophen     REACTION: rash on face and head, itching   Tussin Cough [Dextromethorphan Hbr]     Broken out in hives   Ultram [Tramadol] Other (See Comments)    Dizzy and low blood pressure.     MEDICATIONS: Current Outpatient Medications on File Prior to Visit  Medication Sig Dispense Refill   acetaminophen (TYLENOL) 650 MG CR tablet Take 650 mg by mouth every 8 (eight) hours as needed. pain     Calcium Carbonate-Vit D-Min (CALCIUM 1200 PO) Take by mouth daily.       cetirizine (ZYRTEC) 10 MG tablet Take 10 mg by mouth daily.     Cholecalciferol (VITAMIN D-1000 MAX ST PO) Take 1 tablet by mouth.     escitalopram (LEXAPRO) 10 MG tablet TAKE 1 TABLET BY MOUTH ONCE DAILY 90 tablet 3   KRILL OIL 1000 MG CAPS Take 1 capsule by mouth daily.     metFORMIN (  GLUCOPHAGE) 500 MG tablet Take 1 tablet (500 mg total) by mouth 2 (two) times daily with a meal. (Patient taking differently: Take 500 mg by mouth daily with breakfast. ) 60 tablet 0   mometasone (NASONEX) 50 MCG/ACT nasal spray Place 2 sprays into the nose daily. 17 g 6   Polyethyl Glycol-Propyl Glycol (SYSTANE FREE OP) Apply 1 drop to eye daily.     predniSONE (STERAPRED UNI-PAK 21 TAB) 10 MG (21) TBPK tablet Use as directed (Patient not taking: Reported on 06/15/2019) 21 tablet 0   triamcinolone ointment (KENALOG) 0.5 % Apply 1 application topically 2 (two) times daily. 30 g 0   No current facility-administered  medications on file prior to visit.     PAST MEDICAL HISTORY: Past Medical History:  Diagnosis Date   Allergic state 12/08/2016   Allergy    Anxiety    Back pain    GERD (gastroesophageal reflux disease)    Hyperlipidemia 06/09/2017   Joint pain    Knee pain, right 03/10/2017   Plantar fasciitis    Shortness of breath    Sinusitis    Vitamin D deficiency 11/22/2016   Vitamin D deficiency     PAST SURGICAL HISTORY: Past Surgical History:  Procedure Laterality Date   COLONOSCOPY  02-2009   with Henrene Pastor normal   ESOPHAGOGASTRODUODENOSCOPY  2012   GANGLION CYST EXCISION Right    right- wrist    NASAL SEPTUM SURGERY     NASAL SINUS SURGERY     TMJ ARTHROSCOPY     left    SOCIAL HISTORY: Social History   Tobacco Use   Smoking status: Never Smoker   Smokeless tobacco: Never Used  Substance Use Topics   Alcohol use: Yes    Alcohol/week: 0.0 standard drinks    Comment: rare   Drug use: No    FAMILY HISTORY: Family History  Problem Relation Age of Onset   Hypertension Father    Diabetes Father    Hyperlipidemia Father    Breast cancer Mother 73   Cancer Mother        breast   Thyroid disease Mother    Sleep apnea Mother    Colon cancer Paternal Grandfather        died at age 65   Cancer Paternal Grandfather        GI CA   Obesity Sister    Diabetes Sister    Hypertension Sister    Heart disease Maternal Grandmother        MI   Cancer Maternal Grandfather        lung, smoker   Diabetes Maternal Grandfather    Diabetes Paternal Grandmother    Heart disease Paternal Grandmother    Pancreatitis Sister    Esophageal cancer Neg Hx    Stomach cancer Neg Hx    Stroke Neg Hx    Colon polyps Neg Hx    Rectal cancer Neg Hx    Pancreatic cancer Neg Hx    ROS: Review of Systems  Gastrointestinal: Negative for nausea and vomiting.  Musculoskeletal:       Negative for muscle weakness.   PHYSICAL EXAM: Blood  pressure 110/78, pulse 87, temperature 98.7 F (37.1 C), temperature source Oral, height 5\' 5"  (1.651 m), weight 209 lb (94.8 kg), SpO2 94 %. Body mass index is 34.78 kg/m. Physical Exam Vitals signs reviewed.  Constitutional:      Appearance: Normal appearance. She is obese.  Cardiovascular:     Rate and Rhythm:  Normal rate.     Pulses: Normal pulses.  Pulmonary:     Effort: Pulmonary effort is normal.     Breath sounds: Normal breath sounds.  Musculoskeletal: Normal range of motion.  Skin:    General: Skin is warm and dry.  Neurological:     Mental Status: She is alert and oriented to person, place, and time.  Psychiatric:        Behavior: Behavior normal.   RECENT LABS AND TESTS: BMET    Component Value Date/Time   NA 140 12/30/2018 1004   K 4.4 12/30/2018 1004   CL 103 12/30/2018 1004   CO2 21 12/30/2018 1004   GLUCOSE 82 12/30/2018 1004   GLUCOSE 85 11/22/2016 1521   GLUCOSE 86 11/13/2009   BUN 18 12/30/2018 1004   CREATININE 0.58 12/30/2018 1004   CREATININE 0.67 11/22/2016 1521   CALCIUM 9.0 12/30/2018 1004   GFRNONAA 106 12/30/2018 1004   GFRAA 122 12/30/2018 1004   Lab Results  Component Value Date   HGBA1C 5.4 06/16/2019   HGBA1C 5.4 12/30/2018   HGBA1C 5.4 09/21/2018   HGBA1C 5.4 04/30/2018   HGBA1C 5.2 10/06/2017   Lab Results  Component Value Date   INSULIN 9.3 12/30/2018   INSULIN 11.1 09/21/2018   INSULIN 8.7 04/30/2018   INSULIN 8.8 10/06/2017   CBC    Component Value Date/Time   WBC 9.6 10/06/2017 0850   WBC 9.8 11/22/2016 1521   RBC 4.71 10/06/2017 0850   RBC 4.41 11/22/2016 1521   HGB 13.8 10/06/2017 0850   HCT 42.4 10/06/2017 0850   PLT 328 11/22/2016 1521   PLT 381 11/13/2009   MCV 90 10/06/2017 0850   MCH 29.3 10/06/2017 0850   MCH 29.7 11/22/2016 1521   MCHC 32.5 10/06/2017 0850   MCHC 32.2 11/22/2016 1521   RDW 13.5 10/06/2017 0850   LYMPHSABS 1.8 10/06/2017 0850   MONOABS 0.5 05/26/2015 0931   EOSABS 0.1 10/06/2017  0850   BASOSABS 0.1 10/06/2017 0850   Iron/TIBC/Ferritin/ %Sat    Component Value Date/Time   FERRITIN 50 11/09/2008   Lipid Panel     Component Value Date/Time   CHOL 162 12/30/2018 1004   TRIG 109 12/30/2018 1004   TRIG 138 11/13/2009 1337   HDL 51 12/30/2018 1004   CHOLHDL 3.3 04/30/2018 1042   CHOLHDL 3.9 11/22/2016 1521   VLDL 32 (H) 11/22/2016 1521   LDLCALC 89 12/30/2018 1004   Hepatic Function Panel     Component Value Date/Time   PROT 6.7 12/30/2018 1004   ALBUMIN 4.2 12/30/2018 1004   AST 14 12/30/2018 1004   ALT 20 12/30/2018 1004   ALKPHOS 144 (H) 12/30/2018 1004   BILITOT 0.2 12/30/2018 1004   BILIDIR 0.1 05/26/2015 0931      Component Value Date/Time   TSH 2.360 06/16/2019 0807   TSH 1.690 10/06/2017 0850   TSH 1.30 11/22/2016 1521   Results for CAMPKalissa, Grays I (MRN [id number]) as of 08/09/2019 07:23  Ref. Range 06/16/2019 08:07  Vitamin D, 25-Hydroxy Latest Ref Range: 30.0 - 100.0 ng/mL 64.7   OBESITY BEHAVIORAL INTERVENTION VISIT  Today's visit was #36  Starting weight: 212 lbs Starting date: 10/06/2017 Today's weight: 209 lbs Today's date: 08/04/2019 Total lbs lost to date: 3    08/04/2019  Height 5\' 5"  (1.651 m)  Weight 209 lb (94.8 kg)  BMI (Calculated) 34.78  BLOOD PRESSURE - SYSTOLIC 540  BLOOD PRESSURE - DIASTOLIC 78   Body Fat % 08.6 %  Total Body Water (lbs) 84.2 lbs   ASK: We discussed the diagnosis of obesity with Hailey Noble today and Hailey Noble agreed to give [name] permission to discuss obesity behavioral modification therapy today.  ASSESS: Hailey Noble has the diagnosis of obesity and her BMI today is 34.8. Hailey Noble is in the action stage of change.   ADVISE: Hailey Noble was educated on the multiple health risks of obesity as well as the benefit of weight loss to improve her health. She was advised of the need for long term treatment and the importance of lifestyle modifications to improve her current health and to decrease her risk of future  health problems.  AGREE: Multiple dietary modification options and treatment options were discussed and  Hailey Noble agreed to follow the recommendations documented in the above note.  ARRANGE: Hailey Noble was educated on the importance of frequent visits to treat obesity as outlined per CMS and USPSTF guidelines and agreed to schedule her next follow up appointment today.  Hailey Noble, am acting as transcriptionist for Abby Potash, PA-C I, Abby Potash, PA-C have reviewed above note and agree with its content

## 2019-08-26 ENCOUNTER — Ambulatory Visit (INDEPENDENT_AMBULATORY_CARE_PROVIDER_SITE_OTHER): Payer: 59 | Admitting: Physician Assistant

## 2019-09-07 ENCOUNTER — Encounter (INDEPENDENT_AMBULATORY_CARE_PROVIDER_SITE_OTHER): Payer: Self-pay

## 2019-09-09 ENCOUNTER — Encounter (INDEPENDENT_AMBULATORY_CARE_PROVIDER_SITE_OTHER): Payer: Self-pay | Admitting: Family Medicine

## 2019-09-09 ENCOUNTER — Other Ambulatory Visit: Payer: Self-pay

## 2019-09-09 ENCOUNTER — Ambulatory Visit (INDEPENDENT_AMBULATORY_CARE_PROVIDER_SITE_OTHER): Payer: 59 | Admitting: Family Medicine

## 2019-09-09 VITALS — BP 149/76 | HR 82 | Temp 97.5°F | Ht 65.0 in | Wt 207.0 lb

## 2019-09-09 DIAGNOSIS — E8881 Metabolic syndrome: Secondary | ICD-10-CM | POA: Diagnosis not present

## 2019-09-09 DIAGNOSIS — F411 Generalized anxiety disorder: Secondary | ICD-10-CM | POA: Diagnosis not present

## 2019-09-09 DIAGNOSIS — Z9189 Other specified personal risk factors, not elsewhere classified: Secondary | ICD-10-CM | POA: Diagnosis not present

## 2019-09-09 DIAGNOSIS — E669 Obesity, unspecified: Secondary | ICD-10-CM | POA: Diagnosis not present

## 2019-09-09 DIAGNOSIS — Z6834 Body mass index (BMI) 34.0-34.9, adult: Secondary | ICD-10-CM | POA: Diagnosis not present

## 2019-09-09 MED ORDER — METFORMIN HCL 500 MG PO TABS: 500.0000 mg | ORAL_TABLET | Freq: Two times a day (BID) | ORAL | 0 refills | Status: DC

## 2019-09-09 MED FILL — metFORMIN HCL 500 MG TABS: 500 | 30 days supply | Qty: 60 | Fill #0

## 2019-09-14 NOTE — Progress Notes (Signed)
Office: [phone]  /  Fax: [phone]   HPI:   Chief Complaint: OBESITY Hailey Noble is here to discuss her progress with her obesity treatment plan. She is on the keep a food journal with 1400-1500 calories and 95 grams of protein daily and is following her eating plan approximately 90 % of the time. She states she is swimming and walking for 30-45 minutes 3 times per week. [name] voices the last few weeks has been so busy with work, she has struggled to continue walking with work schedule and heat. She has done more of Category 3 than journaling. She is feeling overwhelmed with how much work she has left to do at home before getting her hysterectomy at the end of October.  Her weight is 207 lb (93.9 kg) today and has had a weight loss of 2 pounds over a period of 5 weeks since her last visit. She has lost 5 lbs since starting treatment with [name].  Generalized Anxiety Disorder [name] is on Lexapro 10 mg. She notes her symptoms have significantly worsened. She is waffling between frustration, anger, and crying.  Insulin Resistance [name] has a diagnosis of insulin resistance based on her elevated fasting insulin level >5. Although [name]'s blood glucose readings are still under good control, insulin resistance puts her at greater risk of metabolic syndrome and diabetes. She is taking metformin currently and denies GI side effects. She notes better carbohydrate control. She continues to work on diet and exercise to decrease risk of diabetes.  At risk for diabetes [name] is at higher than average risk for developing diabetes due to her obesity and insulin resistance. She currently denies polyuria or polydipsia.  ASSESSMENT AND PLAN:  GAD (generalized anxiety disorder)  Insulin resistance - Plan: metFORMIN (GLUCOPHAGE) 500 MG tablet  At risk for diabetes mellitus  Class 1 obesity with serious comorbidity and body mass index (BMI) of 34.0 to 34.9 in adult, unspecified obesity type  PLAN:   Generalized Anxiety Disorder [name] agrees to increase Lexapro to 15 mg PO daily, no refill needed. [name] agrees to follow up with our clinic in 2 weeks.  Insulin Resistance [name] will continue to work on weight loss, exercise, and decreasing simple carbohydrates in her diet to help decrease the risk of diabetes. We dicussed metformin including benefits and risks. She was informed that eating too many simple carbohydrates or too many calories at one sitting increases the likelihood of GI side effects. [name] agrees to continue taking metformin 500 mg PO BID #60 and we will refill for 1 month. [name] agrees to follow up with our clinic in 2 weeks as directed to monitor her progress.  Diabetes risk counseling [name] was given extended (15 minutes) diabetes prevention counseling today. She is 53 y.o. female and has risk factors for diabetes including obesity and insulin resistance. We discussed intensive lifestyle modifications today with an emphasis on weight loss as well as increasing exercise and decreasing simple carbohydrates in her diet.  Obesity [name] is currently in the action stage of change. As such, her goal is to continue with weight loss efforts She has agreed to follow the Category 3 plan [name] has been instructed to work up to a goal of 150 minutes of combined cardio and strengthening exercise per week for weight loss and overall health benefits. We discussed the following Behavioral Modification Strategies today: increasing lean protein intake, increasing vegetables and work on meal planning and easy cooking plans, keeping healthy foods in the home, and planning for success  [name] has agreed to follow up with our clinic in 2 weeks. She was informed of the importance of frequent follow up visits to maximize her success with intensive lifestyle modifications for her multiple health conditions.  ALLERGIES: Allergies  Allergen Reactions  . Coconut Fatty Acids Hives  . Codeine    . Hydrocod Polst-Cpm Polst Er     REACTION: rash on face and head, itching  . Mango Flavor Hives  . [name] Other (See Comments)  . Oxycodone-Acetaminophen     REACTION: rash on face and head, itching  . Tussin Cough [Dextromethorphan Hbr]     Broken out in hives  . Ultram [Tramadol] Other (See Comments)    Dizzy and low blood pressure.     MEDICATIONS: Current Outpatient Medications on File Prior to Visit  Medication Sig Dispense Refill  . acetaminophen (TYLENOL) 650 MG CR tablet Take 650 mg by mouth every 8 (eight) hours as needed. pain    . Calcium Carbonate-Vit D-Min (CALCIUM 1200 PO) Take by mouth daily.      . cetirizine (ZYRTEC) 10 MG tablet Take 10 mg by mouth daily.    . Cholecalciferol (VITAMIN D-1000 MAX ST PO) Take 1 tablet by mouth.    . escitalopram (LEXAPRO) 10 MG tablet TAKE 1 TABLET BY MOUTH ONCE DAILY (Patient taking differently: 15 mg. ) 90 tablet 3  . KRILL OIL 1000 MG CAPS Take 1 capsule by mouth daily.    . mometasone (NASONEX) 50 MCG/ACT nasal spray Place 2 sprays into the nose daily. 17 g 6  . Polyethyl Glycol-Propyl Glycol (SYSTANE FREE OP) Apply 1 drop to eye daily.    . predniSONE (STERAPRED UNI-PAK 21 TAB) 10 MG (21) TBPK tablet Use as directed (Patient not taking: Reported on 06/15/2019) 21 tablet 0  . triamcinolone ointment (KENALOG) 0.5 % Apply 1 application topically 2 (two) times daily. 30 g 0   No current facility-administered medications on file prior to visit.     PAST MEDICAL HISTORY: Past Medical History:  Diagnosis Date  . Allergic state 12/08/2016  . Allergy   . Anxiety   . Back pain   . GERD (gastroesophageal reflux disease)   . Hyperlipidemia 06/09/2017  . Joint pain   . Knee pain, right 03/10/2017  . Plantar fasciitis   . Shortness of breath   . Sinusitis   . Vitamin D deficiency 11/22/2016  . Vitamin D deficiency     PAST SURGICAL HISTORY: Past Surgical History:  Procedure Laterality Date  . COLONOSCOPY  02-2009   with  Henrene Pastor normal  . ESOPHAGOGASTRODUODENOSCOPY  2012  . GANGLION CYST EXCISION Right    right- wrist   . NASAL SEPTUM SURGERY    . NASAL SINUS SURGERY    . TMJ ARTHROSCOPY     left    SOCIAL HISTORY: Social History   Tobacco Use  . Smoking status: Never Smoker  . Smokeless tobacco: Never Used  Substance Use Topics  . Alcohol use: Yes    Alcohol/week: 0.0 standard drinks    Comment: rare  . Drug use: No    FAMILY HISTORY: Family History  Problem Relation Age of Onset  . Hypertension Father   . Diabetes Father   . Hyperlipidemia Father   . Breast cancer Mother 34  . Cancer Mother        breast  . Thyroid disease Mother   . Sleep apnea Mother   . Colon cancer Paternal Grandfather        died  at age 66  . Cancer Paternal Grandfather        GI CA  . Obesity Sister   . Diabetes Sister   . Hypertension Sister   . Heart disease Maternal Grandmother        MI  . Cancer Maternal Grandfather        lung, smoker  . Diabetes Maternal Grandfather   . Diabetes Paternal Grandmother   . Heart disease Paternal Grandmother   . Pancreatitis Sister   . Esophageal cancer Neg Hx   . Stomach cancer Neg Hx   . Stroke Neg Hx   . Colon polyps Neg Hx   . Rectal cancer Neg Hx   . Pancreatic cancer Neg Hx     ROS: Review of Systems  Constitutional: Positive for weight loss.  Genitourinary: Negative for frequency.  Endo/Heme/Allergies: Negative for polydipsia.    PHYSICAL EXAM: Blood pressure (!) 149/76, pulse 82, temperature (!) 97.5 F (36.4 C), temperature source Oral, height 5\' 5"  (1.651 m), weight 207 lb (93.9 kg), SpO2 96 %. Body mass index is 34.45 kg/m. Physical Exam Vitals signs reviewed.  Constitutional:      Appearance: Normal appearance. She is obese.  Cardiovascular:     Rate and Rhythm: Normal rate.     Pulses: Normal pulses.  Pulmonary:     Effort: Pulmonary effort is normal.     Breath sounds: Normal breath sounds.  Musculoskeletal: Normal range of motion.   Skin:    General: Skin is warm and dry.  Neurological:     Mental Status: She is alert and oriented to person, place, and time.  Psychiatric:        Mood and Affect: Mood normal.        Behavior: Behavior normal.     RECENT LABS AND TESTS: BMET    Component Value Date/Time   NA 140 12/30/2018 1004   K 4.4 12/30/2018 1004   CL 103 12/30/2018 1004   CO2 21 12/30/2018 1004   GLUCOSE 82 12/30/2018 1004   GLUCOSE 85 11/22/2016 1521   GLUCOSE 86 11/13/2009   BUN 18 12/30/2018 1004   CREATININE 0.58 12/30/2018 1004   CREATININE 0.67 11/22/2016 1521   CALCIUM 9.0 12/30/2018 1004   GFRNONAA 106 12/30/2018 1004   GFRAA 122 12/30/2018 1004   Lab Results  Component Value Date   HGBA1C 5.4 06/16/2019   HGBA1C 5.4 12/30/2018   HGBA1C 5.4 09/21/2018   HGBA1C 5.4 04/30/2018   HGBA1C 5.2 10/06/2017   Lab Results  Component Value Date   INSULIN 9.3 12/30/2018   INSULIN 11.1 09/21/2018   INSULIN 8.7 04/30/2018   INSULIN 8.8 10/06/2017   CBC    Component Value Date/Time   WBC 9.6 10/06/2017 0850   WBC 9.8 11/22/2016 1521   RBC 4.71 10/06/2017 0850   RBC 4.41 11/22/2016 1521   HGB 13.8 10/06/2017 0850   HCT 42.4 10/06/2017 0850   PLT 328 11/22/2016 1521   PLT 381 11/13/2009   MCV 90 10/06/2017 0850   MCH 29.3 10/06/2017 0850   MCH 29.7 11/22/2016 1521   MCHC 32.5 10/06/2017 0850   MCHC 32.2 11/22/2016 1521   RDW 13.5 10/06/2017 0850   LYMPHSABS 1.8 10/06/2017 0850   MONOABS 0.5 05/26/2015 0931   EOSABS 0.1 10/06/2017 0850   BASOSABS 0.1 10/06/2017 0850   Iron/TIBC/Ferritin/ %Sat    Component Value Date/Time   FERRITIN 50 11/09/2008   Lipid Panel     Component Value Date/Time  CHOL 162 12/30/2018 1004   TRIG 109 12/30/2018 1004   TRIG 138 11/13/2009 1337   HDL 51 12/30/2018 1004   CHOLHDL 3.3 04/30/2018 1042   CHOLHDL 3.9 11/22/2016 1521   VLDL 32 (H) 11/22/2016 1521   LDLCALC 89 12/30/2018 1004   Hepatic Function Panel     Component Value Date/Time    PROT 6.7 12/30/2018 1004   ALBUMIN 4.2 12/30/2018 1004   AST 14 12/30/2018 1004   ALT 20 12/30/2018 1004   ALKPHOS 144 (H) 12/30/2018 1004   BILITOT 0.2 12/30/2018 1004   BILIDIR 0.1 05/26/2015 0931      Component Value Date/Time   TSH 2.360 06/16/2019 0807   TSH 1.690 10/06/2017 0850   TSH 1.30 11/22/2016 1521      OBESITY BEHAVIORAL INTERVENTION VISIT  Today's visit was # 37   Starting weight: 212 lbs Starting date: 10/06/17 Today's weight : 207 lbs Today's date: 09/09/2019 Total lbs lost to date: 5    ASK: We discussed the diagnosis of obesity with Hebert Soho today and Yahira agreed to give [name] permission to discuss obesity behavioral modification therapy today.  ASSESS: Connor has the diagnosis of obesity and her BMI today is 34.45 Tenille is in the action stage of change   ADVISE: Emory was educated on the multiple health risks of obesity as well as the benefit of weight loss to improve her health. She was advised of the need for long term treatment and the importance of lifestyle modifications to improve her current health and to decrease her risk of future health problems.  AGREE: Multiple dietary modification options and treatment options were discussed and  Carie agreed to follow the recommendations documented in the above note.  ARRANGE: Silva was educated on the importance of frequent visits to treat obesity as outlined per CMS and USPSTF guidelines and agreed to schedule her next follow up appointment today.  I, Trixie Dredge, am acting as transcriptionist for Ilene Qua, MD  I have reviewed the above documentation for accuracy and completeness, and I agree with the above. - Ilene Qua, MD

## 2019-09-23 HISTORY — PX: ABDOMINAL HYSTERECTOMY: SHX81

## 2019-09-23 HISTORY — PX: TOTAL ABDOMINAL HYSTERECTOMY: SHX209

## 2019-09-27 ENCOUNTER — Ambulatory Visit (INDEPENDENT_AMBULATORY_CARE_PROVIDER_SITE_OTHER): Payer: 59 | Admitting: Family Medicine

## 2019-09-27 ENCOUNTER — Encounter (INDEPENDENT_AMBULATORY_CARE_PROVIDER_SITE_OTHER): Payer: Self-pay | Admitting: Family Medicine

## 2019-09-27 ENCOUNTER — Other Ambulatory Visit: Payer: Self-pay

## 2019-09-27 VITALS — BP 157/80 | HR 83 | Temp 97.8°F | Ht 65.0 in | Wt 208.0 lb

## 2019-09-27 DIAGNOSIS — E66811 Obesity, class 1: Secondary | ICD-10-CM

## 2019-09-27 DIAGNOSIS — E8881 Metabolic syndrome: Secondary | ICD-10-CM | POA: Diagnosis not present

## 2019-09-27 DIAGNOSIS — F411 Generalized anxiety disorder: Secondary | ICD-10-CM

## 2019-09-27 DIAGNOSIS — R030 Elevated blood-pressure reading, without diagnosis of hypertension: Secondary | ICD-10-CM

## 2019-09-27 DIAGNOSIS — Z789 Other specified health status: Secondary | ICD-10-CM | POA: Diagnosis not present

## 2019-09-27 DIAGNOSIS — Z6834 Body mass index (BMI) 34.0-34.9, adult: Secondary | ICD-10-CM

## 2019-09-27 DIAGNOSIS — E669 Obesity, unspecified: Secondary | ICD-10-CM

## 2019-09-27 DIAGNOSIS — E88819 Insulin resistance, unspecified: Secondary | ICD-10-CM

## 2019-09-27 DIAGNOSIS — D259 Leiomyoma of uterus, unspecified: Secondary | ICD-10-CM | POA: Diagnosis not present

## 2019-09-27 DIAGNOSIS — R102 Pelvic and perineal pain: Secondary | ICD-10-CM | POA: Diagnosis not present

## 2019-09-28 NOTE — Progress Notes (Signed)
Office: [phone]  /  Fax: [phone]   HPI:   Chief Complaint: OBESITY Hailey Noble is here to discuss her progress with her obesity treatment plan. She is on the Category 3 plan and is following her eating plan approximately 90 % of the time. She states she is swimming for 30 minutes 2 times per weeks, and walking for 45-60 minutes 2 times per weeks. Hailey Noble is feeling better especially after increase in Lexapro to 15 mg daily. 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 She has a hysterectomy scheduled for 10/20.  Her weight is 208 lb (94.3 kg) today and has gained 1 lb since her last visit. She has lost 4 lbs since starting treatment with [name].  Generalized Anxiety Disorder Hailey Noble is still occasionally having overwhelming list of to do's, but she is not panicky.  Insulin Resistance Hailey Noble has a diagnosis of insulin resistance based on her elevated fasting insulin level >5. Last Hgb A1c was of 5.4 and insulin of 9.3. Although Hailey Noble's blood glucose readings are still under good control, insulin resistance puts her at greater risk of metabolic syndrome and diabetes. She is not taking metformin currently and continues to work on diet and exercise to decrease risk of diabetes.  Elevated Blood Pressure Hailey Noble's blood pressure was 157/80 today, previously 149/79. She denies chest pain, chest pressure, or headaches.  ASSESSMENT AND PLAN:  GAD (generalized anxiety disorder)  Insulin resistance  Elevated BP without diagnosis of hypertension  Class 1 obesity with serious comorbidity and body mass index (BMI) of 34.0 to 34.9 in adult, unspecified obesity type  PLAN:  Generalized Anxiety Disorder Hailey Noble agrees to continue taking Lexapro 15 mg, no refill needed. Hailey Noble agrees to follow up with our clinic in 4 weeks.   Insulin Resistance Hailey Noble will continue Category 3 plan and transition to journaling after her surgery. She will continue to work on weight loss,  exercise, and decreasing simple carbohydrates in her diet to help decrease the risk of diabetes. We dicussed metformin including benefits and risks. She was informed that eating too many simple carbohydrates or too many calories at one sitting increases the likelihood of GI side effects. Hailey Noble agrees to follow up with [name] as directed to monitor her progress.  Elevated Blood Pressure We will recheck her blood pressure, if it is elevated at her pre-op is to start ACE at the lowest dose. Hailey Noble agrees to follow up with our clinic in 4 weeks.  I spent > than 50% of the 15 minute visit on counseling as documented in the note.  Obesity Hailey Noble is currently in the action stage of change. As such, her goal is to continue with weight loss efforts She has agreed to keep a food journal with 1400-1500 calories and 90+ grams of protein daily or follow the Category 3 plan Hailey Noble has been instructed to work up to a goal of 150 minutes of combined cardio and strengthening exercise per week for weight loss and overall health benefits. We discussed the following Behavioral Modification Strategies today: increasing lean protein intake, increasing vegetables and work on meal planning and easy cooking plans, planning for success, and keep a strict food journal   Hailey Noble has agreed to follow up with our clinic in 4 weeks. She was informed of the importance of frequent follow up visits to maximize her success with intensive lifestyle modifications for her multiple health conditions.  ALLERGIES: Allergies  Allergen Reactions  . Coconut Fatty Acids Hives  .  Codeine   . Hydrocod Polst-Cpm Polst Er     REACTION: rash on face and head, itching  . Mango Flavor Hives  . [name] Other (See Comments)  . Oxycodone-Acetaminophen     REACTION: rash on face and head, itching  . Tussin Cough [Dextromethorphan Hbr]     Broken out in hives  . Ultram [Tramadol] Other (See Comments)    Dizzy and low blood pressure.      MEDICATIONS: Current Outpatient Medications on File Prior to Visit  Medication Sig Dispense Refill  . acetaminophen (TYLENOL) 650 MG CR tablet Take 650 mg by mouth every 8 (eight) hours as needed. pain    . Calcium Carbonate-Vit D-Min (CALCIUM 1200 PO) Take by mouth daily.      . cetirizine (ZYRTEC) 10 MG tablet Take 10 mg by mouth daily.    . Cholecalciferol (VITAMIN D-1000 MAX ST PO) Take 1 tablet by mouth.    . escitalopram (LEXAPRO) 10 MG tablet TAKE 1 TABLET BY MOUTH ONCE DAILY (Patient taking differently: 15 mg. ) 90 tablet 3  . KRILL OIL 1000 MG CAPS Take 1 capsule by mouth daily.    . metFORMIN (GLUCOPHAGE) 500 MG tablet Take 1 tablet (500 mg total) by mouth 2 (two) times daily with a meal. 60 tablet 0  . mometasone (NASONEX) 50 MCG/ACT nasal spray Place 2 sprays into the nose daily. 17 g 6  . Polyethyl Glycol-Propyl Glycol (SYSTANE FREE OP) Apply 1 drop to eye daily.    . predniSONE (STERAPRED UNI-PAK 21 TAB) 10 MG (21) TBPK tablet Use as directed (Patient not taking: Reported on 06/15/2019) 21 tablet 0  . triamcinolone ointment (KENALOG) 0.5 % Apply 1 application topically 2 (two) times daily. 30 g 0   No current facility-administered medications on file prior to visit.     PAST MEDICAL HISTORY: Past Medical History:  Diagnosis Date  . Allergic state 12/08/2016  . Allergy   . Anxiety   . Back pain   . GERD (gastroesophageal reflux disease)   . Hyperlipidemia 06/09/2017  . Joint pain   . Knee pain, right 03/10/2017  . Plantar fasciitis   . Shortness of breath   . Sinusitis   . Vitamin D deficiency 11/22/2016  . Vitamin D deficiency     PAST SURGICAL HISTORY: Past Surgical History:  Procedure Laterality Date  . COLONOSCOPY  02-2009   with Henrene Pastor normal  . ESOPHAGOGASTRODUODENOSCOPY  2012  . GANGLION CYST EXCISION Right    right- wrist   . NASAL SEPTUM SURGERY    . NASAL SINUS SURGERY    . TMJ ARTHROSCOPY     left    SOCIAL HISTORY: Social History   Tobacco Use   . Smoking status: Never Smoker  . Smokeless tobacco: Never Used  Substance Use Topics  . Alcohol use: Yes    Alcohol/week: 0.0 standard drinks    Comment: rare  . Drug use: No    FAMILY HISTORY: Family History  Problem Relation Age of Onset  . Hypertension Father   . Diabetes Father   . Hyperlipidemia Father   . Breast cancer Mother 68  . Cancer Mother        breast  . Thyroid disease Mother   . Sleep apnea Mother   . Colon cancer Paternal Grandfather        died at age 73  . Cancer Paternal Grandfather        GI CA  . Obesity Sister   . Diabetes  Sister   . Hypertension Sister   . Heart disease Maternal Grandmother        MI  . Cancer Maternal Grandfather        lung, smoker  . Diabetes Maternal Grandfather   . Diabetes Paternal Grandmother   . Heart disease Paternal Grandmother   . Pancreatitis Sister   . Esophageal cancer Neg Hx   . Stomach cancer Neg Hx   . Stroke Neg Hx   . Colon polyps Neg Hx   . Rectal cancer Neg Hx   . Pancreatic cancer Neg Hx     ROS: Review of Systems  Constitutional: Negative for weight loss.  Cardiovascular: Negative for chest pain.       Negative chest pressure  Neurological: Negative for headaches.  Psychiatric/Behavioral:       + Anxiety    PHYSICAL EXAM: Blood pressure (!) 157/80, pulse 83, temperature 97.8 F (36.6 C), temperature source Oral, height 5\' 5"  (1.651 m), weight 208 lb (94.3 kg), SpO2 96 %. Body mass index is 34.61 kg/m. Physical Exam Vitals signs reviewed.  Constitutional:      Appearance: Normal appearance. She is obese.  Cardiovascular:     Rate and Rhythm: Normal rate.     Pulses: Normal pulses.  Pulmonary:     Effort: Pulmonary effort is normal.     Breath sounds: Normal breath sounds.  Musculoskeletal: Normal range of motion.  Skin:    General: Skin is warm and dry.  Neurological:     Mental Status: She is alert and oriented to person, place, and time.  Psychiatric:        Mood and Affect:  Mood normal.        Behavior: Behavior normal.     RECENT LABS AND TESTS: BMET    Component Value Date/Time   NA 140 12/30/2018 1004   K 4.4 12/30/2018 1004   CL 103 12/30/2018 1004   CO2 21 12/30/2018 1004   GLUCOSE 82 12/30/2018 1004   GLUCOSE 85 11/22/2016 1521   GLUCOSE 86 11/13/2009   BUN 18 12/30/2018 1004   CREATININE 0.58 12/30/2018 1004   CREATININE 0.67 11/22/2016 1521   CALCIUM 9.0 12/30/2018 1004   GFRNONAA 106 12/30/2018 1004   GFRAA 122 12/30/2018 1004   Lab Results  Component Value Date   HGBA1C 5.4 06/16/2019   HGBA1C 5.4 12/30/2018   HGBA1C 5.4 09/21/2018   HGBA1C 5.4 04/30/2018   HGBA1C 5.2 10/06/2017   Lab Results  Component Value Date   INSULIN 9.3 12/30/2018   INSULIN 11.1 09/21/2018   INSULIN 8.7 04/30/2018   INSULIN 8.8 10/06/2017   CBC    Component Value Date/Time   WBC 9.6 10/06/2017 0850   WBC 9.8 11/22/2016 1521   RBC 4.71 10/06/2017 0850   RBC 4.41 11/22/2016 1521   HGB 13.8 10/06/2017 0850   HCT 42.4 10/06/2017 0850   PLT 328 11/22/2016 1521   PLT 381 11/13/2009   MCV 90 10/06/2017 0850   MCH 29.3 10/06/2017 0850   MCH 29.7 11/22/2016 1521   MCHC 32.5 10/06/2017 0850   MCHC 32.2 11/22/2016 1521   RDW 13.5 10/06/2017 0850   LYMPHSABS 1.8 10/06/2017 0850   MONOABS 0.5 05/26/2015 0931   EOSABS 0.1 10/06/2017 0850   BASOSABS 0.1 10/06/2017 0850   Iron/TIBC/Ferritin/ %Sat    Component Value Date/Time   FERRITIN 50 11/09/2008   Lipid Panel     Component Value Date/Time   CHOL 162 12/30/2018 1004  TRIG 109 12/30/2018 1004   TRIG 138 11/13/2009 1337   HDL 51 12/30/2018 1004   CHOLHDL 3.3 04/30/2018 1042   CHOLHDL 3.9 11/22/2016 1521   VLDL 32 (H) 11/22/2016 1521   LDLCALC 89 12/30/2018 1004   Hepatic Function Panel     Component Value Date/Time   PROT 6.7 12/30/2018 1004   ALBUMIN 4.2 12/30/2018 1004   AST 14 12/30/2018 1004   ALT 20 12/30/2018 1004   ALKPHOS 144 (H) 12/30/2018 1004   BILITOT 0.2 12/30/2018  1004   BILIDIR 0.1 05/26/2015 0931      Component Value Date/Time   TSH 2.360 06/16/2019 0807   TSH 1.690 10/06/2017 0850   TSH 1.30 11/22/2016 1521      OBESITY BEHAVIORAL INTERVENTION VISIT  Today's visit was # 38   Starting weight: 212 lbs Starting date: 10/06/17 Today's weight : 208 lbs Today's date: 09/27/2019 Total lbs lost to date: 4    ASK: We discussed the diagnosis of obesity with Hailey Noble today and Hailey Noble agreed to give [name] permission to discuss obesity behavioral modification therapy today.  ASSESS: Hailey Noble has the diagnosis of obesity and her BMI today is 34.61 Hailey Noble is in the action stage of change   ADVISE: Hailey Noble was educated on the multiple health risks of obesity as well as the benefit of weight loss to improve her health. She was advised of the need for long term treatment and the importance of lifestyle modifications to improve her current health and to decrease her risk of future health problems.  AGREE: Multiple dietary modification options and treatment options were discussed and  Hailey Noble agreed to follow the recommendations documented in the above note.  ARRANGE: Hailey Noble was educated on the importance of frequent visits to treat obesity as outlined per CMS and USPSTF guidelines and agreed to schedule her next follow up appointment today.  I, Trixie Dredge, am acting as transcriptionist for Ilene Qua, MD  I have reviewed the above documentation for accuracy and completeness, and I agree with the above. - Ilene Qua, MD

## 2019-10-07 NOTE — Patient Instructions (Addendum)
YOUR COVID TEST IS DONE PLEASE FOLLOW ALL THE QUARANTINE  INSTRUCTIONS GIVEN IN YOUR HANDOUT.      Your procedure is scheduled on 10-12-2019  Report to [facility] AT  6:00 A. M.   Call this number if you have problems the morning of surgery  :[phone].   OUR ADDRESS IS [place].  WE ARE LOCATED IN [facility].                                     REMEMBER:  DO NOT EAT FOOD OR DRINK LIQUIDS AFTER MIDNIGHT .  YOU MAY  BRUSH YOUR TEETH MORNING OF SURGERY AND RINSE YOUR MOUTH OUT, NO CHEWING GUM CANDY OR MINTS.   TAKE THESE MEDICATIONS MORNING OF SURGERY WITH A SIP OF WATER:  __TYLENOL IF NEEDED, NASAL SPRAY IF NEEDED________________________________  YOU ARE SPENDING THE NIGHT AFTER SURGERY, PLEASE BRING ALL YOUR PRESCRIPTION MEDICATIONS IN THEIR ORIGINAL BOTTLES. 1 VISITOR IS ALLOWED IN WAITING ROOM ONLY DAY OF SURGERY. NO VISITOR MAY SPEND THE NIGHT. VISITOR ARE ALLOWED TO STAY UNTIL 8:00 PM.                                    DO NOT WEAR JEWERLY, MAKE UP, OR NAIL POLISH ON FINGERNAILS. DO NOT WEAR LOTIONS, POWDERS, PERFUMES OR DEODORANT. DO NOT SHAVE FOR 24 HOURS PRIOR TO DAY OF SURGERY.  CONTACTS, GLASSES, OR DENTURES MAY NOT BE WORN TO SURGERY.                                    [facility] IS NOT RESPONSIBLE  FOR ANY BELONGINGS.                                                                    Marland Kitchen                                                                                                    [facility] - Preparing for Surgery Before surgery, you can play an important role.  Because skin is not sterile, your skin needs to be as free of germs as possible.  You can reduce the number of germs on your skin by washing with CHG (chlorahexidine gluconate) soap before surgery.  CHG is an antiseptic cleaner which kills germs and bonds with the skin to continue killing germs even after washing. Please DO NOT use if you have an allergy to CHG  or antibacterial soaps.  If your skin becomes reddened/irritated stop using the CHG and inform your nurse when you arrive at [facility]. Do not shave (including legs and underarms) for at least 48 hours prior to the first CHG shower.  You may shave your face/neck. Please follow these instructions carefully:  1.  Shower with CHG Soap the night before surgery and the  morning of Surgery.  2.  If you choose to wash your hair, wash your hair first as usual with your  normal  shampoo.  3.  After you shampoo, rinse your hair and body thoroughly to remove the  shampoo.                           4.  Use CHG as you would any other liquid soap.  You can apply chg directly  to the skin and wash                       Gently with a scrungie or clean washcloth.  5.  Apply the CHG Soap to your body ONLY FROM THE NECK DOWN.   Do not use on face/ open                           Wound or open sores. Avoid contact with eyes, ears mouth and genitals (private parts).                       Wash face,  Genitals (private parts) with your normal soap.             6.  Wash thoroughly, paying special attention to the area where your surgery  will be performed.  7.  Thoroughly rinse your body with warm water from the neck down.  8.  DO NOT shower/wash with your normal soap after using and rinsing off  the CHG Soap.                9.  Pat yourself dry with a clean towel.            10.  Wear clean pajamas.            11.  Place clean sheets on your bed the night of your first shower and do not  sleep with pets. Day of Surgery : Do not apply any lotions/deodorants the morning of surgery.  Please wear clean clothes to the hospital/surgery center.  FAILURE TO FOLLOW THESE INSTRUCTIONS MAY RESULT IN THE CANCELLATION OF YOUR SURGERY PATIENT SIGNATURE_________________________________  NURSE SIGNATURE__________________________________  ________________________________________________________________________   Adam Phenix  An incentive spirometer is a tool that can help keep your lungs clear and active. This tool measures how well you are filling your lungs with each breath. Taking long deep breaths may help reverse or decrease the chance of developing breathing (pulmonary) problems (especially infection) following:  A long period of time when you are unable to move or be active. BEFORE THE PROCEDURE   If the spirometer includes an indicator to show your best effort, your nurse or respiratory therapist will set it to a desired goal.  If possible, sit up straight or lean slightly forward. Try not to slouch.  Hold the incentive spirometer in an upright position. INSTRUCTIONS FOR USE  1. Sit on the edge of your bed if possible, or sit up as far as you can in bed or on a chair. 2. Hold the incentive spirometer in an upright position. 3. Breathe out normally. 4. Place the mouthpiece  in your mouth and seal your lips tightly around it. 5. Breathe in slowly and as deeply as possible, raising the piston or the ball toward the top of the column. 6. Hold your breath for 3-5 seconds or for as long as possible. Allow the piston or ball to fall to the bottom of the column. 7. Remove the mouthpiece from your mouth and breathe out normally. 8. Rest for a few seconds and repeat Steps 1 through 7 at least 10 times every 1-2 hours when you are awake. Take your time and take a few normal breaths between deep breaths. 9. The spirometer may include an indicator to show your best effort. Use the indicator as a goal to work toward during each repetition. 10. After each set of 10 deep breaths, practice coughing to be sure your lungs are clear. If you have an incision (the cut made at the time of surgery), support your incision when coughing by placing a pillow or rolled up towels firmly against it. Once you are able to get out of bed, walk around indoors and cough well. You may stop using the incentive spirometer when  instructed by your caregiver.  RISKS AND COMPLICATIONS  Take your time so you do not get dizzy or light-headed.  If you are in pain, you may need to take or ask for pain medication before doing incentive spirometry. It is harder to take a deep breath if you are having pain. AFTER USE  Rest and breathe slowly and easily.  It can be helpful to keep track of a log of your progress. Your caregiver can provide you with a simple table to help with this. If you are using the spirometer at home, follow these instructions: [place] IF:   You are having difficultly using the spirometer.  You have trouble using the spirometer as often as instructed.  Your pain medication is not giving enough relief while using the spirometer.  You develop fever of 100.5 F (38.1 C) or higher. SEEK IMMEDIATE MEDICAL CARE IF:   You cough up bloody sputum that had not been present before.  You develop fever of 102 F (38.9 C) or greater.  You develop worsening pain at or near the incision site. MAKE SURE YOU:   Understand these instructions.  Will watch your condition.  Will get help right away if you are not doing well or get worse. Document Released: 04/21/2007 Document Revised: 03/02/2012 Document Reviewed: 06/22/2007 [facility] Patient Information 2014 [place], [place].   ________________________________________________________________________

## 2019-10-08 ENCOUNTER — Encounter (HOSPITAL_COMMUNITY): Payer: Self-pay

## 2019-10-08 ENCOUNTER — Encounter (HOSPITAL_COMMUNITY)
Admission: RE | Admit: 2019-10-08 | Discharge: 2019-10-08 | Disposition: A | Discharge status: Home / Self Care | Payer: 59 | Source: Ambulatory Visit | Attending: Obstetrics and Gynecology | Admitting: Obstetrics and Gynecology

## 2019-10-08 ENCOUNTER — Other Ambulatory Visit: Payer: Self-pay

## 2019-10-08 ENCOUNTER — Other Ambulatory Visit (HOSPITAL_COMMUNITY)
Admission: RE | Admit: 2019-10-08 | Discharge: 2019-10-08 | Disposition: A | Discharge status: Home / Self Care | Payer: 59 | Source: Ambulatory Visit | Attending: Obstetrics and Gynecology | Admitting: Obstetrics and Gynecology

## 2019-10-08 DIAGNOSIS — Z01812 Encounter for preprocedural laboratory examination: Secondary | ICD-10-CM | POA: Diagnosis not present

## 2019-10-08 DIAGNOSIS — Z20828 Contact with and (suspected) exposure to other viral communicable diseases: Secondary | ICD-10-CM | POA: Insufficient documentation

## 2019-10-08 HISTORY — DX: Other specified postprocedural states: Z98.890

## 2019-10-08 HISTORY — DX: Insulin resistance, unspecified: E88.819

## 2019-10-08 HISTORY — DX: Metabolic syndrome: E88.81

## 2019-10-08 HISTORY — DX: Benign neoplasm of connective and other soft tissue, unspecified: D21.9

## 2019-10-08 HISTORY — DX: Nausea with vomiting, unspecified: R11.2

## 2019-10-08 HISTORY — DX: Nausea with vomiting, unspecified: Z98.890

## 2019-10-08 LAB — BASIC METABOLIC PANEL
Anion gap: 8 (ref 5–15)
BUN: 21 mg/dL — ABNORMAL HIGH (ref 6–20)
CO2: 24 mmol/L (ref 22–32)
Calcium: 9.1 mg/dL (ref 8.9–10.3)
Chloride: 106 mmol/L (ref 98–111)
Creatinine, Ser: 0.59 mg/dL (ref 0.44–1.00)
GFR calc Af Amer: >60 mL/min (ref >60)
GFR calc non Af Amer: >60 mL/min (ref >60)
Glucose, Bld: 87 mg/dL (ref 70–99)
Potassium: 4.1 mmol/L (ref 3.5–5.1)
Sodium: 138 mmol/L (ref 135–145)

## 2019-10-08 LAB — CBC
HCT: 41.9 % (ref 36.0–46.0)
Hemoglobin: 13.5 g/dL (ref 12.0–15.0)
MCH: 29.5 pg (ref 26.0–34.0)
MCHC: 32.2 g/dL (ref 30.0–36.0)
MCV: 91.5 fL (ref 80.0–100.0)
Platelets: 321 10*3/uL (ref 150–400)
RBC: 4.58 MIL/uL (ref 3.87–5.11)
RDW: 12.4 % (ref 11.5–15.5)
WBC: 7.5 10*3/uL (ref 4.0–10.5)
nRBC: 0 % (ref 0.0–0.2)

## 2019-10-08 LAB — HEMOGLOBIN A1C
Hgb A1c MFr Bld: 5.3 % (ref 4.8–5.6)
Mean Plasma Glucose: 105.41 mg/dL

## 2019-10-08 LAB — GLUCOSE, CAPILLARY: Glucose-Capillary: 94 mg/dL (ref 70–99)

## 2019-10-08 LAB — ABO/RH: ABO/RH(D): O NEG

## 2019-10-08 NOTE — Progress Notes (Signed)
PCP - Mosie Lukes, MD Cardiologist -   Chest x-ray -  EKG -  Stress Test -  ECHO -  Cardiac Cath -   Sleep Study -  CPAP -   Fasting Blood Sugar -  Checks Blood Sugar _____ times a day  Blood Thinner Instructions: Aspirin Instructions: Last Dose:  Anesthesia review:   N/a  Patient denies shortness of breath, fever, cough and chest pain at PAT appointment   Patient verbalized understanding of instructions that were given to them at the PAT appointment. Patient was also instructed that they will need to review over the PAT instructions again at home before surgery.

## 2019-10-09 LAB — NOVEL CORONAVIRUS, NAA (HOSP ORDER, SEND-OUT TO REF LAB; TAT 18-24 HRS): SARS-CoV-2, NAA: NOT DETECTED

## 2019-10-12 ENCOUNTER — Encounter (HOSPITAL_BASED_OUTPATIENT_CLINIC_OR_DEPARTMENT_OTHER)
Admission: RE | Disposition: A | Discharge status: Home / Self Care | Payer: Self-pay | Source: Home / Self Care | Attending: Obstetrics and Gynecology

## 2019-10-12 ENCOUNTER — Ambulatory Visit (HOSPITAL_BASED_OUTPATIENT_CLINIC_OR_DEPARTMENT_OTHER)
Admission: RE | Admit: 2019-10-12 | Discharge: 2019-10-13 | Disposition: A | Discharge status: Home / Self Care | Payer: 59 | Attending: Obstetrics and Gynecology | Admitting: Obstetrics and Gynecology

## 2019-10-12 ENCOUNTER — Ambulatory Visit (HOSPITAL_BASED_OUTPATIENT_CLINIC_OR_DEPARTMENT_OTHER): Payer: 59 | Admitting: Anesthesiology

## 2019-10-12 ENCOUNTER — Ambulatory Visit (HOSPITAL_BASED_OUTPATIENT_CLINIC_OR_DEPARTMENT_OTHER): Payer: 59 | Admitting: Physician Assistant

## 2019-10-12 ENCOUNTER — Encounter (HOSPITAL_BASED_OUTPATIENT_CLINIC_OR_DEPARTMENT_OTHER): Payer: Self-pay

## 2019-10-12 DIAGNOSIS — G8929 Other chronic pain: Secondary | ICD-10-CM | POA: Diagnosis not present

## 2019-10-12 DIAGNOSIS — Z888 Allergy status to other drugs, medicaments and biological substances status: Secondary | ICD-10-CM | POA: Diagnosis not present

## 2019-10-12 DIAGNOSIS — Z803 Family history of malignant neoplasm of breast: Secondary | ICD-10-CM | POA: Diagnosis not present

## 2019-10-12 DIAGNOSIS — N803 Endometriosis of pelvic peritoneum: Secondary | ICD-10-CM | POA: Insufficient documentation

## 2019-10-12 DIAGNOSIS — D250 Submucous leiomyoma of uterus: Secondary | ICD-10-CM | POA: Diagnosis not present

## 2019-10-12 DIAGNOSIS — D251 Intramural leiomyoma of uterus: Secondary | ICD-10-CM | POA: Diagnosis not present

## 2019-10-12 DIAGNOSIS — R102 Pelvic and perineal pain unspecified side: Secondary | ICD-10-CM | POA: Diagnosis present

## 2019-10-12 DIAGNOSIS — E8881 Metabolic syndrome: Secondary | ICD-10-CM | POA: Insufficient documentation

## 2019-10-12 DIAGNOSIS — Z8249 Family history of ischemic heart disease and other diseases of the circulatory system: Secondary | ICD-10-CM | POA: Diagnosis not present

## 2019-10-12 DIAGNOSIS — Z800 Family history of malignant neoplasm of digestive organs: Secondary | ICD-10-CM | POA: Diagnosis not present

## 2019-10-12 DIAGNOSIS — Z79899 Other long term (current) drug therapy: Secondary | ICD-10-CM | POA: Diagnosis not present

## 2019-10-12 DIAGNOSIS — N888 Other specified noninflammatory disorders of cervix uteri: Secondary | ICD-10-CM | POA: Insufficient documentation

## 2019-10-12 DIAGNOSIS — N809 Endometriosis, unspecified: Secondary | ICD-10-CM | POA: Diagnosis not present

## 2019-10-12 DIAGNOSIS — Z7984 Long term (current) use of oral hypoglycemic drugs: Secondary | ICD-10-CM | POA: Diagnosis not present

## 2019-10-12 DIAGNOSIS — N840 Polyp of corpus uteri: Secondary | ICD-10-CM | POA: Diagnosis not present

## 2019-10-12 DIAGNOSIS — E559 Vitamin D deficiency, unspecified: Secondary | ICD-10-CM | POA: Diagnosis not present

## 2019-10-12 DIAGNOSIS — Z801 Family history of malignant neoplasm of trachea, bronchus and lung: Secondary | ICD-10-CM | POA: Insufficient documentation

## 2019-10-12 DIAGNOSIS — Z7951 Long term (current) use of inhaled steroids: Secondary | ICD-10-CM | POA: Insufficient documentation

## 2019-10-12 DIAGNOSIS — D259 Leiomyoma of uterus, unspecified: Secondary | ICD-10-CM | POA: Diagnosis not present

## 2019-10-12 DIAGNOSIS — Z833 Family history of diabetes mellitus: Secondary | ICD-10-CM | POA: Insufficient documentation

## 2019-10-12 DIAGNOSIS — K660 Peritoneal adhesions (postprocedural) (postinfection): Secondary | ICD-10-CM | POA: Insufficient documentation

## 2019-10-12 DIAGNOSIS — F419 Anxiety disorder, unspecified: Secondary | ICD-10-CM | POA: Insufficient documentation

## 2019-10-12 DIAGNOSIS — Z8379 Family history of other diseases of the digestive system: Secondary | ICD-10-CM | POA: Insufficient documentation

## 2019-10-12 DIAGNOSIS — N72 Inflammatory disease of cervix uteri: Secondary | ICD-10-CM | POA: Insufficient documentation

## 2019-10-12 DIAGNOSIS — Z791 Long term (current) use of non-steroidal anti-inflammatories (NSAID): Secondary | ICD-10-CM | POA: Diagnosis not present

## 2019-10-12 DIAGNOSIS — K219 Gastro-esophageal reflux disease without esophagitis: Secondary | ICD-10-CM | POA: Diagnosis not present

## 2019-10-12 DIAGNOSIS — Z9889 Other specified postprocedural states: Secondary | ICD-10-CM

## 2019-10-12 DIAGNOSIS — N736 Female pelvic peritoneal adhesions (postinfective): Secondary | ICD-10-CM | POA: Diagnosis not present

## 2019-10-12 DIAGNOSIS — Z885 Allergy status to narcotic agent status: Secondary | ICD-10-CM | POA: Insufficient documentation

## 2019-10-12 DIAGNOSIS — Z91018 Allergy to other foods: Secondary | ICD-10-CM | POA: Insufficient documentation

## 2019-10-12 DIAGNOSIS — Z8349 Family history of other endocrine, nutritional and metabolic diseases: Secondary | ICD-10-CM | POA: Insufficient documentation

## 2019-10-12 DIAGNOSIS — E785 Hyperlipidemia, unspecified: Secondary | ICD-10-CM | POA: Insufficient documentation

## 2019-10-12 HISTORY — PX: TOTAL LAPAROSCOPIC HYSTERECTOMY WITH BILATERAL SALPINGO OOPHORECTOMY: SHX6845

## 2019-10-12 HISTORY — PX: CYSTOSCOPY: SHX5120

## 2019-10-12 LAB — CBC
HCT: 45.1 % (ref 36.0–46.0)
Hemoglobin: 13.9 g/dL (ref 12.0–15.0)
MCH: 28.9 pg (ref 26.0–34.0)
MCHC: 30.8 g/dL (ref 30.0–36.0)
MCV: 93.8 fL (ref 80.0–100.0)
Platelets: 289 10*3/uL (ref 150–400)
RBC: 4.81 MIL/uL (ref 3.87–5.11)
RDW: 12.3 % (ref 11.5–15.5)
WBC: 13.9 10*3/uL — ABNORMAL HIGH (ref 4.0–10.5)
nRBC: 0 % (ref 0.0–0.2)

## 2019-10-12 LAB — TYPE AND SCREEN
ABO/RH(D): O NEG
Antibody Screen: NEGATIVE

## 2019-10-12 LAB — POCT PREGNANCY, URINE: Preg Test, Ur: NEGATIVE

## 2019-10-12 SURGERY — HYSTERECTOMY, TOTAL, LAPAROSCOPIC, WITH BILATERAL SALPINGO-OOPHORECTOMY
Anesthesia: General | Site: Urethra

## 2019-10-12 MED ORDER — ACETAMINOPHEN 500 MG PO TABS
ORAL_TABLET | ORAL | Status: AC
  Filled 2019-10-12: qty 2

## 2019-10-12 MED ORDER — SODIUM CHLORIDE 0.9 % IV SOLN
2.0000 g | INTRAVENOUS | Status: AC
  Administered 2019-10-12: 2 g via INTRAVENOUS
  Filled 2019-10-12: qty 2

## 2019-10-12 MED ORDER — ONDANSETRON HCL 4 MG/2ML IJ SOLN
4.0000 mg | Freq: Four times a day (QID) | INTRAMUSCULAR | Status: DC | PRN
  Filled 2019-10-12: qty 2

## 2019-10-12 MED ORDER — MIDAZOLAM HCL 2 MG/2ML IJ SOLN
INTRAMUSCULAR | Status: DC | PRN
  Administered 2019-10-12: 2 mg via INTRAVENOUS

## 2019-10-12 MED ORDER — FLUORESCEIN SODIUM 10 % IV SOLN
INTRAVENOUS | Status: AC
  Filled 2019-10-12: qty 5

## 2019-10-12 MED ORDER — DEXAMETHASONE SODIUM PHOSPHATE 10 MG/ML IJ SOLN
INTRAMUSCULAR | Status: DC | PRN
  Administered 2019-10-12: 10 mg via INTRAVENOUS

## 2019-10-12 MED ORDER — DEXAMETHASONE SODIUM PHOSPHATE 10 MG/ML IJ SOLN
INTRAMUSCULAR | Status: AC
  Filled 2019-10-12: qty 1

## 2019-10-12 MED ORDER — SENNOSIDES-DOCUSATE SODIUM 8.6-50 MG PO TABS
1.0000 | ORAL_TABLET | Freq: Every evening | ORAL | Status: DC | PRN
  Filled 2019-10-12: qty 1

## 2019-10-12 MED ORDER — PROPOFOL 500 MG/50ML IV EMUL
INTRAVENOUS | Status: DC | PRN
  Administered 2019-10-12: 150 ug/kg/min via INTRAVENOUS

## 2019-10-12 MED ORDER — FLUORESCEIN SODIUM 10 % IV SOLN
INTRAVENOUS | Status: DC | PRN
  Administered 2019-10-12: 200 mg via INTRAVENOUS

## 2019-10-12 MED ORDER — SUGAMMADEX SODIUM 200 MG/2ML IV SOLN
INTRAVENOUS | Status: DC | PRN
  Administered 2019-10-12: 200 mg via INTRAVENOUS

## 2019-10-12 MED ORDER — BUPIVACAINE HCL (PF) 0.25 % IJ SOLN
INTRAMUSCULAR | Status: DC | PRN
  Administered 2019-10-12: 15 mL

## 2019-10-12 MED ORDER — ROCURONIUM BROMIDE 10 MG/ML (PF) SYRINGE
PREFILLED_SYRINGE | INTRAVENOUS | Status: DC | PRN
  Administered 2019-10-12: 90 mg via INTRAVENOUS
  Administered 2019-10-12: 10 mg via INTRAVENOUS

## 2019-10-12 MED ORDER — PHENYLEPHRINE HCL (PRESSORS) 10 MG/ML IV SOLN
INTRAVENOUS | Status: DC | PRN
  Administered 2019-10-12: 80 ug via INTRAVENOUS
  Administered 2019-10-12: 120 ug via INTRAVENOUS

## 2019-10-12 MED ORDER — GABAPENTIN 100 MG PO CAPS
100.0000 mg | ORAL_CAPSULE | Freq: Two times a day (BID) | ORAL | Status: DC
  Administered 2019-10-12: 100 mg via ORAL
  Filled 2019-10-12 (×2): qty 1

## 2019-10-12 MED ORDER — ALUM & MAG HYDROXIDE-SIMETH 200-200-20 MG/5ML PO SUSP
30.0000 mL | ORAL | Status: DC | PRN
  Filled 2019-10-12: qty 30

## 2019-10-12 MED ORDER — ONDANSETRON HCL 4 MG PO TABS
4.0000 mg | ORAL_TABLET | Freq: Four times a day (QID) | ORAL | Status: DC | PRN
  Filled 2019-10-12: qty 1

## 2019-10-12 MED ORDER — MIDAZOLAM HCL 2 MG/2ML IJ SOLN
INTRAMUSCULAR | Status: AC
  Filled 2019-10-12: qty 2

## 2019-10-12 MED ORDER — IBUPROFEN 800 MG PO TABS
800.0000 mg | ORAL_TABLET | Freq: Three times a day (TID) | ORAL | Status: DC
  Administered 2019-10-12 – 2019-10-13 (×3): 800 mg via ORAL
  Filled 2019-10-12: qty 1

## 2019-10-12 MED ORDER — ACETAMINOPHEN 500 MG PO TABS
1000.0000 mg | ORAL_TABLET | Freq: Four times a day (QID) | ORAL | Status: DC
  Administered 2019-10-12 – 2019-10-13 (×4): 1000 mg via ORAL
  Filled 2019-10-12: qty 2

## 2019-10-12 MED ORDER — SODIUM CHLORIDE 0.9 % IV SOLN
INTRAVENOUS | Status: AC
  Filled 2019-10-12: qty 2

## 2019-10-12 MED ORDER — IBUPROFEN 200 MG PO TABS
ORAL_TABLET | ORAL | Status: AC
  Filled 2019-10-12: qty 4

## 2019-10-12 MED ORDER — PHENYLEPHRINE 40 MCG/ML (10ML) SYRINGE FOR IV PUSH (FOR BLOOD PRESSURE SUPPORT)
PREFILLED_SYRINGE | INTRAVENOUS | Status: AC
  Filled 2019-10-12: qty 10

## 2019-10-12 MED ORDER — PROMETHAZINE HCL 25 MG PO TABS
ORAL_TABLET | ORAL | Status: AC
  Filled 2019-10-12: qty 1

## 2019-10-12 MED ORDER — FENTANYL CITRATE (PF) 100 MCG/2ML IJ SOLN
25.0000 ug | INTRAMUSCULAR | Status: DC | PRN
  Filled 2019-10-12: qty 1

## 2019-10-12 MED ORDER — LACTATED RINGERS IV SOLN
INTRAVENOUS | Status: DC
  Administered 2019-10-12 (×2): via INTRAVENOUS
  Filled 2019-10-12: qty 1000

## 2019-10-12 MED ORDER — METFORMIN HCL 500 MG PO TABS
500.0000 mg | ORAL_TABLET | Freq: Every day | ORAL | Status: DC
  Filled 2019-10-12: qty 1

## 2019-10-12 MED ORDER — ESCITALOPRAM OXALATE 5 MG PO TABS
15.0000 mg | ORAL_TABLET | Freq: Every day | ORAL | Status: DC
  Filled 2019-10-12: qty 1

## 2019-10-12 MED ORDER — ONDANSETRON HCL 4 MG/2ML IJ SOLN
INTRAMUSCULAR | Status: AC
  Filled 2019-10-12: qty 2

## 2019-10-12 MED ORDER — GABAPENTIN 300 MG PO CAPS
300.0000 mg | ORAL_CAPSULE | ORAL | Status: AC
  Administered 2019-10-12: 300 mg via ORAL
  Filled 2019-10-12: qty 1

## 2019-10-12 MED ORDER — FENTANYL CITRATE (PF) 250 MCG/5ML IJ SOLN
INTRAMUSCULAR | Status: AC
  Filled 2019-10-12: qty 5

## 2019-10-12 MED ORDER — GABAPENTIN 300 MG PO CAPS
ORAL_CAPSULE | ORAL | Status: AC
  Filled 2019-10-12: qty 1

## 2019-10-12 MED ORDER — ONDANSETRON HCL 4 MG/2ML IJ SOLN
INTRAMUSCULAR | Status: DC | PRN
  Administered 2019-10-12: 4 mg via INTRAVENOUS

## 2019-10-12 MED ORDER — KETOROLAC TROMETHAMINE 30 MG/ML IJ SOLN
INTRAMUSCULAR | Status: AC
  Filled 2019-10-12: qty 1

## 2019-10-12 MED ORDER — PANTOPRAZOLE SODIUM 40 MG PO TBEC
DELAYED_RELEASE_TABLET | ORAL | Status: AC
  Filled 2019-10-12: qty 1

## 2019-10-12 MED ORDER — LIDOCAINE 2% (20 MG/ML) 5 ML SYRINGE
INTRAMUSCULAR | Status: AC
  Filled 2019-10-12: qty 5

## 2019-10-12 MED ORDER — PROPOFOL 10 MG/ML IV BOLUS
INTRAVENOUS | Status: DC | PRN
  Administered 2019-10-12: 150 mg via INTRAVENOUS

## 2019-10-12 MED ORDER — ROCURONIUM BROMIDE 10 MG/ML (PF) SYRINGE
PREFILLED_SYRINGE | INTRAVENOUS | Status: AC
  Filled 2019-10-12: qty 10

## 2019-10-12 MED ORDER — PROPOFOL 10 MG/ML IV BOLUS
INTRAVENOUS | Status: AC
  Filled 2019-10-12: qty 40

## 2019-10-12 MED ORDER — KETOROLAC TROMETHAMINE 30 MG/ML IJ SOLN
30.0000 mg | Freq: Once | INTRAMUSCULAR | Status: AC
  Administered 2019-10-12: 30 mg via INTRAVENOUS
  Filled 2019-10-12: qty 1

## 2019-10-12 MED ORDER — SODIUM CHLORIDE 0.9 % IR SOLN
Status: DC | PRN
  Administered 2019-10-12: 1000 mL via INTRAVESICAL

## 2019-10-12 MED ORDER — ACETAMINOPHEN 500 MG PO TABS
1000.0000 mg | ORAL_TABLET | Freq: Once | ORAL | Status: AC
  Administered 2019-10-12: 1000 mg via ORAL
  Filled 2019-10-12: qty 2

## 2019-10-12 MED ORDER — MENTHOL 3 MG MT LOZG
1.0000 | LOZENGE | OROMUCOSAL | Status: DC | PRN
  Filled 2019-10-12: qty 9

## 2019-10-12 MED ORDER — SCOPOLAMINE 1 MG/3DAYS TD PT72
MEDICATED_PATCH | TRANSDERMAL | Status: AC
  Filled 2019-10-12: qty 1

## 2019-10-12 MED ORDER — FENTANYL CITRATE (PF) 100 MCG/2ML IJ SOLN
INTRAMUSCULAR | Status: DC | PRN
  Administered 2019-10-12: 100 ug via INTRAVENOUS
  Administered 2019-10-12 (×4): 50 ug via INTRAVENOUS

## 2019-10-12 MED ORDER — FENTANYL CITRATE (PF) 100 MCG/2ML IJ SOLN
INTRAMUSCULAR | Status: AC
  Filled 2019-10-12: qty 2

## 2019-10-12 MED ORDER — PROMETHAZINE HCL 25 MG PO TABS
25.0000 mg | ORAL_TABLET | Freq: Two times a day (BID) | ORAL | Status: DC
  Administered 2019-10-12: 25 mg via ORAL
  Filled 2019-10-12: qty 1

## 2019-10-12 MED ORDER — SCOPOLAMINE 1 MG/3DAYS TD PT72
1.0000 | MEDICATED_PATCH | TRANSDERMAL | Status: DC
  Administered 2019-10-12: 1 via TRANSDERMAL
  Filled 2019-10-12: qty 1

## 2019-10-12 MED ORDER — LIDOCAINE 2% (20 MG/ML) 5 ML SYRINGE
INTRAMUSCULAR | Status: DC | PRN
  Administered 2019-10-12: 60 mg via INTRAVENOUS

## 2019-10-12 MED ORDER — ARTIFICIAL TEARS OPHTHALMIC OINT
TOPICAL_OINTMENT | OPHTHALMIC | Status: AC
  Filled 2019-10-12: qty 3.5

## 2019-10-12 MED ORDER — PANTOPRAZOLE SODIUM 40 MG PO TBEC
40.0000 mg | DELAYED_RELEASE_TABLET | Freq: Every day | ORAL | Status: DC
  Administered 2019-10-12: 40 mg via ORAL
  Filled 2019-10-12: qty 1

## 2019-10-12 SURGICAL SUPPLY — 59 items
ADH SKN CLS APL DERMABOND .7 (GAUZE/BANDAGES/DRESSINGS) ×2
BARRIER ADHS 3X4 INTERCEED (GAUZE/BANDAGES/DRESSINGS) IMPLANT
BRR ADH 4X3 ABS CNTRL BYND (GAUZE/BANDAGES/DRESSINGS)
CABLE HIGH FREQUENCY MONO STRZ (ELECTRODE) IMPLANT
CELL SAVER LIPIGURD (MISCELLANEOUS) IMPLANT
CLOTH BEACON ORANGE TIMEOUT ST (SAFETY) ×3 IMPLANT
COVER BACK TABLE 60X90IN (DRAPES) ×3 IMPLANT
COVER MAYO STAND STRL (DRAPES) ×3 IMPLANT
COVER SURGICAL LIGHT HANDLE (MISCELLANEOUS) ×3 IMPLANT
COVER WAND RF STERILE (DRAPES) ×5 IMPLANT
DERMABOND ADVANCED (GAUZE/BANDAGES/DRESSINGS) ×1
DERMABOND ADVANCED .7 DNX12 (GAUZE/BANDAGES/DRESSINGS) ×2 IMPLANT
DEVICE RETRIEVAL ALEXIS 14 (MISCELLANEOUS) IMPLANT
DEVICE SUTURE ENDOST 10MM (ENDOMECHANICALS) ×1 IMPLANT
DURAPREP 26ML APPLICATOR (WOUND CARE) ×3 IMPLANT
EXTRT SYSTEM ALEXIS 14CM (MISCELLANEOUS)
EXTRT SYSTEM ALEXIS 17CM (MISCELLANEOUS)
FILTER SMOKE EVAC LAPAROSHD (FILTER) IMPLANT
GAUZE 4X4 16PLY RFD (DISPOSABLE) ×3 IMPLANT
GLOVE BIO SURGEON STRL SZ 6.5 (GLOVE) ×8 IMPLANT
GOWN STRL REUS W/TWL LRG LVL3 (GOWN DISPOSABLE) ×6 IMPLANT
HIBICLENS CHG 4% 4OZ BTL (MISCELLANEOUS) ×3 IMPLANT
MANIPULATOR UTERINE 7CM CLEARV (MISCELLANEOUS) IMPLANT
NS IRRIG 1000ML POUR BTL (IV SOLUTION) ×3 IMPLANT
OCCLUDER COLPOPNEUMO (BALLOONS) ×3 IMPLANT
PACK LAPAROSCOPY BASIN (CUSTOM PROCEDURE TRAY) ×3 IMPLANT
PACK TRENDGUARD 450 HYBRID PRO (MISCELLANEOUS) IMPLANT
SCISSORS LAP 5X35 DISP (ENDOMECHANICALS) IMPLANT
SET IRRIG TUBING LAPAROSCOPIC (IRRIGATION / IRRIGATOR) ×2 IMPLANT
SET IRRIG Y TYPE TUR BLADDER L (SET/KITS/TRAYS/PACK) ×1 IMPLANT
SET TRI-LUMEN FLTR TB AIRSEAL (TUBING) ×2 IMPLANT
SET TUBE SMOKE EVAC HIGH FLOW (TUBING) ×3 IMPLANT
SHEARS HARMONIC ACE PLUS 36CM (ENDOMECHANICALS) ×1 IMPLANT
SUT ENDO VLOC 180-0-8IN (SUTURE) ×1 IMPLANT
SUT PLAIN 2 0 XLH (SUTURE) IMPLANT
SUT VIC AB 0 CT1 27 (SUTURE) ×6
SUT VIC AB 0 CT1 27XBRD ANBCTR (SUTURE) ×4 IMPLANT
SUT VIC AB 2-0 CT1 (SUTURE) IMPLANT
SUT VIC AB 4-0 KS 27 (SUTURE) IMPLANT
SUT VIC AB 4-0 PS2 27 (SUTURE) ×3 IMPLANT
SUT VICRYL 0 UR6 27IN ABS (SUTURE) ×3 IMPLANT
SUT VICRYL 4-0 PS2 18IN ABS (SUTURE) ×3 IMPLANT
SYR 10ML LL (SYRINGE) ×1 IMPLANT
SYR 30ML LL (SYRINGE) ×1 IMPLANT
SYR 50ML LL SCALE MARK (SYRINGE) ×3 IMPLANT
SYSTEM CARTER THOMASON II (TROCAR) ×3 IMPLANT
SYSTEM CONTND EXTRCTN KII BLLN (MISCELLANEOUS) IMPLANT
TIP UTERINE 5.1X6CM LAV DISP (MISCELLANEOUS) IMPLANT
TIP UTERINE 6.7X10CM GRN DISP (MISCELLANEOUS) IMPLANT
TIP UTERINE 6.7X6CM WHT DISP (MISCELLANEOUS) IMPLANT
TIP UTERINE 6.7X8CM BLUE DISP (MISCELLANEOUS) IMPLANT
TOWEL OR 17X26 10 PK STRL BLUE (TOWEL DISPOSABLE) ×6 IMPLANT
TRAY FOLEY W/BAG SLVR 14FR (SET/KITS/TRAYS/PACK) ×3 IMPLANT
TRENDGUARD 450 HYBRID PRO PACK (MISCELLANEOUS) ×3
TROCAR BLADELESS OPT 5 100 (ENDOMECHANICALS) ×3 IMPLANT
TROCAR PORT AIRSEAL 5X120 (TROCAR) IMPLANT
TROCAR XCEL NON-BLD 11X100MML (ENDOMECHANICALS) ×3 IMPLANT
TROCAR XCEL NON-BLD 5MMX100MML (ENDOMECHANICALS) ×6 IMPLANT
WARMER LAPAROSCOPE (MISCELLANEOUS) ×3 IMPLANT

## 2019-10-12 NOTE — Anesthesia Procedure Notes (Signed)
Procedure Name: Intubation Date/Time: 10/12/2019 8:02 AM Performed by: Wanita Chamberlain, CRNA Pre-anesthesia Checklist: Patient identified, Emergency Drugs available, Suction available, Patient being monitored and Timeout performed Patient Re-evaluated:Patient Re-evaluated prior to induction Oxygen Delivery Method: Circle system utilized Preoxygenation: Pre-oxygenation with 100% oxygen Induction Type: IV induction Ventilation: Mask ventilation without difficulty Laryngoscope Size: Mac and 3 Grade View: Grade I Tube type: Oral Tube size: 7.0 mm Number of attempts: 1 (Grade I view with cricoid manipulation) Airway Equipment and Method: Stylet Placement Confirmation: breath sounds checked- equal and bilateral,  CO2 detector,  positive ETCO2 and ETT inserted through vocal cords under direct vision Secured at: 21 cm Tube secured with: Tape Dental Injury: Teeth and Oropharynx as per pre-operative assessment

## 2019-10-12 NOTE — H&P (Signed)
Hailey Noble is an 53 y.o.G0 female here for total laparoscopic hysterectomy with bilateral salpingo-oopherectomy and cystoscopy ( possible open). Pt has had chronic pelvic pain intermittently for several years. It occured randomly with no specific triggers however was typically worsened with menses and then resolved after menses. Pt tried an IUD in past - had to have removed via hysteroscope - stings not found. Her LMP was 11/19/2017- she thought pain would improve but it has not. She reports pain is more persistent; dull and achy She had a SIUS on 06/2019 which confirmed two small fibroids (submucosal) and endometrial polyp. She had initially desired conservative management but due to persistent pain now requests definitive treatment. She had negative emb 11/2017   Pertinent Gynecological History: Menses: post-menopausal Bleeding: n/a Contraception: none DES exposure: unknown Blood transfusions: none Sexually transmitted diseases: no past history Previous GYN Procedures: none  Last mammogram: normal Date: 11/2018 Last pap: normal Date: 12/2018 OB History: G0, P0   Menstrual History: Menarche age: 77 No LMP recorded. (Menstrual status: Perimenopausal).    Past Medical History:  Diagnosis Date  . Allergic state 12/08/2016  . Allergy   . Anxiety   . Back pain    " i just have a weak loer back"   . Fibroids   . GERD (gastroesophageal reflux disease)   . Hyperlipidemia 06/09/2017  . Insulin resistance   . Joint pain   . Knee pain, right 03/10/2017  . Plantar fasciitis   . PONV (postoperative nausea and vomiting)    just nausea   . Shortness of breath    not only if  i exert myselg   . Sinusitis   . Vitamin D deficiency 11/22/2016  . Vitamin D deficiency     Past Surgical History:  Procedure Laterality Date  . COLONOSCOPY  02-2009   with Henrene Pastor normal  . ESOPHAGOGASTRODUODENOSCOPY  2012  . GANGLION CYST EXCISION Right    right- wrist   . NASAL SEPTUM SURGERY    . NASAL SINUS  SURGERY    . TMJ ARTHROSCOPY     left    Family History  Problem Relation Age of Onset  . Hypertension Father   . Diabetes Father   . Hyperlipidemia Father   . Breast cancer Mother 25  . Cancer Mother        breast  . Thyroid disease Mother   . Sleep apnea Mother   . Colon cancer Paternal Grandfather        died at age 37  . Cancer Paternal Grandfather        GI CA  . Obesity Sister   . Diabetes Sister   . Hypertension Sister   . Heart disease Maternal Grandmother        MI  . Cancer Maternal Grandfather        lung, smoker  . Diabetes Maternal Grandfather   . Diabetes Paternal Grandmother   . Heart disease Paternal Grandmother   . Pancreatitis Sister   . Esophageal cancer Neg Hx   . Stomach cancer Neg Hx   . Stroke Neg Hx   . Colon polyps Neg Hx   . Rectal cancer Neg Hx   . Pancreatic cancer Neg Hx     Social History:  reports that she has never smoked. She has never used smokeless tobacco. She reports current alcohol use. She reports that she does not use drugs.  Allergies:  Allergies  Allergen Reactions  . Coconut Fatty Acids Hives  . Codeine   .  Mango Flavor Hives  . [name] Itching and Swelling  . Oxycodone-Acetaminophen     rash on face and head, itching  . Tussionex Pennkinetic Er [Hydrocod Polst-Cpm Polst Er]     rash on face and head, itching  . Ultram [Tramadol] Other (See Comments)    Dizzy and low blood pressure.     Medications Prior to Admission  Medication Sig Dispense Refill Last Dose  . acetaminophen (TYLENOL) 500 MG tablet Take 500 mg by mouth every 6 (six) hours as needed for moderate pain or headache.   Past Month at Unknown time  . Artificial Tear Solution (SOOTHE XP) SOLN Place 1 drop into both eyes daily as needed (dry eyes).   10/11/2019 at Unknown time  . Calcium Carbonate-Vitamin D (CALTRATE 600+D PO) Take 2 tablets by mouth daily.   10/08/2019  . Cholecalciferol (VITAMIN D) 50 MCG (2000 UT) tablet Take 4,000 Units by mouth  daily.   10/08/2019  . escitalopram (LEXAPRO) 10 MG tablet TAKE 1 TABLET BY MOUTH ONCE DAILY (Patient taking differently: Take 15 mg by mouth at bedtime. ) 90 tablet 3 10/08/2019  . ibuprofen (ADVIL) 200 MG tablet Take 200 mg by mouth every 6 (six) hours as needed for headache or moderate pain.   Past Week at Unknown time  . Krill Oil 500 MG CAPS Take 500 mg by mouth daily.   10/08/2019  . metFORMIN (GLUCOPHAGE) 500 MG tablet Take 1 tablet (500 mg total) by mouth 2 (two) times daily with a meal. (Patient taking differently: Take 500 mg by mouth daily after lunch. ) 60 tablet 0 10/09/2019  . mometasone (NASONEX) 50 MCG/ACT nasal spray Place 2 sprays into the nose daily. (Patient taking differently: Place 2 sprays into the nose daily as needed (allergies). ) 17 g 6 10/09/2019  . Multiple Vitamin (MULTIVITAMIN WITH MINERALS) TABS tablet Take 1 tablet by mouth daily.   10/08/2019  . vitamin C (ASCORBIC ACID) 500 MG tablet Take 500 mg by mouth daily.   10/08/2019    Review of Systems  Constitutional: Positive for malaise/fatigue. Negative for chills, fever and weight loss.  Eyes: Negative for blurred vision.  Respiratory: Negative for shortness of breath.   Cardiovascular: Negative for chest pain.  Gastrointestinal: Positive for abdominal pain. Negative for heartburn, nausea and vomiting.  Genitourinary: Negative for dysuria.  Musculoskeletal: Negative for myalgias.  Skin: Negative for itching and rash.  Neurological: Negative for dizziness.  Endo/Heme/Allergies: Does not bruise/bleed easily.  Psychiatric/Behavioral: Negative for depression, hallucinations, substance abuse and suicidal ideas. The patient is not nervous/anxious.     Blood pressure (!) 149/63, pulse 83, temperature 97.8 F (36.6 C), temperature source Oral, resp. rate 18, height 5\' 5"  (1.651 m), weight 96.4 kg, SpO2 96 %. Physical Exam  Constitutional: She is oriented to person, place, and time. She appears well-developed and  well-nourished.  Neck: Normal range of motion.  Cardiovascular: Intact distal pulses.  Respiratory: Effort normal.  GI: Soft.  Genitourinary:    Vagina and uterus normal.   Musculoskeletal: Normal range of motion.  Neurological: She is alert and oriented to person, place, and time.  Skin: Skin is warm and dry.  Psychiatric: She has a normal mood and affect. Her behavior is normal. Judgment and thought content normal.    Results for orders placed or performed during the hospital encounter of 10/12/19 (from the past 24 hour(s))  Pregnancy, urine POC     Status: None   Collection Time: 10/12/19  6:00 AM  Result  Value Ref Range   Preg Test, Ur NEGATIVE NEGATIVE    No results found.  Assessment/Plan: 53yo G0 female here for total laparoscopic hysterectomy with bilateral salpingo-oopherectomy and cystoscopy ( possible open).  NPO per ERAS standards Ancef pre-op Consent reviewed after procedure detailed To OR when ready  Konni Kesinger W Art Levan 10/12/2019, 7:50 AM

## 2019-10-12 NOTE — Anesthesia Postprocedure Evaluation (Signed)
Anesthesia Post Note  Patient: Hailey Noble  Procedure(s) Performed: TOTAL LAPAROSCOPIC HYSTERECTOMY WITH BILATERAL SALPINGO OOPHORECTOMY, Lysis Pelvic Adhseions  (Bilateral Abdomen) CYSTOSCOPY (N/A Urethra)     Patient location during evaluation: PACU Anesthesia Type: General Level of consciousness: awake and alert Pain management: pain level controlled Vital Signs Assessment: post-procedure vital signs reviewed and stable Respiratory status: spontaneous breathing, nonlabored ventilation, respiratory function stable and patient connected to nasal cannula oxygen Cardiovascular status: blood pressure returned to baseline and stable Postop Assessment: no apparent nausea or vomiting Anesthetic complications: no    Last Vitals:  Vitals:   10/12/19 1244 10/12/19 1313  BP:  (!) 145/71  Pulse:  96  Resp:  16  Temp:    SpO2: 96% 92%    Last Pain:  Vitals:   10/12/19 1313  TempSrc:   PainSc: 0-No pain                 Jashanti Clinkscale L Reis [place]

## 2019-10-12 NOTE — Transfer of Care (Signed)
Immediate Anesthesia Transfer of Care Note  Patient: Hailey Noble  Procedure(s) Performed: TOTAL LAPAROSCOPIC HYSTERECTOMY WITH BILATERAL SALPINGO OOPHORECTOMY, Lysis Pelvic Adhseions  (Bilateral Abdomen) CYSTOSCOPY (N/A Urethra)  Patient Location: PACU  Anesthesia Type:General  Level of Consciousness: sedated and patient cooperative  Airway & Oxygen Therapy: Patient Spontanous Breathing and Patient connected to face mask oxygen  Post-op Assessment: Report given to RN and Post -op Vital signs reviewed and stable  Post vital signs: Reviewed and stable  Last Vitals:  Vitals Value Taken Time  BP 133/64 10/12/19 1031  Temp 37.1 C 10/12/19 1030  Pulse 95 10/12/19 1037  Resp 22 10/12/19 1037  SpO2 95 % 10/12/19 1037  Vitals shown include unvalidated device data.  Last Pain:  Vitals:   10/12/19 1030  TempSrc:   PainSc: Asleep      Patients Stated Pain Goal: 5 ([id number])  Complications: No apparent anesthesia complications

## 2019-10-12 NOTE — Progress Notes (Signed)
Patient ID: Hailey Noble, female   DOB: [birth year], 53 y.o.   MRN: [id number] POD#0 Pt doing very well. Has not needed any oral medication. Sitting upright in bed - eating dinner. Reports general abdominal soreness but tolerable and not painful. She denies any nausea or lightheadedness.  VSS GEN - NAD ABD - incisions c/d/i  A/P: POD#0 s/p TLH/BSO/cystoscopy - recovering well         Routine post op care         Likely discharge in am - may take foley out tonight or at [facility]

## 2019-10-12 NOTE — Op Note (Signed)
Operative Note    Preoperative Diagnosis: 1. Chronic pelvic pain  2. Fibroids  3. Endometrial polyp   Postoperative Diagnosis: Same 4. Endometriosis 5. Intraabdominal adhesions  Procedure: Total laparoscopic hysterectomy with bilateral salpingo-oopherectomy, lysis of adhesions, excision of endometriosis, cystoscopy   Surgeon: Mickle Mallory, DO Assist: Sharen Heck MD  Anesthesia: General  Fluids: LR 1267ml EBL: 52ml UOP: 280ml   Findings: Small uterus with small fibroids, left ovary and tube adherent to lateral sidewall and bowel, right fallopian tube and ovary normal. Endometriosis implants. Grossly normal bladder   Specimen: uterus, cervix both tubes and ovaries   Procedure Note Pt seen in pre-op. Procedure reviewed and all questions answered; consent verified  Pt taken to operating room and placed in dorsal lithotomy position with her arms safely positioned at her sides. General anesthesia was administered and found to be adequate. Pt was prepped and draped in sterile fashion and a timeout performed. A weighted speculum was placed in the posterior fornix and a sim retractor placed anteriorly. Great visualization of the cervix was obtained. Uterus was sounded to 7cm so a size 6 koh was assembled with a small cup and placed with retention sutures at 3 and 9 o'clock. A foley catheter was also placed in a sterile fashion Legs were then lowered and attention turned to her abdomen.  Here a 28mm incision was made at the umbilicus. A 57mm optiview trocar was then placed with the abdomen tented upwards. The laparoscopic camera was used to confirm placement and pneumoperitoneum obtained with CO2 gas to 47mmHg. The patient was placed in trendelenberg and gross survey of pelvis done.  At this time one more 58mm port was then placed under direct visualization in right lower quadrant and an 11 site in the left with care taken to avoid the epigastric vessels. Further exploration noted. Uterus mobile  but left fallopian tube and ovary adherent to bowel and lateral sidewalls. Endometriosis noted scattered on side wall mostly on left Both ureters were visualized along lateral sidewalls.  Starting on the the patients left, adhesions of bowel were lysed at attachment sites to tube and ovary and also adhesions to sidewall freed using blunt dissection and also cautery with harmonic. The fallopian tube and ovary were finally visualized. The left fallopian tube was then grasped with a blunt grasper, elevated and excised using the harmonic hemostatically. Next the utero-ovarian ligament and the round ligaments were sequentially grasped and excised. The broad ligament was then separated from the uterus as well with a bladder flap created. The same was performed on the other side by Dr Marvel Plan. The cardinal ligament was then excised next at the utero-cervical junction and the ovarian vessel clamped, cauterized and cut. The same was done on the patients right with similar results.   Next the bladder reflection was dissected away. The vaginal occluder was filled with 60cc of saline and starting anteriorly and working laterally the uterus and cervix were amputated off the superior vagina. The uterus, cervix and fallopian tubes were removed vaginally.  The pelvis was irrigated and hemostasis noted. The angles of the vaginal cuff were easily seen and using the endostitch with an 0 vicryl v-lock suture, the cuff was closed in a running fashion with 5-6 back stitches to ensure closure. Next endometriosis implants on the left lateral sidewall were excised. Great hemostasis noted.  A cystoscopy was performed next after florascein given . Ureteral jets were easily noted. Of note both ureters were also easily visualized intraoperatively away from site of adhesiolysis.  Further irrigation of the pelvis confirmed no abnormalities or bleeding hence patient was flattened. The 11 port site was closed with 0-vicryl suture with a  carter thomasen to ensure closure of the peritoneum.  The remaining  trocars were removed under direct visualization and gas allowed to escape.  Incision sites were closed with 4-0 vicryl suture and dermabond. Counts were noted to be correct. Patient was awakened and taken to recovery room in stable status.  Foley was left in place

## 2019-10-12 NOTE — Anesthesia Preprocedure Evaluation (Addendum)
Anesthesia Evaluation  Patient identified by MRN, date of birth, ID band Patient awake    Reviewed: Allergy & Precautions, NPO status , Patient's Chart, lab work & pertinent test results  History of Anesthesia Complications (+) PONV and history of anesthetic complications  Airway Mallampati: III  TM Distance: <3 FB Neck ROM: Full    Dental no notable dental hx. (+) Teeth Intact, Dental Advisory Given,    Pulmonary neg pulmonary ROS,    Pulmonary exam normal breath sounds clear to auscultation       Cardiovascular Normal cardiovascular exam Rhythm:Regular Rate:Normal  HLD   Neuro/Psych PSYCHIATRIC DISORDERS Anxiety Depression negative neurological ROS     GI/Hepatic Neg liver ROS, GERD  ,  Endo/Other  negative endocrine ROS  Renal/GU negative Renal ROS  negative genitourinary   Musculoskeletal negative musculoskeletal ROS (+)   Abdominal   Peds  Hematology negative hematology ROS (+)   Anesthesia Other Findings   Reproductive/Obstetrics                           Anesthesia Physical Anesthesia Plan  ASA: II  Anesthesia Plan: General   Post-op Pain Management:    Induction: Intravenous  PONV Risk Score and Plan: 4 or greater and Midazolam, Dexamethasone, Ondansetron, Scopolamine patch - Pre-op and Propofol infusion  Airway Management Planned: Oral ETT  Additional Equipment:   Intra-op Plan:   Post-operative Plan: Extubation in OR  Informed Consent: I have reviewed the patients History and Physical, chart, labs and discussed the procedure including the risks, benefits and alternatives for the proposed anesthesia with the patient or authorized representative who has indicated his/her understanding and acceptance.     Dental advisory given  Plan Discussed with: CRNA  Anesthesia Plan Comments:        Anesthesia Quick Evaluation

## 2019-10-13 ENCOUNTER — Encounter (HOSPITAL_BASED_OUTPATIENT_CLINIC_OR_DEPARTMENT_OTHER): Payer: Self-pay | Admitting: Obstetrics and Gynecology

## 2019-10-13 DIAGNOSIS — G8929 Other chronic pain: Secondary | ICD-10-CM | POA: Diagnosis not present

## 2019-10-13 DIAGNOSIS — R102 Pelvic and perineal pain: Secondary | ICD-10-CM | POA: Diagnosis not present

## 2019-10-13 DIAGNOSIS — Z885 Allergy status to narcotic agent status: Secondary | ICD-10-CM | POA: Diagnosis not present

## 2019-10-13 DIAGNOSIS — K660 Peritoneal adhesions (postprocedural) (postinfection): Secondary | ICD-10-CM | POA: Diagnosis not present

## 2019-10-13 DIAGNOSIS — N72 Inflammatory disease of cervix uteri: Secondary | ICD-10-CM | POA: Diagnosis not present

## 2019-10-13 DIAGNOSIS — N840 Polyp of corpus uteri: Secondary | ICD-10-CM | POA: Diagnosis not present

## 2019-10-13 DIAGNOSIS — D251 Intramural leiomyoma of uterus: Secondary | ICD-10-CM | POA: Diagnosis not present

## 2019-10-13 DIAGNOSIS — N888 Other specified noninflammatory disorders of cervix uteri: Secondary | ICD-10-CM | POA: Diagnosis not present

## 2019-10-13 DIAGNOSIS — N803 Endometriosis of pelvic peritoneum: Secondary | ICD-10-CM | POA: Diagnosis not present

## 2019-10-13 LAB — CBC
HCT: 40.6 % (ref 36.0–46.0)
Hemoglobin: 12.5 g/dL (ref 12.0–15.0)
MCH: 29.8 pg (ref 26.0–34.0)
MCHC: 30.8 g/dL (ref 30.0–36.0)
MCV: 96.9 fL (ref 80.0–100.0)
Platelets: 294 10*3/uL (ref 150–400)
RBC: 4.19 MIL/uL (ref 3.87–5.11)
RDW: 12.5 % (ref 11.5–15.5)
WBC: 14.6 10*3/uL — ABNORMAL HIGH (ref 4.0–10.5)
nRBC: 0 % (ref 0.0–0.2)

## 2019-10-13 LAB — SURGICAL PATHOLOGY

## 2019-10-13 MED ORDER — IBUPROFEN 200 MG PO TABS
ORAL_TABLET | ORAL | Status: AC
  Filled 2019-10-13: qty 4

## 2019-10-13 MED ORDER — ACETAMINOPHEN 500 MG PO TABS
ORAL_TABLET | ORAL | Status: AC
  Filled 2019-10-13: qty 2

## 2019-10-13 MED ORDER — ACETAMINOPHEN 500 MG PO TABS: 1000.0000 mg | ORAL_TABLET | Freq: Four times a day (QID) | ORAL | 0 refills | Status: DC | PRN

## 2019-10-13 MED ORDER — IBUPROFEN 800 MG PO TABS: 800.0000 mg | ORAL_TABLET | Freq: Three times a day (TID) | ORAL | 1 refills | Status: DC

## 2019-10-13 MED FILL — IBUPROFEN 800 MG TAB: 800 | 13 days supply | Qty: 40 | Fill #0

## 2019-10-13 NOTE — Discharge Summary (Signed)
Physician Discharge Summary  Patient ID: Hailey Noble MRN: [id number] DOB/AGE: [birth year] 54 y.o.  Admit date: 10/12/2019 Discharge date: 10/13/2019  Admission Diagnoses:  Discharge Diagnoses:  Active Problems:   Pelvic pain   Post-operative state   Discharged Condition: stable  Hospital Course: Pt recovered well post op. Ready for discharge to home this am  Consults: None  Significant Diagnostic Studies: labs: cbc wnl  Treatments: analgesia: acetaminophen and ibuprofen  Discharge Exam: Blood pressure (!) 126/57, pulse 92, temperature 98.3 F (36.8 C), resp. rate 16, height 5\' 5"  (1.651 m), weight 96.4 kg, SpO2 96 %. General appearance: alert, cooperative and no distress GI: soft, non-tender; bowel sounds normal; no masses,  no organomegaly Incision/Wound:c/d/i  Disposition: Discharge disposition: 01-Home or Self Care       Discharge Instructions    Call MD for:  difficulty breathing, headache or visual disturbances   Complete by: As directed    Call MD for:  persistant dizziness or light-headedness   Complete by: As directed    Call MD for:  persistant nausea and vomiting   Complete by: As directed    Call MD for:  redness, tenderness, or signs of infection (pain, swelling, redness, odor or green/yellow discharge around incision site)   Complete by: As directed    Call MD for:  severe uncontrolled pain   Complete by: As directed    Call MD for:  temperature >100.4   Complete by: As directed    Diet - low sodium heart healthy   Complete by: As directed    Discharge instructions   Complete by: As directed    Call office with any concerns   Increase activity slowly   Complete by: As directed      Allergies as of 10/13/2019      Reactions   Coconut Fatty Acids Hives   Codeine    Mango Flavor Hives   [name] Itching, Swelling   Oxycodone-acetaminophen    rash on face and head, itching   Tussionex Pennkinetic Er [hydrocod Polst-cpm Polst Er]    rash on face and head, itching   Ultram [tramadol] Other (See Comments)   Dizzy and low blood pressure.       Medication List    TAKE these medications   acetaminophen 500 MG tablet Commonly known as: TYLENOL Take 2 tablets (1,000 mg total) by mouth every 6 (six) hours as needed for moderate pain. What changed:   how much to take  reasons to take this   CALTRATE 600+D PO Take 2 tablets by mouth daily.   escitalopram 10 MG tablet Commonly known as: LEXAPRO TAKE 1 TABLET BY MOUTH ONCE DAILY What changed:   how much to take  when to take this   ibuprofen 800 MG tablet Commonly known as: ADVIL Take 1 tablet (800 mg total) by mouth every 8 (eight) hours. What changed:   medication strength  how much to take  when to take this  reasons to take this   Krill Oil 500 MG Caps Take 500 mg by mouth daily.   metFORMIN 500 MG tablet Commonly known as: GLUCOPHAGE Take 1 tablet (500 mg total) by mouth 2 (two) times daily with a meal. What changed: when to take this   mometasone 50 MCG/ACT nasal spray Commonly known as: NASONEX Place 2 sprays into the nose daily. What changed:   when to take this  reasons to take this   multivitamin with minerals Tabs tablet Take 1 tablet by mouth  daily.   Soothe XP Soln Place 1 drop into both eyes daily as needed (dry eyes).   vitamin C 500 MG tablet Commonly known as: ASCORBIC ACID Take 500 mg by mouth daily.   Vitamin D 50 MCG (2000 UT) tablet Take 4,000 Units by mouth daily.      Follow-up Information    Schedule an appointment as soon as possible for a visit with Sherlyn Hay, DO.   Specialty: Obstetrics and Gynecology Why: For post operative incision check in two weeks and post operative visit in 6 weeks Contact information: [address] [place] [address] [phone]           Signed: Isaiah Serge 10/13/2019, 8:59 AM

## 2019-10-13 NOTE — Progress Notes (Signed)
1 Day Post-Op Procedure(s) (LRB): TOTAL LAPAROSCOPIC HYSTERECTOMY WITH BILATERAL SALPINGO OOPHORECTOMY, Lysis Pelvic Adhseions  (Bilateral) CYSTOSCOPY (N/A)  Subjective: Patient reports incisional pain, tolerating PO, + flatus and no problems voiding.  Has ambulated multiple times in hallway. Feels great. Pain well controlled with ibuprofen and tylenol  Objective: I have reviewed patient's vital signs, intake and output, medications and labs.  General: alert, cooperative and no distress GI: soft, non-tender; bowel sounds normal; no masses,  no organomegaly and incision: clean, dry and intact Extremities: Homans sign is negative, no sign of DVT Vaginal Bleeding: minimal  Assessment: s/p Procedure(s): TOTAL LAPAROSCOPIC HYSTERECTOMY WITH BILATERAL SALPINGO OOPHORECTOMY, Lysis Pelvic Adhseions  (Bilateral) CYSTOSCOPY (N/A): stable  Plan: Discharge home; instructions reviewed  LOS: 0 days    Hailey Noble W Brinlee Gambrell 10/13/2019, 8:53 AM

## 2019-10-13 NOTE — Discharge Instructions (Signed)
Laparoscopically Assisted Vaginal Hysterectomy, Care After °This sheet gives you information about how to care for yourself after your procedure. Your health care provider may also give you more specific instructions. If you have problems or questions, contact your health care provider. °What can I expect after the procedure? °After the procedure, it is common to have: °· Soreness and numbness in your incision areas. °· Abdominal pain. You will be given pain medicine to control it. °· Vaginal bleeding and discharge. You will need to use a sanitary napkin after this procedure. °· Sore throat from the breathing tube that was inserted during surgery. °Follow these instructions at home: °Medicines °· Take over-the-counter and prescription medicines only as told by your health care provider. °· Do not take aspirin or ibuprofen. These medicines can cause bleeding. °· Do not drive or use heavy machinery while taking prescription pain medicine. °· Do not drive for 24 hours if you were given a medicine to help you relax (sedative) during the procedure. °Incision care ° °· Follow instructions from your health care provider about how to take care of your incisions. Make sure you: °? Wash your hands with soap and water before you change your bandage (dressing). If soap and water are not available, use hand sanitizer. °? Change your dressing as told by your health care provider. °? Leave stitches (sutures), skin glue, or adhesive strips in place. These skin closures may need to stay in place for 2 weeks or longer. If adhesive strip edges start to loosen and curl up, you may trim the loose edges. Do not remove adhesive strips completely unless your health care provider tells you to do that. °· Check your incision area every day for signs of infection. Check for: °? Redness, swelling, or pain. °? Fluid or blood. °? Warmth. °? Pus or a bad smell. °Activity °· Get regular exercise as told by your health care provider. You may be  told to take short walks every day and go farther each time. °· Return to your normal activities as told by your health care provider. Ask your health care provider what activities are safe for you. °· Do not douche, use tampons, or have sexual intercourse for at least 6 weeks, or until your health care provider gives you permission. °· Do not lift anything that is heavier than 10 lb (4.5 kg), or the limit that your health care provider tells you, until he or she says that it is safe. °General instructions °· Do not take baths, swim, or use a hot tub until your health care provider approves. Take showers instead of baths. °· Do not drive for 24 hours if you received a sedative. °· Do not drive or operate heavy machinery while taking prescription pain medicine. °· To prevent or treat constipation while you are taking prescription pain medicine, your health care provider may recommend that you: °? Drink enough fluid to keep your urine clear or pale yellow. °? Take over-the-counter or prescription medicines. °? Eat foods that are high in fiber, such as fresh fruits and vegetables, whole grains, and beans. °? Limit foods that are high in fat and processed sugars, such as fried and sweet foods. °· Keep all follow-up visits as told by your health care provider. This is important. °Contact a health care provider if: °· You have signs of infection, such as: °? Redness, swelling, or pain around your incision sites. °? Fluid or blood coming from an incision. °? An incision that feels warm to the   touch. ? Pus or a bad smell coming from an incision.  Your incision breaks open.  Your pain medicine is not helping.  You feel dizzy or light-headed.  You have pain or bleeding when you urinate.  You have persistent nausea and vomiting.  You have blood, pus, or a bad-smelling discharge from your vagina. Get help right away if:  You have a fever.  You have severe abdominal pain.  You have chest pain.  You have  shortness of breath.  You faint.  You have pain, swelling, or redness in your leg.  You have heavy bleeding from your vagina. Summary  After the procedure, it is common to have abdominal pain and vaginal bleeding.  You should not drive or lift heavy objects until your health care provider says that it is safe.  Contact your health care provider if you have any symptoms of infection, excessive vaginal bleeding, nausea, vomiting, or shortness of breath. This information is not intended to replace advice given to you by your health care provider. Make sure you discuss any questions you have with your health care provider. Document Released: 11/28/2011 Document Revised: 11/21/2017 Document Reviewed: 02/04/2017 Elsevier Patient Education  2020 [employer]. Call office with any concerns 336 [phone]

## 2019-10-18 ENCOUNTER — Other Ambulatory Visit: Payer: Self-pay

## 2019-10-18 ENCOUNTER — Telehealth (INDEPENDENT_AMBULATORY_CARE_PROVIDER_SITE_OTHER): Payer: 59 | Admitting: Physician Assistant

## 2019-10-18 ENCOUNTER — Encounter (INDEPENDENT_AMBULATORY_CARE_PROVIDER_SITE_OTHER): Payer: Self-pay | Admitting: Physician Assistant

## 2019-10-18 DIAGNOSIS — E559 Vitamin D deficiency, unspecified: Secondary | ICD-10-CM | POA: Diagnosis not present

## 2019-10-18 DIAGNOSIS — Z6841 Body Mass Index (BMI) 40.0 and over, adult: Secondary | ICD-10-CM

## 2019-10-19 NOTE — Progress Notes (Signed)
Office: [phone]  /  Fax: [phone] TeleHealth Visit:  Hailey Noble has verbally consented to this TeleHealth visit today. The patient is located at home, the provider is located at the [facility] office. The participants in this visit include the listed provider and patient. The visit was conducted today via webex.  HPI:   Chief Complaint: OBESITY Hailey Noble is here to discuss her progress with her obesity treatment plan. She is on the keep a food journal with 1400-1500 calories and 90+ grams of protein daily or follow the Category 3 plan and is following her eating plan approximately 20-30 % of the time. She states she is exercising 0 minutes 0 times per week. Hailey Noble reports that she just had a hysterectomy last week. She is doing well but is not hungry yet.  We were unable to weigh the patient today for this TeleHealth visit. She feels as if she has lost weight since her last visit. She has lost 4 lbs since starting treatment with [name].  Vitamin D Deficiency Hailey Noble has a diagnosis of vitamin D deficiency. She is currently taking OTC Vit D and denies nausea, vomiting or muscle weakness.  ASSESSMENT AND PLAN:  Vitamin D deficiency  Class 3 severe obesity with serious comorbidity and body mass index (BMI) of 40.0 to 44.9 in adult, unspecified obesity type ([place])  PLAN:  Vitamin D Deficiency Hailey Noble was informed that low vitamin D levels contributes to fatigue and are associated with obesity, breast, and colon cancer. Hailey Noble agrees to continue taking OTC Vit D and will follow up for routine testing of vitamin D, at least 2-3 times per year. She was informed of the risk of over-replacement of vitamin D and agrees to not increase her dose unless she discusses this with [name] first. Hailey Noble agrees to follow up with our clinic in 2 weeks.  Obesity Hailey Noble is currently in the action stage of change. As such, her goal is to continue with weight loss efforts She has agreed to keep  a food journal with 1400 calories and 90 grams of protein daily Hailey Noble has been instructed to work up to a goal of 150 minutes of combined cardio and strengthening exercise per week for weight loss and overall health benefits. We discussed the following Behavioral Modification Strategies today: work on meal planning and easy cooking plans and no skipping meals   Hailey Noble has agreed to follow up with our clinic in 2 weeks. She was informed of the importance of frequent follow up visits to maximize her success with intensive lifestyle modifications for her multiple health conditions.  ALLERGIES: Allergies  Allergen Reactions  . Coconut Fatty Acids Hives  . Codeine   . Mango Flavor Hives  . [name] Itching and Swelling  . Oxycodone-Acetaminophen     rash on face and head, itching  . Tussionex Pennkinetic Er [Hydrocod Polst-Cpm Polst Er]     rash on face and head, itching  . Ultram [Tramadol] Other (See Comments)    Dizzy and low blood pressure.     MEDICATIONS: Current Outpatient Medications on File Prior to Visit  Medication Sig Dispense Refill  . acetaminophen (TYLENOL) 500 MG tablet Take 2 tablets (1,000 mg total) by mouth every 6 (six) hours as needed for moderate pain. 30 tablet 0  . Artificial Tear Solution (SOOTHE XP) SOLN Place 1 drop into both eyes daily as needed (dry eyes).    . Calcium Carbonate-Vitamin D (CALTRATE 600+D PO) Take 2 tablets by mouth daily.    Marland Kitchen  Cholecalciferol (VITAMIN D) 50 MCG (2000 UT) tablet Take 4,000 Units by mouth daily.    Marland Kitchen escitalopram (LEXAPRO) 10 MG tablet TAKE 1 TABLET BY MOUTH ONCE DAILY (Patient taking differently: Take 15 mg by mouth at bedtime. ) 90 tablet 3  . ibuprofen (ADVIL) 800 MG tablet Take 1 tablet (800 mg total) by mouth every 8 (eight) hours. 40 tablet 1  . Krill Oil 500 MG CAPS Take 500 mg by mouth daily.    . metFORMIN (GLUCOPHAGE) 500 MG tablet Take 1 tablet (500 mg total) by mouth 2 (two) times daily with a meal. (Patient taking  differently: Take 500 mg by mouth daily after lunch. ) 60 tablet 0  . mometasone (NASONEX) 50 MCG/ACT nasal spray Place 2 sprays into the nose daily. (Patient taking differently: Place 2 sprays into the nose daily as needed (allergies). ) 17 g 6  . Multiple Vitamin (MULTIVITAMIN WITH MINERALS) TABS tablet Take 1 tablet by mouth daily.    . vitamin C (ASCORBIC ACID) 500 MG tablet Take 500 mg by mouth daily.     No current facility-administered medications on file prior to visit.     PAST MEDICAL HISTORY: Past Medical History:  Diagnosis Date  . Allergic state 12/08/2016  . Allergy   . Anxiety   . Back pain    " i just have a weak loer back"   . Fibroids   . GERD (gastroesophageal reflux disease)   . Hyperlipidemia 06/09/2017  . Insulin resistance   . Joint pain   . Knee pain, right 03/10/2017  . Plantar fasciitis   . PONV (postoperative nausea and vomiting)    just nausea   . Shortness of breath    not only if  i exert myselg   . Sinusitis   . Vitamin D deficiency 11/22/2016  . Vitamin D deficiency     PAST SURGICAL HISTORY: Past Surgical History:  Procedure Laterality Date  . COLONOSCOPY  02-2009   with Henrene Pastor normal  . CYSTOSCOPY N/A 10/12/2019   Procedure: CYSTOSCOPY;  Surgeon: Sherlyn Hay, DO;  Location: [place];  Service: Gynecology;  Laterality: N/A;  . ESOPHAGOGASTRODUODENOSCOPY  2012  . GANGLION CYST EXCISION Right    right- wrist   . NASAL SEPTUM SURGERY    . NASAL SINUS SURGERY    . TMJ ARTHROSCOPY     left  . TOTAL LAPAROSCOPIC HYSTERECTOMY WITH BILATERAL SALPINGO OOPHORECTOMY Bilateral 10/12/2019   Procedure: TOTAL LAPAROSCOPIC HYSTERECTOMY WITH BILATERAL SALPINGO OOPHORECTOMY, Lysis Pelvic Adhseions ;  Surgeon: Sherlyn Hay, DO;  Location: [place];  Service: Gynecology;  Laterality: Bilateral;    SOCIAL HISTORY: Social History   Tobacco Use  . Smoking status: Never Smoker  . Smokeless tobacco: Never  Used  Substance Use Topics  . Alcohol use: Yes    Alcohol/week: 0.0 standard drinks    Comment: rare  . Drug use: No    FAMILY HISTORY: Family History  Problem Relation Age of Onset  . Hypertension Father   . Diabetes Father   . Hyperlipidemia Father   . Breast cancer Mother 59  . Cancer Mother        breast  . Thyroid disease Mother   . Sleep apnea Mother   . Colon cancer Paternal Grandfather        died at age 89  . Cancer Paternal Grandfather        GI CA  . Obesity Sister   . Diabetes Sister   .  Hypertension Sister   . Heart disease Maternal Grandmother        MI  . Cancer Maternal Grandfather        lung, smoker  . Diabetes Maternal Grandfather   . Diabetes Paternal Grandmother   . Heart disease Paternal Grandmother   . Pancreatitis Sister   . Esophageal cancer Neg Hx   . Stomach cancer Neg Hx   . Stroke Neg Hx   . Colon polyps Neg Hx   . Rectal cancer Neg Hx   . Pancreatic cancer Neg Hx     ROS: Review of Systems  Constitutional: Positive for weight loss.  Gastrointestinal: Negative for nausea and vomiting.  Musculoskeletal:       Negative muscle weakness    PHYSICAL EXAM: Pt in no acute distress  RECENT LABS AND TESTS: BMET    Component Value Date/Time   NA 138 10/08/2019 0953   NA 140 12/30/2018 1004   K 4.1 10/08/2019 0953   CL 106 10/08/2019 0953   CO2 24 10/08/2019 0953   GLUCOSE 87 10/08/2019 0953   GLUCOSE 86 11/13/2009   BUN 21 (H) 10/08/2019 0953   BUN 18 12/30/2018 1004   CREATININE 0.59 10/08/2019 0953   CREATININE 0.67 11/22/2016 1521   CALCIUM 9.1 10/08/2019 0953   GFRNONAA >60 10/08/2019 0953   GFRAA >60 10/08/2019 0953   Lab Results  Component Value Date   HGBA1C 5.3 10/08/2019   HGBA1C 5.4 06/16/2019   HGBA1C 5.4 12/30/2018   HGBA1C 5.4 09/21/2018   HGBA1C 5.4 04/30/2018   Lab Results  Component Value Date   INSULIN 9.3 12/30/2018   INSULIN 11.1 09/21/2018   INSULIN 8.7 04/30/2018   INSULIN 8.8 10/06/2017    CBC    Component Value Date/Time   WBC 14.6 (H) 10/13/2019 0430   RBC 4.19 10/13/2019 0430   HGB 12.5 10/13/2019 0430   HGB 13.8 10/06/2017 0850   HCT 40.6 10/13/2019 0430   HCT 42.4 10/06/2017 0850   PLT 294 10/13/2019 0430   PLT 381 11/13/2009   MCV 96.9 10/13/2019 0430   MCV 90 10/06/2017 0850   MCH 29.8 10/13/2019 0430   MCHC 30.8 10/13/2019 0430   RDW 12.5 10/13/2019 0430   RDW 13.5 10/06/2017 0850   LYMPHSABS 1.8 10/06/2017 0850   MONOABS 0.5 05/26/2015 0931   EOSABS 0.1 10/06/2017 0850   BASOSABS 0.1 10/06/2017 0850   Iron/TIBC/Ferritin/ %Sat    Component Value Date/Time   FERRITIN 50 11/09/2008   Lipid Panel     Component Value Date/Time   CHOL 162 12/30/2018 1004   TRIG 109 12/30/2018 1004   TRIG 138 11/13/2009 1337   HDL 51 12/30/2018 1004   CHOLHDL 3.3 04/30/2018 1042   CHOLHDL 3.9 11/22/2016 1521   VLDL 32 (H) 11/22/2016 1521   LDLCALC 89 12/30/2018 1004   Hepatic Function Panel     Component Value Date/Time   PROT 6.7 12/30/2018 1004   ALBUMIN 4.2 12/30/2018 1004   AST 14 12/30/2018 1004   ALT 20 12/30/2018 1004   ALKPHOS 144 (H) 12/30/2018 1004   BILITOT 0.2 12/30/2018 1004   BILIDIR 0.1 05/26/2015 0931      Component Value Date/Time   TSH 2.360 06/16/2019 0807   TSH 1.690 10/06/2017 0850   TSH 1.30 11/22/2016 1521      I, Trixie Dredge, am acting as transcriptionist for Abby Potash, PA-C I, Abby Potash, PA-C have reviewed above note and agree with its content

## 2019-10-25 MED FILL — IBUPROFEN 800 MG TAB: 800 | 13 days supply | Qty: 40 | Fill #1

## 2019-11-04 ENCOUNTER — Telehealth (INDEPENDENT_AMBULATORY_CARE_PROVIDER_SITE_OTHER): Payer: 59 | Admitting: Physician Assistant

## 2019-11-04 ENCOUNTER — Other Ambulatory Visit: Payer: Self-pay

## 2019-11-04 ENCOUNTER — Encounter (INDEPENDENT_AMBULATORY_CARE_PROVIDER_SITE_OTHER): Payer: Self-pay | Admitting: Physician Assistant

## 2019-11-04 DIAGNOSIS — E669 Obesity, unspecified: Secondary | ICD-10-CM

## 2019-11-04 DIAGNOSIS — Z6834 Body mass index (BMI) 34.0-34.9, adult: Secondary | ICD-10-CM

## 2019-11-04 DIAGNOSIS — E8881 Metabolic syndrome: Secondary | ICD-10-CM

## 2019-11-08 NOTE — Progress Notes (Signed)
Office: [phone]  /  Fax: [phone] TeleHealth Visit:  Hailey Noble has verbally consented to this TeleHealth visit today. The patient is located at home, the provider is located at the [employer] [facility] office. The participants in this visit include the listed provider and patient and any and all parties involved. The visit was conducted today via WebEx.  HPI:   Chief Complaint: OBESITY Hailey Noble is here to discuss her progress with her obesity treatment plan. She is on the Category 2 plan and journaling and she is following her eating plan approximately 85 % of the time. She states she is exercising 0 minutes 0 times per week. Frady's most recent weight was 210 pounds (11/04/19). She reports that her appetite has returned since having her hysterectomy. We were unable to weigh the patient today for this TeleHealth visit. She feels as if she has gained weight since her last visit. She has lost 4 lbs since starting treatment with [name].  Insulin Resistance Hailey Noble has a diagnosis of insulin resistance based on her elevated fasting insulin level >5. Although Hailey Noble's blood glucose readings are still under good control, insulin resistance puts her at greater risk of metabolic syndrome and diabetes. [name] is on metformin and she denies nausea, vomiting or diarrhea. She continues to work on diet and exercise to decrease risk of diabetes. Thaily denies polyphagia or hypoglycemia.  ASSESSMENT AND PLAN:  Insulin resistance  Class 1 obesity with serious comorbidity and body mass index (BMI) of 34.0 to 34.9 in adult, unspecified obesity type  PLAN:  Insulin Resistance Hailey Noble will continue to work on weight loss, exercise, and decreasing simple carbohydrates in her diet to help decrease the risk of diabetes. We dicussed metformin including benefits and risks. She was informed that eating too many simple carbohydrates or too many calories at one sitting increases the likelihood of GI side  effects. Chrisley will continue with metformin and weight loss. She will follow up with [name] as directed to monitor her progress.  Obesity Hailey Noble is currently in the action stage of change. As such, her goal is to continue with weight loss efforts She has agreed to keep a food journal with 1300 to 1400 calories and 90 grams of protein daily [name] has been instructed to work up to a goal of 150 minutes of combined cardio and strengthening exercise per week for weight loss and overall health benefits. We discussed the following Behavioral Modification Strategies today: keeping healthy foods in the home and work on meal planning and easy cooking plans  Hailey Noble has agreed to follow up with our clinic in 3 weeks. She was informed of the importance of frequent follow up visits to maximize her success with intensive lifestyle modifications for her multiple health conditions.  I spent > than 50% of the 25 minute visit on counseling as documented in the note.   ALLERGIES: Allergies  Allergen Reactions   Coconut Fatty Acids Hives   Codeine    Mango Flavor Hives   [name] Itching and Swelling   Oxycodone-Acetaminophen     rash on face and head, itching   Tussionex Pennkinetic Er [Hydrocod Polst-Cpm Polst Er]     rash on face and head, itching   Ultram [Tramadol] Other (See Comments)    Dizzy and low blood pressure.     MEDICATIONS: Current Outpatient Medications on File Prior to Visit  Medication Sig Dispense Refill   acetaminophen (TYLENOL) 500 MG tablet Take 2 tablets (1,000 mg total) by mouth every  6 (six) hours as needed for moderate pain. 30 tablet 0   Artificial Tear Solution (SOOTHE XP) SOLN Place 1 drop into both eyes daily as needed (dry eyes).     Calcium Carbonate-Vitamin D (CALTRATE 600+D PO) Take 2 tablets by mouth daily.     Cholecalciferol (VITAMIN D) 50 MCG (2000 UT) tablet Take 4,000 Units by mouth daily.     escitalopram (LEXAPRO) 10 MG tablet TAKE 1 TABLET BY  MOUTH ONCE DAILY (Patient taking differently: Take 15 mg by mouth at bedtime. ) 90 tablet 3   ibuprofen (ADVIL) 800 MG tablet Take 1 tablet (800 mg total) by mouth every 8 (eight) hours. 40 tablet 1   Krill Oil 500 MG CAPS Take 500 mg by mouth daily.     metFORMIN (GLUCOPHAGE) 500 MG tablet Take 1 tablet (500 mg total) by mouth 2 (two) times daily with a meal. (Patient taking differently: Take 500 mg by mouth daily after lunch. ) 60 tablet 0   mometasone (NASONEX) 50 MCG/ACT nasal spray Place 2 sprays into the nose daily. (Patient taking differently: Place 2 sprays into the nose daily as needed (allergies). ) 17 g 6   Multiple Vitamin (MULTIVITAMIN WITH MINERALS) TABS tablet Take 1 tablet by mouth daily.     vitamin C (ASCORBIC ACID) 500 MG tablet Take 500 mg by mouth daily.     No current facility-administered medications on file prior to visit.     PAST MEDICAL HISTORY: Past Medical History:  Diagnosis Date   Allergic state 12/08/2016   Allergy    Anxiety    Back pain    " i just have a weak loer back"    Fibroids    GERD (gastroesophageal reflux disease)    Hyperlipidemia 06/09/2017   Insulin resistance    Joint pain    Knee pain, right 03/10/2017   Plantar fasciitis    PONV (postoperative nausea and vomiting)    just nausea    Shortness of breath    not only if  i exert myselg    Sinusitis    Vitamin D deficiency 11/22/2016   Vitamin D deficiency     PAST SURGICAL HISTORY: Past Surgical History:  Procedure Laterality Date   COLONOSCOPY  02-2009   with Henrene Pastor normal   CYSTOSCOPY N/A 10/12/2019   Procedure: CYSTOSCOPY;  Surgeon: Sherlyn Hay, DO;  Location: [facility];  Service: Gynecology;  Laterality: N/A;   ESOPHAGOGASTRODUODENOSCOPY  2012   GANGLION CYST EXCISION Right    right- wrist    NASAL SEPTUM SURGERY     NASAL SINUS SURGERY     TMJ ARTHROSCOPY     left   TOTAL LAPAROSCOPIC HYSTERECTOMY WITH BILATERAL  SALPINGO OOPHORECTOMY Bilateral 10/12/2019   Procedure: TOTAL LAPAROSCOPIC HYSTERECTOMY WITH BILATERAL SALPINGO OOPHORECTOMY, Lysis Pelvic Adhseions ;  Surgeon: Sherlyn Hay, DO;  Location: [facility];  Service: Gynecology;  Laterality: Bilateral;    SOCIAL HISTORY: Social History   Tobacco Use   Smoking status: Never Smoker   Smokeless tobacco: Never Used  Substance Use Topics   Alcohol use: Yes    Alcohol/week: 0.0 standard drinks    Comment: rare   Drug use: No    FAMILY HISTORY: Family History  Problem Relation Age of Onset   Hypertension Father    Diabetes Father    Hyperlipidemia Father    Breast cancer Mother 77   Cancer Mother        breast  Thyroid disease Mother    Sleep apnea Mother    Colon cancer Paternal Grandfather        died at age 7   Cancer Paternal Grandfather        GI CA   Obesity Sister    Diabetes Sister    Hypertension Sister    Heart disease Maternal Grandmother        MI   Cancer Maternal Grandfather        lung, smoker   Diabetes Maternal Grandfather    Diabetes Paternal Grandmother    Heart disease Paternal Grandmother    Pancreatitis Sister    Esophageal cancer Neg Hx    Stomach cancer Neg Hx    Stroke Neg Hx    Colon polyps Neg Hx    Rectal cancer Neg Hx    Pancreatic cancer Neg Hx     ROS: Review of Systems  Constitutional: Negative for weight loss.  Gastrointestinal: Negative for diarrhea, nausea and vomiting.  Endo/Heme/Allergies:       Negative for polyphagia Negative for hypoglycemia    PHYSICAL EXAM: Pt in no acute distress  RECENT LABS AND TESTS: BMET    Component Value Date/Time   NA 138 10/08/2019 0953   NA 140 12/30/2018 1004   K 4.1 10/08/2019 0953   CL 106 10/08/2019 0953   CO2 24 10/08/2019 0953   GLUCOSE 87 10/08/2019 0953   GLUCOSE 86 11/13/2009   BUN 21 (H) 10/08/2019 0953   BUN 18 12/30/2018 1004   CREATININE 0.59 10/08/2019 0953    CREATININE 0.67 11/22/2016 1521   CALCIUM 9.1 10/08/2019 0953   GFRNONAA >60 10/08/2019 0953   GFRAA >60 10/08/2019 0953   Lab Results  Component Value Date   HGBA1C 5.3 10/08/2019   HGBA1C 5.4 06/16/2019   HGBA1C 5.4 12/30/2018   HGBA1C 5.4 09/21/2018   HGBA1C 5.4 04/30/2018   Lab Results  Component Value Date   INSULIN 9.3 12/30/2018   INSULIN 11.1 09/21/2018   INSULIN 8.7 04/30/2018   INSULIN 8.8 10/06/2017   CBC    Component Value Date/Time   WBC 14.6 (H) 10/13/2019 0430   RBC 4.19 10/13/2019 0430   HGB 12.5 10/13/2019 0430   HGB 13.8 10/06/2017 0850   HCT 40.6 10/13/2019 0430   HCT 42.4 10/06/2017 0850   PLT 294 10/13/2019 0430   PLT 381 11/13/2009   MCV 96.9 10/13/2019 0430   MCV 90 10/06/2017 0850   MCH 29.8 10/13/2019 0430   MCHC 30.8 10/13/2019 0430   RDW 12.5 10/13/2019 0430   RDW 13.5 10/06/2017 0850   LYMPHSABS 1.8 10/06/2017 0850   MONOABS 0.5 05/26/2015 0931   EOSABS 0.1 10/06/2017 0850   BASOSABS 0.1 10/06/2017 0850   Iron/TIBC/Ferritin/ %Sat    Component Value Date/Time   FERRITIN 50 11/09/2008   Lipid Panel     Component Value Date/Time   CHOL 162 12/30/2018 1004   TRIG 109 12/30/2018 1004   TRIG 138 11/13/2009 1337   HDL 51 12/30/2018 1004   CHOLHDL 3.3 04/30/2018 1042   CHOLHDL 3.9 11/22/2016 1521   VLDL 32 (H) 11/22/2016 1521   LDLCALC 89 12/30/2018 1004   Hepatic Function Panel     Component Value Date/Time   PROT 6.7 12/30/2018 1004   ALBUMIN 4.2 12/30/2018 1004   AST 14 12/30/2018 1004   ALT 20 12/30/2018 1004   ALKPHOS 144 (H) 12/30/2018 1004   BILITOT 0.2 12/30/2018 1004   BILIDIR 0.1 05/26/2015 0931  Component Value Date/Time   TSH 2.360 06/16/2019 0807   TSH 1.690 10/06/2017 0850   TSH 1.30 11/22/2016 1521     Ref. Range 06/16/2019 08:07  Vitamin D, 25-Hydroxy Latest Ref Range: 30.0 - 100.0 ng/mL 64.7    I, Doreene Nest, am acting as Location manager for Abby Potash, PA-C I, Abby Potash, PA-C have  reviewed above note and agree with its content

## 2019-11-16 MED FILL — ESCITALOPRAM 10 MG TABLET: 10 | 90 days supply | Qty: 90 | Fill #1

## 2019-12-01 ENCOUNTER — Other Ambulatory Visit: Payer: Self-pay

## 2019-12-01 ENCOUNTER — Ambulatory Visit (INDEPENDENT_AMBULATORY_CARE_PROVIDER_SITE_OTHER): Payer: 59 | Admitting: Physician Assistant

## 2019-12-01 ENCOUNTER — Encounter (INDEPENDENT_AMBULATORY_CARE_PROVIDER_SITE_OTHER): Payer: Self-pay | Admitting: Physician Assistant

## 2019-12-01 VITALS — BP 135/74 | HR 84 | Temp 97.4°F | Ht 65.0 in | Wt 208.0 lb

## 2019-12-01 DIAGNOSIS — R7303 Prediabetes: Secondary | ICD-10-CM | POA: Diagnosis not present

## 2019-12-01 DIAGNOSIS — E669 Obesity, unspecified: Secondary | ICD-10-CM | POA: Diagnosis not present

## 2019-12-01 DIAGNOSIS — E559 Vitamin D deficiency, unspecified: Secondary | ICD-10-CM

## 2019-12-01 DIAGNOSIS — Z9189 Other specified personal risk factors, not elsewhere classified: Secondary | ICD-10-CM | POA: Diagnosis not present

## 2019-12-01 DIAGNOSIS — Z6834 Body mass index (BMI) 34.0-34.9, adult: Secondary | ICD-10-CM

## 2019-12-01 MED ORDER — METFORMIN HCL 500 MG PO TABS: 500.0000 mg | ORAL_TABLET | Freq: Two times a day (BID) | ORAL | 0 refills | Status: DC

## 2019-12-01 MED FILL — metFORMIN HCL 500 MG TABS: 500 | 30 days supply | Qty: 60 | Fill #0

## 2019-12-01 NOTE — Progress Notes (Signed)
Office: [phone]  /  Fax: [phone]   HPI:   Chief Complaint: OBESITY Hailey Noble is here to discuss her progress with her obesity treatment plan. She is on the keep a food journal with 1300 to 1400 calories and 90 grams of protein daily plan and the Category 2 plan and is following her eating plan approximately 90 % of the time. She states she is walking 10,000 steps a day at work. Hailey Noble reports that she has been released from her surgeon to resume full activities. Her appetite is back to normal. She wants to start swimming again. Her weight is 208 lb (94.3 kg) today and she has maintained weight since her last in-office visit. She has lost 4 lbs since starting treatment with [name].  Vitamin D deficiency Hailey Noble has a diagnosis of vitamin D deficiency. Hailey Noble is on OTC vit D and she denies nausea, vomiting or muscle weakness.  At risk for osteopenia and osteoporosis Hailey Noble is at higher risk of osteopenia and osteoporosis due to vitamin D deficiency.   Pre-Diabetes Hailey Noble has a diagnosis of prediabetes based on her elevated Hgb A1c and was informed this puts her at greater risk of developing diabetes. She has no nausea, vomiting or diarrhea on metformin. She continues to work on diet and exercise to decrease risk of diabetes. She denies polyphagia or hypoglycemia.  ASSESSMENT AND PLAN:  Vitamin D deficiency  Prediabetes - Plan: metFORMIN (GLUCOPHAGE) 500 MG tablet  At risk for osteoporosis  Class 1 obesity with serious comorbidity and body mass index (BMI) of 34.0 to 34.9 in adult, unspecified obesity type  PLAN:  Vitamin D Deficiency Low vitamin D level contributes to fatigue and are associated with obesity, breast, and colon cancer. Hailey Noble agrees to continue to take OTC Vit D and she will follow up for routine testing of vitamin D, at least 2-3 times per year to avoid over-replacement.  At risk for osteopenia and osteoporosis Hailey Noble was given extended  (15 minutes)  osteoporosis prevention counseling today. Hailey Noble is at risk for osteopenia and osteoporosis due to her vitamin D deficiency. She was encouraged to take her vitamin D and follow her higher calcium diet and increase strengthening exercise to help strengthen her bones and decrease her risk of osteopenia and osteoporosis.  Pre-Diabetes Hailey Noble will continue to work on weight loss, exercise, and decreasing simple carbohydrates to help decrease the risk of diabetes. Hailey Noble agrees to continue metformin 500 mg two times daily #60 with no refills and follow up as directed.  Obesity Hailey Noble is currently in the action stage of change. As such, her goal is to continue with weight loss efforts She has agreed to keep a food journal with 1300 to 1400 calories and 95 grams of protein  Hailey Noble has been instructed to work up to a goal of 150 minutes of combined cardio and strengthening exercise per week for weight loss and overall health benefits. We discussed the following Behavioral Modification Strategies today: planning for success, keep a strict food journal and work on meal planning and easy cooking plans  Hailey Noble has agreed to follow up with our clinic in 3 to 4 weeks. She was informed of the importance of frequent follow up visits to maximize her success with intensive lifestyle modifications for her multiple health conditions.  ALLERGIES: Allergies  Allergen Reactions   Coconut Fatty Acids Hives   Codeine    Mango Flavor Hives   [name] Itching and Swelling   Oxycodone-Acetaminophen     rash on face  and head, itching   Tussionex Pennkinetic Er [Hydrocod Polst-Cpm Polst Er]     rash on face and head, itching   Ultram [Tramadol] Other (See Comments)    Dizzy and low blood pressure.     MEDICATIONS: Current Outpatient Medications on File Prior to Visit  Medication Sig Dispense Refill   acetaminophen (TYLENOL) 500 MG tablet Take 2 tablets (1,000 mg total) by mouth every 6 (six) hours as  needed for moderate pain. 30 tablet 0   Artificial Tear Solution (SOOTHE XP) SOLN Place 1 drop into both eyes daily as needed (dry eyes).     Calcium Carbonate-Vitamin D (CALTRATE 600+D PO) Take 2 tablets by mouth daily.     Cholecalciferol (VITAMIN D) 50 MCG (2000 UT) tablet Take 4,000 Units by mouth daily.     escitalopram (LEXAPRO) 10 MG tablet TAKE 1 TABLET BY MOUTH ONCE DAILY (Patient taking differently: Take 15 mg by mouth at bedtime. ) 90 tablet 3   ibuprofen (ADVIL) 800 MG tablet Take 1 tablet (800 mg total) by mouth every 8 (eight) hours. 40 tablet 1   Krill Oil 500 MG CAPS Take 500 mg by mouth daily.     mometasone (NASONEX) 50 MCG/ACT nasal spray Place 2 sprays into the nose daily. (Patient taking differently: Place 2 sprays into the nose daily as needed (allergies). ) 17 g 6   Multiple Vitamin (MULTIVITAMIN WITH MINERALS) TABS tablet Take 1 tablet by mouth daily.     vitamin C (ASCORBIC ACID) 500 MG tablet Take 500 mg by mouth daily.     No current facility-administered medications on file prior to visit.     PAST MEDICAL HISTORY: Past Medical History:  Diagnosis Date   Allergic state 12/08/2016   Allergy    Anxiety    Back pain    " i just have a weak loer back"    Fibroids    GERD (gastroesophageal reflux disease)    Hyperlipidemia 06/09/2017   Insulin resistance    Joint pain    Knee pain, right 03/10/2017   Plantar fasciitis    PONV (postoperative nausea and vomiting)    just nausea    Shortness of breath    not only if  i exert myselg    Sinusitis    Vitamin D deficiency 11/22/2016   Vitamin D deficiency     PAST SURGICAL HISTORY: Past Surgical History:  Procedure Laterality Date   COLONOSCOPY  02-2009   with Henrene Pastor normal   CYSTOSCOPY N/A 10/12/2019   Procedure: CYSTOSCOPY;  Surgeon: Sherlyn Hay, DO;  Location: [facility];  Service: Gynecology;  Laterality: N/A;   ESOPHAGOGASTRODUODENOSCOPY  2012    GANGLION CYST EXCISION Right    right- wrist    NASAL SEPTUM SURGERY     NASAL SINUS SURGERY     TMJ ARTHROSCOPY     left   TOTAL LAPAROSCOPIC HYSTERECTOMY WITH BILATERAL SALPINGO OOPHORECTOMY Bilateral 10/12/2019   Procedure: TOTAL LAPAROSCOPIC HYSTERECTOMY WITH BILATERAL SALPINGO OOPHORECTOMY, Lysis Pelvic Adhseions ;  Surgeon: Sherlyn Hay, DO;  Location: [facility];  Service: Gynecology;  Laterality: Bilateral;    SOCIAL HISTORY: Social History   Tobacco Use   Smoking status: Never Smoker   Smokeless tobacco: Never Used  Substance Use Topics   Alcohol use: Yes    Alcohol/week: 0.0 standard drinks    Comment: rare   Drug use: No    FAMILY HISTORY: Family History  Problem Relation Age of Onset  Hypertension Father    Diabetes Father    Hyperlipidemia Father    Breast cancer Mother 33   Cancer Mother        breast   Thyroid disease Mother    Sleep apnea Mother    Colon cancer Paternal Grandfather        died at age 77   Cancer Paternal Grandfather        GI CA   Obesity Sister    Diabetes Sister    Hypertension Sister    Heart disease Maternal Grandmother        MI   Cancer Maternal Grandfather        lung, smoker   Diabetes Maternal Grandfather    Diabetes Paternal Grandmother    Heart disease Paternal Grandmother    Pancreatitis Sister    Esophageal cancer Neg Hx    Stomach cancer Neg Hx    Stroke Neg Hx    Colon polyps Neg Hx    Rectal cancer Neg Hx    Pancreatic cancer Neg Hx     ROS: Review of Systems  Constitutional: Negative for weight loss.  Gastrointestinal: Negative for diarrhea, nausea and vomiting.  Musculoskeletal:       Negative for muscle weakness  Endo/Heme/Allergies:       Negative for polyphagia Negative for hypoglycemia    PHYSICAL EXAM: Blood pressure 135/74, pulse 84, temperature (!) 97.4 F (36.3 C), temperature source Oral, height 5\' 5"  (1.651 m), weight 208 lb (94.3  kg), last menstrual period 11/22/2018, SpO2 95 %. Body mass index is 34.61 kg/m. Physical Exam Vitals signs reviewed.  Constitutional:      Appearance: Normal appearance. She is well-developed. She is obese.  Cardiovascular:     Rate and Rhythm: Normal rate.  Pulmonary:     Effort: Pulmonary effort is normal.  Musculoskeletal: Normal range of motion.  Skin:    General: Skin is warm and dry.  Neurological:     Mental Status: She is alert and oriented to person, place, and time.  Psychiatric:        Mood and Affect: Mood normal.        Behavior: Behavior normal.     RECENT LABS AND TESTS: BMET    Component Value Date/Time   NA 138 10/08/2019 0953   NA 140 12/30/2018 1004   K 4.1 10/08/2019 0953   CL 106 10/08/2019 0953   CO2 24 10/08/2019 0953   GLUCOSE 87 10/08/2019 0953   GLUCOSE 86 11/13/2009   BUN 21 (H) 10/08/2019 0953   BUN 18 12/30/2018 1004   CREATININE 0.59 10/08/2019 0953   CREATININE 0.67 11/22/2016 1521   CALCIUM 9.1 10/08/2019 0953   GFRNONAA >60 10/08/2019 0953   GFRAA >60 10/08/2019 0953   Lab Results  Component Value Date   HGBA1C 5.3 10/08/2019   HGBA1C 5.4 06/16/2019   HGBA1C 5.4 12/30/2018   HGBA1C 5.4 09/21/2018   HGBA1C 5.4 04/30/2018   Lab Results  Component Value Date   INSULIN 9.3 12/30/2018   INSULIN 11.1 09/21/2018   INSULIN 8.7 04/30/2018   INSULIN 8.8 10/06/2017   CBC    Component Value Date/Time   WBC 14.6 (H) 10/13/2019 0430   RBC 4.19 10/13/2019 0430   HGB 12.5 10/13/2019 0430   HGB 13.8 10/06/2017 0850   HCT 40.6 10/13/2019 0430   HCT 42.4 10/06/2017 0850   PLT 294 10/13/2019 0430   PLT 381 11/13/2009   MCV 96.9 10/13/2019 0430   MCV  90 10/06/2017 0850   MCH 29.8 10/13/2019 0430   MCHC 30.8 10/13/2019 0430   RDW 12.5 10/13/2019 0430   RDW 13.5 10/06/2017 0850   LYMPHSABS 1.8 10/06/2017 0850   MONOABS 0.5 05/26/2015 0931   EOSABS 0.1 10/06/2017 0850   BASOSABS 0.1 10/06/2017 0850   Iron/TIBC/Ferritin/ %Sat      Component Value Date/Time   FERRITIN 50 11/09/2008   Lipid Panel     Component Value Date/Time   CHOL 162 12/30/2018 1004   TRIG 109 12/30/2018 1004   TRIG 138 11/13/2009 1337   HDL 51 12/30/2018 1004   CHOLHDL 3.3 04/30/2018 1042   CHOLHDL 3.9 11/22/2016 1521   VLDL 32 (H) 11/22/2016 1521   LDLCALC 89 12/30/2018 1004   Hepatic Function Panel     Component Value Date/Time   PROT 6.7 12/30/2018 1004   ALBUMIN 4.2 12/30/2018 1004   AST 14 12/30/2018 1004   ALT 20 12/30/2018 1004   ALKPHOS 144 (H) 12/30/2018 1004   BILITOT 0.2 12/30/2018 1004   BILIDIR 0.1 05/26/2015 0931      Component Value Date/Time   TSH 2.360 06/16/2019 0807   TSH 1.690 10/06/2017 0850   TSH 1.30 11/22/2016 1521    Results for CAMPLashawn, Sokolow I (MRN [id number]) as of 12/01/2019 10:36  Ref. Range 06/16/2019 08:07  Vitamin D, 25-Hydroxy Latest Ref Range: 30.0 - 100.0 ng/mL 64.7    OBESITY BEHAVIORAL INTERVENTION VISIT  Today's visit was # 62  Starting weight: 212 lbs Starting date: 10/06/2017 Today's weight : 208 lbs Today's date: 12/01/2019 Total lbs lost to date: 4    12/01/2019  Height 5\' 5"  (1.651 m)  Weight 208 lb (94.3 kg)  BMI (Calculated) 34.61  BLOOD PRESSURE - SYSTOLIC [id number]  BLOOD PRESSURE - DIASTOLIC 74   Body Fat % [id number] %  Total Body Water (lbs) 81.2 lbs    ASK: We discussed the diagnosis of obesity with Hailey Noble today and Hailey Noble agreed to give [name] permission to discuss obesity behavioral modification therapy today.  ASSESS: Hailey Noble has the diagnosis of obesity and her BMI today is 34.61 Hailey Noble is in the action stage of change   ADVISE: Hailey Noble was educated on the multiple health risks of obesity as well as the benefit of weight loss to improve her health. She was advised of the need for long term treatment and the importance of lifestyle modifications to improve her current health and to decrease her risk of future health problems.  AGREE: Multiple dietary modification  options and treatment options were discussed and  Hailey Noble agreed to follow the recommendations documented in the above note.  ARRANGE: Hailey Noble was educated on the importance of frequent visits to treat obesity as outlined per CMS and USPSTF guidelines and agreed to schedule her next follow up appointment today.  Corey Skains, am acting as transcriptionist for Abby Potash, PA-C I, Abby Potash, PA-C have reviewed above note and agree with its content

## 2019-12-02 ENCOUNTER — Other Ambulatory Visit: Payer: Self-pay

## 2019-12-02 ENCOUNTER — Other Ambulatory Visit (INDEPENDENT_AMBULATORY_CARE_PROVIDER_SITE_OTHER): Payer: 59

## 2019-12-02 ENCOUNTER — Ambulatory Visit (INDEPENDENT_AMBULATORY_CARE_PROVIDER_SITE_OTHER): Payer: 59 | Admitting: Family Medicine

## 2019-12-02 ENCOUNTER — Encounter: Payer: Self-pay | Admitting: Family Medicine

## 2019-12-02 VITALS — BP 120/80 | HR 88 | Temp 97.5°F | Resp 18 | Ht 65.0 in | Wt 208.4 lb

## 2019-12-02 DIAGNOSIS — R102 Pelvic and perineal pain: Secondary | ICD-10-CM

## 2019-12-02 DIAGNOSIS — F418 Other specified anxiety disorders: Secondary | ICD-10-CM

## 2019-12-02 DIAGNOSIS — E8881 Metabolic syndrome: Secondary | ICD-10-CM | POA: Diagnosis not present

## 2019-12-02 DIAGNOSIS — E785 Hyperlipidemia, unspecified: Secondary | ICD-10-CM

## 2019-12-02 DIAGNOSIS — R739 Hyperglycemia, unspecified: Secondary | ICD-10-CM | POA: Diagnosis not present

## 2019-12-02 DIAGNOSIS — D72829 Elevated white blood cell count, unspecified: Secondary | ICD-10-CM

## 2019-12-02 DIAGNOSIS — E669 Obesity, unspecified: Secondary | ICD-10-CM | POA: Diagnosis not present

## 2019-12-02 DIAGNOSIS — E88819 Insulin resistance, unspecified: Secondary | ICD-10-CM

## 2019-12-02 DIAGNOSIS — E559 Vitamin D deficiency, unspecified: Secondary | ICD-10-CM | POA: Diagnosis not present

## 2019-12-02 DIAGNOSIS — M79672 Pain in left foot: Secondary | ICD-10-CM | POA: Diagnosis not present

## 2019-12-02 DIAGNOSIS — Z0000 Encounter for general adult medical examination without abnormal findings: Secondary | ICD-10-CM

## 2019-12-02 LAB — COMPREHENSIVE METABOLIC PANEL
ALT: 18 U/L (ref 0–35)
AST: 16 U/L (ref 0–37)
Albumin: 4.3 g/dL (ref 3.5–5.2)
Alkaline Phosphatase: 141 U/L — ABNORMAL HIGH (ref 39–117)
BUN: 20 mg/dL (ref 6–23)
CO2: 25 mEq/L (ref 19–32)
Calcium: 9.3 mg/dL (ref 8.4–10.5)
Chloride: 106 mEq/L (ref 96–112)
Creatinine, Ser: 0.5 mg/dL (ref 0.40–1.20)
GFR: 128.29 mL/min (ref >60.00)
Glucose, Bld: 93 mg/dL (ref 70–99)
Potassium: 4.3 mEq/L (ref 3.5–5.1)
Sodium: 138 mEq/L (ref 135–145)
Total Bilirubin: 0.4 mg/dL (ref 0.2–1.2)
Total Protein: 6.8 g/dL (ref 6.0–8.3)

## 2019-12-02 LAB — CBC WITH DIFFERENTIAL/PLATELET
Basophils Absolute: 0.1 10*3/uL (ref 0.0–0.1)
Basophils Relative: 1.1 % (ref 0.0–3.0)
Eosinophils Absolute: 0.1 10*3/uL (ref 0.0–0.7)
Eosinophils Relative: 1.7 % (ref 0.0–5.0)
HCT: 40.9 % (ref 36.0–46.0)
Hemoglobin: 13.6 g/dL (ref 12.0–15.0)
Lymphocytes Relative: 25.2 % (ref 12.0–46.0)
Lymphs Abs: 1.8 10*3/uL (ref 0.7–4.0)
MCHC: 33.2 g/dL (ref 30.0–36.0)
MCV: 89.8 fl (ref 78.0–100.0)
Monocytes Absolute: 0.4 10*3/uL (ref 0.1–1.0)
Monocytes Relative: 6.2 % (ref 3.0–12.0)
Neutro Abs: 4.7 10*3/uL (ref 1.4–7.7)
Neutrophils Relative %: 65.8 % (ref 43.0–77.0)
Platelets: 333 10*3/uL (ref 150.0–400.0)
RBC: 4.55 Mil/uL (ref 3.87–5.11)
RDW: 12.8 % (ref 11.5–15.5)
WBC: 7.1 10*3/uL (ref 4.0–10.5)

## 2019-12-02 LAB — LIPID PANEL
Cholesterol: 161 mg/dL (ref 0–200)
HDL: 44.6 mg/dL (ref >39.00)
LDL Cholesterol: 92 mg/dL (ref 0–99)
NonHDL: 116.86
Total CHOL/HDL Ratio: 4
Triglycerides: 122 mg/dL (ref 0.0–149.0)
VLDL: 24.4 mg/dL (ref 0.0–40.0)

## 2019-12-02 LAB — TSH: TSH: 1.37 u[IU]/mL (ref 0.35–4.50)

## 2019-12-02 LAB — T4, FREE: Free T4: 0.7 ng/dL (ref 0.60–1.60)

## 2019-12-02 LAB — VITAMIN D 25 HYDROXY (VIT D DEFICIENCY, FRACTURES): VITD: 37.98 ng/mL (ref 30.00–100.00)

## 2019-12-02 LAB — HEMOGLOBIN A1C: Hgb A1c MFr Bld: 5.4 % (ref 4.6–6.5)

## 2019-12-02 MED ORDER — ESCITALOPRAM OXALATE 10 MG PO TABS: 10.0000 mg | ORAL_TABLET | Freq: Every day | ORAL | 3 refills | Status: DC

## 2019-12-02 MED ORDER — MOMETASONE FUROATE 50 MCG/ACT NA SUSP: 2.0000 | Freq: Every day | NASAL | 12 refills | Status: DC

## 2019-12-02 MED FILL — MOMETASONE FUROATE 50 MCG S: 50 | 30 days supply | Qty: 17 | Fill #0

## 2019-12-02 NOTE — Patient Instructions (Signed)
Preventive Care 40-54 Years Old, Female Preventive care refers to visits with your health care provider and lifestyle choices that can promote health and wellness. This includes:  A yearly physical exam. This may also be called an annual well check.  Regular dental visits and eye exams.  Immunizations.  Screening for certain conditions.  Healthy lifestyle choices, such as eating a healthy diet, getting regular exercise, not using drugs or products that contain nicotine and tobacco, and limiting alcohol use. What can I expect for my preventive care visit? Physical exam Your health care provider will check your:  Height and weight. This may be used to calculate body mass index (BMI), which tells if you are at a healthy weight.  Heart rate and blood pressure.  Skin for abnormal spots. Counseling Your health care provider may ask you questions about your:  Alcohol, tobacco, and drug use.  Emotional well-being.  Home and relationship well-being.  Sexual activity.  Eating habits.  Work and work environment.  Method of birth control.  Menstrual cycle.  Pregnancy history. What immunizations do I need?  Influenza (flu) vaccine  This is recommended every year. Tetanus, diphtheria, and pertussis (Tdap) vaccine  You may need a Td booster every 10 years. Varicella (chickenpox) vaccine  You may need this if you have not been vaccinated. Zoster (shingles) vaccine  You may need this after age 54. Measles, mumps, and rubella (MMR) vaccine  You may need at least one dose of MMR if you were born in 1957 or later. You may also need a second dose. Pneumococcal conjugate (PCV13) vaccine  You may need this if you have certain conditions and were not previously vaccinated. Pneumococcal polysaccharide (PPSV23) vaccine  You may need one or two doses if you smoke cigarettes or if you have certain conditions. Meningococcal conjugate (MenACWY) vaccine  You may need this if you  have certain conditions. Hepatitis A vaccine  You may need this if you have certain conditions or if you travel or work in places where you may be exposed to hepatitis A. Hepatitis B vaccine  You may need this if you have certain conditions or if you travel or work in places where you may be exposed to hepatitis B. Haemophilus influenzae type b (Hib) vaccine  You may need this if you have certain conditions. Human papillomavirus (HPV) vaccine  If recommended by your health care provider, you may need three doses over 6 months. You may receive vaccines as individual doses or as more than one vaccine together in one shot (combination vaccines). Talk with your health care provider about the risks and benefits of combination vaccines. What tests do I need? Blood tests  Lipid and cholesterol levels. These may be checked every 5 years, or more frequently if you are over 54 years old.  Hepatitis C test.  Hepatitis B test. Screening  Lung cancer screening. You may have this screening every year starting at age 54 if you have a 30-pack-year history of smoking and currently smoke or have quit within the past 15 years.  Colorectal cancer screening. All adults should have this screening starting at age 54 and continuing until age 75. Your health care provider may recommend screening at age 45 if you are at increased risk. You will have tests every 1-10 years, depending on your results and the type of screening test.  Diabetes screening. This is done by checking your blood sugar (glucose) after you have not eaten for a while (fasting). You may have this   done every 1-3 years.  Mammogram. This may be done every 1-2 years. Talk with your health care provider about when you should start having regular mammograms. This may depend on whether you have a family history of breast cancer.  BRCA-related cancer screening. This may be done if you have a family history of breast, ovarian, tubal, or peritoneal  cancers.  Pelvic exam and Pap test. This may be done every 3 years starting at age 56. Starting at age 73, this may be done every 5 years if you have a Pap test in combination with an HPV test. Other tests  Sexually transmitted disease (STD) testing.  Bone density scan. This is done to screen for osteoporosis. You may have this scan if you are at high risk for osteoporosis. Follow these instructions at home: Eating and drinking  Eat a diet that includes fresh fruits and vegetables, whole grains, lean protein, and low-fat dairy.  Take vitamin and mineral supplements as recommended by your health care provider.  Do not drink alcohol if: ? Your health care provider tells you not to drink. ? You are pregnant, may be pregnant, or are planning to become pregnant.  If you drink alcohol: ? Limit how much you have to 0-1 drink a day. ? Be aware of how much alcohol is in your drink. In the [place] one drink equals one 12 oz bottle of beer (355 mL), one 5 oz glass of wine (148 mL), or one 1 oz glass of hard liquor (44 mL). Lifestyle  Take daily care of your teeth and gums.  Stay active. Exercise for at least 30 minutes on 5 or more days each week.  Do not use any products that contain nicotine or tobacco, such as cigarettes, e-cigarettes, and chewing tobacco. If you need help quitting, ask your health care provider.  If you are sexually active, practice safe sex. Use a condom or other form of birth control (contraception) in order to prevent pregnancy and STIs (sexually transmitted infections).  If told by your health care provider, take low-dose aspirin daily starting at age 33. What's next?  Visit your health care provider once a year for a well check visit.  Ask your health care provider how often you should have your eyes and teeth checked.  Stay up to date on all vaccines. This information is not intended to replace advice given to you by your health care provider. Make sure you  discuss any questions you have with your health care provider. Document Released: 01/05/2016 Document Revised: 08/20/2018 Document Reviewed: 08/20/2018 Elsevier Patient Education  2020 [employer].

## 2019-12-02 NOTE — Progress Notes (Signed)
Subjective:    Patient ID: Hailey Noble, female    DOB: 13-Jun-1966, 53 y.o.   MRN: [id number]  No chief complaint on file.   HPI Patient is in today for annual preventative exam and follow up on chronic medical concerns including vitamin D Deficiency, hyperlipidemia, hyperglycemia and more. 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 She did have a TAH SBO this past October and has healed up well. Is trying to eat well and stay active. Denies CP/palp/SOB/HA/congestion/fevers/GI or GU c/o. Taking meds as prescribed  Past Medical History:  Diagnosis Date  . Allergic state 12/08/2016  . Allergy   . Anxiety   . Back pain    " i just have a weak loer back"   . Fibroids   . GERD (gastroesophageal reflux disease)   . Hyperlipidemia 06/09/2017  . Insulin resistance   . Joint pain   . Knee pain, right 03/10/2017  . Plantar fasciitis   . PONV (postoperative nausea and vomiting)    just nausea   . Shortness of breath    not only if  i exert myselg   . Sinusitis   . Vitamin D deficiency 11/22/2016  . Vitamin D deficiency     Past Surgical History:  Procedure Laterality Date  . ABDOMINAL HYSTERECTOMY  09/2019   [place] spo  . COLONOSCOPY  02-2009   with Henrene Pastor normal  . CYSTOSCOPY N/A 10/12/2019   Procedure: CYSTOSCOPY;  Surgeon: Sherlyn Hay, DO;  Location: [facility];  Service: Gynecology;  Laterality: N/A;  . ESOPHAGOGASTRODUODENOSCOPY  2012  . GANGLION CYST EXCISION Right    right- wrist   . NASAL SEPTUM SURGERY    . NASAL SINUS SURGERY    . TMJ ARTHROSCOPY     left  . TOTAL LAPAROSCOPIC HYSTERECTOMY WITH BILATERAL SALPINGO OOPHORECTOMY Bilateral 10/12/2019   Procedure: TOTAL LAPAROSCOPIC HYSTERECTOMY WITH BILATERAL SALPINGO OOPHORECTOMY, Lysis Pelvic Adhseions ;  Surgeon: Sherlyn Hay, DO;  Location: [facility];  Service: Gynecology;  Laterality:  Bilateral;    Family History  Problem Relation Age of Onset  . Hypertension Father   . Diabetes Father   . Hyperlipidemia Father   . Breast cancer Mother 62  . Cancer Mother        breast  . Thyroid disease Mother   . Sleep apnea Mother   . Colon cancer Paternal Grandfather        died at age 24  . Cancer Paternal Grandfather        GI CA  . Obesity Sister   . Diabetes Sister   . Hypertension Sister   . Heart disease Maternal Grandmother        MI  . Cancer Maternal Grandfather        lung, smoker  . Diabetes Maternal Grandfather   . Diabetes Paternal Grandmother   . Heart disease Paternal Grandmother   . Pancreatitis Sister   . Esophageal cancer Neg Hx   . Stomach cancer Neg Hx   . Stroke Neg Hx   . Colon polyps Neg Hx   . Rectal cancer Neg Hx   . Pancreatic cancer Neg Hx     Social History   Socioeconomic History  . Marital status: Single    Spouse name: Not on file  . Number of children: Not on file  . Years of education: Not on file  . Highest education  level: Not on file  Occupational History  . Occupation: Programmer, multimedia: [employer]  Tobacco Use  . Smoking status: Never Smoker  . Smokeless tobacco: Never Used  Substance and Sexual Activity  . Alcohol use: Yes    Alcohol/week: 0.0 standard drinks    Comment: rare  . Drug use: No  . Sexual activity: Yes    Birth control/protection: I.U.D.  Other Topics Concern  . 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 Works at [employer]. No dietary restrictions. Lives with dog   Social Determinants of Health   Financial Resource Strain:   . Difficulty of Paying Living Expenses: Not on file  Food Insecurity:   . Worried About Charity fundraiser in the Last Year: Not on file  . Ran Out of Food in the Last Year: Not on file  Transportation Needs:   . Lack of Transportation (Medical): Not on file  . Lack of Transportation  (Non-Medical): Not on file  Physical Activity:   . Days of Exercise per Week: Not on file  . Minutes of Exercise per Session: Not on file  Stress:   . Feeling of Stress : Not on file  Social Connections:   . Frequency of Communication with Friends and Family: Not on file  . Frequency of Social Gatherings with Friends and Family: Not on file  . Attends Religious Services: Not on file  . Active Member of Clubs or Organizations: Not on file  . Attends Archivist Meetings: Not on file  . Marital Status: Not on file  Intimate Partner Violence:   . Fear of Current or Ex-Partner: Not on file  . Emotionally Abused: Not on file  . Physically Abused: Not on file  . Sexually Abused: Not on file    Outpatient Medications Prior to Visit  Medication Sig Dispense Refill  . acetaminophen (TYLENOL) 500 MG tablet Take 2 tablets (1,000 mg total) by mouth every 6 (six) hours as needed for moderate pain. 30 tablet 0  . Artificial Tear Solution (SOOTHE XP) SOLN Place 1 drop into both eyes daily as needed (dry eyes).    . Calcium Carbonate-Vitamin D (CALTRATE 600+D PO) Take 2 tablets by mouth daily.    . Cholecalciferol (VITAMIN D) 50 MCG (2000 UT) tablet Take 4,000 Units by mouth daily.    Marland Kitchen ibuprofen (ADVIL) 800 MG tablet Take 1 tablet (800 mg total) by mouth every 8 (eight) hours. 40 tablet 1  . Krill Oil 500 MG CAPS Take 500 mg by mouth daily.    . metFORMIN (GLUCOPHAGE) 500 MG tablet Take 1 tablet (500 mg total) by mouth 2 (two) times daily with a meal. 60 tablet 0  . Multiple Vitamin (MULTIVITAMIN WITH MINERALS) TABS tablet Take 1 tablet by mouth daily.    . vitamin C (ASCORBIC ACID) 500 MG tablet Take 500 mg by mouth daily.    Marland Kitchen escitalopram (LEXAPRO) 10 MG tablet TAKE 1 TABLET BY MOUTH ONCE DAILY (Patient taking differently: Take 15 mg by mouth at bedtime. ) 90 tablet 3  . mometasone (NASONEX) 50 MCG/ACT nasal spray Place 2 sprays into the nose daily. (Patient taking differently: Place 2  sprays into the nose daily as needed (allergies). ) 17 g 6   No facility-administered medications prior to visit.    Allergies  Allergen Reactions  . Coconut Fatty Acids Hives  . Codeine   .  Mango Flavor Hives  . [name] Itching and Swelling  . Oxycodone-Acetaminophen     rash on face and head, itching  . Tussionex Pennkinetic Er [Hydrocod Polst-Cpm Polst Er]     rash on face and head, itching  . Ultram [Tramadol] Other (See Comments)    Dizzy and low blood pressure.     Review of Systems  Constitutional: Negative for chills, fever and malaise/fatigue.  HENT: Negative for congestion and hearing loss.   Eyes: Negative for discharge.  Respiratory: Negative for cough, sputum production and shortness of breath.   Cardiovascular: Negative for chest pain, palpitations and leg swelling.  Gastrointestinal: Negative for abdominal pain, blood in stool, constipation, diarrhea, heartburn, nausea and vomiting.  Genitourinary: Negative for dysuria, frequency, hematuria and urgency.  Musculoskeletal: Negative for back pain, falls and myalgias.  Skin: Negative for rash.  Neurological: Negative for dizziness, sensory change, loss of consciousness, weakness and headaches.  Endo/Heme/Allergies: Negative for environmental allergies. Does not bruise/bleed easily.  Psychiatric/Behavioral: Positive for depression. Negative for suicidal ideas. The patient is nervous/anxious. The patient does not have insomnia.        Objective:    Physical Exam Constitutional:      General: She is not in acute distress.    Appearance: She is well-developed.  HENT:     Head: Normocephalic and atraumatic.  Eyes:     Conjunctiva/sclera: Conjunctivae normal.  Neck:     Thyroid: No thyromegaly.  Cardiovascular:     Rate and Rhythm: Normal rate and regular rhythm.     Heart sounds: Normal heart sounds. No murmur.  Pulmonary:     Effort: Pulmonary effort is normal. No respiratory distress.     Breath sounds:  Normal breath sounds.  Abdominal:     General: Bowel sounds are normal. There is no distension.     Palpations: Abdomen is soft. There is no mass.     Tenderness: There is no abdominal tenderness.  Musculoskeletal:     Cervical back: Neck supple.  Lymphadenopathy:     Cervical: No cervical adenopathy.  Skin:    General: Skin is warm and dry.  Neurological:     Mental Status: She is alert and oriented to person, place, and time.  Psychiatric:        Behavior: Behavior normal.     BP 120/80 (BP Location: Left Arm, Patient Position: Sitting, Cuff Size: Normal)   Pulse 88   Temp (!) 97.5 F (36.4 C) (Temporal)   Resp 18   Ht 5\' 5"  (1.651 m)   Wt 208 lb 6.4 oz (94.5 kg)   LMP 11/22/2018 (Approximate)   SpO2 94%   BMI 34.68 kg/m  Wt Readings from Last 3 Encounters:  12/02/19 208 lb 6.4 oz (94.5 kg)  12/01/19 208 lb (94.3 kg)  10/12/19 212 lb 7 oz (96.4 kg)    Diabetic Foot Exam - Simple   No data filed     Lab Results  Component Value Date   WBC 7.1 12/02/2019   HGB 13.6 12/02/2019   HCT 40.9 12/02/2019   PLT 333.0 12/02/2019   GLUCOSE 93 12/02/2019   CHOL 161 12/02/2019   TRIG 122.0 12/02/2019   HDL 44.60 12/02/2019   LDLCALC 92 12/02/2019   ALT 18 12/02/2019   AST 16 12/02/2019   NA 138 12/02/2019   K 4.3 12/02/2019   CL 106 12/02/2019   CREATININE 0.50 12/02/2019   BUN 20 12/02/2019   CO2 25 12/02/2019   TSH 1.37 12/02/2019  HGBA1C 5.4 12/02/2019    Lab Results  Component Value Date   TSH 1.37 12/02/2019   Lab Results  Component Value Date   WBC 7.1 12/02/2019   HGB 13.6 12/02/2019   HCT 40.9 12/02/2019   MCV 89.8 12/02/2019   PLT 333.0 12/02/2019   Lab Results  Component Value Date   NA 138 12/02/2019   K 4.3 12/02/2019   CO2 25 12/02/2019   GLUCOSE 93 12/02/2019   BUN 20 12/02/2019   CREATININE 0.50 12/02/2019   BILITOT 0.4 12/02/2019   ALKPHOS 141 (H) 12/02/2019   AST 16 12/02/2019   ALT 18 12/02/2019   PROT 6.8 12/02/2019    ALBUMIN 4.3 12/02/2019   CALCIUM 9.3 12/02/2019   ANIONGAP 8 10/08/2019   GFR 128.29 12/02/2019   Lab Results  Component Value Date   CHOL 161 12/02/2019   Lab Results  Component Value Date   HDL 44.60 12/02/2019   Lab Results  Component Value Date   LDLCALC 92 12/02/2019   Lab Results  Component Value Date   TRIG 122.0 12/02/2019   Lab Results  Component Value Date   CHOLHDL 4 12/02/2019   Lab Results  Component Value Date   HGBA1C 5.4 12/02/2019       Assessment & Plan:   Problem List Items Addressed This Visit    Preventative health care    Patient encouraged to maintain heart healthy diet, regular exercise, adequate sleep. Consider daily probiotics. Take medications as prescribed. Labs ordered and reviewed. S/p TAH spo in October so no pap needed. Mgm last at [facility] Dec 2019 records requested. Next one already scheduled. Colonoscopy UTD.      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 Tried 15 mg and was jittery, if she decides she needs to try can to 10 mg in am and 5 in pm and see if she tolerates that better, she will let us know      Relevant Medications   escitalopram (LEXAPRO) 10 MG tablet   Obesity (BMI 30-39.9)    Encouraged DASH diet, decrease po intake and increase exercise as tolerated. Needs 7-8 hours of sleep nightly. Avoid trans fats, eat small, frequent meals every 4-5 hours with lean proteins, complex carbs and healthy fats. Minimize simple carbs, following with healthy weight and wellness and doing well      Vitamin D deficiency - Primary    Supplement and monitor.      Relevant Orders   VITAMIN D (Completed)   Hyperlipidemia    Encouraged heart healthy diet, increase exercise, avoid trans fats, consider a krill oil cap daily      Relevant Orders   CMP (Completed)   Lipid panel (Completed)   TSH (Completed)    T4, free (Completed)   Insulin resistance   Relevant Orders   TSH (Completed)   T4, free (Completed)   Pelvic pain    Feels much better now s/p TAH SPO for fibroids and endometriosis, no post op complications      Leukocytosis    Elevated when she had her hysterectomy in October will check cbc with diff to confirm has returned to normal      Relevant Orders   CBC with Differential/Platelet (Completed)   Hyperglycemia    hgba1c acceptable, minimize simple carbs. Increase exercise as tolerated. Check insulin levels  Relevant Orders   Insulin, random (Completed)   Pain of left heel    Denies any injury but was better when she was on FMLA encouraged compressive socks and stretching already as orthotics, if persists consider referral to sports medicine         I have changed Scarleth I. Mcgirr's escitalopram. I am also having her maintain her Calcium Carbonate-Vitamin D (CALTRATE 600+D PO), Krill Oil, Soothe XP, vitamin C, Vitamin D, multivitamin with minerals, acetaminophen, ibuprofen, metFORMIN, and mometasone.  Meds ordered this encounter  Medications  . mometasone (NASONEX) 50 MCG/ACT nasal spray    Sig: Place 2 sprays into the nose daily.    Dispense:  17 g    Refill:  12  . escitalopram (LEXAPRO) 10 MG tablet    Sig: Take 1 tablet (10 mg total) by mouth daily.    Dispense:  90 tablet    Refill:  3     Hailey Homans, MD

## 2019-12-02 NOTE — Assessment & Plan Note (Signed)
Patient encouraged to maintain heart healthy diet, regular exercise, adequate sleep. Consider daily probiotics. Take medications as prescribed. Labs ordered and reviewed. S/p TAH spo in October so no pap needed. Mgm last at [facility] Dec 2019 records requested. Next one already scheduled. Colonoscopy UTD.

## 2019-12-02 NOTE — Assessment & Plan Note (Signed)
Elevated when she had her hysterectomy in October will check cbc with diff to confirm has returned to normal

## 2019-12-02 NOTE — Assessment & Plan Note (Signed)
Feels much better now s/p TAH SPO for fibroids and endometriosis, no post op complications

## 2019-12-02 NOTE — Assessment & Plan Note (Signed)
Denies any injury but was better when she was on FMLA encouraged compressive socks and stretching already as orthotics, if persists consider referral to sports medicine

## 2019-12-02 NOTE — Assessment & Plan Note (Signed)
Encouraged heart healthy diet, increase exercise, avoid trans fats, consider a krill oil cap daily 

## 2019-12-02 NOTE — Assessment & Plan Note (Signed)
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 Tried 15 mg and was jittery, if she decides she needs to try can to 10 mg in am and 5 in pm and see if she tolerates that better, she will let us know

## 2019-12-02 NOTE — Assessment & Plan Note (Signed)
hgba1c acceptable, minimize simple carbs. Increase exercise as tolerated. Check insulin levels

## 2019-12-02 NOTE — Assessment & Plan Note (Signed)
Encouraged DASH diet, decrease po intake and increase exercise as tolerated. Needs 7-8 hours of sleep nightly. Avoid trans fats, eat small, frequent meals every 4-5 hours with lean proteins, complex carbs and healthy fats. Minimize simple carbs, following with healthy weight and wellness and doing well

## 2019-12-03 LAB — INSULIN, RANDOM: Insulin: 7.2 u[IU]/mL

## 2019-12-06 ENCOUNTER — Encounter: Payer: Self-pay | Admitting: Family Medicine

## 2019-12-06 NOTE — Assessment & Plan Note (Signed)
Supplement and monitor 

## 2019-12-14 DIAGNOSIS — Z803 Family history of malignant neoplasm of breast: Secondary | ICD-10-CM | POA: Diagnosis not present

## 2019-12-14 DIAGNOSIS — Z1231 Encounter for screening mammogram for malignant neoplasm of breast: Secondary | ICD-10-CM | POA: Diagnosis not present

## 2019-12-27 ENCOUNTER — Ambulatory Visit (INDEPENDENT_AMBULATORY_CARE_PROVIDER_SITE_OTHER): Payer: 59 | Admitting: Physician Assistant

## 2019-12-27 ENCOUNTER — Encounter (INDEPENDENT_AMBULATORY_CARE_PROVIDER_SITE_OTHER): Payer: Self-pay

## 2020-02-02 ENCOUNTER — Other Ambulatory Visit: Payer: Self-pay

## 2020-02-02 ENCOUNTER — Ambulatory Visit (INDEPENDENT_AMBULATORY_CARE_PROVIDER_SITE_OTHER): Payer: 59 | Admitting: Physician Assistant

## 2020-02-02 ENCOUNTER — Encounter (INDEPENDENT_AMBULATORY_CARE_PROVIDER_SITE_OTHER): Payer: Self-pay | Admitting: Physician Assistant

## 2020-02-02 VITALS — BP 136/74 | HR 84 | Temp 98.0°F | Ht 65.0 in | Wt 210.0 lb

## 2020-02-02 DIAGNOSIS — E8881 Metabolic syndrome: Secondary | ICD-10-CM | POA: Diagnosis not present

## 2020-02-02 DIAGNOSIS — Z6834 Body mass index (BMI) 34.0-34.9, adult: Secondary | ICD-10-CM

## 2020-02-02 DIAGNOSIS — E669 Obesity, unspecified: Secondary | ICD-10-CM

## 2020-02-02 NOTE — Progress Notes (Signed)
Chief Complaint:   [place] is here to discuss her progress with her obesity treatment plan along with follow-up of her obesity related diagnoses. Pailey is on the Category 2 Plan/journaling and states she is following her eating plan approximately 85-90% of the time. Zyniyah states she is walking 2 miles on the treadmill 3 times per week.  Today's visit was #: 78 Starting weight: 212 lbs Starting date: 10/06/2017 Today's weight: 210 lbs Today's date: 02/02/2020 Total lbs lost to date: 2 Total lbs lost since last in-office visit: 0  Interim History: Yassmin states that she just bought a treadmill and has been walking. She is getting bored with eating the same foods and is looking for different ideas.  Subjective:   Insulin resistance. Dayah has a diagnosis of insulin resistance based on her elevated fasting insulin level >5. She continues to work on diet and exercise to decrease her risk of diabetes. Juanell is on metformin. No nausea, vomiting, or diarrhea. Last insulin was 7.2 on 12/02/2019. She is exercising regularly.  Lab Results  Component Value Date   INSULIN 9.3 12/30/2018   INSULIN 11.1 09/21/2018   INSULIN 8.7 04/30/2018   INSULIN 8.8 10/06/2017   Lab Results  Component Value Date   HGBA1C 5.4 12/02/2019   Assessment/Plan:   Insulin resistance. Quanesha will continue to work on weight loss, exercise, and decreasing simple carbohydrates to help decrease the risk of diabetes. Aaron agreed to follow-up with [name] as directed to closely monitor her progress. She will continue her medications as prescribed.  Class 1 obesity with serious comorbidity and body mass index (BMI) of 34.0 to 34.9 in adult, unspecified obesity type.  Caitriona is currently in the action stage of change. As such, her goal is to continue with weight loss efforts. She has agreed to the Category 3 Plan and journal 1500 calories and 95 grams of protein daily.   Exercise goals: For substantial  health benefits, adults should do at least 150 minutes (2 hours and 30 minutes) a week of moderate-intensity, or 75 minutes (1 hour and 15 minutes) a week of vigorous-intensity aerobic physical activity, or an equivalent combination of moderate- and vigorous-intensity aerobic activity. Aerobic activity should be performed in episodes of at least 10 minutes, and preferably, it should be spread throughout the week.  Behavioral modification strategies: increasing lean protein intake and meal planning and cooking strategies.  Charish has agreed to follow-up with our clinic in 2-3 weeks. She was informed of the importance of frequent follow-up visits to maximize her success with intensive lifestyle modifications for her multiple health conditions.   Objective:   Blood pressure 136/74, pulse 84, temperature 98 F (36.7 C), temperature source Oral, height 5\' 5"  (1.651 m), weight 210 lb (95.3 kg), last menstrual period 11/22/2018, SpO2 96 %. Body mass index is 34.95 kg/m.  General: Cooperative, alert, well developed, in no acute distress. HEENT: Conjunctivae and lids unremarkable. Cardiovascular: Regular rhythm.  Lungs: Normal work of breathing. Neurologic: No focal deficits.   Lab Results  Component Value Date   CREATININE 0.50 12/02/2019   BUN 20 12/02/2019   NA 138 12/02/2019   K 4.3 12/02/2019   CL 106 12/02/2019   CO2 25 12/02/2019   Lab Results  Component Value Date   ALT 18 12/02/2019   AST 16 12/02/2019   ALKPHOS 141 (H) 12/02/2019   BILITOT 0.4 12/02/2019   Lab Results  Component Value Date   HGBA1C 5.4 12/02/2019  HGBA1C 5.3 10/08/2019   HGBA1C 5.4 06/16/2019   HGBA1C 5.4 12/30/2018   HGBA1C 5.4 09/21/2018   Lab Results  Component Value Date   INSULIN 9.3 12/30/2018   INSULIN 11.1 09/21/2018   INSULIN 8.7 04/30/2018   INSULIN 8.8 10/06/2017   Lab Results  Component Value Date   TSH 1.37 12/02/2019   Lab Results  Component Value Date   CHOL 161 12/02/2019    HDL 44.60 12/02/2019   LDLCALC 92 12/02/2019   TRIG 122.0 12/02/2019   CHOLHDL 4 12/02/2019   Lab Results  Component Value Date   WBC 7.1 12/02/2019   HGB 13.6 12/02/2019   HCT 40.9 12/02/2019   MCV 89.8 12/02/2019   PLT 333.0 12/02/2019   Lab Results  Component Value Date   FERRITIN 50 11/09/2008   Attestation Statements:   Reviewed by clinician on day of visit: allergies, medications, problem list, medical history, surgical history, family history, social history, and previous encounter notes.  Time spent on visit including pre-visit chart review and post-visit care was 32 minutes.   IMichaelene Song, am acting as transcriptionist for Abby Potash, PA-C   I have reviewed the above documentation for accuracy and completeness, and I agree with the above. Abby Potash, PA-C

## 2020-02-09 ENCOUNTER — Encounter: Payer: Self-pay | Admitting: Family Medicine

## 2020-02-09 DIAGNOSIS — Z1389 Encounter for screening for other disorder: Secondary | ICD-10-CM | POA: Diagnosis not present

## 2020-02-09 DIAGNOSIS — Z130 Encounter for screening for diseases of the blood and blood-forming organs and certain disorders involving the immune mechanism: Secondary | ICD-10-CM | POA: Diagnosis not present

## 2020-02-09 DIAGNOSIS — Z780 Asymptomatic menopausal state: Secondary | ICD-10-CM | POA: Diagnosis not present

## 2020-02-09 DIAGNOSIS — Z6833 Body mass index (BMI) 33.0-33.9, adult: Secondary | ICD-10-CM | POA: Diagnosis not present

## 2020-02-09 DIAGNOSIS — Z01419 Encounter for gynecological examination (general) (routine) without abnormal findings: Secondary | ICD-10-CM | POA: Diagnosis not present

## 2020-02-10 ENCOUNTER — Other Ambulatory Visit: Payer: Self-pay | Admitting: Family Medicine

## 2020-02-10 DIAGNOSIS — M25572 Pain in left ankle and joints of left foot: Secondary | ICD-10-CM

## 2020-02-10 DIAGNOSIS — M79672 Pain in left foot: Secondary | ICD-10-CM

## 2020-02-16 ENCOUNTER — Ambulatory Visit: Payer: Self-pay

## 2020-02-16 ENCOUNTER — Encounter: Payer: Self-pay | Admitting: Family Medicine

## 2020-02-16 ENCOUNTER — Ambulatory Visit: Payer: 59 | Admitting: Family Medicine

## 2020-02-16 ENCOUNTER — Other Ambulatory Visit: Payer: Self-pay

## 2020-02-16 VITALS — Ht 65.0 in | Wt 209.0 lb

## 2020-02-16 DIAGNOSIS — M7672 Peroneal tendinitis, left leg: Secondary | ICD-10-CM | POA: Insufficient documentation

## 2020-02-16 DIAGNOSIS — G8929 Other chronic pain: Secondary | ICD-10-CM

## 2020-02-16 MED ORDER — PENNSAID 2 % EX SOLN: 1.0000 "application " | Freq: Two times a day (BID) | CUTANEOUS | 3 refills | Status: DC

## 2020-02-16 NOTE — Patient Instructions (Signed)
Nice to meet you Please try ice  Please try compression  Please try the ibuprofen for 3-4 days straight and then as needed  Please try the exercises   Please send me a message in MyChart with any questions or updates.  Please see me back in 4 weeks.   --Dr. Raeford Razor

## 2020-02-16 NOTE — Assessment & Plan Note (Signed)
Has chronic changes of the tendons as well as an effusion. -Has ibuprofen she can use. -Counseled on compression. -Counseled on home exercise therapy and supportive care. -If no improvement can consider custom orthotics, nitro or physical therapy.  Could consider an injection and a cam walker.

## 2020-02-16 NOTE — Progress Notes (Signed)
Hailey Noble - 54 y.o. female MRN [id number]  Date of birth: 29-Nov-1966  SUBJECTIVE:  Including CC & ROS.  Chief Complaint  Patient presents with  . Ankle Pain    left ankle    Hailey Noble is a 54 y.o. female that is presenting with pain over the lateral aspect of the ankle.  This is been acute on chronic.  Almost occurring for about a year.  She notices the pain when she walks or when she stands.  Does not endorse any specific injury.  She did wear shoes that seem to exacerbate some pain in the area.  Has not tried any modalities to improve the symptoms.   Review of Systems See HPI   HISTORY: Past Medical, Surgical, Social, and Family History Reviewed & Updated per EMR.   Pertinent Historical Findings include:  Past Medical History:  Diagnosis Date  . Allergic state 12/08/2016  . Allergy   . Anxiety   . Back pain    " i just have a weak loer back"   . Fibroids   . GERD (gastroesophageal reflux disease)   . Hyperlipidemia 06/09/2017  . Insulin resistance   . Joint pain   . Knee pain, right 03/10/2017  . Plantar fasciitis   . PONV (postoperative nausea and vomiting)    just nausea   . Shortness of breath    not only if  i exert myselg   . Sinusitis   . Vitamin D deficiency 11/22/2016  . Vitamin D deficiency     Past Surgical History:  Procedure Laterality Date  . ABDOMINAL HY[place]CTOMY  09/2019   [place]  . COLONOSCOPY  02-2009   with Henrene Pastor normal  . CYSTOSCOPY N/A 10/12/2019   Procedure: CYSTOSCOPY;  Surgeon: Sherlyn Hay, DO;  Location: [facility];  Service: Gynecology;  Laterality: N/A;  . ESOPHAGOGASTRODUODENOSCOPY  2012  . GANGLION CYST EXCISION Right    right- wrist   . NASAL SEPTUM SURGERY    . NASAL SINUS SURGERY    . TMJ ARTHROSCOPY     left  . TOTAL LAPAROSCOPIC HYSTERECTOMY WITH BILATERAL SALPINGO OOPHORECTOMY Bilateral 10/12/2019   Procedure: TOTAL LAPAROSCOPIC HYSTERECTOMY WITH BILATERAL SALPINGO OOPHORECTOMY, Lysis Pelvic  Adhseions ;  Surgeon: Sherlyn Hay, DO;  Location: [facility];  Service: Gynecology;  Laterality: Bilateral;    Family History  Problem Relation Age of Onset  . Hypertension Father   . Diabetes Father   . Hyperlipidemia Father   . Breast cancer Mother 58  . Cancer Mother        breast  . Thyroid disease Mother   . Sleep apnea Mother   . Colon cancer Paternal Grandfather        died at age 74  . Cancer Paternal Grandfather        GI CA  . Obesity Sister   . Diabetes Sister   . Hypertension Sister   . Heart disease Maternal Grandmother        MI  . Cancer Maternal Grandfather        lung, smoker  . Diabetes Maternal Grandfather   . Diabetes Paternal Grandmother   . Heart disease Paternal Grandmother   . Pancreatitis Sister   . Esophageal cancer Neg Hx   . Stomach cancer Neg Hx   . Stroke Neg Hx   . Colon polyps Neg Hx   . Rectal cancer Neg Hx   . Pancreatic cancer Neg Hx  Social History   Socioeconomic History  . Marital status: Single    Spouse name: Not on file  . Number of children: Not on file  . Years of education: Not on file  . Highest education level: Not on file  Occupational History  . Occupation: Programmer, multimedia: [employer]  Tobacco Use  . Smoking status: Never Smoker  . Smokeless tobacco: Never Used  Substance and Sexual Activity  . Alcohol use: Yes    Alcohol/week: 0.0 standard drinks    Comment: rare  . Drug use: No  . Sexual activity: Yes    Birth control/protection: I.U.D.  Other Topics Concern  . 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 Works at [employer]. No dietary restrictions. Lives with dog   Social Determinants of Health   Financial Resource Strain:   . Difficulty of Paying Living Expenses: Not on file  Food Insecurity:   . Worried About Charity fundraiser in the Last Year: Not on file  . Ran Out of Food in the Last Year:  Not on file  Transportation Needs:   . Lack of Transportation (Medical): Not on file  . Lack of Transportation (Non-Medical): Not on file  Physical Activity:   . Days of Exercise per Week: Not on file  . Minutes of Exercise per Session: Not on file  Stress:   . Feeling of Stress : Not on file  Social Connections:   . Frequency of Communication with Friends and Family: Not on file  . Frequency of Social Gatherings with Friends and Family: Not on file  . Attends Religious Services: Not on file  . Active Member of Clubs or Organizations: Not on file  . Attends Archivist Meetings: Not on file  . Marital Status: Not on file  Intimate Partner Violence:   . Fear of Current or Ex-Partner: Not on file  . Emotionally Abused: Not on file  . Physically Abused: Not on file  . Sexually Abused: Not on file     PHYSICAL EXAM:  VS: Ht 5\' 5"  (1.651 m)   Wt 209 lb (94.8 kg)   LMP 11/22/2018 (Approximate)   BMI 34.78 kg/m  Physical Exam Gen: NAD, alert, cooperative with exam, well-appearing MSK:  Left ankle: Mild swelling just distal to the fibula. Normal range of motion. Normal strength resistance. Some tenderness palpation of the peroneal tendons. Neurovascularly intact   Limited ultrasound: Left ankle:  No effusion noted in the ankle joint. Normal-appearing distal fibula. Peroneal tendons with mucus changes to suggest chronic tendinopathy as well as an effusion encircling each tendon distally.  There is hyperemia in the area as well.  Summary: Findings would suggest peroneal tendinopathy.  Ultrasound and interpretation by Clearance Coots, MD    ASSESSMENT & PLAN:   Peroneal tendinitis of left lower leg Has chronic changes of the tendons as well as an effusion. -Has ibuprofen she can use. -Counseled on compression. -Counseled on home exercise therapy and supportive care. -If no improvement can consider custom orthotics, nitro or physical therapy.  Could consider an  injection and a cam walker.

## 2020-02-18 DIAGNOSIS — H524 Presbyopia: Secondary | ICD-10-CM | POA: Diagnosis not present

## 2020-02-23 ENCOUNTER — Ambulatory Visit (INDEPENDENT_AMBULATORY_CARE_PROVIDER_SITE_OTHER): Payer: 59 | Admitting: Physician Assistant

## 2020-02-23 ENCOUNTER — Other Ambulatory Visit: Payer: Self-pay

## 2020-02-23 VITALS — BP 109/72 | HR 82 | Temp 98.0°F | Ht 65.0 in | Wt 211.0 lb

## 2020-02-23 DIAGNOSIS — Z6835 Body mass index (BMI) 35.0-35.9, adult: Secondary | ICD-10-CM | POA: Diagnosis not present

## 2020-02-23 DIAGNOSIS — E8881 Metabolic syndrome: Secondary | ICD-10-CM | POA: Diagnosis not present

## 2020-02-23 DIAGNOSIS — Z9189 Other specified personal risk factors, not elsewhere classified: Secondary | ICD-10-CM | POA: Diagnosis not present

## 2020-02-23 DIAGNOSIS — E559 Vitamin D deficiency, unspecified: Secondary | ICD-10-CM

## 2020-02-23 MED ORDER — METFORMIN HCL 500 MG PO TABS: 500.0000 mg | ORAL_TABLET | Freq: Every day | ORAL | 0 refills | Status: DC

## 2020-02-23 MED FILL — metFORMIN HCL 500 MG TABS: 500 | 90 days supply | Qty: 90 | Fill #0

## 2020-02-23 NOTE — Progress Notes (Signed)
Chief Complaint:   OBESITY Hailey Noble is here to discuss her progress with her obesity treatment plan along with follow-up of her obesity related diagnoses. Hailey Noble is on the Category 3 Plan and states she is following her eating plan approximately 90% of the time. Hailey Noble states she is walking for 30 minutes 5 times per week.  Today's visit was #: 89 Starting weight: 212 lbs Starting date: 10/06/2017 Today's weight: 211 lbs Today's date: 02/23/2020 Total lbs lost to date: 1 lb Total lbs lost since last in-office visit: 0  Interim History: Hailey Noble states that she is sometimes not getting in all of her protein.  She wants to start swimming again.  Subjective:   1. Insulin resistance Hailey Noble has a diagnosis of insulin resistance based on her elevated fasting insulin level >5. She continues to work on diet and exercise to decrease her risk of diabetes.  She is taking metformin. No nausea, vomiting, or diarrhea.  Lab Results  Component Value Date   INSULIN 9.3 12/30/2018   INSULIN 11.1 09/21/2018   INSULIN 8.7 04/30/2018   INSULIN 8.8 10/06/2017   Lab Results  Component Value Date   HGBA1C 5.4 12/02/2019   2. Vitamin D deficiency Hailey Noble's Vitamin D level was 37.98 on 12/02/2019. She is currently taking vit D. She denies nausea, vomiting or muscle weakness.  3. At risk for diabetes mellitus Hailey Noble is at higher than average risk for developing diabetes due to her obesity.   Assessment/Plan:   1. Insulin resistance Hailey Noble will continue to work on weight loss, exercise, and decreasing simple carbohydrates to help decrease the risk of diabetes. Hailey Noble agreed to follow-up with [name] as directed to closely monitor her progress.  Will decrease metformin to 500 mg once daily. - metFORMIN (GLUCOPHAGE) 500 MG tablet; Take 1 tablet (500 mg total) by mouth daily with breakfast.  Dispense: 90 tablet; Refill: 0  2. Vitamin D deficiency Low Vitamin D level contributes to fatigue and are associated  with obesity, breast, and colon cancer. She agrees to continue to take Vitamin D @4 ,000 IU daily and will follow-up for routine testing of Vitamin D, at least 2-3 times per year to avoid over-replacement.  3. At risk for diabetes mellitus Hailey Noble was given approximately 15 minutes of diabetes education and counseling today. We discussed intensive lifestyle modifications today with an emphasis on weight loss as well as increasing exercise and decreasing simple carbohydrates in her diet. We also reviewed medication options with an emphasis on risk versus benefit of those discussed.   Repetitive spaced learning was employed today to elicit superior memory formation and behavioral change.  4. Class 2 severe obesity with serious comorbidity and body mass index (BMI) of 35.0 to 35.9 in adult, unspecified obesity type (HCC) Hailey Noble is currently in the action stage of change. As such, her goal is to continue with weight loss efforts. She has agreed to the Category 3 Plan.   Exercise goals: As is.  Behavioral modification strategies: increasing lean protein intake and meal planning and cooking strategies.  Hailey Noble has agreed to follow-up with our clinic in 3 weeks. She was informed of the importance of frequent follow-up visits to maximize her success with intensive lifestyle modifications for her multiple health conditions.   Objective:   Blood pressure 109/72, pulse 82, temperature 98 F (36.7 C), temperature source Oral, height 5\' 5"  (1.651 m), weight 211 lb (95.7 kg), last menstrual period 11/22/2018, SpO2 96 %. Body mass index is 35.11 kg/m.  General:  Cooperative, alert, well developed, in no acute distress. HEENT: Conjunctivae and lids unremarkable. Cardiovascular: Regular rhythm.  Lungs: Normal work of breathing. Neurologic: No focal deficits.   Lab Results  Component Value Date   CREATININE 0.50 12/02/2019   BUN 20 12/02/2019   NA 138 12/02/2019   K 4.3 12/02/2019   CL 106 12/02/2019     CO2 25 12/02/2019   Lab Results  Component Value Date   ALT 18 12/02/2019   AST 16 12/02/2019   ALKPHOS 141 (H) 12/02/2019   BILITOT 0.4 12/02/2019   Lab Results  Component Value Date   HGBA1C 5.4 12/02/2019   HGBA1C 5.3 10/08/2019   HGBA1C 5.4 06/16/2019   HGBA1C 5.4 12/30/2018   HGBA1C 5.4 09/21/2018   Lab Results  Component Value Date   INSULIN 9.3 12/30/2018   INSULIN 11.1 09/21/2018   INSULIN 8.7 04/30/2018   INSULIN 8.8 10/06/2017   Lab Results  Component Value Date   TSH 1.37 12/02/2019   Lab Results  Component Value Date   CHOL 161 12/02/2019   HDL 44.60 12/02/2019   LDLCALC 92 12/02/2019   TRIG 122.0 12/02/2019   CHOLHDL 4 12/02/2019   Lab Results  Component Value Date   WBC 7.1 12/02/2019   HGB 13.6 12/02/2019   HCT 40.9 12/02/2019   MCV 89.8 12/02/2019   PLT 333.0 12/02/2019   Lab Results  Component Value Date   FERRITIN 50 11/09/2008   Attestation Statements:   Reviewed by clinician on day of visit: allergies, medications, problem list, medical history, surgical history, family history, social history, and previous encounter notes.  I, Water quality scientist, CMA, am acting as Location manager for [employer], PA-C.  I have reviewed the above documentation for accuracy and completeness, and I agree with the above. Abby Potash, PA-C

## 2020-02-24 ENCOUNTER — Ambulatory Visit (INDEPENDENT_AMBULATORY_CARE_PROVIDER_SITE_OTHER): Payer: 59 | Admitting: Physician Assistant

## 2020-02-24 MED FILL — ESCITALOPRAM 10 MG TABLET: 10 | 90 days supply | Qty: 90 | Fill #0

## 2020-03-15 ENCOUNTER — Encounter: Payer: Self-pay | Admitting: Family Medicine

## 2020-03-15 ENCOUNTER — Other Ambulatory Visit: Payer: Self-pay

## 2020-03-15 ENCOUNTER — Ambulatory Visit: Payer: 59 | Admitting: Family Medicine

## 2020-03-15 VITALS — BP 124/71 | Ht 65.0 in | Wt 210.0 lb

## 2020-03-15 DIAGNOSIS — M7672 Peroneal tendinitis, left leg: Secondary | ICD-10-CM

## 2020-03-15 MED ORDER — PREDNISONE 5 MG PO TABS: ORAL_TABLET | ORAL | 0 refills | Status: DC

## 2020-03-15 MED FILL — predniSONE 5 MG TABS: 5 | 6 days supply | Qty: 21 | Fill #0

## 2020-03-15 NOTE — Assessment & Plan Note (Signed)
Having some improvement but pain is still ongoing. -Cam walker.  Counseled to use this intermittently with work. -Prednisone. -Referral to physical therapy. -Counseled on home exercise therapy and supportive care -Follow-up in 6 weeks.

## 2020-03-15 NOTE — Patient Instructions (Signed)
Good to see you Please continue the compression  Please try ice if needed Physical therapy will give you a call  You can try using the boot intermittently.   Please send me a message in MyChart with any questions or updates.  Please see me back in 6 weeks.   --Dr. Raeford Razor

## 2020-03-15 NOTE — Progress Notes (Signed)
Hailey Noble - 54 y.o. female MRN [id number]  Date of birth: 17-Aug-1966  SUBJECTIVE:  Including CC & ROS.  Chief Complaint  Patient presents with  . Follow-up    follow up for left ankle    Hailey Noble is a 54 y.o. female that is following up for her left ankle pain.  She was previously seen and diagnosed with peroneal tendinitis.  She has tried compression which improves her symptoms.  The pain seems to be worse if she is up and down during the course of the day.  Still occurring over the lateral aspect of the ankle.   Review of Systems See HPI   HISTORY: Past Medical, Surgical, Social, and Family History Reviewed & Updated per EMR.   Pertinent Historical Findings include:  Past Medical History:  Diagnosis Date  . Allergic state 12/08/2016  . Allergy   . Anxiety   . Back pain    " i just have a weak loer back"   . Fibroids   . GERD (gastroesophageal reflux disease)   . Hyperlipidemia 06/09/2017  . Insulin resistance   . Joint pain   . Knee pain, right 03/10/2017  . Plantar fasciitis   . PONV (postoperative nausea and vomiting)    just nausea   . Shortness of breath    not only if  i exert myselg   . Sinusitis   . Vitamin D deficiency 11/22/2016  . Vitamin D deficiency     Past Surgical History:  Procedure Laterality Date  . ABDOMINAL HYST[place]CTOMY  09/2019   [place]  . COLONOSCOPY  02-2009   with Henrene Pastor normal  . CYSTOSCOPY N/A 10/12/2019   Procedure: CYSTOSCOPY;  Surgeon: Sherlyn Hay, DO;  Location: [facility];  Service: Gynecology;  Laterality: N/A;  . ESOPHAGOGASTRODUODENOSCOPY  2012  . GANGLION CYST EXCISION Right    right- wrist   . NASAL SEPTUM SURGERY    . NASAL SINUS SURGERY    . TMJ ARTHROSCOPY     left  . TOTAL LAPAROSCOPIC HYSTERECTOMY WITH BILATERAL SALPINGO OOPHORECTOMY Bilateral 10/12/2019   Procedure: TOTAL LAPAROSCOPIC HYSTERECTOMY WITH BILATERAL SALPINGO OOPHORECTOMY, Lysis Pelvic Adhseions ;  Surgeon: Sherlyn Hay, DO;  Location: [facility];  Service: Gynecology;  Laterality: Bilateral;    Family History  Problem Relation Age of Onset  . Hypertension Father   . Diabetes Father   . Hyperlipidemia Father   . Breast cancer Mother 48  . Cancer Mother        breast  . Thyroid disease Mother   . Sleep apnea Mother   . Colon cancer Paternal Grandfather        died at age 99  . Cancer Paternal Grandfather        GI CA  . Obesity Sister   . Diabetes Sister   . Hypertension Sister   . Heart disease Maternal Grandmother        MI  . Cancer Maternal Grandfather        lung, smoker  . Diabetes Maternal Grandfather   . Diabetes Paternal Grandmother   . Heart disease Paternal Grandmother   . Pancreatitis Sister   . Esophageal cancer Neg Hx   . Stomach cancer Neg Hx   . Stroke Neg Hx   . Colon polyps Neg Hx   . Rectal cancer Neg Hx   . Pancreatic cancer Neg Hx     Social History   Socioeconomic History  . Marital  status: Single    Spouse name: Not on file  . Number of children: Not on file  . Years of education: Not on file  . Highest education level: Not on file  Occupational History  . Occupation: Programmer, multimedia: [employer]  Tobacco Use  . Smoking status: Never Smoker  . Smokeless tobacco: Never Used  Substance and Sexual Activity  . Alcohol use: Yes    Alcohol/week: 0.0 standard drinks    Comment: rare  . Drug use: No  . Sexual activity: Yes    Birth control/protection: I.U.D.  Other Topics Concern  . 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 Works at [employer]. No dietary restrictions. Lives with dog   Social Determinants of Health   Financial Resource Strain:   . Difficulty of Paying Living Expenses:   Food Insecurity:   . Worried About Charity fundraiser in the Last Year:   . Arboriculturist in the Last Year:   Transportation Needs:   . Film/video editor  (Medical):   Marland Kitchen Lack of Transportation (Non-Medical):   Physical Activity:   . Days of Exercise per Week:   . Minutes of Exercise per Session:   Stress:   . Feeling of Stress :   Social Connections:   . Frequency of Communication with Friends and Family:   . Frequency of Social Gatherings with Friends and Family:   . Attends Religious Services:   . Active Member of Clubs or Organizations:   . Attends Archivist Meetings:   Marland Kitchen Marital Status:   Intimate Partner Violence:   . Fear of Current or Ex-Partner:   . Emotionally Abused:   Marland Kitchen Physically Abused:   . Sexually Abused:      PHYSICAL EXAM:  VS: BP 124/71   Ht 5\' 5"  (1.651 m)   Wt 210 lb (95.3 kg)   LMP 11/22/2018 (Approximate)   BMI 34.95 kg/m  Physical Exam Gen: NAD, alert, cooperative with exam, well-appearing MSK:  Left ankle: Tenderness to palpation of the peroneal tendons. Normal strength resistance. Some pain elicited with resistance and eversion. Normal ankle range of motion. Neurovascularly intact     ASSESSMENT & PLAN:   Peroneal tendinitis of left lower leg Having some improvement but pain is still ongoing. -Cam walker.  Counseled to use this intermittently with work. -Prednisone. -Referral to physical therapy. -Counseled on home exercise therapy and supportive care -Follow-up in 6 weeks.

## 2020-03-20 ENCOUNTER — Other Ambulatory Visit: Payer: Self-pay

## 2020-03-20 ENCOUNTER — Ambulatory Visit (INDEPENDENT_AMBULATORY_CARE_PROVIDER_SITE_OTHER): Payer: 59 | Admitting: Physician Assistant

## 2020-03-20 ENCOUNTER — Encounter (INDEPENDENT_AMBULATORY_CARE_PROVIDER_SITE_OTHER): Payer: Self-pay | Admitting: Physician Assistant

## 2020-03-20 VITALS — BP 146/75 | HR 83 | Temp 97.7°F | Ht 65.0 in | Wt 209.0 lb

## 2020-03-20 DIAGNOSIS — E559 Vitamin D deficiency, unspecified: Secondary | ICD-10-CM

## 2020-03-20 DIAGNOSIS — Z6834 Body mass index (BMI) 34.0-34.9, adult: Secondary | ICD-10-CM | POA: Diagnosis not present

## 2020-03-20 DIAGNOSIS — E669 Obesity, unspecified: Secondary | ICD-10-CM

## 2020-03-20 NOTE — Progress Notes (Signed)
Chief Complaint:   Hailey Noble is here to discuss her progress with her obesity treatment plan along with follow-up of her obesity related diagnoses. Hailey Noble is on the Category 3 Plan and states she is following her eating plan approximately 90% of the time. Hailey Noble states she is walking/swimming 30 minutes 5 times per week.  Today's visit was #: 61 Starting weight: 212 lbs Starting date: 10/06/2017 Today's weight: 209 lbs Today's date: 03/20/2020 Total lbs lost to date: 3 Total lbs lost since last in-office visit: 2  Interim History: Hailey Noble states that her father is very sick right now and she has not been swimming as much. She has been eating a lot of chicken and yogurt.  Subjective:   Vitamin D deficiency. Anuhea is on OTC Vitamin D 4,000 units daily. No nausea, vomiting, or muscle weakness. Last Vitamin D 37.98 on 12/02/2019.  Assessment/Plan:   Vitamin D deficiency. Low Vitamin D level contributes to fatigue and are associated with obesity, breast, and colon cancer. She agrees to continue to take OTC Vitamin D and will follow-up for routine testing of Vitamin D, at least 2-3 times per year to avoid over-replacement.  Class 1 obesity with serious comorbidity and body mass index (BMI) of 34.0 to 34.9 in adult, unspecified obesity type.  Hailey Noble is currently in the action stage of change. As such, her goal is to continue with weight loss efforts. She has agreed to the Category 3 Plan.   Exercise goals: For substantial health benefits, adults should do at least 150 minutes (2 hours and 30 minutes) a week of moderate-intensity, or 75 minutes (1 hour and 15 minutes) a week of vigorous-intensity aerobic physical activity, or an equivalent combination of moderate- and vigorous-intensity aerobic activity. Aerobic activity should be performed in episodes of at least 10 minutes, and preferably, it should be spread throughout the week.  Behavioral modification strategies: meal  planning and cooking strategies and planning for success.  Hailey Noble has agreed to follow-up with our clinic in 2 weeks. She was informed of the importance of frequent follow-up visits to maximize her success with intensive lifestyle modifications for her multiple health conditions.   Objective:   Blood pressure (!) 146/75, pulse 83, temperature 97.7 F (36.5 C), temperature source Oral, height 5\' 5"  (1.651 m), weight 209 lb (94.8 kg), last menstrual period 11/22/2018, SpO2 98 %. Body mass index is 34.78 kg/m.  General: Cooperative, alert, well developed, in no acute distress. HEENT: Conjunctivae and lids unremarkable. Cardiovascular: Regular rhythm.  Lungs: Normal work of breathing. Neurologic: No focal deficits.   Lab Results  Component Value Date   CREATININE 0.50 12/02/2019   BUN 20 12/02/2019   NA 138 12/02/2019   K 4.3 12/02/2019   CL 106 12/02/2019   CO2 25 12/02/2019   Lab Results  Component Value Date   ALT 18 12/02/2019   AST 16 12/02/2019   ALKPHOS 141 (H) 12/02/2019   BILITOT 0.4 12/02/2019   Lab Results  Component Value Date   HGBA1C 5.4 12/02/2019   HGBA1C 5.3 10/08/2019   HGBA1C 5.4 06/16/2019   HGBA1C 5.4 12/30/2018   HGBA1C 5.4 09/21/2018   Lab Results  Component Value Date   INSULIN 9.3 12/30/2018   INSULIN 11.1 09/21/2018   INSULIN 8.7 04/30/2018   INSULIN 8.8 10/06/2017   Lab Results  Component Value Date   TSH 1.37 12/02/2019   Lab Results  Component Value Date   CHOL 161 12/02/2019   HDL  44.60 12/02/2019   LDLCALC 92 12/02/2019   TRIG 122.0 12/02/2019   CHOLHDL 4 12/02/2019   Lab Results  Component Value Date   WBC 7.1 12/02/2019   HGB 13.6 12/02/2019   HCT 40.9 12/02/2019   MCV 89.8 12/02/2019   PLT 333.0 12/02/2019   Lab Results  Component Value Date   FERRITIN 50 11/09/2008   Attestation Statements:   Reviewed by clinician on day of visit: allergies, medications, problem list, medical history, surgical history, family  history, social history, and previous encounter notes.  Time spent on visit including pre-visit chart review and post-visit charting and care was 22 minutes.   IMichaelene Song, am acting as transcriptionist for Abby Potash, PA-C   I have reviewed the above documentation for accuracy and completeness, and I agree with the above. Abby Potash, PA-C

## 2020-03-21 ENCOUNTER — Ambulatory Visit: Payer: 59 | Attending: Family Medicine | Admitting: Physical Therapy

## 2020-03-21 ENCOUNTER — Encounter: Payer: Self-pay | Admitting: Physical Therapy

## 2020-03-21 DIAGNOSIS — R262 Difficulty in walking, not elsewhere classified: Secondary | ICD-10-CM | POA: Insufficient documentation

## 2020-03-21 DIAGNOSIS — M25672 Stiffness of left ankle, not elsewhere classified: Secondary | ICD-10-CM | POA: Insufficient documentation

## 2020-03-21 DIAGNOSIS — M25572 Pain in left ankle and joints of left foot: Secondary | ICD-10-CM | POA: Insufficient documentation

## 2020-03-21 NOTE — Therapy (Signed)
[facility] [address]  [place], [place], [address] Phone: [phone]   Fax:  [phone]  Physical Therapy Evaluation  Patient Details  Name: Hailey Noble MRN: [id number] Date of Birth: [birth year] Referring Provider (PT): Clearance Coots, MD   Encounter Date: 03/21/2020  PT End of Session - 03/21/20 1755    Visit Number  1    Number of Visits  7    Date for PT Re-Evaluation  05/02/20    Authorization Type  [facility]    PT Start Time  1701    PT Stop Time  1742    PT Time Calculation (min)  41 min    Activity Tolerance  Patient tolerated treatment well    Behavior During Therapy  [facility] for tasks assessed/performed       Past Medical History:  Diagnosis Date  . Allergic state 12/08/2016  . Allergy   . Anxiety   . Back pain    " i just have a weak loer back"   . Fibroids   . GERD (gastroesophageal reflux disease)   . Hyperlipidemia 06/09/2017  . Insulin resistance   . Joint pain   . Knee pain, right 03/10/2017  . Plantar fasciitis   . PONV (postoperative nausea and vomiting)    just nausea   . Shortness of breath    not only if  i exert myselg   . Sinusitis   . Vitamin D deficiency 11/22/2016  . Vitamin D deficiency     Past Surgical History:  Procedure Laterality Date  . ABDOMINAL HYSTERECTOMY  09/2019   [place]  . COLONOSCOPY  02-2009   with Henrene Pastor normal  . CYSTOSCOPY N/A 10/12/2019   Procedure: CYSTOSCOPY;  Surgeon: Sherlyn Hay, DO;  Location: [facility];  Service: Gynecology;  Laterality: N/A;  . ESOPHAGOGASTRODUODENOSCOPY  2012  . GANGLION CYST EXCISION Right    right- wrist   . NASAL SEPTUM SURGERY    . NASAL SINUS SURGERY    . TMJ ARTHROSCOPY     left  . TOTAL LAPAROSCOPIC HYSTERECTOMY WITH BILATERAL SALPINGO OOPHORECTOMY Bilateral 10/12/2019   Procedure: TOTAL LAPAROSCOPIC HYSTERECTOMY WITH BILATERAL SALPINGO OOPHORECTOMY, Lysis Pelvic Adhseions ;  Surgeon: Sherlyn Hay, DO;  Location: [facility];  Service: Gynecology;  Laterality: Bilateral;    There were no vitals filed for this visit.   Subjective Assessment - 03/21/20 1705    Subjective  Patient reports L foot/ankle pain since February 2020 when she was wearing uncomfortable shoes at a wedding. Pain has increased since then, and more recently the pain and edema in has gotten worse and become more severe. Pain is located over L lateral Achilles and lateral ankle below the lateral malleolus. Is wearing ted hose and a compression ankle brace for edema and support on B feet and this is helping. Pain is worse with walking- especially on an incline and feels tightness with ankle inversion. Also feels unstable with waking on uneven surfaces and has had several falls in her yard. Has been working on HEP given to her by MD. Denies N/T.    Pertinent History  SOB, plantar fasciitis, R knee pain, HLD, GERD, back pain, anxiety, L TMJ arthroscopy    Limitations  Lifting;Standing;Walking;House hold activities    How long can you sit comfortably?  unlimited    How long can you stand comfortably?  unlimited    How long can you walk comfortably?  if walking on incline, 30 min    Diagnostic tests  03/01/20 L ankle [name]: Findings would suggest peroneal tendinopathy    Patient Stated Goals  "work on strength"    Currently in Pain?  Yes    Pain Location  Foot    Pain Orientation  Left;Lateral    Pain Descriptors / Indicators  Other (Comment)   "pulling"   Pain Type  Chronic pain         [facility] PT Assessment - 03/21/20 1712      Assessment   Medical Diagnosis  Peroneal Tendinitis of L lower leg    Referring Provider (PT)  Clearance Coots, MD    Onset Date/Surgical Date  01/23/19    Next MD Visit  04/26/20    Prior Therapy  yes      Precautions   Precautions  None      Balance Screen   Has the patient fallen in the past 6 months  Yes    How many times?  3   fell in yard- denies  injuries    Has the patient had a decrease in activity level because of a fear of falling?   No    Is the patient reluctant to leave their home because of a fear of falling?   No      Home Environment   Living Environment  Private residence    Living Arrangements  Alone    Available Help at Discharge  Family    Type of [place] to enter    Entrance Stairs-Number of Steps  1    Entrance Stairs-Rails  Right    Home Layout  Two level;Able to live on main level with bedroom/bathroom    Alternate Level Stairs-Number of Steps  13    Alternate Level Stairs-Rails  Right    Home Equipment  None      Prior Function   Level of Independence  Independent    Vocation  Full time employment    Presenter, broadcasting in oncology- standing    Leisure  working out      [employer]   Overall Cognitive Status  Within Functional Limits for tasks assessed      Sensation   Light Touch  Appears Intact      Coordination   Gross Motor Movements are Fluid and Coordinated  Yes      Posture/Postural Control   Posture/Postural Control  Postural limitations    Postural Limitations  Weight shift right;Rounded Shoulders      ROM / Strength   AROM / PROM / Strength  AROM;Strength      AROM   AROM Assessment Site  Ankle    Right/Left Ankle  Left;Right    Right Ankle Dorsiflexion  2    Right Ankle Plantar Flexion  50    Right Ankle Inversion  31    Right Ankle Eversion  23    Left Ankle Dorsiflexion  5    Left Ankle Plantar Flexion  51    Left Ankle Inversion  41    Left Ankle Eversion  22      Strength   Strength Assessment Site  Hip;Knee;Ankle    Right/Left Hip  Right;Left    Right Hip Flexion  5/5    Right Hip ABduction  4+/5    Right Hip ADduction  4+/5    Left Hip Flexion  5/5    Left Hip  ABduction  4+/5    Left Hip ADduction  4+/5    Right/Left Knee  Right;Left    Right Knee Flexion  5/5    Right Knee Extension  5/5    Left Knee Flexion  4/5    Left  Knee Extension  4+/5    Right/Left Ankle  Right;Left    Right Ankle Dorsiflexion  4+/5    Right Ankle Plantar Flexion  5/5    Right Ankle Inversion  4+/5    Right Ankle Eversion  4+/5    Left Ankle Dorsiflexion  4+/5    Left Ankle Plantar Flexion  5/5    Left Ankle Inversion  4/5    Left Ankle Eversion  4+/5   pain in L lateral foot     Flexibility   Soft Tissue Assessment /Muscle Length  yes   decreased B gastroc flexibility     Palpation   Palpation comment  palpable edema over L lateral achilles and lateral foot; TTP over L lateral achilles;  increased soft tissue restriction in B peroneal tendons but no TTP; no TTP in B calf      Ambulation/Gait   Assistive device  None    Gait Pattern  Step-through pattern   B increased toe out   Ambulation Surface  Level;Indoor    Gait velocity  WNL                Objective measurements completed on examination: See above findings.              PT Education - 03/21/20 1754    Education Details  prognosis, POC, HEP    Person(s) Educated  Patient    Methods  Explanation;Demonstration;Tactile cues;Verbal cues;Handout    Comprehension  Verbalized understanding;Returned demonstration       PT Short Term Goals - 03/21/20 1802      PT SHORT TERM GOAL #1   Title  Patient to be independent with initial HEP.    Time  3    Period  Weeks    Status  New    Target Date  04/11/20        PT Long Term Goals - 03/21/20 1802      PT LONG TERM GOAL #1   Title  Patient to be independent with advanced HEP.    Time  6    Period  Weeks    Status  New    Target Date  05/02/20      PT LONG TERM GOAL #2   Title  Patient to demonstrate pain-free L ankle strength testing with >/=4+/5 strength.    Time  6    Period  Weeks    Status  New    Target Date  05/02/20      PT LONG TERM GOAL #3   Title  Patient to demonstrate L ankle AROM WNL and pain-free.    Time  6    Period  Weeks    Status  New    Target Date  05/02/20       PT LONG TERM GOAL #4   Title  Patient to demonstrate L SLS on foam for 15 sec with LOB.    Time  6    Period  Weeks    Status  New    Target Date  05/02/20      PT LONG TERM GOAL #5   Title  Patient to report 70% improvement in pain/discomfort when walking on graded treadmill for  1 hour.    Time  6    Period  Weeks    Status  New    Target Date  05/02/20             Plan - 03/21/20 1755    Clinical Impression Statement  Patient is a 54y/o F presenting to OPPT with c/o worsening L foot pain since February 2020. Initially noticed this pain when wearing uncomfortable shoes at a wedding. More recently, pain is brought on when walking on inclines. Patient also reports ankle instability when walking outside and reports several falls in her yard. Pain is located over L lateral Achilles and lateral ankle below the lateral malleolus. Denies N/T. Patient today presenting with limited B ankle dorsiflexion and plantarflexion AROM, mildly painful L ankle eversion with strength testing, decreased B gastroc flexibility, palpable edema over L lateral Achilles and lateral foot, TTP over L lateral Achilles, and increased soft tissue restriction in B peroneal tendons. Patient educated on gentle stretching and strengthening HEP- patient reported understanding. Would benefit from skilled PT services 1x/week for 6 weeks to address aforementioned impairments.    Personal Factors and Comorbidities  Age;Comorbidity 3+;Fitness;Past/Current Experience;Profession;Time since onset of injury/illness/exacerbation    Comorbidities  SOB, plantar fasciitis, R knee pain, HLD, GERD, back pain, anxiety, L TMJ arthroscopy    Examination-Activity Limitations  Locomotion Level    Examination-Participation Restrictions  Church;Shop;Yard Work    Stability/Clinical Decision Making  Stable/Uncomplicated    Clinical Decision Making  Low    Rehab Potential  Good    PT Frequency  1x / week    PT Duration  6 weeks    PT  Treatment/Interventions  ADLs/Self Care Home Management;Cryotherapy;Electrical Stimulation;Iontophoresis 4mg /ml Dexamethasone;Moist Heat;Balance training;Therapeutic exercise;Therapeutic activities;Functional mobility training;Stair training;Gait training;Ultrasound;Neuromuscular re-education;Patient/family education;Wheelchair mobility training;Manual techniques;Vestibular;Vasopneumatic Device;Taping;Energy conservation;Dry needling;Passive range of motion    PT Next Visit Plan  ankle FOTO    Consulted and Agree with Plan of Care  Patient       Patient will benefit from skilled therapeutic intervention in order to improve the following deficits and impairments:  Decreased activity tolerance, Pain, Increased fascial restricitons, Decreased balance, Difficulty walking, Decreased range of motion, Improper body mechanics, Postural dysfunction, Impaired flexibility  Visit Diagnosis: Pain in left ankle and joints of left foot  Stiffness of left ankle, not elsewhere classified  Difficulty in walking, not elsewhere classified     Problem List Patient Active Problem List   Diagnosis Date Noted  . Peroneal tendinitis of left lower leg 02/16/2020  . Leukocytosis 12/02/2019  . Hyperglycemia 12/02/2019  . Pain of left heel 12/02/2019  . Pelvic pain 10/12/2019  . Post-operative state 10/12/2019  . Pain, abdominal, RLQ 11/28/2017  . Insulin resistance 10/21/2017  . Hyperlipidemia 06/09/2017  . Knee pain, right 03/10/2017  . Allergic state 12/08/2016  . Vitamin D deficiency 11/22/2016  . Depression with anxiety 04/06/2014  . Obesity (BMI 30-39.9) 04/06/2014  . Preventative health care 06/17/2011  . INGUINAL PAIN, LEFT 12/05/2008     Janene Harvey, PT, DPT 03/21/20 6:06 PM   [facility] [address]  [place], [place], [address] Phone: [phone]   Fax:  [phone]  Name: Hailey Noble MRN: [id number] Date of  Birth: [birth year]

## 2020-03-27 ENCOUNTER — Ambulatory Visit: Payer: 59 | Attending: Family Medicine | Admitting: Physical Therapy

## 2020-03-27 ENCOUNTER — Encounter: Payer: Self-pay | Admitting: Physical Therapy

## 2020-03-27 ENCOUNTER — Other Ambulatory Visit: Payer: Self-pay

## 2020-03-27 DIAGNOSIS — R262 Difficulty in walking, not elsewhere classified: Secondary | ICD-10-CM

## 2020-03-27 DIAGNOSIS — M25572 Pain in left ankle and joints of left foot: Secondary | ICD-10-CM | POA: Diagnosis not present

## 2020-03-27 DIAGNOSIS — M25672 Stiffness of left ankle, not elsewhere classified: Secondary | ICD-10-CM

## 2020-03-27 NOTE — Therapy (Signed)
[facility] [address]  [place], [place], [address] Phone: [phone]   Fax:  [phone]  Physical Therapy Treatment  Patient Details  Name: Hailey Noble MRN: [id number] Date of Birth: 12-31-1965 Referring Provider (PT): Clearance Coots, MD   Encounter Date: 03/27/2020  PT End of Session - 03/27/20 [id number]    Visit Number  2    Number of Visits  7    Date for PT Re-Evaluation  05/02/20    Authorization Type  [facility]    PT Start Time  1701    PT Stop Time  1744    PT Time Calculation (min)  43 min    Activity Tolerance  Patient tolerated treatment well    Behavior During Therapy  [facility] for tasks assessed/performed       Past Medical History:  Diagnosis Date  . Allergic state 12/08/2016  . Allergy   . Anxiety   . Back pain    " i just have a weak loer back"   . Fibroids   . GERD (gastroesophageal reflux disease)   . Hyperlipidemia 06/09/2017  . Insulin resistance   . Joint pain   . Knee pain, right 03/10/2017  . Plantar fasciitis   . PONV (postoperative nausea and vomiting)    just nausea   . Shortness of breath    not only if  i exert myselg   . Sinusitis   . Vitamin D deficiency 11/22/2016  . Vitamin D deficiency     Past Surgical History:  Procedure Laterality Date  . ABDOMINAL HYSTERECTOMY  09/2019   [place]  . COLONOSCOPY  02-2009   with Henrene Pastor normal  . CYSTOSCOPY N/A 10/12/2019   Procedure: CYSTOSCOPY;  Surgeon: Sherlyn Hay, DO;  Location: [facility];  Service: Gynecology;  Laterality: N/A;  . ESOPHAGOGASTRODUODENOSCOPY  2012  . GANGLION CYST EXCISION Right    right- wrist   . NASAL SEPTUM SURGERY    . NASAL SINUS SURGERY    . TMJ ARTHROSCOPY     left  . TOTAL LAPAROSCOPIC HYSTERECTOMY WITH BILATERAL SALPINGO OOPHORECTOMY Bilateral 10/12/2019   Procedure: TOTAL LAPAROSCOPIC HYSTERECTOMY WITH BILATERAL SALPINGO OOPHORECTOMY, Lysis Pelvic Adhseions ;  Surgeon: Sherlyn Hay, DO;  Location: [facility];  Service: Gynecology;  Laterality: Bilateral;    There were no vitals filed for this visit.  Subjective Assessment - 03/27/20 1702    Subjective  Doing well today. Denies questions on HEP.    Pertinent History  SOB, plantar fasciitis, R knee pain, HLD, GERD, back pain, anxiety, L TMJ arthroscopy    Diagnostic tests  03/01/20 L ankle [name]: Findings would suggest peroneal tendinopathy    Patient Stated Goals  "work on strength"    Currently in Pain?  No/denies         [facility] PT Assessment - 03/27/20 0001      Observation/Other Assessments   Focus on Therapeutic Outcomes (FOTO)   Lower leg: 82 (18% limited, 15% predicted)                   [facility] Adult PT Treatment/Exercise - 03/27/20 0001      Exercises   Exercises  Ankle      Ankle Exercises: Stretches   Soleus Stretch  1 rep;30 seconds    Soleus Stretch Limitations  at wall    Gastroc Stretch  1 rep;30 seconds    Gastroc Stretch Limitations  toes  on wall; each LE      Ankle Exercises: Aerobic   Nustep  L5 x 6 min (LEs only)      Ankle Exercises: Standing   Vector Stance  Left;5 reps    Vector Stance Limitations  L SLS to 5 cones    SLS  R/L 30"; on foam on L LE 2x30"    minimal sway on firm, LOB and severe sway on foam     Ankle Exercises: Seated   Other Seated Ankle Exercises  L ankle inversion AROM x20, with yellow TB x10   heavy cues to avoid hip ER compensation     Ankle Exercises: Sidelying   Ankle Inversion  Strengthening;Left;10 reps    Ankle Inversion Limitations  cues for set up and proper motion    Ankle Eversion  Strengthening;Left;10 reps    Ankle Eversion Limitations  cues for set up and proper motion             PT Education - 03/27/20 1742    Education Details  update to HEP    Person(s) Educated  Patient    Methods  Explanation;Demonstration;Tactile cues;Verbal cues;Handout    Comprehension  Verbalized  understanding;Returned demonstration       PT Short Term Goals - 03/27/20 1745      PT SHORT TERM GOAL #1   Title  Patient to be independent with initial HEP.    Time  3    Period  Weeks    Status  On-going    Target Date  04/11/20        PT Long Term Goals - 03/27/20 1746      PT LONG TERM GOAL #1   Title  Patient to be independent with advanced HEP.    Time  6    Period  Weeks    Status  On-going      PT LONG TERM GOAL #2   Title  Patient to demonstrate pain-free L ankle strength testing with >/=4+/5 strength.    Time  6    Period  Weeks    Status  On-going      PT LONG TERM GOAL #3   Title  Patient to demonstrate L ankle AROM WNL and pain-free.    Time  6    Period  Weeks    Status  On-going      PT LONG TERM GOAL #4   Title  Patient to demonstrate L SLS on foam for 30 sec with mild-moderate sway.    Time  6    Period  Weeks    Status  On-going      PT LONG TERM GOAL #5   Title  Patient to report 70% improvement in pain/discomfort when walking on graded treadmill for 1 hour.    Time  6    Period  Weeks    Status  On-going            Plan - 03/27/20 1747    Clinical Impression Statement  Patient without new complaints today. Denies questions on HEP. Assessed L ankle stability with patient demonstrating good balance on firm surface; more instability and LOB on foam. Adjusted goals to further improve patient's stability on compliant surface. Patient required heavy cues to avoid hip ER compensation with resisted ankle eversion. Patient with most success when physically stabilizing her LE at the knee to avoid compensation. Able to perform sidelying ankle inversion and eversion with improved form. Patient reported good relief with ankle inversion.  Updated HEP with exercises that were well-tolerated today. Patient reported understanding. No complaints or pain at end of session.    Comorbidities  SOB, plantar fasciitis, R knee pain, HLD, GERD, back pain, anxiety, L  TMJ arthroscopy    PT Treatment/Interventions  ADLs/Self Care Home Management;Cryotherapy;Electrical Stimulation;Iontophoresis 4mg /ml Dexamethasone;Moist Heat;Balance training;Therapeutic exercise;Therapeutic activities;Functional mobility training;Stair training;Gait training;Ultrasound;Neuromuscular re-education;Patient/family education;Wheelchair mobility training;Manual techniques;Vestibular;Vasopneumatic Device;Taping;Energy conservation;Dry needling;Passive range of motion    PT Next Visit Plan  progress ankle stability and strengthening    Consulted and Agree with Plan of Care  Patient       Patient will benefit from skilled therapeutic intervention in order to improve the following deficits and impairments:  Decreased activity tolerance, Pain, Increased fascial restricitons, Decreased balance, Difficulty walking, Decreased range of motion, Improper body mechanics, Postural dysfunction, Impaired flexibility  Visit Diagnosis: Pain in left ankle and joints of left foot  Stiffness of left ankle, not elsewhere classified  Difficulty in walking, not elsewhere classified     Problem List Patient Active Problem List   Diagnosis Date Noted  . Peroneal tendinitis of left lower leg 02/16/2020  . Leukocytosis 12/02/2019  . Hyperglycemia 12/02/2019  . Pain of left heel 12/02/2019  . Pelvic pain 10/12/2019  . Post-operative state 10/12/2019  . Pain, abdominal, RLQ 11/28/2017  . Insulin resistance 10/21/2017  . Hyperlipidemia 06/09/2017  . Knee pain, right 03/10/2017  . Allergic state 12/08/2016  . Vitamin D deficiency 11/22/2016  . Depression with anxiety 04/06/2014  . Obesity (BMI 30-39.9) 04/06/2014  . Preventative health care 06/17/2011  . INGUINAL PAIN, LEFT 12/05/2008     Janene Harvey, PT, DPT 03/27/20 5:53 PM   [facility] [address]  [place], [place], [address] Phone: [phone]   Fax:   [phone]  Name: Hailey Noble MRN: [id number] Date of Birth: [birth year]

## 2020-04-03 ENCOUNTER — Other Ambulatory Visit: Payer: Self-pay

## 2020-04-03 ENCOUNTER — Ambulatory Visit: Payer: 59 | Admitting: Physical Therapy

## 2020-04-03 ENCOUNTER — Encounter: Payer: Self-pay | Admitting: Physical Therapy

## 2020-04-03 DIAGNOSIS — R262 Difficulty in walking, not elsewhere classified: Secondary | ICD-10-CM | POA: Diagnosis not present

## 2020-04-03 DIAGNOSIS — M25672 Stiffness of left ankle, not elsewhere classified: Secondary | ICD-10-CM

## 2020-04-03 DIAGNOSIS — M25572 Pain in left ankle and joints of left foot: Secondary | ICD-10-CM | POA: Diagnosis not present

## 2020-04-03 NOTE — Patient Instructions (Signed)
   Kinesiology tape  What is kinesiology tape?  There are many brands of kinesiology tape. KTape, Rock Tape, Body Sport, Dynamic tape, to name a few.  It is an elasticized tape designed to support the body's natural healing process. This tape provides stability and support to muscles and joints without restricting motion.  It can also help decrease swelling in the area of application.  How does it work?  The tape microscopically lifts and decompresses the skin to allow for drainage of lymph (swelling) to flow away from area, reducing inflammation. The tape has the ability to help re-educate the neuromuscular system by targeting specific receptors in the skin. The presence of the tape increases the body's awareness of posture and body mechanics.  Do not use with:  . Open wounds . Skin lesions . Adhesive allergies  In some rare cases, mild/moderate skin irritation can occur. This can include redness, itchiness, or hives. If this occurs, immediately remove tape and consult your primary care physician if symptoms are severe or do not resolve within 2 days.  Safe removal of the tape:  To remove tape safely, hold nearby skin with one hand and gentle roll tape down with other hand. You can apply oil or conditioner to tape while in shower prior to removal to loosen adhesive. DO NOT swiftly rip tape off like a band-aid, as this could cause skin tears and additional skin irritation.     For questions, please contact your therapist at:  [facility] [place] [address]  [address] [place], [place], [address] Phone: [phone]   Fax:  [phone]     

## 2020-04-03 NOTE — Therapy (Signed)
[facility] [place] [address]  [place], [place], [address] Phone: [phone]   Fax:  [phone]  Physical Therapy Treatment  Patient Details  Name: Hailey Noble MRN: [id number] Date of Birth: Jan 01, 1966 Referring Provider (PT): Clearance Coots, MD   Encounter Date: 04/03/2020  PT End of Session - 04/03/20 1754    Visit Number  3    Number of Visits  7    Date for PT Re-Evaluation  05/02/20    Authorization Type  [facility]    PT Start Time  1700    PT Stop Time  [id number]    PT Time Calculation (min)  48 min    Activity Tolerance  Patient tolerated treatment well    Behavior During Therapy  [facility] for tasks assessed/performed       Past Medical History:  Diagnosis Date  . Allergic state 12/08/2016  . Allergy   . Anxiety   . Back pain    " i just have a weak loer back"   . Fibroids   . GERD (gastroesophageal reflux disease)   . Hyperlipidemia 06/09/2017  . Insulin resistance   . Joint pain   . Knee pain, right 03/10/2017  . Plantar fasciitis   . PONV (postoperative nausea and vomiting)    just nausea   . Shortness of breath    not only if  i exert myselg   . Sinusitis   . Vitamin D deficiency 11/22/2016  . Vitamin D deficiency     Past Surgical History:  Procedure Laterality Date  . ABDOMINAL HYSTERECTOMY  09/2019   [place]  . COLONOSCOPY  02-2009   with Henrene Pastor normal  . CYSTOSCOPY N/A 10/12/2019   Procedure: CYSTOSCOPY;  Surgeon: Sherlyn Hay, DO;  Location: [place];  Service: Gynecology;  Laterality: N/A;  . ESOPHAGOGASTRODUODENOSCOPY  2012  . GANGLION CYST EXCISION Right    right- wrist   . NASAL SEPTUM SURGERY    . NASAL SINUS SURGERY    . TMJ ARTHROSCOPY     left  . TOTAL LAPAROSCOPIC HYSTERECTOMY WITH BILATERAL SALPINGO OOPHORECTOMY Bilateral 10/12/2019   Procedure: TOTAL LAPAROSCOPIC HYSTERECTOMY WITH BILATERAL SALPINGO OOPHORECTOMY, Lysis Pelvic Adhseions ;  Surgeon: Sherlyn Hay, DO;  Location: [place];  Service: Gynecology;  Laterality: Bilateral;    There were no vitals filed for this visit.  Subjective Assessment - 04/03/20 1701    Subjective  Noticing some "pulling" in her L ankle when descending steps.    Pertinent History  SOB, plantar fasciitis, R knee pain, HLD, GERD, back pain, anxiety, L TMJ arthroscopy    Diagnostic tests  03/01/20 L ankle [name]: Findings would suggest peroneal tendinopathy    Patient Stated Goals  "work on strength"    Currently in Pain?  No/denies                       [facility] Adult PT Treatment/Exercise - 04/03/20 0001      Manual Therapy   Manual Therapy  Taping    Kinesiotex  Inhibit Muscle      Kinesiotix   Inhibit Muscle   L peroneal tendinitis pattern- 50% strip from medial arch up peroneals & 50% strip from medial arch around heel and over lateral foot      Ankle Exercises: Aerobic   Stationary Bike  L2 x 6 min      Ankle Exercises: Stretches   Gastroc  Stretch  2 reps;30 seconds    Gastroc Stretch Limitations  toes on wall; each LE    Other Stretch  sitting R/L KTOS 30" each      Ankle Exercises: Sidelying   Ankle Inversion  Strengthening;Left;10 reps    Ankle Inversion Limitations  yellow TB around toes    Ankle Eversion  Strengthening;Left;10 reps    Ankle Eversion Limitations  yellow TB around toes      Ankle Exercises: Standing   Heel Raises  Both;20 reps   ball between heels   Other Standing Ankle Exercises  sidestepping with red loop around anklex 2x39ft; with yellow loop around forefoot 2x71ft   cues to avoid lateral trunk lean   Other Standing Ankle Exercises  standing R/L HS curl with red loop around ankles x15 each             PT Education - 04/03/20 1754    Education Details  edu on KT taping wear time, removal, precautions; edu on increasing walking distance incrementally to tolerance    Person(s) Educated  Patient    Methods   Explanation;Demonstration;Tactile cues;Verbal cues;Handout    Comprehension  Verbalized understanding;Returned demonstration       PT Short Term Goals - 03/27/20 1745      PT SHORT TERM GOAL #1   Title  Patient to be independent with initial HEP.    Time  3    Period  Weeks    Status  On-going    Target Date  04/11/20        PT Long Term Goals - 03/27/20 1746      PT LONG TERM GOAL #1   Title  Patient to be independent with advanced HEP.    Time  6    Period  Weeks    Status  On-going      PT LONG TERM GOAL #2   Title  Patient to demonstrate pain-free L ankle strength testing with >/=4+/5 strength.    Time  6    Period  Weeks    Status  On-going      PT LONG TERM GOAL #3   Title  Patient to demonstrate L ankle AROM WNL and pain-free.    Time  6    Period  Weeks    Status  On-going      PT LONG TERM GOAL #4   Title  Patient to demonstrate L SLS on foam for 30 sec with mild-moderate sway.    Time  6    Period  Weeks    Status  On-going      PT LONG TERM GOAL #5   Title  Patient to report 70% improvement in pain/discomfort when walking on graded treadmill for 1 hour.    Time  6    Period  Weeks    Status  On-going            Plan - 04/03/20 1755    Clinical Impression Statement  Patient noting "pulling" sensation in L ankle when descending stairs. Reported good understanding of recent HEP update and denied questions. Increased resistance with sidelying ankle inversion and eversion with good tolerance of resistance and good ability to maintain form without compensations. Worked on resisted hip strengthening with patient reporting muscle burn which was eased with stretching. Ended session with KT tape to L ankle for pain relief. Patient reported understanding of KT tape edu sheet and without complaints at end of session.    Comorbidities  SOB,  plantar fasciitis, R knee pain, HLD, GERD, back pain, anxiety, L TMJ arthroscopy    PT Treatment/Interventions  ADLs/Self  Care Home Management;Cryotherapy;Electrical Stimulation;Iontophoresis 4mg /ml Dexamethasone;Moist Heat;Balance training;Therapeutic exercise;Therapeutic activities;Functional mobility training;Stair training;Gait training;Ultrasound;Neuromuscular re-education;Patient/family education;Wheelchair mobility training;Manual techniques;Vestibular;Vasopneumatic Device;Taping;Energy conservation;Dry needling;Passive range of motion    PT Next Visit Plan  progress ankle stability and strengthening    Consulted and Agree with Plan of Care  Patient       Patient will benefit from skilled therapeutic intervention in order to improve the following deficits and impairments:  Decreased activity tolerance, Pain, Increased fascial restricitons, Decreased balance, Difficulty walking, Decreased range of motion, Improper body mechanics, Postural dysfunction, Impaired flexibility  Visit Diagnosis: Pain in left ankle and joints of left foot  Stiffness of left ankle, not elsewhere classified  Difficulty in walking, not elsewhere classified     Problem List Patient Active Problem List   Diagnosis Date Noted  . Peroneal tendinitis of left lower leg 02/16/2020  . Leukocytosis 12/02/2019  . Hyperglycemia 12/02/2019  . Pain of left heel 12/02/2019  . Pelvic pain 10/12/2019  . Post-operative state 10/12/2019  . Pain, abdominal, RLQ 11/28/2017  . Insulin resistance 10/21/2017  . Hyperlipidemia 06/09/2017  . Knee pain, right 03/10/2017  . Allergic state 12/08/2016  . Vitamin D deficiency 11/22/2016  . Depression with anxiety 04/06/2014  . Obesity (BMI 30-39.9) 04/06/2014  . Preventative health care 06/17/2011  . INGUINAL PAIN, LEFT 12/05/2008    Janene Harvey, PT, DPT 04/03/20 5:59 PM   [facility] [place] [address]  [place], [place], [address] Phone: [phone]   Fax:  [phone]  Name: BERTINA OGLE MRN: [id number] Date of Birth:  [birth year]

## 2020-04-10 ENCOUNTER — Encounter: Payer: 59 | Admitting: Physical Therapy

## 2020-04-10 ENCOUNTER — Ambulatory Visit (INDEPENDENT_AMBULATORY_CARE_PROVIDER_SITE_OTHER): Payer: 59 | Admitting: Physician Assistant

## 2020-04-10 ENCOUNTER — Other Ambulatory Visit: Payer: Self-pay

## 2020-04-10 ENCOUNTER — Encounter (INDEPENDENT_AMBULATORY_CARE_PROVIDER_SITE_OTHER): Payer: Self-pay | Admitting: Physician Assistant

## 2020-04-10 VITALS — BP 122/74 | HR 80 | Temp 98.0°F | Ht 65.0 in | Wt 207.0 lb

## 2020-04-10 DIAGNOSIS — Z9189 Other specified personal risk factors, not elsewhere classified: Secondary | ICD-10-CM | POA: Diagnosis not present

## 2020-04-10 DIAGNOSIS — E669 Obesity, unspecified: Secondary | ICD-10-CM | POA: Diagnosis not present

## 2020-04-10 DIAGNOSIS — E8881 Metabolic syndrome: Secondary | ICD-10-CM | POA: Diagnosis not present

## 2020-04-10 DIAGNOSIS — E559 Vitamin D deficiency, unspecified: Secondary | ICD-10-CM

## 2020-04-10 DIAGNOSIS — Z6834 Body mass index (BMI) 34.0-34.9, adult: Secondary | ICD-10-CM

## 2020-04-10 NOTE — Progress Notes (Signed)
Chief Complaint:   Taylor is here to discuss her progress with her obesity treatment plan along with follow-up of her obesity related diagnoses. Norberta is on the Category 3 Plan and states she is following her eating plan approximately 90% of the time. Alyiana states she is walking/swimming 20-60 minutes 4 times per week.  Today's visit was #: 22 Starting weight: 212 lbs Starting date: 10/06/2017 Today's weight: 207 lbs Today's date: 04/10/2020 Total lbs lost to date: 5 Total lbs lost since last in-office visit: 2  Interim History: Bentlee states that her father is doing better and is in rehab. She has been eating a lot of cottage cheese, yogurt, and tuna.  Subjective:   Insulin resistance. Harlee has a diagnosis of insulin resistance based on her elevated fasting insulin level >5. She continues to work on diet and exercise to decrease her risk of diabetes. She is on metformin. No nausea, vomiting, or diarrhea. No polyphagia.  Lab Results  Component Value Date   INSULIN 9.3 12/30/2018   INSULIN 11.1 09/21/2018   INSULIN 8.7 04/30/2018   INSULIN 8.8 10/06/2017   Lab Results  Component Value Date   HGBA1C 5.4 12/02/2019   Vitamin D deficiency. Murel is on Vitamin D. No nausea, vomiting, or muscle weakness. She is due for labs. Last Vitamin D 37.98 on 12/02/2019.  At risk for diabetes mellitus. Vennesa is at higher than average risk for developing diabetes due to her obesity.   Assessment/Plan:   Insulin resistance. Aman will continue to work on weight loss, exercise, and decreasing simple carbohydrates to help decrease the risk of diabetes. Shi agreed to follow-up with [name] as directed to closely monitor her progress. She will continue her metformin as directed. Comprehensive metabolic panel, Insulin, random labs ordered.  Vitamin D deficiency. Low Vitamin D level contributes to fatigue and are associated with obesity, breast, and colon cancer. She agrees to  continue to take Vitamin D and VITAMIN D 25 Hydroxy (Vit-D Deficiency, Fractures) level was ordered today.  At risk for diabetes mellitus. Maleeha was given approximately 15 minutes of diabetes education and counseling today. We discussed intensive lifestyle modifications today with an emphasis on weight loss as well as increasing exercise and decreasing simple carbohydrates in her diet. We also reviewed medication options with an emphasis on risk versus benefit of those discussed.   Repetitive spaced learning was employed today to elicit superior memory formation and behavioral change.  Class 1 obesity with serious comorbidity and body mass index (BMI) of 34.0 to 34.9 in adult, unspecified obesity type.  Korrah is currently in the action stage of change. As such, her goal is to continue with weight loss efforts. She has agreed to keeping a food journal and adhering to recommended goals of 1300-1500 calories and 95 grams of protein daily.   Exercise goals: For substantial health benefits, adults should do at least 150 minutes (2 hours and 30 minutes) a week of moderate-intensity, or 75 minutes (1 hour and 15 minutes) a week of vigorous-intensity aerobic physical activity, or an equivalent combination of moderate- and vigorous-intensity aerobic activity. Aerobic activity should be performed in episodes of at least 10 minutes, and preferably, it should be spread throughout the week.  Behavioral modification strategies: no skipping meals and meal planning and cooking strategies.  Kalianna has agreed to follow-up with our clinic in 2 weeks. She was informed of the importance of frequent follow-up visits to maximize her success with intensive lifestyle modifications  for her multiple health conditions.   Seniqua was informed we would discuss her lab results at her next visit unless there is a critical issue that needs to be addressed sooner. Abygael agreed to keep her next visit at the agreed upon time to  discuss these results.  Objective:   Blood pressure 122/74, pulse 80, temperature 98 F (36.7 C), temperature source Oral, height 5\' 5"  (1.651 m), weight 207 lb (93.9 kg), last menstrual period 11/22/2018, SpO2 96 %. Body mass index is 34.45 kg/m.  General: Cooperative, alert, well developed, in no acute distress. HEENT: Conjunctivae and lids unremarkable. Cardiovascular: Regular rhythm.  Lungs: Normal work of breathing. Neurologic: No focal deficits.   Lab Results  Component Value Date   CREATININE 0.50 12/02/2019   BUN 20 12/02/2019   NA 138 12/02/2019   K 4.3 12/02/2019   CL 106 12/02/2019   CO2 25 12/02/2019   Lab Results  Component Value Date   ALT 18 12/02/2019   AST 16 12/02/2019   ALKPHOS 141 (H) 12/02/2019   BILITOT 0.4 12/02/2019   Lab Results  Component Value Date   HGBA1C 5.4 12/02/2019   HGBA1C 5.3 10/08/2019   HGBA1C 5.4 06/16/2019   HGBA1C 5.4 12/30/2018   HGBA1C 5.4 09/21/2018   Lab Results  Component Value Date   INSULIN 9.3 12/30/2018   INSULIN 11.1 09/21/2018   INSULIN 8.7 04/30/2018   INSULIN 8.8 10/06/2017   Lab Results  Component Value Date   TSH 1.37 12/02/2019   Lab Results  Component Value Date   CHOL 161 12/02/2019   HDL 44.60 12/02/2019   LDLCALC 92 12/02/2019   TRIG 122.0 12/02/2019   CHOLHDL 4 12/02/2019   Lab Results  Component Value Date   WBC 7.1 12/02/2019   HGB 13.6 12/02/2019   HCT 40.9 12/02/2019   MCV 89.8 12/02/2019   PLT 333.0 12/02/2019   Lab Results  Component Value Date   FERRITIN 50 11/09/2008   Attestation Statements:   Reviewed by clinician on day of visit: allergies, medications, problem list, medical history, surgical history, family history, social history, and previous encounter notes.  IMichaelene Song, am acting as transcriptionist for Abby Potash, PA-C   I have reviewed the above documentation for accuracy and completeness, and I agree with the above. Abby Potash, PA-C

## 2020-04-11 LAB — COMPREHENSIVE METABOLIC PANEL
ALT: 19 IU/L (ref 0–32)
AST: 19 IU/L (ref 0–40)
Albumin/Globulin Ratio: 2 (ref 1.2–2.2)
Albumin: 4.5 g/dL (ref 3.8–4.9)
Alkaline Phosphatase: 157 IU/L — ABNORMAL HIGH (ref 39–117)
BUN/Creatinine Ratio: 32 — ABNORMAL HIGH (ref 9–23)
BUN: 19 mg/dL (ref 6–24)
Bilirubin Total: 0.2 mg/dL (ref 0.0–1.2)
CO2: 23 mmol/L (ref 20–29)
Calcium: 9.5 mg/dL (ref 8.7–10.2)
Chloride: 105 mmol/L (ref 96–106)
Creatinine, Ser: 0.59 mg/dL (ref 0.57–1.00)
GFR calc Af Amer: 120 mL/min/{1.73_m2} (ref >59)
GFR calc non Af Amer: 104 mL/min/{1.73_m2} (ref >59)
Globulin, Total: 2.3 g/dL (ref 1.5–4.5)
Glucose: 85 mg/dL (ref 65–99)
Potassium: 4.5 mmol/L (ref 3.5–5.2)
Sodium: 140 mmol/L (ref 134–144)
Total Protein: 6.8 g/dL (ref 6.0–8.5)

## 2020-04-11 LAB — INSULIN, RANDOM: INSULIN: 10.1 u[IU]/mL (ref 2.6–24.9)

## 2020-04-11 LAB — VITAMIN D 25 HYDROXY (VIT D DEFICIENCY, FRACTURES): Vit D, 25-Hydroxy: 40.2 ng/mL (ref 30.0–100.0)

## 2020-04-12 ENCOUNTER — Ambulatory Visit: Payer: 59

## 2020-04-17 ENCOUNTER — Encounter: Payer: Self-pay | Admitting: Physical Therapy

## 2020-04-17 ENCOUNTER — Other Ambulatory Visit: Payer: Self-pay

## 2020-04-17 ENCOUNTER — Ambulatory Visit: Payer: 59 | Admitting: Physical Therapy

## 2020-04-17 DIAGNOSIS — M25572 Pain in left ankle and joints of left foot: Secondary | ICD-10-CM | POA: Diagnosis not present

## 2020-04-17 DIAGNOSIS — R262 Difficulty in walking, not elsewhere classified: Secondary | ICD-10-CM

## 2020-04-17 DIAGNOSIS — M25672 Stiffness of left ankle, not elsewhere classified: Secondary | ICD-10-CM

## 2020-04-17 NOTE — Therapy (Signed)
[name] [place] [address]  [place], [place], [address] Phone: [phone]   Fax:  [phone]  Physical Therapy Treatment  Patient Details  Name: Hailey Noble MRN: [id number] Date of Birth: 1966-06-01 Referring Provider (PT): Clearance Coots, MD   Encounter Date: 04/17/2020  PT End of Session - 04/17/20 1750    Visit Number  4    Number of Visits  7    Date for PT Re-Evaluation  05/02/20    Authorization Type  [facility]    PT Start Time  1700    PT Stop Time  5859    PT Time Calculation (min)  47 min    Activity Tolerance  Patient tolerated treatment well    Behavior During Therapy  [facility] for tasks assessed/performed       Past Medical History:  Diagnosis Date  . Allergic state 12/08/2016  . Allergy   . Anxiety   . Back pain    " i just have a weak loer back"   . Fibroids   . GERD (gastroesophageal reflux disease)   . Hyperlipidemia 06/09/2017  . Insulin resistance   . Joint pain   . Knee pain, right 03/10/2017  . Plantar fasciitis   . PONV (postoperative nausea and vomiting)    just nausea   . Shortness of breath    not only if  i exert myselg   . Sinusitis   . Vitamin D deficiency 11/22/2016  . Vitamin D deficiency     Past Surgical History:  Procedure Laterality Date  . ABDOMINAL HYSTERECTOMY  09/2019   [place]  . COLONOSCOPY  02-2009   with Henrene Pastor normal  . CYSTOSCOPY N/A 10/12/2019   Procedure: CYSTOSCOPY;  Surgeon: Sherlyn Hay, DO;  Location: [place];  Service: Gynecology;  Laterality: N/A;  . ESOPHAGOGASTRODUODENOSCOPY  2012  . GANGLION CYST EXCISION Right    right- wrist   . NASAL SEPTUM SURGERY    . NASAL SINUS SURGERY    . TMJ ARTHROSCOPY     left  . TOTAL LAPAROSCOPIC HYSTERECTOMY WITH BILATERAL SALPINGO OOPHORECTOMY Bilateral 10/12/2019   Procedure: TOTAL LAPAROSCOPIC HYSTERECTOMY WITH BILATERAL SALPINGO OOPHORECTOMY, Lysis Pelvic Adhseions ;  Surgeon: Sherlyn Hay, DO;  Location: [facility];  Service: Gynecology;  Laterality: Bilateral;    There were no vitals filed for this visit.  Subjective Assessment - 04/17/20 1701    Subjective  Has been practicing going down the steps with decreased pain. Has been walking on the treadmill without an incline and that has been going well. Is planning on walking 20 minutes this week at [facility]. Reports good benefit from KT tape.    Pertinent History  SOB, plantar fasciitis, R knee pain, HLD, GERD, back pain, anxiety, L TMJ arthroscopy    Diagnostic tests  03/01/20 L ankle [place] Findings would suggest peroneal tendinopathy    Patient Stated Goals  "work on strength"    Currently in Pain?  No/denies                       [facility] - [facility]      Ambulation/Gait   Stairs  Yes    Stairs Assistance  7: Independent    Stair Management Technique  No rails    Number of Stairs  28    Height of Stairs  7    Gait Comments  c/o mild  pain in the L posterior lateral malleolus; good eccentric control and speed when descending      Manual Therapy   Manual Therapy  Taping    Kinesiotex  Inhibit Muscle      Kinesiotix   Inhibit Muscle   L peroneal tendinitis pattern- 50% strip from medial arch up peroneals & 50% strip from medial arch around heel and over lateral foot      Ankle Exercises: Aerobic   Stationary Bike  L2 x 6 min      Ankle Exercises: Stretches   Soleus Stretch  30 seconds;2 reps    Soleus Stretch Limitations  at counter top    Gastroc Stretch  30 seconds;3 reps    Gastroc Stretch Limitations  toes on wall; each LE      Ankle Exercises: Seated   Other Seated Ankle Exercises  L ankle circles CW/CCW x10 each   cues for increased ROM and slower speed     Ankle Exercises: Standing   SLS  L SLS + 10# dumbbell pass; 2 sets   cues for core and glute contraction   Braiding (Round Trip)  L lateral stepdown with heel touch- last 10 reps  with beanbag under toes    Tai Chi  L LE step up/over with 4" step  x10             PT Education - 04/17/20 1750    Education Details  update to HEP    Person(s) Educated  Patient    Methods  Explanation;Demonstration;Tactile cues;Handout;Verbal cues    Comprehension  Verbalized understanding;Returned demonstration       PT Short Term Goals - 04/17/20 1755      PT SHORT TERM GOAL #1   Title  Patient to be independent with initial HEP.    Time  3    Period  Weeks    Status  Achieved    Target Date  04/11/20        PT Long Term Goals - 03/27/20 1746      PT LONG TERM GOAL #1   Title  Patient to be independent with advanced HEP.    Time  6    Period  Weeks    Status  On-going      PT LONG TERM GOAL #2   Title  Patient to demonstrate pain-free L ankle strength testing with >/=4+/5 strength.    Time  6    Period  Weeks    Status  On-going      PT LONG TERM GOAL #3   Title  Patient to demonstrate L ankle AROM WNL and pain-free.    Time  6    Period  Weeks    Status  On-going      PT LONG TERM GOAL #4   Title  Patient to demonstrate L SLS on foam for 30 sec with mild-moderate sway.    Time  6    Period  Weeks    Status  On-going      PT LONG TERM GOAL #5   Title  Patient to report 70% improvement in pain/discomfort when walking on graded treadmill for 1 hour.    Time  6    Period  Weeks    Status  On-going            Plan - 04/17/20 1750    Clinical Impression Statement  Patient reporting slight improvement in L ankle pain with descending stairs. Has been able to walk  on an ungraded treadmill without pain. Noted good improvement from KT tape. Worked on gentle L calf stretching with minor correction of positioning for max benefit. Assessed pain with descending steps- patient reported mild pain at posterior lateral malleolus but noting that she no longer has pain in the Achilles with this activity. Still with c/o discomfort in the ankle with short step  up/downs but with better tolerance with lateral step down. Able to perform with the toes slightly elevated on a beanbag to increase dorsiflexion without increased pain. Able to perform dynamic ankle stability exercises with good focus and no c/o pain. Intermittent LOB demonstrated d/t ankle and hip instability. Ended session with KT tape to L ankle for continued pain relief. Patient without complaints at end of session.    Comorbidities  SOB, plantar fasciitis, R knee pain, HLD, GERD, back pain, anxiety, L TMJ arthroscopy    PT Treatment/Interventions  ADLs/Self Care Home Management;Cryotherapy;Electrical Stimulation;Iontophoresis '4mg'$ /ml Dexamethasone;Moist Heat;Balance training;Therapeutic exercise;Therapeutic activities;Functional mobility training;Stair training;Gait training;Ultrasound;Neuromuscular re-education;Patient/family education;Wheelchair mobility training;Manual techniques;Vestibular;Vasopneumatic Device;Taping;Energy conservation;Dry needling;Passive range of motion    PT Next Visit Plan  progress ankle stability and strengthening    Consulted and Agree with Plan of Care  Patient       Patient will benefit from skilled therapeutic intervention in order to improve the following deficits and impairments:  Decreased activity tolerance, Pain, Increased fascial restricitons, Decreased balance, Difficulty walking, Decreased range of motion, Improper body mechanics, Postural dysfunction, Impaired flexibility  Visit Diagnosis: Pain in left ankle and joints of left foot  Stiffness of left ankle, not elsewhere classified  Difficulty in walking, not elsewhere classified     Problem List Patient Active Problem List   Diagnosis Date Noted  . Peroneal tendinitis of left lower leg 02/16/2020  . Leukocytosis 12/02/2019  . Hyperglycemia 12/02/2019  . Pain of left heel 12/02/2019  . Pelvic pain 10/12/2019  . Post-operative state 10/12/2019  . Pain, abdominal, RLQ 11/28/2017  . Insulin  resistance 10/21/2017  . Hyperlipidemia 06/09/2017  . Knee pain, right 03/10/2017  . Allergic state 12/08/2016  . Vitamin D deficiency 11/22/2016  . Depression with anxiety 04/06/2014  . Obesity (BMI 30-39.9) 04/06/2014  . Preventative health care 06/17/2011  . INGUINAL PAIN, LEFT 12/05/2008     Janene Harvey, PT, DPT 04/17/20 5:56 PM   [facility] [address]  [place], [place], [address] Phone: [phone]   Fax:  [phone]  Name: Hailey Noble MRN: [id number] Date of Birth: July 14, 1966

## 2020-04-19 ENCOUNTER — Encounter: Payer: 59 | Admitting: Physical Therapy

## 2020-04-24 ENCOUNTER — Other Ambulatory Visit: Payer: Self-pay

## 2020-04-24 ENCOUNTER — Ambulatory Visit (INDEPENDENT_AMBULATORY_CARE_PROVIDER_SITE_OTHER): Payer: 59 | Admitting: Physician Assistant

## 2020-04-24 ENCOUNTER — Encounter (INDEPENDENT_AMBULATORY_CARE_PROVIDER_SITE_OTHER): Payer: Self-pay | Admitting: Physician Assistant

## 2020-04-24 VITALS — BP 134/66 | HR 86 | Temp 98.0°F | Ht 65.0 in | Wt 210.0 lb

## 2020-04-24 DIAGNOSIS — E669 Obesity, unspecified: Secondary | ICD-10-CM | POA: Diagnosis not present

## 2020-04-24 DIAGNOSIS — Z6834 Body mass index (BMI) 34.0-34.9, adult: Secondary | ICD-10-CM

## 2020-04-24 DIAGNOSIS — E559 Vitamin D deficiency, unspecified: Secondary | ICD-10-CM

## 2020-04-24 NOTE — Progress Notes (Signed)
Chief Complaint:   Hailey Noble is here to discuss her progress with her obesity treatment plan along with follow-up of her obesity related diagnoses. Hailey Noble is keeping a food journal and adhering to recommended goals of 1300-1500 calories and 95 grams of protein and states she is following her eating plan approximately 90% of the time. Hailey Noble states she is doing cardio/treadmill 20 minutes 3 times per week.  Today's visit was #: 52 Starting weight: 212 lbs Starting date: 10/06/2017 Today's weight: 210 lbs Today's date: 04/24/2020 Total lbs lost to date: 2 Total lbs lost since last in-office visit: 0  Interim History: Hailey Noble states that she ate [place] with her mom twice over the last 2 weeks and found that it triggered her to want other things as well. She is traveling to the beach in 2 weeks.  Subjective:   Vitamin D deficiency. Hailey Noble is on Vitamin D. No nausea, vomiting, or muscle weakness. Last Vitamin D 40.2 on 04/10/2020.  Assessment/Plan:   Vitamin D deficiency. Low Vitamin D level contributes to fatigue and are associated with obesity, breast, and colon cancer. She agrees to continue to take Vitamin D and will follow-up for routine testing of Vitamin D, at least 2-3 times per year to avoid over-replacement.  Class 1 obesity with serious comorbidity and body mass index (BMI) of 34.0 to 34.9 in adult, unspecified obesity type.  Hailey Noble is currently in the action stage of change. As such, her goal is to continue with weight loss efforts. She has agreed to keeping a food journal and adhering to recommended goals of 1300-1500 calories and 95 grams of protein daily.   Exercise goals: For substantial health benefits, adults should do at least 150 minutes (2 hours and 30 minutes) a week of moderate-intensity, or 75 minutes (1 hour and 15 minutes) a week of vigorous-intensity aerobic physical activity, or an equivalent combination of moderate- and vigorous-intensity aerobic  activity. Aerobic activity should be performed in episodes of at least 10 minutes, and preferably, it should be spread throughout the week.  Behavioral modification strategies: meal planning and cooking strategies and keeping a strict food journal.  Levon has agreed to follow-up with our clinic in 3 weeks. She was informed of the importance of frequent follow-up visits to maximize her success with intensive lifestyle modifications for her multiple health conditions.   Objective:   Blood pressure 134/66, pulse 86, temperature 98 F (36.7 C), temperature source Oral, height 5\' 5"  (1.651 m), weight 210 lb (95.3 kg), last menstrual period 11/22/2018, SpO2 95 %. Body mass index is 34.95 kg/m.  General: Cooperative, alert, well developed, in no acute distress. HEENT: Conjunctivae and lids unremarkable. Cardiovascular: Regular rhythm.  Lungs: Normal work of breathing. Neurologic: No focal deficits.   Lab Results  Component Value Date   CREATININE 0.59 04/10/2020   BUN 19 04/10/2020   NA 140 04/10/2020   K 4.5 04/10/2020   CL 105 04/10/2020   CO2 23 04/10/2020   Lab Results  Component Value Date   ALT 19 04/10/2020   AST 19 04/10/2020   ALKPHOS 157 (H) 04/10/2020   BILITOT 0.2 04/10/2020   Lab Results  Component Value Date   HGBA1C 5.4 12/02/2019   HGBA1C 5.3 10/08/2019   HGBA1C 5.4 06/16/2019   HGBA1C 5.4 12/30/2018   HGBA1C 5.4 09/21/2018   Lab Results  Component Value Date   INSULIN 10.1 04/10/2020   INSULIN 9.3 12/30/2018   INSULIN 11.1 09/21/2018   INSULIN  8.7 04/30/2018   INSULIN 8.8 10/06/2017   Lab Results  Component Value Date   TSH 1.37 12/02/2019   Lab Results  Component Value Date   CHOL 161 12/02/2019   HDL 44.60 12/02/2019   LDLCALC 92 12/02/2019   TRIG 122.0 12/02/2019   CHOLHDL 4 12/02/2019   Lab Results  Component Value Date   WBC 7.1 12/02/2019   HGB 13.6 12/02/2019   HCT 40.9 12/02/2019   MCV 89.8 12/02/2019   PLT 333.0 12/02/2019    Lab Results  Component Value Date   FERRITIN 50 11/09/2008   Attestation Statements:   Reviewed by clinician on day of visit: allergies, medications, problem list, medical history, surgical history, family history, social history, and previous encounter notes.  Time spent on visit including pre-visit chart review and post-visit charting and care was 25 minutes.   IMichaelene Song, am acting as transcriptionist for Abby Potash, PA-C   I have reviewed the above documentation for accuracy and completeness, and I agree with the above. Abby Potash, PA-C

## 2020-04-25 NOTE — Progress Notes (Signed)
Hailey Noble - 54 y.o. female MRN [id number]  Date of birth: 12-11-1966  SUBJECTIVE:  Including CC & ROS.  Chief Complaint  Patient presents with  . Follow-up    follow up for left ankle    Hailey Noble is a 54 y.o. female that is following up for her peroneal tendinitis.  She is doing well and is able to walk on the treadmill.  She only notices pain with transitioning from sitting to standing.  It seems to be occurring intermittently over the posterior aspect of the lateral malleolus.  Denies any loss of strength.  No numbness or tingling.   Review of Systems See HPI   HISTORY: Past Medical, Surgical, Social, and Family History Reviewed & Updated per EMR.   Pertinent Historical Findings include:  Past Medical History:  Diagnosis Date  . Allergic state 12/08/2016  . Allergy   . Anxiety   . Back pain    " i just have a weak loer back"   . Fibroids   . GERD (gastroesophageal reflux disease)   . Hyperlipidemia 06/09/2017  . Insulin resistance   . Joint pain   . Knee pain, right 03/10/2017  . Plantar fasciitis   . PONV (postoperative nausea and vomiting)    just nausea   . Shortness of breath    not only if  i exert myselg   . Sinusitis   . Vitamin D deficiency 11/22/2016  . Vitamin D deficiency     Past Surgical History:  Procedure Laterality Date  . ABDOMINAL HYSTERECTOMY  09/2019   [place]  . COLONOSCOPY  02-2009   with Henrene Pastor normal  . CYSTOSCOPY N/A 10/12/2019   Procedure: CYSTOSCOPY;  Surgeon: Sherlyn Hay, DO;  Location: [facility];  Service: Gynecology;  Laterality: N/A;  . ESOPHAGOGASTRODUODENOSCOPY  2012  . GANGLION CYST EXCISION Right    right- wrist   . NASAL SEPTUM SURGERY    . NASAL SINUS SURGERY    . TMJ ARTHROSCOPY     left  . TOTAL LAPAROSCOPIC HYSTERECTOMY WITH BILATERAL SALPINGO OOPHORECTOMY Bilateral 10/12/2019   Procedure: TOTAL LAPAROSCOPIC HYSTERECTOMY WITH BILATERAL SALPINGO OOPHORECTOMY, Lysis Pelvic Adhseions ;   Surgeon: Sherlyn Hay, DO;  Location: [facility];  Service: Gynecology;  Laterality: Bilateral;    Family History  Problem Relation Age of Onset  . Hypertension Father   . Diabetes Father   . Hyperlipidemia Father   . Breast cancer Mother 79  . Cancer Mother        breast  . Thyroid disease Mother   . Sleep apnea Mother   . Colon cancer Paternal Grandfather        died at age 35  . Cancer Paternal Grandfather        GI CA  . Obesity Sister   . Diabetes Sister   . Hypertension Sister   . Heart disease Maternal Grandmother        MI  . Cancer Maternal Grandfather        lung, smoker  . Diabetes Maternal Grandfather   . Diabetes Paternal Grandmother   . Heart disease Paternal Grandmother   . Pancreatitis Sister   . Esophageal cancer Neg Hx   . Stomach cancer Neg Hx   . Stroke Neg Hx   . Colon polyps Neg Hx   . Rectal cancer Neg Hx   . Pancreatic cancer Neg Hx     Social History   Socioeconomic History  .  Marital status: Single    Spouse name: Not on file  . Number of children: Not on file  . Years of education: Not on file  . Highest education level: Not on file  Occupational History  . Occupation: Programmer, multimedia: [employer]  Tobacco Use  . Smoking status: Never Smoker  . Smokeless tobacco: Never Used  Substance and Sexual Activity  . Alcohol use: Yes    Alcohol/week: 0.0 standard drinks    Comment: rare  . Drug use: No  . Sexual activity: Yes    Birth control/protection: I.U.D.  Other Topics Concern  . 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 Works at [employer]. No dietary restrictions. Lives with dog   Social Determinants of Health   Financial Resource Strain:   . Difficulty of Paying Living Expenses:   Food Insecurity:   . Worried About Charity fundraiser in the Last Year:   . Arboriculturist in the Last Year:   Transportation Needs:   . Lexicographer (Medical):   Marland Kitchen Lack of Transportation (Non-Medical):   Physical Activity:   . Days of Exercise per Week:   . Minutes of Exercise per Session:   Stress:   . Feeling of Stress :   Social Connections:   . Frequency of Communication with Friends and Family:   . Frequency of Social Gatherings with Friends and Family:   . Attends Religious Services:   . Active Member of Clubs or Organizations:   . Attends Archivist Meetings:   Marland Kitchen Marital Status:   Intimate Partner Violence:   . Fear of Current or Ex-Partner:   . Emotionally Abused:   Marland Kitchen Physically Abused:   . Sexually Abused:      PHYSICAL EXAM:  VS: BP 117/72   Pulse 88   Ht 5\' 5"  (1.651 m)   Wt 208 lb (94.3 kg)   LMP 11/22/2018 (Approximate)   BMI 34.61 kg/m  Physical Exam Gen: NAD, alert, cooperative with exam, well-appearing MSK:  Left ankle: Improved swelling. No redness. Some tenderness to palpation in the posterior aspect of the lateral malleolus. Good range of motion. Neurovascular intact     ASSESSMENT & PLAN:   Peroneal tendinitis of left lower leg Has had improvement and only notices it with certain transitions.  It may be more of a posterior impingement as well. -Counseled on home exercise therapy and supportive care. -Provided heel lifts. -Provided Duexis samples. -If still ongoing could consider either retrocalcaneal injection or peroneal tendon injection.

## 2020-04-26 ENCOUNTER — Encounter: Payer: Self-pay | Admitting: Family Medicine

## 2020-04-26 ENCOUNTER — Encounter: Payer: Self-pay | Admitting: Physical Therapy

## 2020-04-26 ENCOUNTER — Ambulatory Visit (INDEPENDENT_AMBULATORY_CARE_PROVIDER_SITE_OTHER): Payer: 59 | Admitting: Family Medicine

## 2020-04-26 ENCOUNTER — Ambulatory Visit: Payer: 59 | Attending: Family Medicine | Admitting: Physical Therapy

## 2020-04-26 ENCOUNTER — Other Ambulatory Visit: Payer: Self-pay

## 2020-04-26 DIAGNOSIS — M7672 Peroneal tendinitis, left leg: Secondary | ICD-10-CM | POA: Diagnosis not present

## 2020-04-26 DIAGNOSIS — M25672 Stiffness of left ankle, not elsewhere classified: Secondary | ICD-10-CM | POA: Insufficient documentation

## 2020-04-26 DIAGNOSIS — R262 Difficulty in walking, not elsewhere classified: Secondary | ICD-10-CM | POA: Diagnosis not present

## 2020-04-26 DIAGNOSIS — M25572 Pain in left ankle and joints of left foot: Secondary | ICD-10-CM | POA: Insufficient documentation

## 2020-04-26 NOTE — Assessment & Plan Note (Signed)
Has had improvement and only notices it with certain transitions.  It may be more of a posterior impingement as well. -Counseled on home exercise therapy and supportive care. -Provided heel lifts. -Provided Duexis samples. -If still ongoing could consider either retrocalcaneal injection or peroneal tendon injection.

## 2020-04-26 NOTE — Therapy (Signed)
[facility] [address]  [place], [place], [address] Phone: [phone]   Fax:  [phone]  Physical Therapy Treatment  Patient Details  Name: Hailey Noble MRN: [id number] Date of Birth: 1966/10/04 Referring Provider (PT): Clearance Coots, MD   Encounter Date: 04/26/2020  PT End of Session - 04/26/20 0930    Visit Number  5    Number of Visits  7    Date for PT Re-Evaluation  05/02/20    Authorization Type  [facility]    PT Start Time  0856    PT Stop Time  0929    PT Time Calculation (min)  33 min    Activity Tolerance  Patient tolerated treatment well    Behavior During Therapy  [facility] for tasks assessed/performed       Past Medical History:  Diagnosis Date  . Allergic state 12/08/2016  . Allergy   . Anxiety   . Back pain    " i just have a weak loer back"   . Fibroids   . GERD (gastroesophageal reflux disease)   . Hyperlipidemia 06/09/2017  . Insulin resistance   . Joint pain   . Knee pain, right 03/10/2017  . Plantar fasciitis   . PONV (postoperative nausea and vomiting)    just nausea   . Shortness of breath    not only if  i exert myselg   . Sinusitis   . Vitamin D deficiency 11/22/2016  . Vitamin D deficiency     Past Surgical History:  Procedure Laterality Date  . ABDOMINAL HYSTERECTOMY  09/2019   [place]  . COLONOSCOPY  02-2009   with Henrene Pastor normal  . CYSTOSCOPY N/A 10/12/2019   Procedure: CYSTOSCOPY;  Surgeon: Sherlyn Hay, DO;  Location: [facility];  Service: Gynecology;  Laterality: N/A;  . ESOPHAGOGASTRODUODENOSCOPY  2012  . GANGLION CYST EXCISION Right    right- wrist   . NASAL SEPTUM SURGERY    . NASAL SINUS SURGERY    . TMJ ARTHROSCOPY     left  . TOTAL LAPAROSCOPIC HYSTERECTOMY WITH BILATERAL SALPINGO OOPHORECTOMY Bilateral 10/12/2019   Procedure: TOTAL LAPAROSCOPIC HYSTERECTOMY WITH BILATERAL SALPINGO OOPHORECTOMY, Lysis Pelvic Adhseions ;  Surgeon: Sherlyn Hay, DO;  Location: [facility];  Service: Gynecology;  Laterality: Bilateral;    There were no vitals filed for this visit.  Subjective Assessment - 04/26/20 0855    Subjective  Just saw Dr. Raeford Razor who gave her a heel lift.    Pertinent History  SOB, plantar fasciitis, R knee pain, HLD, GERD, back pain, anxiety, L TMJ arthroscopy    Diagnostic tests  03/01/20 L ankle [name]: Findings would suggest peroneal tendinopathy    Patient Stated Goals  "work on strength"    Currently in Pain?  Yes                       [facility] Adult PT Treatment/Exercise - 04/26/20 0001      Ankle Exercises: Aerobic   Stationary Bike  L2 x 6 min      Ankle Exercises: Sidelying   Ankle Inversion  Strengthening;Left;10 reps;Weights    Ankle Inversion Weights (lbs)  3#    Ankle Inversion Limitations  2x10    Ankle Eversion  Strengthening;Left;10 reps;Weights    Ankle Eversion Weights (lbs)  3#    Ankle Eversion Limitations  2x10      Ankle Exercises: Standing  Braiding (Round Trip)  L lateral stepdown with heel touch 6" with 2 beanbags under toes x10    Tai Chi  L anterior step down with heel touch 4" x10             PT Education - 04/26/20 0930    Education Details  edu on heel lift use; update to HEP- increased to 3 lbs with ankle INV/EV    Person(s) Educated  Patient    Methods  Explanation;Demonstration;Tactile cues;Verbal cues    Comprehension  Verbalized understanding;Returned demonstration       PT Short Term Goals - 04/17/20 1755      PT SHORT TERM GOAL #1   Title  Patient to be independent with initial HEP.    Time  3    Period  Weeks    Status  Achieved    Target Date  04/11/20        PT Long Term Goals - 03/27/20 1746      PT LONG TERM GOAL #1   Title  Patient to be independent with advanced HEP.    Time  6    Period  Weeks    Status  On-going      PT LONG TERM GOAL #2   Title  Patient to demonstrate pain-free L ankle strength  testing with >/=4+/5 strength.    Time  6    Period  Weeks    Status  On-going      PT LONG TERM GOAL #3   Title  Patient to demonstrate L ankle AROM WNL and pain-free.    Time  6    Period  Weeks    Status  On-going      PT LONG TERM GOAL #4   Title  Patient to demonstrate L SLS on foam for 30 sec with mild-moderate sway.    Time  6    Period  Weeks    Status  On-going      PT LONG TERM GOAL #5   Title  Patient to report 70% improvement in pain/discomfort when walking on graded treadmill for 1 hour.    Time  6    Period  Weeks    Status  On-going            Plan - 04/26/20 7416    Clinical Impression Statement  Patient reports seeing MD who advised her to try a heel lift in the L shoe. Fitted patient with heel lift given by MD with patient reporting good tolerance of it. Notes that she has tolerated walking for 30 minutes at a 1% incline very well. Worked on sidelying ankle strengthening with increased resistance today, with patient tolerating these exercises without pain. More difficulty demonstrated with eccentric portion of ankle inversion today d/t weakness. Worked on anterior and lateral steps downs with focus on eccentric control. Patient today without pain with anterior step downs, which may be attributed to use of heel lift. Patient reported understanding of HEP update today and ended session without complaints.    Comorbidities  SOB, plantar fasciitis, R knee pain, HLD, GERD, back pain, anxiety, L TMJ arthroscopy    PT Treatment/Interventions  ADLs/Self Care Home Management;Cryotherapy;Electrical Stimulation;Iontophoresis '4mg'$ /ml Dexamethasone;Moist Heat;Balance training;Therapeutic exercise;Therapeutic activities;Functional mobility training;Stair training;Gait training;Ultrasound;Neuromuscular re-education;Patient/family education;Wheelchair mobility training;Manual techniques;Vestibular;Vasopneumatic Device;Taping;Energy conservation;Dry needling;Passive range of motion     PT Next Visit Plan  progress ankle stability and strengthening    Consulted and Agree with Plan of Care  Patient  Patient will benefit from skilled therapeutic intervention in order to improve the following deficits and impairments:  Decreased activity tolerance, Pain, Increased fascial restricitons, Decreased balance, Difficulty walking, Decreased range of motion, Improper body mechanics, Postural dysfunction, Impaired flexibility  Visit Diagnosis: Pain in left ankle and joints of left foot  Stiffness of left ankle, not elsewhere classified  Difficulty in walking, not elsewhere classified     Problem List Patient Active Problem List   Diagnosis Date Noted  . Peroneal tendinitis of left lower leg 02/16/2020  . Leukocytosis 12/02/2019  . Hyperglycemia 12/02/2019  . Pain of left heel 12/02/2019  . Pelvic pain 10/12/2019  . Post-operative state 10/12/2019  . Pain, abdominal, RLQ 11/28/2017  . Insulin resistance 10/21/2017  . Hyperlipidemia 06/09/2017  . Knee pain, right 03/10/2017  . Allergic state 12/08/2016  . Vitamin D deficiency 11/22/2016  . Depression with anxiety 04/06/2014  . Obesity (BMI 30-39.9) 04/06/2014  . Preventative health care 06/17/2011  . INGUINAL PAIN, LEFT 12/05/2008     Janene Harvey, PT, DPT 04/26/20 9:40 AM   [facility] [address]  [place], [place], [address] Phone: [phone]   Fax:  [phone]  Name: SHANTINA CHRONISTER MRN: [id number] Date of Birth: Dec 22, 1966

## 2020-04-26 NOTE — Patient Instructions (Signed)
Good to see you Happy early birthday!  Please try the heel lifts  Please try the duexis as needed  Let me know if you need a letter for a standing desk  Please send me a message in [place] with any questions or updates.  Please see me back in 4 weeks.   --Dr. Raeford Razor

## 2020-04-26 NOTE — Progress Notes (Signed)
Medication Samples have been provided to the patient.  Drug name: Duexis       Strength: 800mg /26.6mg         Qty: 2 Boxes  [id number]  Exp.Date: 01/2021  Dosing instructions: Take 1 tablet by mouth three (3) times a day.  The patient has been instructed regarding the correct time, dose, and frequency of taking this medication, including desired effects and most common side effects.   Sherrie George, [place] 8:42 AM 04/26/2020

## 2020-05-01 ENCOUNTER — Ambulatory Visit: Payer: 59 | Admitting: Medical

## 2020-05-01 ENCOUNTER — Other Ambulatory Visit: Payer: Self-pay

## 2020-05-01 VITALS — BP 128/52 | HR 88 | Temp 97.4°F | Resp 18 | Ht 65.0 in | Wt 214.8 lb

## 2020-05-01 DIAGNOSIS — S50861A Insect bite (nonvenomous) of right forearm, initial encounter: Secondary | ICD-10-CM | POA: Diagnosis not present

## 2020-05-01 DIAGNOSIS — L089 Local infection of the skin and subcutaneous tissue, unspecified: Secondary | ICD-10-CM

## 2020-05-01 DIAGNOSIS — W57XXXA Bitten or stung by nonvenomous insect and other nonvenomous arthropods, initial encounter: Secondary | ICD-10-CM

## 2020-05-01 MED ORDER — SULFAMETHOXAZOLE-TRIMETHOPRIM 800-160 MG PO TABS: 1.0000 | ORAL_TABLET | Freq: Two times a day (BID) | ORAL | 0 refills | Status: DC

## 2020-05-01 MED ORDER — FLUCONAZOLE 150 MG PO TABS: 150.0000 mg | ORAL_TABLET | Freq: Once | ORAL | 0 refills | Status: AC

## 2020-05-01 NOTE — Progress Notes (Signed)
   Subjective:    Patient ID: Hailey Noble, female    DOB: [birth year], 54 y.o.   MRN: [id number]  HPI  Pt in with rt swollen and tender area on rt forearm. Pt just saw small scab on her rt forearm this weekend. Then this morning noted change to skin with swelling, margins and slight tender.   No fever, no chills or sweats.  Pt heading out of town this Friday. Going [place]  Pt speculates ant bite or spider. She has been working in her year.   Review of Systems     Objective:   Physical Exam  General- no acute distress, pleasant, alert and oriented. Rt forearm- 6 cm wide area pinkish red and slight raised, Rt upper ext- no palpable  lymph nodes.       Assessment & Plan:  You do appear to have skin infection which may be secondary to ant or spider bite.  Will prescribe bactrim ds antibiotic. Watch size of area and let me know if you think the gets larger.  Any creamy/yellow or serosanguinous  discharge let us know.  Follow up 7 days or as needed.  Time spent with patient today was 18  minutes which consisted of discussing diagnosis, treatment and documentation.  Mackie Pai, PA-C

## 2020-05-01 NOTE — Patient Instructions (Addendum)
You do appear to have skin infection which may be secondary to ant or spider bite.  Will prescribe bactrim ds antibiotic. Watch size of area and let me know if you think the gets larger.  Any creamy/yellow or serosanguinous  discharge let us know.  Follow up 7 days or as needed.  Any severe itching or allergic type presentation let me know. Use benadryl presently at night.

## 2020-05-02 ENCOUNTER — Ambulatory Visit: Payer: 59

## 2020-05-02 DIAGNOSIS — M25572 Pain in left ankle and joints of left foot: Secondary | ICD-10-CM

## 2020-05-02 DIAGNOSIS — R262 Difficulty in walking, not elsewhere classified: Secondary | ICD-10-CM

## 2020-05-02 DIAGNOSIS — M25672 Stiffness of left ankle, not elsewhere classified: Secondary | ICD-10-CM | POA: Diagnosis not present

## 2020-05-02 NOTE — Therapy (Signed)
[facility] [address]  [place], [place], [address] Phone: [phone]   Fax:  [phone]  Physical Therapy Treatment  Patient Details  Name: Hailey Noble MRN: [id number] Date of Birth: Oct 16, 1966 Referring Provider (PT): Clearance Coots, MD   Encounter Date: 05/02/2020  PT End of Session - 05/02/20 0852    Visit Number  6    Number of Visits  7    Date for PT Re-Evaluation  05/02/20    Authorization Type  [facility]    PT Start Time  0846    PT Stop Time  0930    PT Time Calculation (min)  44 min    Activity Tolerance  Patient tolerated treatment well    Behavior During Therapy  [facility] for tasks assessed/performed       Past Medical History:  Diagnosis Date  . Allergic state 12/08/2016  . Allergy   . Anxiety   . Back pain    " i just have a weak loer back"   . Fibroids   . GERD (gastroesophageal reflux disease)   . Hyperlipidemia 06/09/2017  . Insulin resistance   . Joint pain   . Knee pain, right 03/10/2017  . Plantar fasciitis   . PONV (postoperative nausea and vomiting)    just nausea   . Shortness of breath    not only if  i exert myselg   . Sinusitis   . Vitamin D deficiency 11/22/2016  . Vitamin D deficiency     Past Surgical History:  Procedure Laterality Date  . ABDOMINAL HYSTERECTOMY  09/2019   [place]  . COLONOSCOPY  02-2009   with Henrene Pastor normal  . CYSTOSCOPY N/A 10/12/2019   Procedure: CYSTOSCOPY;  Surgeon: Sherlyn Hay, DO;  Location: [facility];  Service: Gynecology;  Laterality: N/A;  . ESOPHAGOGASTRODUODENOSCOPY  2012  . GANGLION CYST EXCISION Right    right- wrist   . NASAL SEPTUM SURGERY    . NASAL SINUS SURGERY    . TMJ ARTHROSCOPY     left  . TOTAL LAPAROSCOPIC HYSTERECTOMY WITH BILATERAL SALPINGO OOPHORECTOMY Bilateral 10/12/2019   Procedure: TOTAL LAPAROSCOPIC HYSTERECTOMY WITH BILATERAL SALPINGO OOPHORECTOMY, Lysis Pelvic Adhseions ;  Surgeon: Sherlyn Hay, DO;  Location: [facility];  Service: Gynecology;  Laterality: Bilateral;    There were no vitals filed for this visit.  Subjective Assessment - 05/02/20 0853    Subjective  Pt. reporting some lateral ankle/shin soreness after performing updated HEP.    Pertinent History  SOB, plantar fasciitis, R knee pain, HLD, GERD, back pain, anxiety, L TMJ arthroscopy    Diagnostic tests  03/01/20 L ankle [name]: Findings would suggest peroneal tendinopathy    Patient Stated Goals  "work on strength"    Currently in Pain?  No/denies    Pain Score  0-No pain   up to a 5/10 up stairs   Pain Location  Foot    Pain Orientation  Left;Lateral    Pain Descriptors / Indicators  --   "pulling "   Pain Type  Chronic pain    Multiple Pain Sites  No                       [facility] Adult PT Treatment/Exercise - 05/02/20 0001      Self-Care   Self-Care  Other Self-Care Comments    Other Self-Care Comments   instruction on self progression of  SLS on pillow and whether to stay with 3# sidelying IV, EV as she felt this had provided her with soreness       Knee/Hip Exercises: Standing   SLS  L SLS 2 x 30 sec; on firm surface and foam each rep    SLS with Vectors  L SLS on pillow and foam 30 sec sec    Moderate L ankle instability noted on foam      Ankle Exercises: Aerobic   Nustep  L5 x 6 min (LEs only)      Ankle Exercises: Sidelying   Ankle Inversion  Strengthening;Left;Weights;15 reps    Ankle Inversion Weights (lbs)  2   reduced resistance as pt. noting soreness with 3#   Ankle Eversion  Strengthening;Left;Weights;15 reps    Ankle Eversion Weights (lbs)  2   reduced resistance as pt. noting soreness with 3#     Ankle Exercises: Stretches   Soleus Stretch  30 seconds;2 reps    Soleus Stretch Limitations  at counter top    Gastroc Stretch  30 seconds;3 reps    Gastroc Stretch Limitations  toes on wall; each LE    Other Stretch  L peroneals stretch in  runners stretch on wall 2 x 30 sec                PT Short Term Goals - 04/17/20 1755      PT SHORT TERM GOAL #1   Title  Patient to be independent with initial HEP.    Time  3    Period  Weeks    Status  Achieved    Target Date  04/11/20        PT Long Term Goals - 05/02/20 0923      PT LONG TERM GOAL #1   Title  Patient to be independent with advanced HEP.    Time  6    Period  Weeks    Status  On-going      PT LONG TERM GOAL #2   Title  Patient to demonstrate pain-free L ankle strength testing with >/=4+/5 strength.    Time  6    Period  Weeks    Status  On-going      PT LONG TERM GOAL #3   Title  Patient to demonstrate L ankle AROM WNL and pain-free.    Time  6    Period  Weeks    Status  On-going      PT LONG TERM GOAL #4   Title  Patient to demonstrate L SLS on foam for 30 sec with mild-moderate sway.    Time  6    Period  Weeks    Status  Partially Met   05/02/20:  mod / severe sway for 30 sec L SLS on foam     PT LONG TERM GOAL #5   Title  Patient to report 70% improvement in pain/discomfort when walking on graded treadmill for 1 hour.    Time  6    Period  Weeks    Status  On-going            Plan - 05/02/20 4401    Clinical Impression Statement  Pt. walking 5x/week for 30 min at 1% incline on TM.  Notes she has felt well with this activity.  Pt. noting ~ 85% improvement in ankle since start of therapy and wearing shoe lift.  Able to demo progress toward LTG #4 partially meeting goal of 30 sec  L SLS on foam - pt. demonstrating mod-max L ankle instability however pain free.  Pt. going to beach over this upcoming week and wondering if she is ok to walk on sloped beach.  Encouraged pt. to walk with tennis shoes if on sloped surface on beach for ankle/foot support as to not over-exert healing tissue.  Pt. verbalizing understanding.    Comorbidities  SOB, plantar fasciitis, R knee pain, HLD, GERD, back pain, anxiety, L TMJ arthroscopy    Rehab  Potential  Good    PT Frequency  1x / week    PT Treatment/Interventions  ADLs/Self Care Home Management;Cryotherapy;Electrical Stimulation;Iontophoresis 6m/ml Dexamethasone;Moist Heat;Balance training;Therapeutic exercise;Therapeutic activities;Functional mobility training;Stair training;Gait training;Ultrasound;Neuromuscular re-education;Patient/family education;Wheelchair mobility training;Manual techniques;Vestibular;Vasopneumatic Device;Taping;Energy conservation;Dry needling;Passive range of motion    PT Next Visit Plan  progress ankle stability and strengthening    Consulted and Agree with Plan of Care  Patient       Patient will benefit from skilled therapeutic intervention in order to improve the following deficits and impairments:  Decreased activity tolerance, Pain, Increased fascial restricitons, Decreased balance, Difficulty walking, Decreased range of motion, Improper body mechanics, Postural dysfunction, Impaired flexibility  Visit Diagnosis: Pain in left ankle and joints of left foot  Stiffness of left ankle, not elsewhere classified  Difficulty in walking, not elsewhere classified     Problem List Patient Active Problem List   Diagnosis Date Noted  . Peroneal tendinitis of left lower leg 02/16/2020  . Leukocytosis 12/02/2019  . Hyperglycemia 12/02/2019  . Pain of left heel 12/02/2019  . Pelvic pain 10/12/2019  . Post-operative state 10/12/2019  . Pain, abdominal, RLQ 11/28/2017  . Insulin resistance 10/21/2017  . Hyperlipidemia 06/09/2017  . Knee pain, right 03/10/2017  . Allergic state 12/08/2016  . Vitamin D deficiency 11/22/2016  . Depression with anxiety 04/06/2014  . Obesity (BMI 30-39.9) 04/06/2014  . Preventative health care 06/17/2011  . INGUINAL PAIN, LEFT 12/05/2008    MBess Harvest PTA 05/02/20 12:12 PM   [facility] [place] [address]Phone: [phone]   Fax:  [phone] Name: Hailey NIEMEIERMRN: [id number] of Birth: 5April 25, 1967

## 2020-05-03 ENCOUNTER — Encounter: Payer: 59 | Admitting: Physical Therapy

## 2020-05-08 ENCOUNTER — Ambulatory Visit (INDEPENDENT_AMBULATORY_CARE_PROVIDER_SITE_OTHER): Payer: 59 | Admitting: Physician Assistant

## 2020-05-11 ENCOUNTER — Ambulatory Visit: Payer: 59 | Admitting: Physical Therapy

## 2020-05-15 ENCOUNTER — Ambulatory Visit (INDEPENDENT_AMBULATORY_CARE_PROVIDER_SITE_OTHER): Payer: 59 | Admitting: Family Medicine

## 2020-05-15 DIAGNOSIS — N911 Secondary amenorrhea: Secondary | ICD-10-CM | POA: Diagnosis not present

## 2020-05-29 ENCOUNTER — Ambulatory Visit (INDEPENDENT_AMBULATORY_CARE_PROVIDER_SITE_OTHER): Payer: 59 | Admitting: Physician Assistant

## 2020-05-31 ENCOUNTER — Encounter (INDEPENDENT_AMBULATORY_CARE_PROVIDER_SITE_OTHER): Payer: Self-pay | Admitting: Physician Assistant

## 2020-05-31 ENCOUNTER — Other Ambulatory Visit: Payer: Self-pay

## 2020-05-31 ENCOUNTER — Ambulatory Visit (INDEPENDENT_AMBULATORY_CARE_PROVIDER_SITE_OTHER): Payer: 59 | Admitting: Physician Assistant

## 2020-05-31 VITALS — BP 133/76 | HR 78 | Temp 98.6°F | Ht 65.0 in | Wt 212.0 lb

## 2020-05-31 DIAGNOSIS — R5383 Other fatigue: Secondary | ICD-10-CM

## 2020-05-31 DIAGNOSIS — Z6835 Body mass index (BMI) 35.0-35.9, adult: Secondary | ICD-10-CM | POA: Diagnosis not present

## 2020-05-31 DIAGNOSIS — E559 Vitamin D deficiency, unspecified: Secondary | ICD-10-CM | POA: Diagnosis not present

## 2020-05-31 DIAGNOSIS — Z9189 Other specified personal risk factors, not elsewhere classified: Secondary | ICD-10-CM | POA: Diagnosis not present

## 2020-05-31 DIAGNOSIS — F3289 Other specified depressive episodes: Secondary | ICD-10-CM

## 2020-05-31 MED ORDER — BUPROPION HCL ER (SR) 150 MG PO TB12: 150.0000 mg | ORAL_TABLET | Freq: Every day | ORAL | 0 refills | Status: DC

## 2020-05-31 NOTE — Progress Notes (Signed)
Chief Complaint:   [place] is here to discuss her progress with her obesity treatment plan along with follow-up of her obesity related diagnoses. Satori is on the Category 2 Plan and states she is following her eating plan approximately 85% of the time. Pasha states she is exercising 0 minutes 0 times per week.  Today's visit was #: 61 Starting weight: 212 lbs Starting date: 10/06/2017 Today's weight: 212 lbs Today's date: 05/31/2020 Total lbs lost to date: 0 Total lbs lost since last in-office visit: 0  Interim History: Shila reports that she was at the beach on vacation and has had trouble staying focused. She is feeling very tired since her hysterectomy.  Subjective:   Other depression, with emotional eating. Carollyn is struggling with emotional eating and using food for comfort to the extent that it is negatively impacting her health. She has been working on behavior modification techniques to help reduce her emotional eating and has been somewhat successful. She shows no sign of suicidal or homicidal ideations. Jezelle endorses cravings.  Vitamin D deficiency. Last Vitamin D was not at goal - 40.2 on 04/10/2020. Yliana is on Vitamin D supplementation.  Other fatigue. Ainsley endorses excessive fatigue. No morning headache. She notes waking up snoring sometimes.  At risk for osteoporosis. Shiree is at higher risk of osteopenia and osteoporosis due to Vitamin D deficiency.   Assessment/Plan:   Other depression, with emotional eating. Behavior modification techniques were discussed today to help Tais deal with her emotional/non-hunger eating behaviors.  Orders and follow up as documented in patient record. Sativa will start buPROPion (WELLBUTRIN SR) 150 MG 12 hr tablet #30 with 0 refills.  Vitamin D deficiency. Low Vitamin D level contributes to fatigue and are associated with obesity, breast, and colon cancer. She agrees to continue to take Vitamin D and will  follow-up for routine testing of Vitamin D, at least 2-3 times per year to avoid over-replacement.  Other fatigue. Fatigue may be related to obesity, depression or many other causes. Labs will be ordered, and in the meanwhile, Prerna will focus on self care including making healthy food choices, increasing physical activity and focusing on stress reduction. Ambulatory referral to Sleep Studies for possible OSA.  At risk for osteoporosis. Shirely was given approximately 15 minutes of osteoporosis prevention counseling today. Janaiya is at risk for osteopenia and osteoporosis due to her Vitamin D deficiency. She was encouraged to take her Vitamin D and follow her higher calcium diet and increase strengthening exercise to help strengthen her bones and decrease her risk of osteopenia and osteoporosis.  Repetitive spaced learning was employed today to elicit superior memory formation and behavioral change.  Class 2 severe obesity with serious comorbidity and body mass index (BMI) of 35.0 to 35.9 in adult, unspecified obesity type ([place]).  Catrinia is currently in the action stage of change. As such, her goal is to continue with weight loss efforts. She has agreed to the Category 2 Plan + 200 protein calories.   Exercise goals: For substantial health benefits, adults should do at least 150 minutes (2 hours and 30 minutes) a week of moderate-intensity, or 75 minutes (1 hour and 15 minutes) a week of vigorous-intensity aerobic physical activity, or an equivalent combination of moderate- and vigorous-intensity aerobic activity. Aerobic activity should be performed in episodes of at least 10 minutes, and preferably, it should be spread throughout the week.  Behavioral modification strategies: meal planning and cooking strategies and keeping healthy foods  in the home.  Cailee has agreed to follow-up with our clinic in 2 weeks. She was informed of the importance of frequent follow-up visits to maximize her success  with intensive lifestyle modifications for her multiple health conditions.   Objective:   Blood pressure 133/76, pulse 78, temperature 98.6 F (37 C), temperature source Oral, height 5\' 5"  (1.651 m), weight 212 lb (96.2 kg), last menstrual period 11/22/2018, SpO2 95 %. Body mass index is 35.28 kg/m.  General: Cooperative, alert, well developed, in no acute distress. HEENT: Conjunctivae and lids unremarkable. Cardiovascular: Regular rhythm.  Lungs: Normal work of breathing. Neurologic: No focal deficits.   Lab Results  Component Value Date   CREATININE 0.59 04/10/2020   BUN 19 04/10/2020   NA 140 04/10/2020   K 4.5 04/10/2020   CL 105 04/10/2020   CO2 23 04/10/2020   Lab Results  Component Value Date   ALT 19 04/10/2020   AST 19 04/10/2020   ALKPHOS 157 (H) 04/10/2020   BILITOT 0.2 04/10/2020   Lab Results  Component Value Date   HGBA1C 5.4 12/02/2019   HGBA1C 5.3 10/08/2019   HGBA1C 5.4 06/16/2019   HGBA1C 5.4 12/30/2018   HGBA1C 5.4 09/21/2018   Lab Results  Component Value Date   INSULIN 10.1 04/10/2020   INSULIN 9.3 12/30/2018   INSULIN 11.1 09/21/2018   INSULIN 8.7 04/30/2018   INSULIN 8.8 10/06/2017   Lab Results  Component Value Date   TSH 1.37 12/02/2019   Lab Results  Component Value Date   CHOL 161 12/02/2019   HDL 44.60 12/02/2019   LDLCALC 92 12/02/2019   TRIG 122.0 12/02/2019   CHOLHDL 4 12/02/2019   Lab Results  Component Value Date   WBC 7.1 12/02/2019   HGB 13.6 12/02/2019   HCT 40.9 12/02/2019   MCV 89.8 12/02/2019   PLT 333.0 12/02/2019   Lab Results  Component Value Date   FERRITIN 50 11/09/2008   Attestation Statements:   Reviewed by clinician on day of visit: allergies, medications, problem list, medical history, surgical history, family history, social history, and previous encounter notes.  IMichaelene Song, am acting as transcriptionist for Abby Potash, PA-C   I have reviewed the above documentation for accuracy and  completeness, and I agree with the above. Abby Potash, PA-C

## 2020-06-01 ENCOUNTER — Ambulatory Visit: Payer: 59 | Admitting: Family Medicine

## 2020-06-01 VITALS — BP 128/70 | HR 84 | Temp 96.8°F | Resp 12 | Ht 65.0 in | Wt 218.0 lb

## 2020-06-01 DIAGNOSIS — E669 Obesity, unspecified: Secondary | ICD-10-CM | POA: Diagnosis not present

## 2020-06-01 DIAGNOSIS — M79602 Pain in left arm: Secondary | ICD-10-CM

## 2020-06-01 DIAGNOSIS — M25562 Pain in left knee: Secondary | ICD-10-CM

## 2020-06-01 DIAGNOSIS — E559 Vitamin D deficiency, unspecified: Secondary | ICD-10-CM

## 2020-06-01 DIAGNOSIS — G8929 Other chronic pain: Secondary | ICD-10-CM | POA: Diagnosis not present

## 2020-06-01 DIAGNOSIS — E7849 Other hyperlipidemia: Secondary | ICD-10-CM | POA: Diagnosis not present

## 2020-06-01 DIAGNOSIS — D72829 Elevated white blood cell count, unspecified: Secondary | ICD-10-CM

## 2020-06-01 DIAGNOSIS — M23252 Derangement of posterior horn of lateral meniscus due to old tear or injury, left knee: Secondary | ICD-10-CM | POA: Insufficient documentation

## 2020-06-01 DIAGNOSIS — M545 Low back pain, unspecified: Secondary | ICD-10-CM

## 2020-06-01 DIAGNOSIS — R739 Hyperglycemia, unspecified: Secondary | ICD-10-CM | POA: Diagnosis not present

## 2020-06-01 LAB — COMPREHENSIVE METABOLIC PANEL
ALT: 26 U/L (ref 0–35)
AST: 20 U/L (ref 0–37)
Albumin: 4.5 g/dL (ref 3.5–5.2)
Alkaline Phosphatase: 138 U/L — ABNORMAL HIGH (ref 39–117)
BUN: 23 mg/dL (ref 6–23)
CO2: 29 mEq/L (ref 19–32)
Calcium: 9.4 mg/dL (ref 8.4–10.5)
Chloride: 104 mEq/L (ref 96–112)
Creatinine, Ser: 0.55 mg/dL (ref 0.40–1.20)
GFR: 114.72 mL/min (ref >60.00)
Glucose, Bld: 94 mg/dL (ref 70–99)
Potassium: 4.4 mEq/L (ref 3.5–5.1)
Sodium: 137 mEq/L (ref 135–145)
Total Bilirubin: 0.4 mg/dL (ref 0.2–1.2)
Total Protein: 7 g/dL (ref 6.0–8.3)

## 2020-06-01 LAB — CBC WITH DIFFERENTIAL/PLATELET
Basophils Absolute: 0.1 10*3/uL (ref 0.0–0.1)
Basophils Relative: 1 % (ref 0.0–3.0)
Eosinophils Absolute: 0.1 10*3/uL (ref 0.0–0.7)
Eosinophils Relative: 1.4 % (ref 0.0–5.0)
HCT: 41.7 % (ref 36.0–46.0)
Hemoglobin: 14 g/dL (ref 12.0–15.0)
Lymphocytes Relative: 25.7 % (ref 12.0–46.0)
Lymphs Abs: 2 10*3/uL (ref 0.7–4.0)
MCHC: 33.5 g/dL (ref 30.0–36.0)
MCV: 89 fl (ref 78.0–100.0)
Monocytes Absolute: 0.5 10*3/uL (ref 0.1–1.0)
Monocytes Relative: 7 % (ref 3.0–12.0)
Neutro Abs: 5 10*3/uL (ref 1.4–7.7)
Neutrophils Relative %: 64.9 % (ref 43.0–77.0)
Platelets: 349 10*3/uL (ref 150.0–400.0)
RBC: 4.69 Mil/uL (ref 3.87–5.11)
RDW: 13 % (ref 11.5–15.5)
WBC: 7.7 10*3/uL (ref 4.0–10.5)

## 2020-06-01 LAB — LIPID PANEL
Cholesterol: 180 mg/dL (ref 0–200)
HDL: 47.8 mg/dL (ref >39.00)
LDL Cholesterol: 95 mg/dL (ref 0–99)
NonHDL: 132.47
Total CHOL/HDL Ratio: 4
Triglycerides: 185 mg/dL — ABNORMAL HIGH (ref 0.0–149.0)
VLDL: 37 mg/dL (ref 0.0–40.0)

## 2020-06-01 LAB — VITAMIN D 25 HYDROXY (VIT D DEFICIENCY, FRACTURES): VITD: 33.67 ng/mL (ref 30.00–100.00)

## 2020-06-01 LAB — MAGNESIUM: Magnesium: 2.2 mg/dL (ref 1.5–2.5)

## 2020-06-01 LAB — T4, FREE: Free T4: 0.71 ng/dL (ref 0.60–1.60)

## 2020-06-01 LAB — TSH: TSH: 1.47 u[IU]/mL (ref 0.35–4.50)

## 2020-06-01 MED ORDER — MOMETASONE FUROATE 50 MCG/ACT NA SUSP
2.0000 | Freq: Every day | NASAL | 3 refills | Status: DC
  Filled 2021-04-13: qty 51, 90d supply, fill #0

## 2020-06-01 NOTE — Assessment & Plan Note (Signed)
Supplement and monitor 

## 2020-06-01 NOTE — Patient Instructions (Signed)
Chronic Knee Pain, Adult Chronic knee pain is pain in one or both knees that lasts longer than 3 months. Symptoms of chronic knee pain may include swelling, stiffness, and discomfort. Age-related wear and tear (osteoarthritis) of the knee joint is the most common cause of chronic knee pain. Other possible causes include:  A long-term immune-related disease that causes inflammation of the knee (rheumatoid arthritis). This usually affects both knees.  Inflammatory arthritis, such as gout or pseudogout.  An injury to the knee that causes arthritis.  An injury to the knee that damages the ligaments. Ligaments are strong tissues that connect bones to each other.  Runner's knee or pain behind the kneecap. Treatment for chronic knee pain depends on the cause. The main treatments for chronic knee pain are physical therapy and weight loss. This condition may also be treated with medicines, injections, a knee sleeve or brace, and by using crutches. Rest, ice, compression (pressure), and elevation (RICE) therapy may also be recommended. Follow these instructions at home: If you have a knee sleeve or brace:   Wear it as told by your health care provider. Remove it only as told by your health care provider.  Loosen it if your toes tingle, become numb, or turn cold and blue.  Keep it clean.  If the sleeve or brace is not waterproof: ? Do not let it get wet. ? Remove it if allowed by your health care provider, or cover it with a watertight covering when you take a bath or a shower. Managing pain, stiffness, and swelling      If directed, apply heat to the affected area as often as told by your health care provider. Use the heat source that your health care provider recommends, such as a moist heat pack or a heating pad. ? If you have a removable sleeve or brace, remove it as told by your health care provider. ? Place a towel between your skin and the heat source. ? Leave the heat on for 20-30  minutes. ? Remove the heat if your skin turns bright red. This is especially important if you are unable to feel pain, heat, or cold. You may have a greater risk of getting burned.  If directed, put ice on the affected area. ? If you have a removable sleeve or brace, remove it as told by your health care provider. ? Put ice in a plastic bag. ? Place a towel between your skin and the bag. ? Leave the ice on for 20 minutes, 2-3 times a day.  Move your toes often to reduce stiffness and swelling.  Raise (elevate) the injured area above the level of your heart while you are sitting or lying down. Activity  Avoid activities where both feet leave the ground at the same time (high-impact activities). Examples are running, jumping rope, and doing jumping jacks.  Return to your normal activities as told by your health care provider. Ask your health care provider what activities are safe for you.  Follow the exercise plan that your health care provider designed for you. Your health care provider may suggest that you: ? Avoid activities that make knee pain worse. This may require you to change your exercise routines, sport participation, or job duties. ? Wear shoes with cushioned soles. ? Avoid high-impact activities or sports that require running and sudden changes in direction. ? Do physical therapy as told by your health care provider. Physical therapy is planned to match your needs and abilities. It may include  exercises for strength, flexibility, stability, and endurance. ? Do exercises that increase balance and strength, such as tai chi and yoga.  Do not use the injured limb to support your body weight until your health care provider says that you can. Use crutches, a cane, or a walker, as told by your health care provider. General instructions  Take over-the-counter and prescription medicines only as told by your health care provider.  Lose weight if you are overweight. Losing even a little  weight can reduce knee pain. Ask your health care provider what your ideal weight is, and how to safely lose extra weight. A food expert (dietitian) may be able to help you plan your meals.  Do not use any products that contain nicotine or tobacco, such as cigarettes, e-cigarettes, and chewing tobacco. These can delay healing. If you need help quitting, ask your health care provider.  Keep all follow-up visits as told by your health care provider. This is important. Contact a health care provider if:  You have knee pain that is not getting better or gets worse.  You are unable to do your physical therapy exercises due to knee pain. Get help right away if:  Your knee swells and the swelling becomes worse.  You cannot move your knee.  You have severe knee pain. Summary  Knee pain that lasts more than 3 months is considered chronic knee pain.  The main treatments for chronic knee pain are physical therapy and weight loss. You may also need to take medicines, wear a knee sleeve or brace, use crutches, and apply ice or heat.  Losing even a little weight can reduce knee pain. Ask your health care provider what your ideal weight is, and how to safely lose extra weight. A food expert (dietitian) may be able to help you plan your meals.  Work with a physical therapist to make a safe exercise program, as told by your health care provider. This information is not intended to replace advice given to you by your health care provider. Make sure you discuss any questions you have with your health care provider. Document Revised: 02/18/2019 Document Reviewed: 02/18/2019 Elsevier Patient Education  [employer].

## 2020-06-01 NOTE — Assessment & Plan Note (Signed)
Check labs 

## 2020-06-01 NOTE — Assessment & Plan Note (Signed)
Referred to sports medicine and use ice and lidocaine

## 2020-06-01 NOTE — Assessment & Plan Note (Signed)
hgba1c acceptable, minimize simple carbs. Increase exercise as tolerated.  

## 2020-06-01 NOTE — Assessment & Plan Note (Signed)
Encouraged heart healthy diet, increase exercise, avoid trans fats, consider a krill oil cap daily 

## 2020-06-02 LAB — IRON,TIBC AND FERRITIN PANEL
%SAT: 26 % (ref 16–45)
Ferritin: 36 ng/mL (ref 16–232)
Iron: 94 ug/dL (ref 45–160)
TIBC: 363 ug/dL (ref 250–450)

## 2020-06-05 NOTE — Assessment & Plan Note (Signed)
Encouraged DASH diet, decrease po intake and increase exercise as tolerated. Needs 7-8 hours of sleep nightly. Avoid trans fats, eat small, frequent meals every 4-5 hours with lean proteins, complex carbs and healthy fats. Minimize simple carbs 

## 2020-06-05 NOTE — Progress Notes (Signed)
Subjective:    Patient ID: Hebert Soho, female    DOB: [birth year], 54 y.o.   MRN: [id number]  Chief Complaint  Patient presents with   6 month follow up   Fatigue   Fall   Wrist Pain    left     HPI Patient is in today for follow up on chronic medical concerns. No recent febrile illness or hospitalizations. She is working hard and trying to take care of herself but it has been difficult. Is frustrated with her weight despite trying to eat well. Denies CP/palp/SOB/HA/congestion/fevers/GI or GU c/o. Taking meds as prescribed. She is struggling with increased left knee pain. No recent fall or trauma. She is struggling with increased fatigue this year.   Past Medical History:  Diagnosis Date   Allergic state 12/08/2016   Allergy    Anxiety    Back pain    " i just have a weak loer back"    Fibroids    GERD (gastroesophageal reflux disease)    Hyperlipidemia 06/09/2017   Insulin resistance    Joint pain    Knee pain, right 03/10/2017   Plantar fasciitis    PONV (postoperative nausea and vomiting)    just nausea    Shortness of breath    not only if  i exert myselg    Sinusitis    Vitamin D deficiency 11/22/2016   Vitamin D deficiency     Past Surgical History:  Procedure Laterality Date   ABDOMINAL HYSTERECTOMY  09/2019   [facility]   COLONOSCOPY  02-2009   with Henrene Pastor normal   CYSTOSCOPY N/A 10/12/2019   Procedure: CYSTOSCOPY;  Surgeon: Sherlyn Hay, DO;  Location: [facility];  Service: Gynecology;  Laterality: N/A;   ESOPHAGOGASTRODUODENOSCOPY  2012   GANGLION CYST EXCISION Right    right- wrist    NASAL SEPTUM SURGERY     NASAL SINUS SURGERY     TMJ ARTHROSCOPY     left   TOTAL LAPAROSCOPIC HYSTERECTOMY WITH BILATERAL SALPINGO OOPHORECTOMY Bilateral 10/12/2019   Procedure: TOTAL LAPAROSCOPIC HYSTERECTOMY WITH BILATERAL SALPINGO OOPHORECTOMY, Lysis Pelvic Adhseions ;  Surgeon: Sherlyn Hay, DO;  Location:  [facility];  Service: Gynecology;  Laterality: Bilateral;    Family History  Problem Relation Age of Onset   Hypertension Father    Diabetes Father    Hyperlipidemia Father    Breast cancer Mother 61   Cancer Mother        breast   Thyroid disease Mother    Sleep apnea Mother    Colon cancer Paternal Grandfather        died at age 23   Cancer Paternal Grandfather        GI CA   Obesity Sister    Diabetes Sister    Hypertension Sister    Heart disease Maternal Grandmother        MI   Cancer Maternal Grandfather        lung, smoker   Diabetes Maternal Grandfather    Diabetes Paternal Grandmother    Heart disease Paternal Grandmother    Pancreatitis Sister    Esophageal cancer Neg Hx    Stomach cancer Neg Hx    Stroke Neg Hx    Colon polyps Neg Hx    Rectal cancer Neg Hx    Pancreatic cancer Neg Hx     Social History   Socioeconomic History   Marital status: Single    Spouse name:  Not on file   Number of children: Not on file   Years of education: Not on file   Highest education level: Not on file  Occupational History   Occupation: Therapist, sports    Employer: [employer]  Tobacco Use   Smoking status: Never Smoker   Smokeless tobacco: Never Used  Vaping Use   Vaping Use: Never used  Substance and Sexual Activity   Alcohol use: Yes    Alcohol/week: 0.0 standard drinks    Comment: rare   Drug use: No   Sexual activity: Yes    Birth control/protection: I.U.D.  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 Works at [employer]. No dietary restrictions. Lives with dog   Social Determinants of Health   Financial Resource Strain:    Difficulty of Paying Living Expenses:   Food Insecurity:    Worried About Charity fundraiser in the Last Year:    Arboriculturist in the Last Year:   Transportation Needs:    Lexicographer (Medical):    Lack of Transportation (Non-Medical):   Physical Activity:    Days of Exercise per Week:    Minutes of Exercise per Session:   Stress:    Feeling of Stress :   Social Connections:    Frequency of Communication with Friends and Family:    Frequency of Social Gatherings with Friends and Family:    Attends Religious Services:    Active Member of Clubs or Organizations:    Attends Archivist Meetings:    Marital Status:   Intimate Partner Violence:    Fear of Current or Ex-Partner:    Emotionally Abused:    Physically Abused:    Sexually Abused:     Outpatient Medications Prior to Visit  Medication Sig Dispense Refill   acetaminophen (TYLENOL) 500 MG tablet Take 2 tablets (1,000 mg total) by mouth every 6 (six) hours as needed for moderate pain. 30 tablet 0   Artificial Tear Solution (SOOTHE XP) SOLN Place 1 drop into both eyes daily as needed (dry eyes).     buPROPion (WELLBUTRIN SR) 150 MG 12 hr tablet Take 1 tablet (150 mg total) by mouth daily. 30 tablet 0   Calcium Carbonate-Vitamin D (CALTRATE 600+D PO) Take 2 tablets by mouth daily.     Cholecalciferol (VITAMIN D) 50 MCG (2000 UT) tablet Take 4,000 Units by mouth daily.     escitalopram (LEXAPRO) 10 MG tablet Take 1 tablet (10 mg total) by mouth daily. 90 tablet 3   Krill Oil 500 MG CAPS Take 500 mg by mouth daily.     metFORMIN (GLUCOPHAGE) 500 MG tablet Take 1 tablet (500 mg total) by mouth daily with breakfast. 90 tablet 0   Multiple Vitamin (MULTIVITAMIN WITH MINERALS) TABS tablet Take 1 tablet by mouth daily.     vitamin C (ASCORBIC ACID) 500 MG tablet Take 500 mg by mouth daily.     mometasone (NASONEX) 50 MCG/ACT nasal spray Place 2 sprays into the nose daily. 17 g 12   No facility-administered medications prior to visit.    Allergies  Allergen Reactions   Coconut Fatty Acids Hives   Codeine    Mango Flavor Hives   [name] Itching and Swelling     Oxycodone-Acetaminophen     rash on face and head, itching   Tussionex Pennkinetic Er [Hydrocod Polst-Cpm Polst  Er]     rash on face and head, itching   Ultram [Tramadol] Other (See Comments)    Dizzy and low blood pressure.     Review of Systems  Constitutional: Positive for malaise/fatigue. Negative for fever.  HENT: Negative for congestion.   Eyes: Negative for blurred vision.  Respiratory: Negative for shortness of breath.   Cardiovascular: Negative for chest pain, palpitations and leg swelling.  Gastrointestinal: Negative for abdominal pain, blood in stool and nausea.  Genitourinary: Negative for dysuria and frequency.  Musculoskeletal: Positive for joint pain. Negative for falls.  Skin: Negative for rash.  Neurological: Negative for dizziness, loss of consciousness and headaches.  Endo/Heme/Allergies: Negative for environmental allergies.  Psychiatric/Behavioral: Negative for depression. The patient is not nervous/anxious.        Objective:    Physical Exam Vitals and nursing note reviewed.  Constitutional:      General: She is not in acute distress.    Appearance: She is well-developed.  HENT:     Head: Normocephalic and atraumatic.     Nose: Nose normal.  Eyes:     General:        Right eye: No discharge.        Left eye: No discharge.  Cardiovascular:     Rate and Rhythm: Normal rate and regular rhythm.     Heart sounds: No murmur heard.   Pulmonary:     Effort: Pulmonary effort is normal.     Breath sounds: Normal breath sounds.  Abdominal:     General: Bowel sounds are normal.     Palpations: Abdomen is soft.     Tenderness: There is no abdominal tenderness.  Musculoskeletal:     Cervical back: Normal range of motion and neck supple.  Skin:    General: Skin is warm and dry.  Neurological:     Mental Status: She is alert and oriented to person, place, and time.     BP 128/70 (BP Location: Right Arm, Cuff Size: Large)    Pulse 84    Temp (!)  96.8 F (36 C) (Temporal) Comment: patient sweaty   Resp 12    Ht 5\' 5"  (1.651 m)    Wt 218 lb (98.9 kg)    LMP 11/22/2018 (Approximate)    SpO2 96%    BMI 36.28 kg/m  Wt Readings from Last 3 Encounters:  06/01/20 218 lb (98.9 kg)  05/31/20 212 lb (96.2 kg)  05/01/20 214 lb 12.8 oz (97.4 kg)    Diabetic Foot Exam - Simple   No data filed     Lab Results  Component Value Date   WBC 7.7 06/01/2020   HGB 14.0 06/01/2020   HCT 41.7 06/01/2020   PLT 349.0 06/01/2020   GLUCOSE 94 06/01/2020   CHOL 180 06/01/2020   TRIG 185.0 (H) 06/01/2020   HDL 47.80 06/01/2020   LDLCALC 95 06/01/2020   ALT 26 06/01/2020   AST 20 06/01/2020   NA 137 06/01/2020   K 4.4 06/01/2020   CL 104 06/01/2020   CREATININE 0.55 06/01/2020   BUN 23 06/01/2020   CO2 29 06/01/2020   TSH 1.47 06/01/2020   HGBA1C 5.4 12/02/2019    Lab Results  Component Value Date   TSH 1.47 06/01/2020   Lab Results  Component Value Date   WBC 7.7 06/01/2020   HGB 14.0 06/01/2020   HCT 41.7 06/01/2020   MCV 89.0 06/01/2020   PLT 349.0 06/01/2020   Lab Results  Component Value  Date   NA 137 06/01/2020   K 4.4 06/01/2020   CO2 29 06/01/2020   GLUCOSE 94 06/01/2020   BUN 23 06/01/2020   CREATININE 0.55 06/01/2020   BILITOT 0.4 06/01/2020   ALKPHOS 138 (H) 06/01/2020   AST 20 06/01/2020   ALT 26 06/01/2020   PROT 7.0 06/01/2020   ALBUMIN 4.5 06/01/2020   CALCIUM 9.4 06/01/2020   ANIONGAP 8 10/08/2019   GFR 114.72 06/01/2020   Lab Results  Component Value Date   CHOL 180 06/01/2020   Lab Results  Component Value Date   HDL 47.80 06/01/2020   Lab Results  Component Value Date   LDLCALC 95 06/01/2020   Lab Results  Component Value Date   TRIG 185.0 (H) 06/01/2020   Lab Results  Component Value Date   CHOLHDL 4 06/01/2020   Lab Results  Component Value Date   HGBA1C 5.4 12/02/2019       Assessment & Plan:   Problem List Items Addressed This Visit    Low back pain - Primary   Obesity  (BMI 30-39.9)    Encouraged DASH diet, decrease po intake and increase exercise as tolerated. Needs 7-8 hours of sleep nightly. Avoid trans fats, eat small, frequent meals every 4-5 hours with lean proteins, complex carbs and healthy fats. Minimize simple carbs      Vitamin D deficiency    Supplement and monitor      Relevant Orders   VITAMIN D 25 Hydroxy (Vit-D Deficiency, Fractures) (Completed)   Hyperlipidemia    Encouraged heart healthy diet, increase exercise, avoid trans fats, consider a krill oil cap daily      Relevant Orders   Lipid panel (Completed)   TSH (Completed)   T4, free (Completed)   Leukocytosis    Check labs       Relevant Orders   Iron, TIBC and Ferritin Panel (Completed)   CBC w/Diff (Completed)   Hyperglycemia    hgba1c acceptable, minimize simple carbs. Increase exercise as tolerated.       Relevant Orders   Comprehensive metabolic panel (Completed)   TSH (Completed)   T4, free (Completed)   Left knee pain    Referred to sports medicine and use ice and lidocaine      Relevant Orders   Magnesium (Completed)   Ambulatory referral to Sports Medicine   Left arm pain      I am having Alfrieda I. Malachi maintain her Calcium Carbonate-Vitamin D (CALTRATE 600+D PO), Krill Oil, Soothe XP, vitamin C, Vitamin D, multivitamin with minerals, acetaminophen, escitalopram, metFORMIN, buPROPion, and mometasone.  Meds ordered this encounter  Medications   mometasone (NASONEX) 50 MCG/ACT nasal spray    Sig: Place 2 sprays into the nose daily.    Dispense:  51 g    Refill:  3     Penni Homans, MD

## 2020-06-07 ENCOUNTER — Encounter: Payer: Self-pay | Admitting: Family Medicine

## 2020-06-07 ENCOUNTER — Ambulatory Visit: Payer: 59 | Admitting: Physical Therapy

## 2020-06-07 ENCOUNTER — Ambulatory Visit: Payer: Self-pay

## 2020-06-07 ENCOUNTER — Other Ambulatory Visit: Payer: Self-pay

## 2020-06-07 ENCOUNTER — Ambulatory Visit: Payer: 59 | Admitting: Family Medicine

## 2020-06-07 VITALS — BP 130/78 | HR 70 | Ht 65.0 in

## 2020-06-07 DIAGNOSIS — G8929 Other chronic pain: Secondary | ICD-10-CM | POA: Diagnosis not present

## 2020-06-07 DIAGNOSIS — M25562 Pain in left knee: Secondary | ICD-10-CM

## 2020-06-07 DIAGNOSIS — M533 Sacrococcygeal disorders, not elsewhere classified: Secondary | ICD-10-CM

## 2020-06-07 MED ORDER — PREDNISONE 5 MG PO TABS
ORAL_TABLET | ORAL | 0 refills | Status: DC
Start: 2020-06-07 — End: 2020-07-04

## 2020-06-07 MED FILL — predniSONE 5 MG TABS: 5 | 6 days supply | Qty: 21 | Fill #0

## 2020-06-07 NOTE — Progress Notes (Signed)
Hailey Noble - 54 y.o. female MRN [id number]  Date of birth: 1966-04-18  SUBJECTIVE:  Including CC & ROS.  Chief Complaint  Patient presents with  . Knee Pain    left    Hailey Noble is a 54 y.o. female that is presenting with left knee pain and left lower back pain.  Pain is been ongoing for a few weeks.  She noticed some lower back pain after getting massage.  It seems to be occurring in the upper gluteus region and she does notice some radicular pain intermittently.  No injury or inciting event.  Left knee pain is occurring laterally.  Seems localized to this area.  It is worse when she gets up from a seated position.  No mechanical symptoms.  No effusion.   Review of Systems See HPI   HISTORY: Past Medical, Surgical, Social, and Family History Reviewed & Updated per EMR.   Pertinent Historical Findings include:  Past Medical History:  Diagnosis Date  . Allergic state 12/08/2016  . Allergy   . Anxiety   . Back pain    " i just have a weak loer back"   . Fibroids   . GERD (gastroesophageal reflux disease)   . Hyperlipidemia 06/09/2017  . Insulin resistance   . Joint pain   . Knee pain, right 03/10/2017  . Plantar fasciitis   . PONV (postoperative nausea and vomiting)    just nausea   . Shortness of breath    not only if  i exert myselg   . Sinusitis   . Vitamin D deficiency 11/22/2016  . Vitamin D deficiency     Past Surgical History:  Procedure Laterality Date  . ABDOMINAL HYSTERECTOMY  09/2019   [facility]  . COLONOSCOPY  02-2009   with Henrene Pastor normal  . CYSTOSCOPY N/A 10/12/2019   Procedure: CYSTOSCOPY;  Surgeon: Sherlyn Hay, DO;  Location: [facility];  Service: Gynecology;  Laterality: N/A;  . ESOPHAGOGASTRODUODENOSCOPY  2012  . GANGLION CYST EXCISION Right    right- wrist   . NASAL SEPTUM SURGERY    . NASAL SINUS SURGERY    . TMJ ARTHROSCOPY     left  . TOTAL LAPAROSCOPIC HYSTERECTOMY WITH BILATERAL SALPINGO OOPHORECTOMY Bilateral  10/12/2019   Procedure: TOTAL LAPAROSCOPIC HYSTERECTOMY WITH BILATERAL SALPINGO OOPHORECTOMY, Lysis Pelvic Adhseions ;  Surgeon: Sherlyn Hay, DO;  Location: [facility];  Service: Gynecology;  Laterality: Bilateral;    Family History  Problem Relation Age of Onset  . Hypertension Father   . Diabetes Father   . Hyperlipidemia Father   . Breast cancer Mother 29  . Cancer Mother        breast  . Thyroid disease Mother   . Sleep apnea Mother   . Colon cancer Paternal Grandfather        died at age 67  . Cancer Paternal Grandfather        GI CA  . Obesity Sister   . Diabetes Sister   . Hypertension Sister   . Heart disease Maternal Grandmother        MI  . Cancer Maternal Grandfather        lung, smoker  . Diabetes Maternal Grandfather   . Diabetes Paternal Grandmother   . Heart disease Paternal Grandmother   . Pancreatitis Sister   . Esophageal cancer Neg Hx   . Stomach cancer Neg Hx   . Stroke Neg Hx   . Colon polyps Neg  Hx   . Rectal cancer Neg Hx   . Pancreatic cancer Neg Hx     Social History   Socioeconomic History  . Marital status: Single    Spouse name: Not on file  . Number of children: Not on file  . Years of education: Not on file  . Highest education level: Not on file  Occupational History  . Occupation: Programmer, multimedia: [employer]  Tobacco Use  . Smoking status: Never Smoker  . Smokeless tobacco: Never Used  Vaping Use  . Vaping Use: Never used  Substance and Sexual Activity  . Alcohol use: Yes    Alcohol/week: 0.0 standard drinks    Comment: rare  . Drug use: No  . Sexual activity: Yes    Birth control/protection: I.U.D.  Other Topics Concern  . 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 Works at [employer]. No dietary restrictions. Lives with dog   Social Determinants of Health   Financial Resource Strain:   . Difficulty of Paying Living  Expenses:   Food Insecurity:   . Worried About Charity fundraiser in the Last Year:   . Arboriculturist in the Last Year:   Transportation Needs:   . Film/video editor (Medical):   Marland Kitchen Lack of Transportation (Non-Medical):   Physical Activity:   . Days of Exercise per Week:   . Minutes of Exercise per Session:   Stress:   . Feeling of Stress :   Social Connections:   . Frequency of Communication with Friends and Family:   . Frequency of Social Gatherings with Friends and Family:   . Attends Religious Services:   . Active Member of Clubs or Organizations:   . Attends Archivist Meetings:   Marland Kitchen Marital Status:   Intimate Partner Violence:   . Fear of Current or Ex-Partner:   . Emotionally Abused:   Marland Kitchen Physically Abused:   . Sexually Abused:      PHYSICAL EXAM:  VS: BP 130/78   Pulse 70   Ht 5\' 5"  (1.651 m)   LMP 11/22/2018 (Approximate)   BMI 36.28 kg/m  Physical Exam Gen: NAD, alert, cooperative with exam, well-appearing MSK:  Back: Tenderness to palpation over the origin of the gluteus. Tenderness palpation left SI joint. Normal internal and external rotation of the hip. Left knee: Tenderness palpation of the lateral joint space. No instability valgus or varus stress testing. No effusion. Normal strength resistance. Neurovascularly intact  Limited ultrasound: Left knee:  No effusion suprapatellar pouch. Normal-appearing quadricep and patellar tendon. Mild degenerative changes of meniscus in the medial joint space. Degenerative changes of the lateral meniscus the lateral joint space with well-maintained  Summary: Findings show minor degenerative changes of the lateral meniscus.  Ultrasound and interpretation by Clearance Coots, MD     ASSESSMENT & PLAN:   Chronic left SI joint pain Pain seems more related to the SI joint.  -Counseled on home exercise therapy and supportive care. -Can consider physical therapy or injection.  Left knee  pain Pain seems to be occurring over the joint space.  Possible for meniscal irritation but no significant structural changes observed on ultrasound. -Counseled on home exercise therapy and supportive care -Pennsaid samples. -Could consider imaging or injection or physical therapy

## 2020-06-07 NOTE — Patient Instructions (Signed)
Good to see you Please try the exercises  Please try ice as needed  Please try the rub on medicine   Please send me a message in MyChart with any questions or updates.  Please see me back in 4 weeks.   --Dr. Raeford Razor

## 2020-06-07 NOTE — Assessment & Plan Note (Signed)
Pain seems more related to the SI joint.  -Counseled on home exercise therapy and supportive care. -Can consider physical therapy or injection.

## 2020-06-07 NOTE — Progress Notes (Signed)
Medication Samples have been provided to the patient.  Drug name: Pennsaid       Strength: 2%        Qty: 2 Boxes  LOT: [id number]  Exp.Date: 01/2021  Dosing instructions: Use a pea size amount and rub gently.  The patient has been instructed regarding the correct time, dose, and frequency of taking this medication, including desired effects and most common side effects.   Sherrie George, [place] 1:09 PM 06/07/2020

## 2020-06-07 NOTE — Assessment & Plan Note (Signed)
Pain seems to be occurring over the joint space.  Possible for meniscal irritation but no significant structural changes observed on ultrasound. -Counseled on home exercise therapy and supportive care -Pennsaid samples. -Could consider imaging or injection or physical therapy

## 2020-06-09 ENCOUNTER — Encounter: Payer: Self-pay | Admitting: Physical Therapy

## 2020-06-09 ENCOUNTER — Other Ambulatory Visit: Payer: Self-pay

## 2020-06-09 ENCOUNTER — Ambulatory Visit: Payer: 59 | Attending: Family Medicine | Admitting: Physical Therapy

## 2020-06-09 DIAGNOSIS — R262 Difficulty in walking, not elsewhere classified: Secondary | ICD-10-CM | POA: Insufficient documentation

## 2020-06-09 DIAGNOSIS — M25672 Stiffness of left ankle, not elsewhere classified: Secondary | ICD-10-CM | POA: Insufficient documentation

## 2020-06-09 DIAGNOSIS — M25572 Pain in left ankle and joints of left foot: Secondary | ICD-10-CM | POA: Insufficient documentation

## 2020-06-09 NOTE — Therapy (Signed)
[facility] [address]  [address] [place], [place], [address] Phone: [phone]   Fax:  [phone]  Physical Therapy Discharge Summary  Patient Details  Name: Hailey Noble MRN: [id number] Date of Birth: July 29, 1966 Referring Provider (PT): Clare Gandy, MD   Encounter Date: 06/09/2020   PT End of Session - 06/09/20 0921    Visit Number 7    Number of Visits 7    Date for PT Re-Evaluation 05/02/20    Authorization Type [facility]    PT Start Time 0843    PT Stop Time 0917    PT Time Calculation (min) 34 min    Activity Tolerance Patient tolerated treatment well    Behavior During Therapy [facility] for tasks assessed/performed           Past Medical History:  Diagnosis Date  . Allergic state 12/08/2016  . Allergy   . Anxiety   . Back pain    " i just have a weak loer back"   . Fibroids   . GERD (gastroesophageal reflux disease)   . Hyperlipidemia 06/09/2017  . Insulin resistance   . Joint pain   . Knee pain, right 03/10/2017  . Plantar fasciitis   . PONV (postoperative nausea and vomiting)    just nausea   . Shortness of breath    not only if  i exert myselg   . Sinusitis   . Vitamin D deficiency 11/22/2016  . Vitamin D deficiency     Past Surgical History:  Procedure Laterality Date  . ABDOMINAL HYSTERECTOMY  09/2019   [place]  . COLONOSCOPY  02-2009   with Marina Goodell normal  . CYSTOSCOPY N/A 10/12/2019   Procedure: CYSTOSCOPY;  Surgeon: Edwinna Areola, DO;  Location: [facility];  Service: Gynecology;  Laterality: N/A;  . ESOPHAGOGASTRODUODENOSCOPY  2012  . GANGLION CYST EXCISION Right    right- wrist   . NASAL SEPTUM SURGERY    . NASAL SINUS SURGERY    . TMJ ARTHROSCOPY     left  . TOTAL LAPAROSCOPIC HYSTERECTOMY WITH BILATERAL SALPINGO OOPHORECTOMY Bilateral 10/12/2019   Procedure: TOTAL LAPAROSCOPIC HYSTERECTOMY WITH BILATERAL SALPINGO OOPHORECTOMY, Lysis Pelvic Adhseions ;  Surgeon:  Edwinna Areola, DO;  Location: [facility];  Service: Gynecology;  Laterality: Bilateral;    There were no vitals filed for this visit.   Subjective Assessment - 06/09/20 0844    Subjective L ankle has not given her any issues. However, feels that the sidelying exercise she has been doing may have been irritating the outside of her knee. Saw Dr. Jordan Likes for it who did an [name] and gave her some meds.    Pertinent History SOB, plantar fasciitis, R knee pain, HLD, GERD, back pain, anxiety, L TMJ arthroscopy    Diagnostic tests 03/01/20 L ankle [name]: Findings would suggest peroneal tendinopathy    Patient Stated Goals "work on strength"    Currently in Pain? Yes    Pain Score 3     Pain Location Knee    Pain Orientation Left    Pain Descriptors / Indicators Sharp   "nerve pain"   Pain Type Acute pain              [facility] PT Assessment - 06/09/20 0001      Assessment   Medical Diagnosis Peroneal Tendinitis of L lower leg    Referring Provider (PT) Clare Gandy, MD    Onset Date/Surgical Date 01/23/19  Observation/Other Assessments   Focus on Therapeutic Outcomes (FOTO)  Lower leg: 71 (29% limited, 15% predicted)      AROM   Left Ankle Dorsiflexion 15    Left Ankle Plantar Flexion 51    Left Ankle Inversion 37    Left Ankle Eversion 20      Strength   Left Ankle Dorsiflexion 5/5    Left Ankle Plantar Flexion 5/5   20 reps   Left Ankle Inversion 4+/5    Left Ankle Eversion 4+/5      Flexibility   Soft Tissue Assessment /Muscle Length yes    ITB positive L Ober's test                         [facility] Adult PT Treatment/Exercise - 06/09/20 0001      Ankle Exercises: Aerobic   Stationary Bike L2 x 6 min      Ankle Exercises: Standing   SLS L SLS on foam x24 sec without LOB   moderate ankle instability     Ankle Exercises: Stretches   Other Stretch L TFL stretch in supine with strap 2x30"                   PT Education -  06/09/20 0920    Education Details update and review of HEP; discussion on obbjective progress and remaining impairments    Person(s) Educated Patient    Methods Explanation;Demonstration;Tactile cues;Verbal cues;Handout    Comprehension Verbalized understanding;Returned demonstration            PT Short Term Goals - 06/09/20 0848      PT SHORT TERM GOAL #1   Title Patient to be independent with initial HEP.    Time 3    Period Weeks    Status Achieved    Target Date 04/11/20             PT Long Term Goals - 06/09/20 0848      PT LONG TERM GOAL #1   Title Patient to be independent with advanced HEP.    Time 6    Period Weeks    Status Achieved      PT LONG TERM GOAL #2   Title Patient to demonstrate pain-free L ankle strength testing with >/=4+/5 strength.    Time 6    Period Weeks    Status Achieved      PT LONG TERM GOAL #3   Title Patient to demonstrate L ankle AROM WNL and pain-free.    Time 6    Period Weeks    Status Achieved      PT LONG TERM GOAL #4   Title Patient to demonstrate L SLS on foam for 30 sec with mild-moderate sway.    Time 6    Period Weeks    Status Partially Met   06/09/20:  moderate sway but able to maintain balance for 24 sec     PT LONG TERM GOAL #5   Title Patient to report 70% improvement in pain/discomfort when walking on graded treadmill for 1 hour.    Time 6    Period Weeks    Status Deferred   able to complee 35-45 minutes without incline, has not attempted 1 hour on an incline d/t knee pain                Plan - 06/09/20 0921    Clinical Impression Statement Patient reports 95% improvement in L ankle  since initial eval. Now pain-free with ambulation and stair climbing, however, now noting new onset of L lateral knee pain. Patient has seen Sports Medicine for knee pain and has been put on Prednisone. Patient pointing to L ITB insertion as area of pain. Assessed TFL flexibility, with patient demonstrating positive  Ober's test. Worked on gentle TFL stretching which was tolerated and patient reporting good relief. Patient now with good L ankle strength and ROM. Still demonstrating moderate ankle instability with SLS on compliant surface. Reviewed and updated HJEP with exercises for max benefit. Patient reported understanding and without complaints at end of session. Patient is now ready for DC.    Comorbidities SOB, plantar fasciitis, R knee pain, HLD, GERD, back pain, anxiety, L TMJ arthroscopy    Rehab Potential Good    PT Frequency 1x / week    PT Treatment/Interventions ADLs/Self Care Home Management;Cryotherapy;Electrical Stimulation;Iontophoresis '4mg'$ /ml Dexamethasone;Moist Heat;Balance training;Therapeutic exercise;Therapeutic activities;Functional mobility training;Stair training;Gait training;Ultrasound;Neuromuscular re-education;Patient/family education;Wheelchair mobility training;Manual techniques;Vestibular;Vasopneumatic Device;Taping;Energy conservation;Dry needling;Passive range of motion    PT Next Visit Plan DC at this time    Consulted and Agree with Plan of Care Patient           Patient will benefit from skilled therapeutic intervention in order to improve the following deficits and impairments:  Decreased activity tolerance, Pain, Increased fascial restricitons, Decreased balance, Difficulty walking, Decreased range of motion, Improper body mechanics, Postural dysfunction, Impaired flexibility  Visit Diagnosis: Pain in left ankle and joints of left foot  Stiffness of left ankle, not elsewhere classified  Difficulty in walking, not elsewhere classified     Problem List Patient Active Problem List   Diagnosis Date Noted  . Left knee pain 06/01/2020  . Left arm pain 06/01/2020  . Peroneal tendinitis of left lower leg 02/16/2020  . Leukocytosis 12/02/2019  . Hyperglycemia 12/02/2019  . Pain of left heel 12/02/2019  . Pelvic pain 10/12/2019  . Post-operative state 10/12/2019  .  Pain, abdominal, RLQ 11/28/2017  . Insulin resistance 10/21/2017  . Hyperlipidemia 06/09/2017  . Knee pain, right 03/10/2017  . Allergic state 12/08/2016  . Vitamin D deficiency 11/22/2016  . Depression with anxiety 04/06/2014  . Obesity (BMI 30-39.9) 04/06/2014  . Preventative health care 06/17/2011  . 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 INGUINAL PAIN, LEFT 12/05/2008     PHYSICAL THERAPY DISCHARGE SUMMARY  Visits from Start of Care: 7  Current functional level related to goals / functional outcomes: See above clinical impression   Remaining deficits: Ankle instability with SLS on compliant surface   Education / Equipment: HEP  Plan: Patient agrees to discharge.  Patient goals were partially met. Patient is being discharged due to meeting the stated rehab goals.  ?????     Janene Harvey, PT, DPT 06/09/20 12:11 PM   [facility] [address]  [place], [place], [address] Phone: [phone]   Fax:  [phone]  Name: BEONKA AMESQUITA MRN: [id number] Date of Birth: 1966-10-10

## 2020-06-19 ENCOUNTER — Ambulatory Visit (INDEPENDENT_AMBULATORY_CARE_PROVIDER_SITE_OTHER): Payer: 59 | Admitting: Physician Assistant

## 2020-06-19 ENCOUNTER — Other Ambulatory Visit: Payer: Self-pay

## 2020-06-19 ENCOUNTER — Encounter (INDEPENDENT_AMBULATORY_CARE_PROVIDER_SITE_OTHER): Payer: Self-pay | Admitting: Physician Assistant

## 2020-06-19 VITALS — BP 130/76 | HR 73 | Temp 98.1°F | Ht 65.0 in | Wt 207.0 lb

## 2020-06-19 DIAGNOSIS — E559 Vitamin D deficiency, unspecified: Secondary | ICD-10-CM

## 2020-06-19 DIAGNOSIS — Z9189 Other specified personal risk factors, not elsewhere classified: Secondary | ICD-10-CM

## 2020-06-19 DIAGNOSIS — F3289 Other specified depressive episodes: Secondary | ICD-10-CM | POA: Diagnosis not present

## 2020-06-19 DIAGNOSIS — E669 Obesity, unspecified: Secondary | ICD-10-CM | POA: Diagnosis not present

## 2020-06-19 DIAGNOSIS — Z6834 Body mass index (BMI) 34.0-34.9, adult: Secondary | ICD-10-CM

## 2020-06-19 MED ORDER — VITAMIN D (ERGOCALCIFEROL) 1.25 MG (50000 UNIT) PO CAPS: 50000.0000 [IU] | ORAL_CAPSULE | ORAL | 0 refills | Status: DC

## 2020-06-19 MED ORDER — BUPROPION HCL ER (SR) 150 MG PO TB12: 150.0000 mg | ORAL_TABLET | Freq: Every day | ORAL | 0 refills | Status: DC

## 2020-06-19 MED FILL — VIT D2 1.25 MG (50,000 UNIT: 1.25 MG | 28 days supply | Qty: 4 | Fill #0

## 2020-06-19 NOTE — Progress Notes (Signed)
Chief Complaint:   Hailey Noble is here to discuss her progress with her obesity treatment plan along with follow-up of her obesity related diagnoses. Hailey Noble is on the Category 2 Plan + 200 calories and states she is following her eating plan approximately 90% of the time. Hailey Noble states she is walking 30 minutes 3 times per week.  Today's visit was #: 44 Starting weight: 212 lbs Starting date: 10/06/2017 Today's weight: 207 lbs Today's date: 06/19/2020 Total lbs lost to date: 5 Total lbs lost since last in-office visit: 5  Interim History: Hailey Noble reports that the bupropion has made a tremendous difference in her sweet cravings. She is using cottage cheese and yogurt for extra snack calories.  Subjective:   Other depression, with emotional eating. Hailey Noble is struggling with emotional eating and using food for comfort to the extent that it is negatively impacting her health. She has been working on behavior modification techniques to help reduce her emotional eating and has been somewhat successful. She shows no sign of suicidal or homicidal ideations. Hailey Noble is on Wellbutrin.  Vitamin D deficiency. Hailey Noble is on Vitamin D supplementation. No nausea, vomiting, or muscle weakness.    Ref. Range 04/10/2020 12:46  Vitamin D, 25-Hydroxy Latest Ref Range: 30.0 - 100.0 ng/mL 40.2   At risk for osteoporosis. Hailey Noble is at higher risk of osteopenia and osteoporosis due to Vitamin D deficiency.   Assessment/Plan:   Other depression, with emotional eating. Behavior modification techniques were discussed today to help Archita deal with her emotional/non-hunger eating behaviors.  Orders and follow up as documented in patient record. Refill was given for buPROPion (WELLBUTRIN SR) 150 MG 12 hr tablet #90 with 0 refills.  Vitamin D deficiency. Low Vitamin D level contributes to fatigue and are associated with obesity, breast, and colon cancer. She will restart Vitamin D, Ergocalciferol,  (DRISDOL) 1.25 MG (50000 UNIT) CAPS capsule every week #4 with 0 refills and will follow-up for routine testing of Vitamin D, at least 2-3 times per year to avoid over-replacement.   At risk for osteoporosis. Hailey Noble was given approximately 15 minutes of osteoporosis prevention counseling today. Hailey Noble is at risk for osteopenia and osteoporosis due to her Vitamin D deficiency. She was encouraged to take her Vitamin D and follow her higher calcium diet and increase strengthening exercise to help strengthen her bones and decrease her risk of osteopenia and osteoporosis.  Repetitive spaced learning was employed today to elicit superior memory formation and behavioral change.  Class 1 obesity with serious comorbidity and body mass index (BMI) of 34.0 to 34.9 in adult, unspecified obesity type.  Hailey Noble is currently in the action stage of change. As such, her goal is to continue with weight loss efforts. She has agreed to the Category 2 Plan + 200 calories daily.   Exercise goals: For substantial health benefits, adults should do at least 150 minutes (2 hours and 30 minutes) a week of moderate-intensity, or 75 minutes (1 hour and 15 minutes) a week of vigorous-intensity aerobic physical activity, or an equivalent combination of moderate- and vigorous-intensity aerobic activity. Aerobic activity should be performed in episodes of at least 10 minutes, and preferably, it should be spread throughout the week.  Behavioral modification strategies: meal planning and cooking strategies and planning for success.  Hailey Noble has agreed to follow-up with our clinic in 3 weeks. She was informed of the importance of frequent follow-up visits to maximize her success with intensive lifestyle modifications for her multiple health  conditions.   Objective:   Blood pressure 130/76, pulse 73, temperature 98.1 F (36.7 C), temperature source Oral, height 5\' 5"  (1.651 m), weight 207 lb (93.9 kg), last menstrual period 11/22/2018,  SpO2 95 %. Body mass index is 34.45 kg/m.  General: Cooperative, alert, well developed, in no acute distress. HEENT: Conjunctivae and lids unremarkable. Cardiovascular: Regular rhythm.  Lungs: Normal work of breathing. Neurologic: No focal deficits.   Lab Results  Component Value Date   CREATININE 0.55 06/01/2020   BUN 23 06/01/2020   NA 137 06/01/2020   K 4.4 06/01/2020   CL 104 06/01/2020   CO2 29 06/01/2020   Lab Results  Component Value Date   ALT 26 06/01/2020   AST 20 06/01/2020   ALKPHOS 138 (H) 06/01/2020   BILITOT 0.4 06/01/2020   Lab Results  Component Value Date   HGBA1C 5.4 12/02/2019   HGBA1C 5.3 10/08/2019   HGBA1C 5.4 06/16/2019   HGBA1C 5.4 12/30/2018   HGBA1C 5.4 09/21/2018   Lab Results  Component Value Date   INSULIN 10.1 04/10/2020   INSULIN 9.3 12/30/2018   INSULIN 11.1 09/21/2018   INSULIN 8.7 04/30/2018   INSULIN 8.8 10/06/2017   Lab Results  Component Value Date   TSH 1.47 06/01/2020   Lab Results  Component Value Date   CHOL 180 06/01/2020   HDL 47.80 06/01/2020   LDLCALC 95 06/01/2020   TRIG 185.0 (H) 06/01/2020   CHOLHDL 4 06/01/2020   Lab Results  Component Value Date   WBC 7.7 06/01/2020   HGB 14.0 06/01/2020   HCT 41.7 06/01/2020   MCV 89.0 06/01/2020   PLT 349.0 06/01/2020   Lab Results  Component Value Date   IRON 94 06/01/2020   TIBC 363 06/01/2020   FERRITIN 36 06/01/2020   Attestation Statements:   Reviewed by clinician on day of visit: allergies, medications, problem list, medical history, surgical history, family history, social history, and previous encounter notes.  IMichaelene Song, am acting as transcriptionist for Abby Potash, PA-C   I have reviewed the above documentation for accuracy and completeness, and I agree with the above. Abby Potash, PA-C

## 2020-06-28 MED FILL — BUPROPION HCL ER (SR) 150 M: 150 | 30 days supply | Qty: 30 | Fill #0

## 2020-07-04 ENCOUNTER — Ambulatory Visit (INDEPENDENT_AMBULATORY_CARE_PROVIDER_SITE_OTHER): Payer: 59 | Admitting: Neurology

## 2020-07-04 ENCOUNTER — Ambulatory Visit: Payer: 59 | Admitting: Family Medicine

## 2020-07-04 ENCOUNTER — Other Ambulatory Visit: Payer: Self-pay

## 2020-07-04 ENCOUNTER — Encounter: Payer: Self-pay | Admitting: Neurology

## 2020-07-04 ENCOUNTER — Encounter: Payer: Self-pay | Admitting: Family Medicine

## 2020-07-04 VITALS — BP 132/82 | HR 90 | Ht 65.0 in | Wt 214.3 lb

## 2020-07-04 DIAGNOSIS — M7672 Peroneal tendinitis, left leg: Secondary | ICD-10-CM

## 2020-07-04 DIAGNOSIS — Z820 Family history of epilepsy and other diseases of the nervous system: Secondary | ICD-10-CM | POA: Diagnosis not present

## 2020-07-04 DIAGNOSIS — E669 Obesity, unspecified: Secondary | ICD-10-CM | POA: Diagnosis not present

## 2020-07-04 DIAGNOSIS — G4719 Other hypersomnia: Secondary | ICD-10-CM | POA: Diagnosis not present

## 2020-07-04 DIAGNOSIS — G8929 Other chronic pain: Secondary | ICD-10-CM

## 2020-07-04 DIAGNOSIS — R519 Headache, unspecified: Secondary | ICD-10-CM | POA: Diagnosis not present

## 2020-07-04 DIAGNOSIS — M25562 Pain in left knee: Secondary | ICD-10-CM | POA: Diagnosis not present

## 2020-07-04 DIAGNOSIS — R351 Nocturia: Secondary | ICD-10-CM

## 2020-07-04 NOTE — Patient Instructions (Signed)

## 2020-07-04 NOTE — Progress Notes (Signed)
Subjective:    Patient ID: Hailey Noble is a 54 y.o. female.  HPI     Star Age, MD, PhD [facility] [address], [address] [place], [place] [address]  Dear Linus Orn,   I saw your patient, Hailey Noble, upon your kind request, in my sleep clinic today for initial consultation of her sleep disorder, in particular, concern for underlying obstructive sleep apnea.  The patient is unaccompanied today.  As you know, [name] is a 54 year old right-handed woman with an underlying medical history of reflux disease, hyperlipidemia, anxiety, allergies, vitamin D deficiency, insulin resistance and obesity, who reports excessive daytime somnolence and difficulty achieving weight loss.  I reviewed your office note from 05/31/2020.  Her Epworth sleepiness score is 19 out of 24, fatigue severity score is 33 out of 63.  In the past 6 months she has had increasing fatigue and daytime somnolence.  She has a family history of sleep apnea, both her mom and her sister have CPAP machines.  She had a total abdominal hysterectomy in October 2020.  She is single, lives alone, has 2 dogs in the household who tends to sleep on the bed with her, she has a large king-size bed.  She likes to sleep with a larger body pillow, typically sleeps on her right side.  She does wake up with mouth dryness and is a mouth breather.  She is not sure if she snores, has never been told.  She had deviated septum repair and turbinate reduction under Dr. Wilburn Cornelia in the past.  She has reduced her soda intake and now limits her caffeine to 1 cup of coffee per day.  She is a non-smoker and does not currently drink any alcohol.  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  Bedtime is generally between 930 and 10 and rise time around 5:30 AM.  She has had occasional morning headaches which tend to be on the back, in the neck area.   She believes that she has tension headaches.  She has nocturia on average twice per night.  Her Past Medical History Is Significant For: Past Medical History:  Diagnosis Date  . Allergic state 12/08/2016  . Allergy   . Anxiety   . Back pain    " i just have a weak loer back"   . Fibroids   . GERD (gastroesophageal reflux disease)   . Hyperlipidemia 06/09/2017  . Insulin resistance   . Joint pain   . Knee pain, right 03/10/2017  . Plantar fasciitis   . PONV (postoperative nausea and vomiting)    just nausea   . Shortness of breath    not only if  i exert myselg   . Sinusitis   . Vitamin D deficiency 11/22/2016  . Vitamin D deficiency     Her Past Surgical History Is Significant For: Past Surgical History:  Procedure Laterality Date  . ABDOMINAL HYSTERECTOMY  09/2019   [place]  . COLONOSCOPY  02-2009   with Henrene Pastor normal  . CYSTOSCOPY N/A 10/12/2019   Procedure: CYSTOSCOPY;  Surgeon: Sherlyn Hay, DO;  Location: [facility];  Service: Gynecology;  Laterality: N/A;  . ESOPHAGOGASTRODUODENOSCOPY  2012  . GANGLION CYST EXCISION Right    right- wrist   . NASAL SEPTUM SURGERY    . NASAL SINUS SURGERY    . TMJ ARTHROSCOPY  left  . TOTAL LAPAROSCOPIC HYSTERECTOMY WITH BILATERAL SALPINGO OOPHORECTOMY Bilateral 10/12/2019   Procedure: TOTAL LAPAROSCOPIC HYSTERECTOMY WITH BILATERAL SALPINGO OOPHORECTOMY, Lysis Pelvic Adhseions ;  Surgeon: Sherlyn Hay, DO;  Location: [place];  Service: Gynecology;  Laterality: Bilateral;    Her Family History Is Significant For: Family History  Problem Relation Age of Onset  . Hypertension Father   . Diabetes Father   . Hyperlipidemia Father   . Breast cancer Mother 12  . Cancer Mother        breast  . Thyroid disease Mother   . Sleep apnea Mother   . Colon cancer Paternal Grandfather        died at age 61  . Cancer Paternal Grandfather        GI CA  . Obesity Sister   . Diabetes  Sister   . Hypertension Sister   . Heart disease Maternal Grandmother        MI  . Cancer Maternal Grandfather        lung, smoker  . Diabetes Maternal Grandfather   . Diabetes Paternal Grandmother   . Heart disease Paternal Grandmother   . Pancreatitis Sister   . Esophageal cancer Neg Hx   . Stomach cancer Neg Hx   . Stroke Neg Hx   . Colon polyps Neg Hx   . Rectal cancer Neg Hx   . Pancreatic cancer Neg Hx     Her Social History Is Significant For: Social History   Socioeconomic History  . Marital status: Single    Spouse name: Not on file  . Number of children: Not on file  . Years of education: Not on file  . Highest education level: Not on file  Occupational History  . Occupation: Programmer, multimedia: [employer]  Tobacco Use  . Smoking status: Never Smoker  . Smokeless tobacco: Never Used  Vaping Use  . Vaping Use: Never used  Substance and Sexual Activity  . Alcohol use: Yes    Alcohol/week: 0.0 standard drinks    Comment: rare  . Drug use: No  . Sexual activity: Yes    Birth control/protection: I.U.D.  Other Topics Concern  . 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 Works at [employer]. No dietary restrictions. Lives with dog   Social Determinants of Health   Financial Resource Strain:   . Difficulty of Paying Living Expenses:   Food Insecurity:   . Worried About Charity fundraiser in the Last Year:   . Arboriculturist in the Last Year:   Transportation Needs:   . Film/video editor (Medical):   Marland Kitchen Lack of Transportation (Non-Medical):   Physical Activity:   . Days of Exercise per Week:   . Minutes of Exercise per Session:   Stress:   . Feeling of Stress :   Social Connections:   . Frequency of Communication with Friends and Family:   . Frequency of Social Gatherings with Friends and Family:   . Attends Religious Services:   . Active Member of Clubs or Organizations:    . Attends Archivist Meetings:   Marland Kitchen Marital Status:     Her Allergies Are:  Allergies  Allergen Reactions  . Coconut Fatty Acids Hives  . Codeine   . Mango Flavor Hives  . [name] Itching and Swelling  . Oxycodone-Acetaminophen     rash  on face and head, itching  . Tussionex Pennkinetic Er [Hydrocod Polst-Cpm Polst Er]     rash on face and head, itching  . Ultram [Tramadol] Other (See Comments)    Dizzy and low blood pressure.   :   Her Current Medications Are:  Outpatient Encounter Medications as of 07/04/2020  Medication Sig  . acetaminophen (TYLENOL) 500 MG tablet Take 2 tablets (1,000 mg total) by mouth every 6 (six) hours as needed for moderate pain.  . Artificial Tear Solution (SOOTHE XP) SOLN Place 1 drop into both eyes daily as needed (dry eyes).  Marland Kitchen buPROPion (WELLBUTRIN SR) 150 MG 12 hr tablet Take 1 tablet (150 mg total) by mouth daily.  . Calcium Carbonate-Vitamin D (CALTRATE 600+D PO) Take 2 tablets by mouth daily.  . Cholecalciferol (VITAMIN D) 50 MCG (2000 UT) tablet Take 4,000 Units by mouth daily.  Marland Kitchen escitalopram (LEXAPRO) 10 MG tablet Take 1 tablet (10 mg total) by mouth daily.  Javier Docker Oil 500 MG CAPS Take 500 mg by mouth daily.  . metFORMIN (GLUCOPHAGE) 500 MG tablet Take 1 tablet (500 mg total) by mouth daily with breakfast.  . mometasone (NASONEX) 50 MCG/ACT nasal spray Place 2 sprays into the nose daily.  . Multiple Vitamin (MULTIVITAMIN WITH MINERALS) TABS tablet Take 1 tablet by mouth daily.  . vitamin C (ASCORBIC ACID) 500 MG tablet Take 500 mg by mouth daily.  . [DISCONTINUED] predniSONE (DELTASONE) 5 MG tablet Take 6 pills for first day, 5 pills second day, 4 pills third day, 3 pills fourth day, 2 pills the fifth day, and 1 pill sixth day.  . [DISCONTINUED] Vitamin D, Ergocalciferol, (DRISDOL) 1.25 MG (50000 UNIT) CAPS capsule Take 1 capsule (50,000 Units total) by mouth every 7 (seven) days.   No facility-administered encounter medications  on file as of 07/04/2020.  :  Review of Systems:  Out of a complete 14 point review of systems, all are reviewed and negative with the exception of these symptoms as listed below:  Review of Systems  Neurological:       Here for sleep consult-- Pt reports no prior sleep study, reports she is unsure if she snores but does report having daytime sleepiness.  Epworth Sleepiness Scale 0= would never doze 1= slight chance of dozing 2= moderate chance of dozing 3= high chance of dozing  Sitting and reading:2 Watching TV:3 Sitting inactive in a public place (ex. Theater or meeting):3 As a passenger in a car for an hour without a break:3 Lying down to rest in the afternoon:3 Sitting and talking to someone:1 Sitting quietly after lunch (no alcohol):3 In a car, while stopped in traffic:1 Total:19    Objective:  Neurological Exam  Physical Exam Physical Examination:   Vitals:   07/04/20 0834  BP: 132/82  Pulse: 90  SpO2: 94%    General Examination: The patient is a very pleasant 54 y.o. female in no acute distress. She appears well-developed and well-nourished and well groomed.   HEENT: Normocephalic, atraumatic, pupils are equal, round and reactive to light, extraocular tracking is good without limitation to gaze excursion or nystagmus noted. Hearing is grossly intact. Face is symmetric with normal facial animation. Speech is clear with no dysarthria noted. There is no hypophonia. There is no lip, neck/head, jaw or voice tremor. Neck is supple with full range of passive and active motion. There are no carotid bruits on auscultation. Oropharynx exam reveals: mild mouth dryness, adequate dental hygiene and moderate airway crowding, due to  thicker soft palate and prominent appearing uvula, small airway entry, tonsils of 1-2+ bilaterally, Mallampati class II, wider tongue noted.  Tongue protrudes centrally and palate elevates symmetrically, neck circumference of 16-1/4 inches.  She has a  mild overbite.  Nasal inspection reveals no nasal crowding, no evidence of deviated septum.    Chest: Clear to auscultation without wheezing, rhonchi or crackles noted.  Heart: S1+S2+0, regular and normal without murmurs, rubs or gallops noted.   Abdomen: Soft, non-tender and non-distended with normal bowel sounds appreciated on auscultation.  Extremities: There is no pitting edema in the distal lower extremities bilaterally.   Skin: Warm and dry without trophic changes noted.   Musculoskeletal: exam reveals no obvious joint deformities, tenderness or joint swelling or erythema.   Neurologically:  Mental status: The patient is awake, alert and oriented in all 4 spheres. Her immediate and remote memory, attention, language skills and fund of knowledge are appropriate. There is no evidence of aphasia, agnosia, apraxia or anomia. Speech is clear with normal prosody and enunciation. Thought process is linear. Mood is normal and affect is normal.  Cranial nerves II - XII are as described above under HEENT exam.  Motor exam: Normal bulk, strength and tone is noted. There is no tremor, Romberg is negative. Fine motor skills and coordination: grossly intact.  Cerebellar testing: No dysmetria or intention tremor. There is no truncal or gait ataxia.  Sensory exam: intact to light touch in the upper and lower extremities.  Gait, station and balance: She stands easily. No veering to one side is noted. No leaning to one side is noted. Posture is age-appropriate and stance is narrow based. Gait shows normal stride length and normal pace. No problems turning are noted. Tandem walk is unremarkable.                Assessment and Plan:  In summary, Hailey Noble is a very pleasant 54 y.o.-year old female with an underlying medical history of reflux disease, hyperlipidemia, anxiety, allergies, vitamin D deficiency, insulin resistance and obesity, whose history and physical exam concerning for obstructive sleep  apnea (OSA). I had a long chat with the patient about my findings and the diagnosis of OSA, its prognosis and treatment options. We talked about medical treatments, surgical interventions and non-pharmacological approaches. I explained in particular the risks and ramifications of untreated moderate to severe OSA, especially with respect to developing cardiovascular disease down the Road, including congestive heart failure, difficult to treat hypertension, cardiac arrhythmias, or stroke. Even type 2 diabetes has, in part, been linked to untreated OSA. Symptoms of untreated OSA include daytime sleepiness, memory problems, mood irritability and mood disorder such as depression and anxiety, lack of energy, as well as recurrent headaches, especially morning headaches. We talked about trying to maintain a healthy lifestyle in general, as well as the importance of weight control. We also talked about the importance of good sleep hygiene. I recommended the following at this time: sleep study.  I explained the sleep test procedure to the patient and also outlined possible surgical and non-surgical treatment options of OSA, including the use of a custom-made dental device (which would require a referral to a specialist dentist or oral surgeon), upper airway surgical options, such as traditional UPPP or a novel less invasive surgical option in the form of Inspire hypoglossal nerve stimulation (which would involve a referral to an ENT surgeon). I also explained the CPAP treatment option to the patient, who indicated that she would be willing  to try CPAP if the need arises. I explained the importance of being compliant with PAP treatment, not only for insurance purposes but primarily to improve Her symptoms, and for the patient's long term health benefit, including to reduce Her cardiovascular risks. I answered all her questions today and the patient was in agreement. I plan to see her back after the sleep study is completed  and encouraged her to call with any interim questions, concerns, problems or updates.   Thank you very much for allowing me to participate in the care of this nice patient. If I can be of any further assistance to you please do not hesitate to call me at [phone].  Sincerely,   Star Age, MD, PhD

## 2020-07-04 NOTE — Assessment & Plan Note (Signed)
Has had significant improvement with NSAIDs.  Denies any pain today. -Counseled on supportive care. -Can provide with Pennsaid samples when available.

## 2020-07-04 NOTE — Progress Notes (Signed)
Hailey Noble - 54 y.o. female MRN [id number]  Date of birth: 01/05/1966  SUBJECTIVE:  Including CC & ROS.  Chief Complaint  Patient presents with  . Follow-up    left knee    Hailey Noble is a 54 y.o. female that is following up for her left knee and left lower tendinitis.  She has been active in her yard and doing landscaping.  Denies any trouble with lifting or moving.  Has been using her standing desk.   Review of Systems See HPI   HISTORY: Past Medical, Surgical, Social, and Family History Reviewed & Updated per EMR.   Pertinent Historical Findings include:  Past Medical History:  Diagnosis Date  . Allergic state 12/08/2016  . Allergy   . Anxiety   . Back pain    " i just have a weak loer back"   . Fibroids   . GERD (gastroesophageal reflux disease)   . Hyperlipidemia 06/09/2017  . Insulin resistance   . Joint pain   . Knee pain, right 03/10/2017  . Plantar fasciitis   . PONV (postoperative nausea and vomiting)    just nausea   . Shortness of breath    not only if  i exert myselg   . Sinusitis   . Vitamin D deficiency 11/22/2016  . Vitamin D deficiency     Past Surgical History:  Procedure Laterality Date  . ABDOMINAL HYSTERECTOMY  09/2019   [place]  . COLONOSCOPY  02-2009   with Henrene Pastor normal  . CYSTOSCOPY N/A 10/12/2019   Procedure: CYSTOSCOPY;  Surgeon: Sherlyn Hay, DO;  Location: [facility];  Service: Gynecology;  Laterality: N/A;  . ESOPHAGOGASTRODUODENOSCOPY  2012  . GANGLION CYST EXCISION Right    right- wrist   . NASAL SEPTUM SURGERY    . NASAL SINUS SURGERY    . TMJ ARTHROSCOPY     left  . TOTAL LAPAROSCOPIC HYSTERECTOMY WITH BILATERAL SALPINGO OOPHORECTOMY Bilateral 10/12/2019   Procedure: TOTAL LAPAROSCOPIC HYSTERECTOMY WITH BILATERAL SALPINGO OOPHORECTOMY, Lysis Pelvic Adhseions ;  Surgeon: Sherlyn Hay, DO;  Location: [facility];  Service: Gynecology;  Laterality: Bilateral;    Family History    Problem Relation Age of Onset  . Hypertension Father   . Diabetes Father   . Hyperlipidemia Father   . Breast cancer Mother 89  . Cancer Mother        breast  . Thyroid disease Mother   . Sleep apnea Mother   . Colon cancer Paternal Grandfather        died at age 62  . Cancer Paternal Grandfather        GI CA  . Obesity Sister   . Diabetes Sister   . Hypertension Sister   . Heart disease Maternal Grandmother        MI  . Cancer Maternal Grandfather        lung, smoker  . Diabetes Maternal Grandfather   . Diabetes Paternal Grandmother   . Heart disease Paternal Grandmother   . Pancreatitis Sister   . Esophageal cancer Neg Hx   . Stomach cancer Neg Hx   . Stroke Neg Hx   . Colon polyps Neg Hx   . Rectal cancer Neg Hx   . Pancreatic cancer Neg Hx     Social History   Socioeconomic History  . Marital status: Single    Spouse name: Not on file  . Number of children: Not on file  . Years  of education: Not on file  . Highest education level: Not on file  Occupational History  . Occupation: Programmer, multimedia: [employer]  Tobacco Use  . Smoking status: Never Smoker  . Smokeless tobacco: Never Used  Vaping Use  . Vaping Use: Never used  Substance and Sexual Activity  . Alcohol use: Yes    Alcohol/week: 0.0 standard drinks    Comment: rare  . Drug use: No  . Sexual activity: Yes    Birth control/protection: I.U.D.  Other Topics Concern  . 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 Works at [employer]. No dietary restrictions. Lives with dog   Social Determinants of Health   Financial Resource Strain:   . Difficulty of Paying Living Expenses:   Food Insecurity:   . Worried About Charity fundraiser in the Last Year:   . Arboriculturist in the Last Year:   Transportation Needs:   . Film/video editor (Medical):   Marland Kitchen Lack of Transportation (Non-Medical):   Physical Activity:   . Days  of Exercise per Week:   . Minutes of Exercise per Session:   Stress:   . Feeling of Stress :   Social Connections:   . Frequency of Communication with Friends and Family:   . Frequency of Social Gatherings with Friends and Family:   . Attends Religious Services:   . Active Member of Clubs or Organizations:   . Attends Archivist Meetings:   Marland Kitchen Marital Status:   Intimate Partner Violence:   . Fear of Current or Ex-Partner:   . Emotionally Abused:   Marland Kitchen Physically Abused:   . Sexually Abused:      PHYSICAL EXAM:  VS: BP 115/70   Pulse 82   Ht 5\' 5"  (1.651 m)   Wt 208 lb (94.3 kg)   LMP 11/22/2018 (Approximate)   BMI 34.61 kg/m  Physical Exam Gen: NAD, alert, cooperative with exam, well-appearing  ASSESSMENT & PLAN:   Peroneal tendinitis of left lower leg Has had continued improvement.  Denies any pain today.  Reports crepitus from time to time. -Counseled on supportive care. -Could consider injection.  Left knee pain Has had significant improvement with NSAIDs.  Denies any pain today. -Counseled on supportive care. -Can provide with Pennsaid samples when available.

## 2020-07-04 NOTE — Assessment & Plan Note (Signed)
Has had continued improvement.  Denies any pain today.  Reports crepitus from time to time. -Counseled on supportive care. -Could consider injection.

## 2020-07-04 NOTE — Patient Instructions (Signed)
Good to see you Please stop by if you want more pennsaid   Please send me a message in MyChart with any questions or updates.  Please see [name] back as needed.   --Dr. Raeford Razor

## 2020-07-05 MED FILL — ESCITALOPRAM 10 MG TABLET: 10 | 90 days supply | Qty: 90 | Fill #1

## 2020-07-06 ENCOUNTER — Telehealth: Payer: Self-pay | Admitting: Family Medicine

## 2020-07-06 NOTE — Telephone Encounter (Signed)
Left VM for patient. If sh calls back please have her speak with a nurse/CMA and inform we have more pennsaid if she wants to stop by and get some.   If any questions then please take the best time and phone number to call and I will try to call her back.   Rosemarie Ax, MD [facility] 07/06/2020, 8:13 AM

## 2020-07-10 ENCOUNTER — Telehealth: Payer: Self-pay

## 2020-07-10 ENCOUNTER — Other Ambulatory Visit: Payer: Self-pay

## 2020-07-10 ENCOUNTER — Encounter (INDEPENDENT_AMBULATORY_CARE_PROVIDER_SITE_OTHER): Payer: Self-pay | Admitting: Physician Assistant

## 2020-07-10 ENCOUNTER — Ambulatory Visit (INDEPENDENT_AMBULATORY_CARE_PROVIDER_SITE_OTHER): Payer: 59 | Admitting: Physician Assistant

## 2020-07-10 VITALS — BP 118/76 | HR 79 | Temp 98.3°F | Ht 65.0 in | Wt 211.0 lb

## 2020-07-10 DIAGNOSIS — F3289 Other specified depressive episodes: Secondary | ICD-10-CM | POA: Diagnosis not present

## 2020-07-10 DIAGNOSIS — E559 Vitamin D deficiency, unspecified: Secondary | ICD-10-CM | POA: Diagnosis not present

## 2020-07-10 DIAGNOSIS — Z9189 Other specified personal risk factors, not elsewhere classified: Secondary | ICD-10-CM

## 2020-07-10 DIAGNOSIS — Z6835 Body mass index (BMI) 35.0-35.9, adult: Secondary | ICD-10-CM

## 2020-07-10 MED ORDER — BUPROPION HCL ER (SR) 150 MG PO TB12: 150.0000 mg | ORAL_TABLET | Freq: Every day | ORAL | 0 refills | Status: DC

## 2020-07-10 NOTE — Telephone Encounter (Signed)
LVM for pt to call me back to schedule sleep study  

## 2020-07-11 NOTE — Progress Notes (Signed)
Chief Complaint:   Hailey Noble is here to discuss her progress with her obesity treatment plan along with follow-up of her obesity related diagnoses. Hailey Noble is on the Category 2 Plan + 200 calories/journaling and states she is following her eating plan approximately 90% of the time. Hailey Noble states she is walking/swimming 45 minutes 3 times per week.  Today's visit was #: 73 Starting weight: 212 lbs Starting date: 10/06/2017 Today's weight: 211 lbs Today's date: 07/10/2020 Total lbs lost to date: 1 Total lbs lost since last in-office visit: 0  Interim History: Hailey Noble reports that she did not eat consistently on plan while on vacation. She wants to stay on a structured plan rather than journal.  Subjective:   Other depression, with emotional eating. Hailey Noble is struggling with emotional eating and using food for comfort to the extent that it is negatively impacting her health. She has been working on behavior modification techniques to help reduce her emotional eating and has been somewhat successful. She shows no sign of suicidal or homicidal ideations. Hailey Noble denies cravings. Blood pressure is normal.  Vitamin D deficiency. No nausea, vomiting, or muscle weakness on Vitamin D supplementation.    Ref. Range 04/10/2020 12:46  Vitamin D, 25-Hydroxy Latest Ref Range: 30.0 - 100.0 ng/mL 40.2   At risk for osteoporosis. Hailey Noble is at higher risk of osteopenia and osteoporosis due to Vitamin D deficiency.   Assessment/Plan:   Other depression, with emotional eating. Behavior modification techniques were discussed today to help Hailey Noble deal with her emotional/non-hunger eating behaviors.  Orders and follow up as documented in patient record. Refill was given for buPROPion (WELLBUTRIN SR) 150 MG 12 hr tablet #90 with 0 refills.  Vitamin D deficiency. Low Vitamin D level contributes to fatigue and are associated with obesity, breast, and colon cancer. She agrees to continue to take  Vitamin D as directed and will follow-up for routine testing of Vitamin D, at least 2-3 times per year to avoid over-replacement.  At risk for osteoporosis. Hailey Noble was given approximately 15 minutes of osteoporosis prevention counseling today. Hailey Noble is at risk for osteopenia and osteoporosis due to her Vitamin D deficiency. She was encouraged to take her Vitamin D and follow her higher calcium diet and increase strengthening exercise to help strengthen her bones and decrease her risk of osteopenia and osteoporosis.  Repetitive spaced learning was employed today to elicit superior memory formation and behavioral change.  Class 2 severe obesity with serious comorbidity and body mass index (BMI) of 35.0 to 35.9 in adult, unspecified obesity type (Hailey Noble).  Hailey Noble is currently in the action stage of change. As such, her goal is to continue with weight loss efforts. She has agreed to the Category 2 Plan + 200 calories.   Exercise goals: For substantial health benefits, adults should do at least 150 minutes (2 hours and 30 minutes) a week of moderate-intensity, or 75 minutes (1 hour and 15 minutes) a week of vigorous-intensity aerobic physical activity, or an equivalent combination of moderate- and vigorous-intensity aerobic activity. Aerobic activity should be performed in episodes of at least 10 minutes, and preferably, it should be spread throughout the week.  Behavioral modification strategies: meal planning and cooking strategies and keeping healthy foods in the home.  Hailey Noble has agreed to follow-up with our clinic in 2-3 weeks. She was informed of the importance of frequent follow-up visits to maximize her success with intensive lifestyle modifications for her multiple health conditions.   Objective:  Blood pressure 118/76, pulse 79, temperature 98.3 F (36.8 C), temperature source Oral, height 5\' 5"  (1.651 m), weight 211 lb (95.7 kg), last menstrual period 11/22/2018, SpO2 95 %. Body mass index  is 35.11 kg/m.  General: Cooperative, alert, well developed, in no acute distress. HEENT: Conjunctivae and lids unremarkable. Cardiovascular: Regular rhythm.  Lungs: Normal work of breathing. Neurologic: No focal deficits.   Lab Results  Component Value Date   CREATININE 0.55 06/01/2020   BUN 23 06/01/2020   NA 137 06/01/2020   K 4.4 06/01/2020   CL 104 06/01/2020   CO2 29 06/01/2020   Lab Results  Component Value Date   ALT 26 06/01/2020   AST 20 06/01/2020   ALKPHOS 138 (H) 06/01/2020   BILITOT 0.4 06/01/2020   Lab Results  Component Value Date   HGBA1C 5.4 12/02/2019   HGBA1C 5.3 10/08/2019   HGBA1C 5.4 06/16/2019   HGBA1C 5.4 12/30/2018   HGBA1C 5.4 09/21/2018   Lab Results  Component Value Date   INSULIN 10.1 04/10/2020   INSULIN 9.3 12/30/2018   INSULIN 11.1 09/21/2018   INSULIN 8.7 04/30/2018   INSULIN 8.8 10/06/2017   Lab Results  Component Value Date   TSH 1.47 06/01/2020   Lab Results  Component Value Date   CHOL 180 06/01/2020   HDL 47.80 06/01/2020   LDLCALC 95 06/01/2020   TRIG 185.0 (H) 06/01/2020   CHOLHDL 4 06/01/2020   Lab Results  Component Value Date   WBC 7.7 06/01/2020   HGB 14.0 06/01/2020   HCT 41.7 06/01/2020   MCV 89.0 06/01/2020   PLT 349.0 06/01/2020   Lab Results  Component Value Date   IRON 94 06/01/2020   TIBC 363 06/01/2020   FERRITIN 36 06/01/2020   Attestation Statements:   Reviewed by clinician on day of visit: allergies, medications, problem list, medical history, surgical history, family history, social history, and previous encounter notes.  IMichaelene Song, am acting as transcriptionist for Abby Potash, PA-C   I have reviewed the above documentation for accuracy and completeness, and I agree with the above. Abby Potash, PA-C

## 2020-07-20 ENCOUNTER — Ambulatory Visit (INDEPENDENT_AMBULATORY_CARE_PROVIDER_SITE_OTHER): Payer: 59 | Admitting: Neurology

## 2020-07-20 DIAGNOSIS — R519 Headache, unspecified: Secondary | ICD-10-CM

## 2020-07-20 DIAGNOSIS — G4733 Obstructive sleep apnea (adult) (pediatric): Secondary | ICD-10-CM

## 2020-07-20 DIAGNOSIS — G4719 Other hypersomnia: Secondary | ICD-10-CM

## 2020-07-20 DIAGNOSIS — E669 Obesity, unspecified: Secondary | ICD-10-CM

## 2020-07-20 DIAGNOSIS — G4720 Circadian rhythm sleep disorder, unspecified type: Secondary | ICD-10-CM

## 2020-07-20 DIAGNOSIS — Z820 Family history of epilepsy and other diseases of the nervous system: Secondary | ICD-10-CM

## 2020-07-20 DIAGNOSIS — R351 Nocturia: Secondary | ICD-10-CM

## 2020-08-02 MED FILL — BUPROPION HCL ER (SR) 150 M: 150 | 90 days supply | Qty: 90 | Fill #0

## 2020-08-03 ENCOUNTER — Other Ambulatory Visit: Payer: Self-pay

## 2020-08-03 ENCOUNTER — Ambulatory Visit (INDEPENDENT_AMBULATORY_CARE_PROVIDER_SITE_OTHER): Payer: 59 | Admitting: Physician Assistant

## 2020-08-03 ENCOUNTER — Encounter (INDEPENDENT_AMBULATORY_CARE_PROVIDER_SITE_OTHER): Payer: Self-pay | Admitting: Physician Assistant

## 2020-08-03 ENCOUNTER — Ambulatory Visit: Payer: 59 | Admitting: Family Medicine

## 2020-08-03 VITALS — BP 128/69 | Ht 65.0 in | Wt 210.0 lb

## 2020-08-03 VITALS — BP 122/74 | HR 85 | Temp 98.3°F | Ht 65.0 in | Wt 210.0 lb

## 2020-08-03 DIAGNOSIS — E559 Vitamin D deficiency, unspecified: Secondary | ICD-10-CM

## 2020-08-03 DIAGNOSIS — S76312A Strain of muscle, fascia and tendon of the posterior muscle group at thigh level, left thigh, initial encounter: Secondary | ICD-10-CM | POA: Diagnosis not present

## 2020-08-03 DIAGNOSIS — Z6835 Body mass index (BMI) 35.0-35.9, adult: Secondary | ICD-10-CM

## 2020-08-03 DIAGNOSIS — Z9189 Other specified personal risk factors, not elsewhere classified: Secondary | ICD-10-CM

## 2020-08-03 MED ORDER — VITAMIN D (ERGOCALCIFEROL) 1.25 MG (50000 UNIT) PO CAPS: 50000.0000 [IU] | ORAL_CAPSULE | ORAL | 0 refills | Status: DC

## 2020-08-03 MED ORDER — SAXENDA 18 MG/3ML ~~LOC~~ SOPN: 3.0000 mg | PEN_INJECTOR | Freq: Every day | SUBCUTANEOUS | 0 refills | Status: DC

## 2020-08-03 MED FILL — VIT D2 1.25 MG (50,000 UNIT: 1.25 MG | 28 days supply | Qty: 4 | Fill #0

## 2020-08-03 NOTE — Patient Instructions (Signed)
Good to see you Please try ice and compression  Please try ibuprofen 600 mg three times a day for three days  Please try the exercises   Please send me a message in MyChart with any questions or updates.  Please see me back in  4 weeks.   --Dr. Raeford Razor

## 2020-08-03 NOTE — Assessment & Plan Note (Addendum)
Pain over the biceps femoris distal aspect. Occurred with leg in extension  -Counseled on home exercise therapy and supportive care. -Counseled on compression. -Could consider physical therapy  -Provided Pennsaid samples.   - Counseled on taking ibuprofen

## 2020-08-03 NOTE — Progress Notes (Signed)
Hailey Noble - 54 y.o. female MRN [id number]  Date of birth: 1966/12/14  SUBJECTIVE:  Including CC & ROS.  No chief complaint on file.   Hailey Noble is a 54 y.o. female that is presenting with acute left knee pain.  The pain is occurring in the posterior lateral aspect of the knee.  She felt this after doing Pilates and leg extensions.  Localized to the knee.  Seems worse with any kind of flexion.   Review of Systems See HPI   HISTORY: Past Medical, Surgical, Social, and Family History Reviewed & Updated per EMR.   Pertinent Historical Findings include:  Past Medical History:  Diagnosis Date  . Allergic state 12/08/2016  . Allergy   . Anxiety   . Back pain    " i just have a weak loer back"   . Fibroids   . GERD (gastroesophageal reflux disease)   . Hyperlipidemia 06/09/2017  . Insulin resistance   . Joint pain   . Knee pain, right 03/10/2017  . Plantar fasciitis   . PONV (postoperative nausea and vomiting)    just nausea   . Shortness of breath    not only if  i exert myselg   . Sinusitis   . Vitamin D deficiency 11/22/2016  . Vitamin D deficiency     Past Surgical History:  Procedure Laterality Date  . ABDOMINAL HYSTERECTOMY  09/2019   [place]  . COLONOSCOPY  02-2009   with Henrene Pastor normal  . CYSTOSCOPY N/A 10/12/2019   Procedure: CYSTOSCOPY;  Surgeon: Sherlyn Hay, DO;  Location: [facility];  Service: Gynecology;  Laterality: N/A;  . ESOPHAGOGASTRODUODENOSCOPY  2012  . GANGLION CYST EXCISION Right    right- wrist   . NASAL SEPTUM SURGERY    . NASAL SINUS SURGERY    . TMJ ARTHROSCOPY     left  . TOTAL LAPAROSCOPIC HYSTERECTOMY WITH BILATERAL SALPINGO OOPHORECTOMY Bilateral 10/12/2019   Procedure: TOTAL LAPAROSCOPIC HYSTERECTOMY WITH BILATERAL SALPINGO OOPHORECTOMY, Lysis Pelvic Adhseions ;  Surgeon: Sherlyn Hay, DO;  Location: [facility];  Service: Gynecology;  Laterality: Bilateral;    Family History  Problem  Relation Age of Onset  . Hypertension Father   . Diabetes Father   . Hyperlipidemia Father   . Breast cancer Mother 23  . Cancer Mother        breast  . Thyroid disease Mother   . Sleep apnea Mother   . Colon cancer Paternal Grandfather        died at age 29  . Cancer Paternal Grandfather        GI CA  . Obesity Sister   . Diabetes Sister   . Hypertension Sister   . Heart disease Maternal Grandmother        MI  . Cancer Maternal Grandfather        lung, smoker  . Diabetes Maternal Grandfather   . Diabetes Paternal Grandmother   . Heart disease Paternal Grandmother   . Pancreatitis Sister   . Esophageal cancer Neg Hx   . Stomach cancer Neg Hx   . Stroke Neg Hx   . Colon polyps Neg Hx   . Rectal cancer Neg Hx   . Pancreatic cancer Neg Hx     Social History   Socioeconomic History  . Marital status: Single    Spouse name: Not on file  . Number of children: Not on file  . Years of education: Not on file  .  Highest education level: Not on file  Occupational History  . Occupation: Programmer, multimedia: [employer]  Tobacco Use  . Smoking status: Never Smoker  . Smokeless tobacco: Never Used  Vaping Use  . Vaping Use: Never used  Substance and Sexual Activity  . Alcohol use: Yes    Alcohol/week: 0.0 standard drinks    Comment: rare  . Drug use: No  . Sexual activity: Yes    Birth control/protection: I.U.D.  Other Topics Concern  . 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 Works at [employer]. No dietary restrictions. Lives with dog   Social Determinants of Health   Financial Resource Strain:   . Difficulty of Paying Living Expenses:   Food Insecurity:   . Worried About Charity fundraiser in the Last Year:   . Arboriculturist in the Last Year:   Transportation Needs:   . Film/video editor (Medical):   Marland Kitchen Lack of Transportation (Non-Medical):   Physical Activity:   . Days of  Exercise per Week:   . Minutes of Exercise per Session:   Stress:   . Feeling of Stress :   Social Connections:   . Frequency of Communication with Friends and Family:   . Frequency of Social Gatherings with Friends and Family:   . Attends Religious Services:   . Active Member of Clubs or Organizations:   . Attends Archivist Meetings:   Marland Kitchen Marital Status:   Intimate Partner Violence:   . Fear of Current or Ex-Partner:   . Emotionally Abused:   Marland Kitchen Physically Abused:   . Sexually Abused:      PHYSICAL EXAM:  VS: BP 128/69   Ht 5\' 5"  (1.651 m)   Wt 210 lb (95.3 kg)   LMP 11/22/2018 (Approximate)   BMI 34.95 kg/m  Physical Exam Gen: NAD, alert, cooperative with exam, well-appearing MSK:  Left knee: Normal range of motion. Normal strength resistance. Tenderness to palpation of the distal hamstring. Neurovascular intact     ASSESSMENT & PLAN:   Strain of left hamstring Pain over the biceps femoris distal aspect. Occurred with leg in extension  -Counseled on home exercise therapy and supportive care. -Counseled on compression. -Could consider physical therapy  -Provided Pennsaid samples.   - Counseled on taking ibuprofen

## 2020-08-03 NOTE — Progress Notes (Signed)
Medication Samples have been provided to the patient.  Drug name: pensaid        Strength: 2%     Qty: 2 boxes  LOT: [id number]   Exp.Date: 01/23/21  Dosing instructions: use a pea size amount and rub gently  The patient has been instructed regarding the correct time, dose, and frequency of taking this medication, including desired effects and most common side effects.   April Manson 3:41 PM 08/03/2020

## 2020-08-07 ENCOUNTER — Telehealth: Payer: Self-pay

## 2020-08-07 NOTE — Procedures (Signed)
PATIENT'S NAME:  Hailey Noble, Hailey Noble DOB:      21-Dec-1966      MR#:    [id number]     DATE OF RECORDING: 07/20/2020 REFERRING M.D.:  Abby Potash, PA  Study Performed:   Baseline Polysomnogram HISTORY: 54 year old woman with a history of reflux disease, hyperlipidemia, anxiety, allergies, vitamin D deficiency, insulin resistance and obesity, who reports excessive daytime somnolence and difficulty achieving weight loss. The patient endorsed the Epworth Sleepiness Scale at 19 points. The patient's weight 214 pounds with a height of 65 (inches), resulting in a BMI of 35.6 kg/m2. The patient's neck circumference measured 16.25 inches.  CURRENT MEDICATIONS: Tylenol, Wellbutrin, Calcium, Vitamin D, Lexapro, Krill Oil, Glucophage, Nasonex, Multivitamin with minerals   PROCEDURE:  This is a multichannel digital polysomnogram utilizing the Somnostar 11.2 system.  Electrodes and sensors were applied and monitored per AASM Specifications.   EEG, EOG, Chin and Limb EMG, were sampled at 200 Hz.  ECG, Snore and Nasal Pressure, Thermal Airflow, Respiratory Effort, CPAP Flow and Pressure, Oximetry was sampled at 50 Hz. Digital video and audio were recorded.      BASELINE STUDY  Lights Out was at 22:09 and Lights On at 05:03.  Total recording time (TRT) was 414 minutes, with a total sleep time (TST) of 350.5 minutes.   The patient's sleep latency was 3 minutes.  REM latency was 160 minutes, which is delayed. The sleep efficiency was 84.7 %.     SLEEP ARCHITECTURE: WASO (Wake after sleep onset) was 61 minutes with mild sleep fragmentation noted.  There were 23 minutes in Stage N1, 213.5 minutes Stage N2, 59.5 minutes Stage N3 and 54.5 minutes in Stage REM.  The percentage of Stage N1 was 6.6%, Stage N2 was 60.9%, which is increased, Stage N3 was 17.% and Stage R (REM sleep) was 15.5%, which is mildly reduced. The arousals were noted as: 74 were spontaneous, 0 were associated with PLMs, 160 were associated with respiratory  events.  RESPIRATORY ANALYSIS:  There were a total of 257 respiratory events:  40 obstructive apneas, 0 central apneas and 0 mixed apneas with a total of 40 apneas and an apnea index (AI) of 6.8 /hour. There were 217 hypopneas with a hypopnea index of 37.1 /hour. The patient also had 0 respiratory event related arousals (RERAs).      The total APNEA/HYPOPNEA INDEX (AHI) was 44./hour and the total RESPIRATORY DISTURBANCE INDEX was  44. /hour.  67 events occurred in REM sleep and 360 events in NREM. The REM AHI was  73.8 /hour, versus a non-REM AHI of 38.5. The patient spent 124.5 minutes of total sleep time in the supine position and 226 minutes in non-supine.. The supine AHI was 64.6 versus a non-supine AHI of 32.6.  OXYGEN SATURATION & C02:  The Wake baseline 02 saturation was 92%, with the lowest being 74%. Time spent below 89% saturation equaled 63 minutes.  PERIODIC LIMB MOVEMENTS:   The patient had a total of 0 Periodic Limb Movements.  The Periodic Limb Movement (PLM) index was 0 and the PLM Arousal index was 0/hour.  Audio and video analysis did not show any abnormal or unusual movements, behaviors, phonations or vocalizations. The patient took 1 bathroom break. Moderate to loud snoring was noted. The EKG was in keeping with normal sinus rhythm (NSR).  Post-study, the patient indicated that sleep was the same as usual.   IMPRESSION:  1. Obstructive Sleep Apnea (OSA) 2. Dysfunctions associated with sleep stages or arousal from sleep  RECOMMENDATIONS:  1. This study demonstrates severe obstructive sleep apnea, with a total AHI of 44/hour, REM AHI of 73.8/hour, supine AHI of 64.6/hour and O2 nadir of 74%. Treatment with positive airway pressure in the form of CPAP is recommended. This will require a full night titration study to optimize therapy. Other treatment options may include avoidance of supine sleep position along with weight loss, upper airway or jaw surgery in selected patients or  the use of an oral appliance in certain patients. ENT evaluation and/or consultation with a maxillofacial surgeon or dentist may be feasible in some instances.    2. Please note that untreated obstructive sleep apnea may carry additional perioperative morbidity. Patients with significant obstructive sleep apnea should receive perioperative PAP therapy and the surgeons and particularly the anesthesiologist should be informed of the diagnosis and the severity of the sleep disordered breathing. 3. This study shows sleep fragmentation and abnormal sleep stage percentages; these are nonspecific findings and per se do not signify an intrinsic sleep disorder or a cause for the patient's sleep-related symptoms. Causes include (but are not limited to) the first night effect of the sleep study, circadian rhythm disturbances, medication effect or an underlying mood disorder or medical problem.  4. The patient should be cautioned not to drive, work at heights, or operate dangerous or heavy equipment when tired or sleepy. Review and reiteration of good sleep hygiene measures should be pursued with any patient. 5. The patient will be seen in follow-up in the sleep clinic at [facility] for discussion of the test results, symptom and treatment compliance review, further management strategies, etc. The referring provider will be notified of the test results.  I certify that I have reviewed the entire raw data recording prior to the issuance of this report in accordance with the Standards of Accreditation of the American Academy of Sleep Medicine (AASM)   Star Age, MD, PhD Diplomat, American Board of Neurology and Sleep Medicine (Neurology and Sleep Medicine)

## 2020-08-07 NOTE — Telephone Encounter (Signed)
I called pt. No answer, left a message asking pt to call me back.   

## 2020-08-07 NOTE — Addendum Note (Signed)
Addended by: Star Age on: 08/07/2020 12:35 PM   Modules accepted: Orders

## 2020-08-07 NOTE — Telephone Encounter (Signed)
-----   Message from Star Age, MD sent at 08/07/2020 12:35 PM EDT ----- Patient referred by Abby Potash with MWM, seen by me on 07/04/20, diagnostic PSG on 07/20/20.   Please call and notify the patient that the recent sleep study showed severe obstructive sleep apnea. I recommend treatment for this in the form of CPAP. This will require a repeat sleep study for proper titration and mask fitting and correct monitoring of the oxygen saturations. Please explain to patient. I have placed an order in the chart. Thanks.  Star Age, MD, PhD [facility])

## 2020-08-07 NOTE — Progress Notes (Signed)
Chief Complaint:   Hailey Noble is here to discuss her progress with her obesity treatment plan along with follow-up of her obesity related diagnoses. Hailey Noble is on the Category 2 Plan + 200 calories and states she is following her eating plan approximately 90% of the time. Hailey Noble states she is swimming 45 minutes 2 times per week.  Today's visit was #: 56 Starting weight: 212 lbs Starting date: 10/06/2017 Today's weight: 210 lbs Today's date: 08/03/2020 Total lbs lost to date: 2 Total lbs lost since last in-office visit: 1  Interim History: Hailey Noble reports that Wellbutrin has completely taken away her cravings and appetite. She was not able to get in all of her food. She is only eating chicken and yogurt currently.  Subjective:   Vitamin D deficiency. Hailey Noble is on prescription Vitamin D supplementation daily. Last level was not at goal.   Ref. Range 04/10/2020 12:46  Vitamin D, 25-Hydroxy Latest Ref Range: 30.0 - 100.0 ng/mL 40.2   At risk for osteoporosis. Hailey Noble is at higher risk of osteopenia and osteoporosis due to Vitamin D deficiency.   Assessment/Plan:   Vitamin D deficiency. Low Vitamin D level contributes to fatigue and are associated with obesity, breast, and colon cancer. She was given a refill on her Vitamin D, Ergocalciferol, (DRISDOL) 1.25 MG (50000 UNIT) CAPS capsule every week #4 with 0 refills and will follow-up for routine testing of Vitamin D, at least 2-3 times per year to avoid over-replacement.   At risk for osteoporosis. Hailey Noble was given approximately 15 minutes of osteoporosis prevention counseling today. Hailey Noble is at risk for osteopenia and osteoporosis due to her Vitamin D deficiency. She was encouraged to take her Vitamin D and follow her higher calcium diet and increase strengthening exercise to help strengthen her bones and decrease her risk of osteopenia and osteoporosis.  Repetitive spaced learning was employed today to elicit superior memory  formation and behavioral change.  Class 2 severe obesity with serious comorbidity and body mass index (BMI) of 35.0 to 35.9 in adult, unspecified obesity type (Hailey Noble). Hailey Noble will start Liraglutide - Weight Management (SAXENDA) 18 MG/3ML SOPN 0.6 mg #5 pens.  Hailey Noble is currently in the action stage of change. As such, her goal is to continue with weight loss efforts. She has agreed to the Category 2 Plan with 200-300 protein calories and will journal 1400-1500 calories and 95 grams of protein daily.   Exercise goals: For substantial health benefits, adults should do at least 150 minutes (2 hours and 30 minutes) a week of moderate-intensity, or 75 minutes (1 hour and 15 minutes) a week of vigorous-intensity aerobic physical activity, or an equivalent combination of moderate- and vigorous-intensity aerobic activity. Aerobic activity should be performed in episodes of at least 10 minutes, and preferably, it should be spread throughout the week.  Behavioral modification strategies: no skipping meals and meal planning and cooking strategies.  Hailey Noble has agreed to follow-up with our clinic in 2-3 weeks. She was informed of the importance of frequent follow-up visits to maximize her success with intensive lifestyle modifications for her multiple health conditions.   Objective:   Blood pressure 122/74, pulse 85, temperature 98.3 F (36.8 C), temperature source Oral, height 5\' 5"  (1.651 m), weight 210 lb (95.3 kg), last menstrual period 11/22/2018, SpO2 95 %. Body mass index is 34.95 kg/m.  General: Cooperative, alert, well developed, in no acute distress. HEENT: Conjunctivae and lids unremarkable. Cardiovascular: Regular rhythm.  Lungs: Normal work of breathing. Neurologic:  No focal deficits.   Lab Results  Component Value Date   CREATININE 0.55 06/01/2020   BUN 23 06/01/2020   NA 137 06/01/2020   K 4.4 06/01/2020   CL 104 06/01/2020   CO2 29 06/01/2020   Lab Results  Component Value Date    ALT 26 06/01/2020   AST 20 06/01/2020   ALKPHOS 138 (H) 06/01/2020   BILITOT 0.4 06/01/2020   Lab Results  Component Value Date   HGBA1C 5.4 12/02/2019   HGBA1C 5.3 10/08/2019   HGBA1C 5.4 06/16/2019   HGBA1C 5.4 12/30/2018   HGBA1C 5.4 09/21/2018   Lab Results  Component Value Date   INSULIN 10.1 04/10/2020   INSULIN 9.3 12/30/2018   INSULIN 11.1 09/21/2018   INSULIN 8.7 04/30/2018   INSULIN 8.8 10/06/2017   Lab Results  Component Value Date   TSH 1.47 06/01/2020   Lab Results  Component Value Date   CHOL 180 06/01/2020   HDL 47.80 06/01/2020   LDLCALC 95 06/01/2020   TRIG 185.0 (H) 06/01/2020   CHOLHDL 4 06/01/2020   Lab Results  Component Value Date   WBC 7.7 06/01/2020   HGB 14.0 06/01/2020   HCT 41.7 06/01/2020   MCV 89.0 06/01/2020   PLT 349.0 06/01/2020   Lab Results  Component Value Date   IRON 94 06/01/2020   TIBC 363 06/01/2020   FERRITIN 36 06/01/2020   Attestation Statements:   Reviewed by clinician on day of visit: allergies, medications, problem list, medical history, surgical history, family history, social history, and previous encounter notes.  IMichaelene Song, am acting as transcriptionist for Abby Potash, PA-C   I have reviewed the above documentation for accuracy and completeness, and I agree with the above. Abby Potash, PA-C

## 2020-08-07 NOTE — Progress Notes (Signed)
Patient referred by Abby Potash with MWM, seen by me on 07/04/20, diagnostic PSG on 07/20/20.   Please call and notify the patient that the recent sleep study showed severe obstructive sleep apnea. I recommend treatment for this in the form of CPAP. This will require a repeat sleep study for proper titration and mask fitting and correct monitoring of the oxygen saturations. Please explain to patient. I have placed an order in the chart. Thanks.  Star Age, MD, PhD [facility])

## 2020-08-09 NOTE — Telephone Encounter (Signed)
I called pt. I advised pt that Dr. Garlan Fair reviewed their sleep study results and found that severe osa was found and recommends that pt be treated with a cpap. Dr. Rexene Alberts recommends that pt return for a repeat sleep study in order to properly titrate the cpap and ensure a good mask fit. Pt is agreeable to returning for a titration study. I advised pt that our sleep lab will file with pt's insurance and call pt to schedule the sleep study when we hear back from the pt's insurance regarding coverage of this sleep study. Pt verbalized understanding of results. Pt had no questions at this time but was encouraged to call back if questions arise.

## 2020-08-09 NOTE — Telephone Encounter (Signed)
I called pt. No answer, left a message asking pt to call me back.   

## 2020-08-11 ENCOUNTER — Encounter: Payer: 59 | Attending: Family Medicine | Admitting: Registered"

## 2020-08-11 DIAGNOSIS — Z713 Dietary counseling and surveillance: Secondary | ICD-10-CM

## 2020-08-14 ENCOUNTER — Ambulatory Visit (INDEPENDENT_AMBULATORY_CARE_PROVIDER_SITE_OTHER): Payer: 59 | Admitting: Neurology

## 2020-08-14 DIAGNOSIS — G4719 Other hypersomnia: Secondary | ICD-10-CM

## 2020-08-14 DIAGNOSIS — E669 Obesity, unspecified: Secondary | ICD-10-CM

## 2020-08-14 DIAGNOSIS — G4733 Obstructive sleep apnea (adult) (pediatric): Secondary | ICD-10-CM

## 2020-08-14 DIAGNOSIS — G4720 Circadian rhythm sleep disorder, unspecified type: Secondary | ICD-10-CM

## 2020-08-14 DIAGNOSIS — Z820 Family history of epilepsy and other diseases of the nervous system: Secondary | ICD-10-CM

## 2020-08-14 DIAGNOSIS — R519 Headache, unspecified: Secondary | ICD-10-CM

## 2020-08-14 DIAGNOSIS — R351 Nocturia: Secondary | ICD-10-CM

## 2020-08-17 ENCOUNTER — Encounter: Payer: Self-pay | Admitting: Registered"

## 2020-08-17 NOTE — Progress Notes (Signed)
On 08/11/20 patient completed Core Session 1 of Diabetes Prevention Program course virtually with [facility]. The following learning objectives were met by the patient during this class:   Learning Objectives:   Be able to explain the purpose and benefits of the National Diabetes Prevention Program.   Be able to describe the events that will take place at every session.   Know the weight loss and physical activity goals established by the [facility] Diabetes Prevention Program.   Know their own individual weight loss and physical activity goals.   Be able to explain the important effect of self-monitoring on behavior change.   Goals:  . Record food and beverage intake in "Food and Activity Tracker" over the next week.  . E-mail completed "Food and Activity Tracker" to Lifestyle Coach next week before session 2. . Circle the foods or beverages you think are highest in fat and calories in your food tracker. . Read the labels on the food you buy, and consider using measuring cups and spoons to help you calculate the amount you eat. We will talk about measuring in more detail in the coming weeks.   Follow-Up Plan:  Attend Core Session 2 next week.   E-mail completed "Food and Activity Tracker" to Lifestyle Coach next week before class

## 2020-08-18 ENCOUNTER — Encounter: Payer: 59 | Admitting: Registered"

## 2020-08-18 ENCOUNTER — Encounter: Payer: Self-pay | Admitting: Registered"

## 2020-08-18 DIAGNOSIS — Z713 Dietary counseling and surveillance: Secondary | ICD-10-CM

## 2020-08-18 NOTE — Progress Notes (Signed)
On 08/18/20 patient completed Core Session 2 of Diabetes Prevention Program course virtually with [facility]. The following learning objectives were met by the patient during this class:   Learning Objectives:  Self-monitor their weight during the weeks following Session 2.   Describe the relationship between fat and calories.   Explain the reason for, and basic principles of, self-monitoring fat grams and calories.   Identify their personal fat gram goals.   Use the ?Fat and Calorie Counter to calculate the calories and fat grams of a given selection of foods.   Keep a running total of the fat grams they eat each day.   Calculate fat, calories, and serving sizes from nutrition labels.   Goals:   Weigh yourself at the same time each day, or every few days, and record your weight in your Food and Activity Tracker.  Write down everything you eat and drink in your Food and Activity Tracker.  Measure portions as much as you can, and start reading labels.   Use the ?Fat and Calorie Counter to figure out the amount of fat and calories in what you ate, and write the amount down in your Food and Activity Tracker.  Keep a running fat gram total throughout the day. Come as close to your fat gram goal as you can.   Follow-Up Plan:  Attend Core Session 3 next week.   Email completed  "Food and Activity Tracker" to Lifestyle Coach next week.   

## 2020-08-21 ENCOUNTER — Encounter (INDEPENDENT_AMBULATORY_CARE_PROVIDER_SITE_OTHER): Payer: Self-pay

## 2020-08-21 ENCOUNTER — Other Ambulatory Visit: Payer: Self-pay

## 2020-08-21 ENCOUNTER — Ambulatory Visit (INDEPENDENT_AMBULATORY_CARE_PROVIDER_SITE_OTHER): Payer: 59 | Admitting: Physician Assistant

## 2020-08-21 VITALS — BP 124/51 | HR 71 | Temp 97.7°F | Ht 65.0 in | Wt 212.0 lb

## 2020-08-21 DIAGNOSIS — Z6835 Body mass index (BMI) 35.0-35.9, adult: Secondary | ICD-10-CM

## 2020-08-21 DIAGNOSIS — R7303 Prediabetes: Secondary | ICD-10-CM | POA: Diagnosis not present

## 2020-08-21 DIAGNOSIS — G4733 Obstructive sleep apnea (adult) (pediatric): Secondary | ICD-10-CM

## 2020-08-21 NOTE — Progress Notes (Signed)
Chief Complaint:   Hailey Noble is here to discuss her progress with her obesity treatment plan along with follow-up of her obesity related diagnoses. Hailey Noble is on the Category 2 Plan and journaling and states she is following her eating plan approximately 90% of the time. Hailey Noble states she is swimming 45 minutes 2 times per week.  Today's visit was #: 73 Starting weight: 212 lbs Starting date: 10/06/2017 Today's weight: 212 lbs Today's date: 08/21/2020 Total lbs lost to date: 0 Total lbs lost since last in-office visit: 0  Interim History: Hailey Noble is waiting for her CPAP to be delivered. She feels good off of the Wellbutrin. She is journaling intermittently and is reaching 1800 calories. Her Kirke Shaggy has not yet been approved.   Subjective:   Prediabetes. Hailey Noble has a diagnosis of prediabetes based on her elevated HgA1c and was informed this puts her at greater risk of developing diabetes. She continues to work on diet and exercise to decrease her risk of diabetes. She denies nausea or hypoglycemia. Hailey Noble is on metformin. No nausea, vomiting, or diarrhea. She endorses polyphagia.  Lab Results  Component Value Date   HGBA1C 5.4 12/02/2019   Lab Results  Component Value Date   INSULIN 10.1 04/10/2020   INSULIN 9.3 12/30/2018   INSULIN 11.1 09/21/2018   INSULIN 8.7 04/30/2018   INSULIN 8.8 10/06/2017   OSA (obstructive sleep apnea). Hailey Noble is awaiting her CPAP. She was diagnosed with OSA, severe, last week by a sleep study with Neurology.  Assessment/Plan:   Prediabetes. Hailey Noble will continue to work on weight loss, exercise, and decreasing simple carbohydrates to help decrease the risk of diabetes. She will continue her metformin as directed.   OSA (obstructive sleep apnea). Intensive lifestyle modifications are the first line treatment for this issue. We discussed several lifestyle modifications today and she will continue to work on diet, exercise and weight loss  efforts. We will continue to monitor. Orders and follow up as documented in patient record. Hailey Noble will follow-up with Neurology for CPAP.  Class 2 severe obesity with serious comorbidity and body mass index (BMI) of 35.0 to 35.9 in adult, unspecified obesity type (Hailey Noble).  Hailey Noble is currently in the action stage of change. As such, her goal is to continue with weight loss efforts. She has agreed to keeping a food journal and adhering to recommended goals of 1500-1600 calories and 100 grams of protein daily.   Exercise goals: For substantial health benefits, adults should do at least 150 minutes (2 hours and 30 minutes) a week of moderate-intensity, or 75 minutes (1 hour and 15 minutes) a week of vigorous-intensity aerobic physical activity, or an equivalent combination of moderate- and vigorous-intensity aerobic activity. Aerobic activity should be performed in episodes of at least 10 minutes, and preferably, it should be spread throughout the week.  Behavioral modification strategies: meal planning and cooking strategies and keeping a strict food journal.  Hailey Noble has agreed to follow-up with our clinic in 2-3 weeks. She was informed of the importance of frequent follow-up visits to maximize her success with intensive lifestyle modifications for her multiple health conditions.   Objective:   Blood pressure (!) 124/51, pulse 71, temperature 97.7 F (36.5 C), height 5\' 5"  (1.651 m), weight 212 lb (96.2 kg), last menstrual period 11/22/2018, SpO2 96 %. Body mass index is 35.28 kg/m.  General: Cooperative, alert, well developed, in no acute distress. HEENT: Conjunctivae and lids unremarkable. Cardiovascular: Regular rhythm.  Lungs: Normal work of  breathing. Neurologic: No focal deficits.   Lab Results  Component Value Date   CREATININE 0.55 06/01/2020   BUN 23 06/01/2020   NA 137 06/01/2020   K 4.4 06/01/2020   CL 104 06/01/2020   CO2 29 06/01/2020   Lab Results  Component Value Date    ALT 26 06/01/2020   AST 20 06/01/2020   ALKPHOS 138 (H) 06/01/2020   BILITOT 0.4 06/01/2020   Lab Results  Component Value Date   HGBA1C 5.4 12/02/2019   HGBA1C 5.3 10/08/2019   HGBA1C 5.4 06/16/2019   HGBA1C 5.4 12/30/2018   HGBA1C 5.4 09/21/2018   Lab Results  Component Value Date   INSULIN 10.1 04/10/2020   INSULIN 9.3 12/30/2018   INSULIN 11.1 09/21/2018   INSULIN 8.7 04/30/2018   INSULIN 8.8 10/06/2017   Lab Results  Component Value Date   TSH 1.47 06/01/2020   Lab Results  Component Value Date   CHOL 180 06/01/2020   HDL 47.80 06/01/2020   LDLCALC 95 06/01/2020   TRIG 185.0 (H) 06/01/2020   CHOLHDL 4 06/01/2020   Lab Results  Component Value Date   WBC 7.7 06/01/2020   HGB 14.0 06/01/2020   HCT 41.7 06/01/2020   MCV 89.0 06/01/2020   PLT 349.0 06/01/2020   Lab Results  Component Value Date   IRON 94 06/01/2020   TIBC 363 06/01/2020   FERRITIN 36 06/01/2020   Attestation Statements:   Reviewed by clinician on day of visit: allergies, medications, problem list, medical history, surgical history, family history, social history, and previous encounter notes.  Time spent on visit including pre-visit chart review and post-visit charting and care was 30 minutes.   IMichaelene Song, am acting as transcriptionist for Abby Potash, PA-C   I have reviewed the above documentation for accuracy and completeness, and I agree with the above. Abby Potash, PA-C

## 2020-08-22 ENCOUNTER — Other Ambulatory Visit: Payer: Self-pay | Admitting: Family Medicine

## 2020-08-22 MED ORDER — PREDNISONE 5 MG PO TABS
ORAL_TABLET | ORAL | 0 refills | Status: DC
Start: 2020-08-22 — End: 2020-10-12

## 2020-08-22 MED FILL — predniSONE 5 MG TABS: 5 | 6 days supply | Qty: 21 | Fill #0

## 2020-08-22 NOTE — Progress Notes (Signed)
Pain ongoing behind her knee. May be nerve related. Will provide prednisone.   Rosemarie Ax, MD [facility] 08/22/2020, 8:41 AM

## 2020-08-25 ENCOUNTER — Encounter: Payer: 59 | Admitting: Registered"

## 2020-08-25 NOTE — Progress Notes (Signed)
Patient referred by Abby Potash with [facility], seen by me on 07/04/20, diagnostic PSG on 07/20/20. Patient had a CPAP titration study on 08/14/20.  Please call and inform patient that I have entered an order for treatment with positive airway pressure (PAP) treatment for obstructive sleep apnea (OSA). She did well during the latest sleep study with CPAP. We will, therefore, arrange for a machine for home use through a DME (durable medical equipment) company of Her choice; and I will see the patient back in follow-up in about 10 weeks. Please also explain to the patient that I will be looking out for compliance data, which can be downloaded from the machine (stored on an SD card, that is inserted in the machine) or via remote access through a modem, that is built into the machine. At the time of the followup appointment we will discuss sleep study results and how it is going with PAP treatment at home. Please advise patient to bring Her machine at the time of the first FU visit, even though this is cumbersome. Bringing the machine for every visit after that will likely not be needed, but often helps for the first visit to troubleshoot if needed. Please re-enforce the importance of compliance with treatment and the need for [name] to monitor compliance data - often an insurance requirement and actually good feedback for the patient as far as how they are doing.  Also remind patient, that any interim PAP machine or mask issues should be first addressed with the DME company, as they can often help better with technical and mask fit issues. Please ask if patient has a preference regarding DME company.  Please also make sure, the patient has a follow-up appointment with me in about 10 weeks from the setup date, thanks. May see one of our nurse practitioners if needed for proper timing of the FU appointment.  Please fax or rout report to the referring provider. Thanks,   Star Age, MD, PhD [facility]  [facility])

## 2020-08-25 NOTE — Addendum Note (Signed)
Addended by: Star Age on: 08/25/2020 11:16 AM   Modules accepted: Orders

## 2020-08-25 NOTE — Procedures (Signed)
PATIENT'S NAME:  Hailey Noble, Hailey Noble DOB:      [birth year]      MR#:    [id number]     DATE OF RECORDING: 08/14/2020 REFERRING M.D.:  Penni Homans, MD Study Performed:   CPAP  Titration HISTORY: 54 year old woman with a history of reflux disease, hyperlipidemia, anxiety, allergies, vitamin D deficiency, insulin resistance and obesity, who presents for a full night CPAP titration study.  Her baseline sleep study on 07/20/2020 showed severe obstructive sleep apnea with an AHI of 44/h, O2 nadir of 74%.  Her Epworth sleepiness score is 19 out of 24, BMI 35.6, neck circumference 16.25 inches.  CURRENT MEDICATIONS: Tylenol, Wellbutrin, Calcium, Vitamin D, Lexapro, Krill Oil, Glucophage, Nasonex, Multivitamin with minerals  PROCEDURE:  This is a multichannel digital polysomnogram utilizing the SomnoStar 11.2 system.  Electrodes and sensors were applied and monitored per AASM Specifications.   EEG, EOG, Chin and Limb EMG, were sampled at 200 Hz.  ECG, Snore and Nasal Pressure, Thermal Airflow, Respiratory Effort, CPAP Flow and Pressure, Oximetry was sampled at 50 Hz. Digital video and audio were recorded.      The patient was fitted with a small DreamWear full facemask and CPAP was initiated per AASM standards and a pressure of 5 cm and gradually titrated to a final pressure of 14 cm.  On the final pressure her AHI was 0/h, O2 nadir 93% with supine REM sleep achieved.  Lights Out was at 21:37 and Lights On at 05:00. Total recording time (TRT) was 443 minutes, with a total sleep time (TST) of 361.5 minutes. The patient's sleep latency was 15 minutes. REM latency was 66 minutes, which is slightly reduced. The sleep efficiency was 81.6 %.    SLEEP ARCHITECTURE: WASO (Wake after sleep onset) with mild to moderate sleep fragmentation noted.  was 66 minutes.  There were 28.5 minutes in Stage N1, 75.5 minutes Stage N2, 133 minutes Stage N3 and 124.5 minutes in Stage REM.  The percentage of Stage N1 was 7.9%, Stage N2 was  20.9%, Stage N3 was 36.8%, which is increased, and Stage R (REM sleep) was 34.4%, which is increased, and in keeping with rebound. The arousals were noted as: 52 were spontaneous, 0 were associated with PLMs, 4 were associated with respiratory events.  RESPIRATORY ANALYSIS:  There was a total of 14 respiratory events: 0 obstructive apneas, 0 central apneas and 0 mixed apneas with a total of 0 apneas and an apnea index (AI) of 0 /hour. There were 14 hypopneas with a hypopnea index of 2.3/hour. The patient also had 0 respiratory event related arousals (RERAs).      The total APNEA/HYPOPNEA INDEX  (AHI) was 2.3 /hour and the total RESPIRATORY DISTURBANCE INDEX was 2.3 /hour  10 events occurred in REM sleep and 4 events in NREM. The REM AHI was 4.8 /hour versus a non-REM AHI of 1. /hour.  The patient spent 89 minutes of total sleep time in the supine position and 273 minutes in non-supine. The supine AHI was 9.4, versus a non-supine AHI of 0.0.  OXYGEN SATURATION & C02:  The baseline 02 saturation was 93%, with the lowest being 85%. Time spent below 89% saturation equaled 1 minutes.  PERIODIC LIMB MOVEMENTS:  The patient had a total of 0 Periodic Limb Movements. The Periodic Limb Movement (PLM) index was 0 and the PLM Arousal index was 0 /hour.  Audio and video analysis did not show any abnormal or unusual movements, behaviors, phonations or vocalizations. The patient took no  bathroom breaks. The EKG was in keeping with normal sinus rhythm (NSR).   Post-study, the patient indicated that sleep was better than usual. She stated that she felt very rested and wished she had done this sooner.    IMPRESSION:   1. Obstructive Sleep Apnea (OSA) 2. Dysfunctions associated with sleep stages or arousal from sleep   RECOMMENDATIONS:   1. This study demonstrates resolution of the patient's obstructive sleep apnea with CPAP therapy. I will, therefore, start the patient on home CPAP treatment at a pressure of 14  cm via small full face mask, Dreamwear, with (heated) humidity. The patient will be encouraged to be fully compliant with PAP therapy to improve sleep related symptoms and decrease long term cardiovascular risks. The patient should be reminded, that it may take up to 3 months to get fully used to using PAP with all planned sleep. The earlier full compliance is achieved, the better long term compliance tends to be. Please note that untreated obstructive sleep apnea may carry additional perioperative morbidity. Patients with significant obstructive sleep apnea should receive perioperative PAP therapy and the surgeons and particularly the anesthesiologist should be informed of the diagnosis and the severity of the sleep disordered breathing. 2. This study shows sleep fragmentation and abnormal sleep stage percentages; these are nonspecific findings and per se do not signify an intrinsic sleep disorder or a cause for the patient's sleep-related symptoms. Causes include REM rebound, and slow wave sleep rebound, circadian rhythm disturbances, medication effect or an underlying mood disorder or medical problem.  3. The patient should be cautioned not to drive, work at heights, or operate dangerous or heavy equipment when tired or sleepy. Review and reiteration of good sleep hygiene measures should be pursued with any patient. 4. The patient will be seen in follow-up in the sleep clinic at [facility] for discussion of the test results, symptom and treatment compliance review, further management strategies, etc. The referring provider will be notified of the test results.   I certify that I have reviewed the entire raw data recording prior to the issuance of this report in accordance with the Standards of Accreditation of the American Academy of Sleep Medicine (AASM)     Star Age, MD, PhD Diplomat, American Board of Neurology and Sleep Medicine (Neurology and Sleep Medicine)

## 2020-08-29 ENCOUNTER — Telehealth: Payer: Self-pay

## 2020-08-29 NOTE — Telephone Encounter (Signed)
Pt returned my call.  I advised pt that Dr. Rexene Alberts reviewed their sleep study results and found that pt did well during most recent sleep study with cpap. Dr. Rexene Alberts recommends that pt start cpap treatment at home. I reviewed PAP compliance expectations with the pt. Pt is agreeable to starting a CPAP. I advised pt that an order will be sent to a DME, [facility], and [facility] will call the pt within about one week after they file with the pt's insurance. [facility] will show the pt how to use the machine, fit for masks, and troubleshoot the CPAP if needed. A follow up appt was made for insurance purposes with Dr. Rexene Alberts on 10/23/2020 at 830 am . Pt verbalized understanding to arrive 15 minutes early and bring their CPAP. A letter with all of this information in it will be mailed to the pt as a reminder. I verified with the pt that the address we have on file is correct. Pt verbalized understanding of results. Pt had no questions at this time but was encouraged to call back if questions arise. I have sent the order to [facility] and have received confirmation that they have received the order.

## 2020-08-29 NOTE — Telephone Encounter (Signed)
-----   Message from Star Age, MD sent at 08/25/2020 11:16 AM EDT ----- Patient referred by Hailey Noble with [facility]WM, seen by me on 07/04/20, diagnostic PSG on 07/20/20. Patient had a CPAP titration study on 08/14/20.  Please call and inform patient that I have entered an order for treatment with positive airway pressure (PAP) treatment for obstructive sleep apnea (OSA). She did well during the latest sleep study with CPAP. We will, therefore, arrange for a machine for home use through a DME (durable medical equipment) company of Her choice; and I will see the patient back in follow-up in about 10 weeks. Please also explain to the patient that I will be looking out for compliance data, which can be downloaded from the machine (stored on an SD card, that is inserted in the machine) or via remote access through a modem, that is built into the machine. At the time of the followup appointment we will discuss sleep study results and how it is going with PAP treatment at home. Please advise patient to bring Her machine at the time of the first FU visit, even though this is cumbersome. Bringing the machine for every visit after that will likely not be needed, but often helps for the first visit to troubleshoot if needed. Please re-enforce the importance of compliance with treatment and the need for [name] to monitor compliance data - often an insurance requirement and actually good feedback for the patient as far as how they are doing.  Also remind patient, that any interim PAP machine or mask issues should be first addressed with the DME company, as they can often help better with technical and mask fit issues. Please ask if patient has a preference regarding DME company.  Please also make sure, the patient has a follow-up appointment with me in about 10 weeks from the setup date, thanks. May see one of our nurse practitioners if needed for proper timing of the FU appointment.  Please fax or rout report to the referring  provider. Thanks,   Star Age, MD, PhD [facility])

## 2020-08-29 NOTE — Telephone Encounter (Signed)
I called pt. No answer, left a message asking pt to call me back.   

## 2020-08-31 MED FILL — SAXENDA 18 MG/3 ML PEN: 18 | 30 days supply | Qty: 15 | Fill #0

## 2020-08-31 MED FILL — TECHLITE PEN NDL 32GX1/4: 32G X 6 MM | 90 days supply | Qty: 100 | Fill #0

## 2020-09-01 ENCOUNTER — Encounter: Payer: 59 | Attending: Family Medicine | Admitting: Registered"

## 2020-09-01 ENCOUNTER — Telehealth: Payer: 59 | Admitting: Family Medicine

## 2020-09-01 DIAGNOSIS — R7303 Prediabetes: Secondary | ICD-10-CM | POA: Insufficient documentation

## 2020-09-03 ENCOUNTER — Encounter: Payer: Self-pay | Admitting: Registered"

## 2020-09-03 NOTE — Progress Notes (Signed)
On 09/01/20 patient completed Core Session 4 of Diabetes Prevention Program course virtually with [facility]. The following learning objectives were met by the patient during this class:   Learning Objectives:  Describe the MyPlate food guide and its recommendations, including how to reduce fat and calories in our diet.  Compare and contrast MyPlate guidelines with participants' eating habits.  List ways to replace high-fat and high-calorie foods with low-fat and low-calorie foods.  Explain the importance of eating plenty of whole grains, vegetables, and fruits, while staying within fat gram goals.  Explain the importance of eating foods from all groups of MyPlate and of eating a variety of foods from within each group.  Explain why a balanced diet is beneficial to health.  Goals:   Record weight taken outside of class.   Track foods and beverages eaten each day in the "Food and Activity Tracker," including calories and fat grams for each item.   Practice comparing what you eat with the recommendations of MyPlate using the "Rate Your Plate" handout.   Complete the "Rate Your Plate" handout form on at least 3 days.   Answer homework questions.   Follow-Up Plan:  Attend Core Session 5 next week.   Email completed "Food and Activity Tracker" next week to be reviewed by Lifestyle Coach.   

## 2020-09-05 ENCOUNTER — Ambulatory Visit: Payer: 59 | Admitting: Family Medicine

## 2020-09-06 ENCOUNTER — Encounter: Payer: Self-pay | Admitting: Family Medicine

## 2020-09-06 ENCOUNTER — Ambulatory Visit: Payer: 59 | Admitting: Family Medicine

## 2020-09-06 ENCOUNTER — Other Ambulatory Visit: Payer: Self-pay

## 2020-09-06 VITALS — BP 135/75 | HR 92 | Ht 65.0 in

## 2020-09-06 DIAGNOSIS — M23252 Derangement of posterior horn of lateral meniscus due to old tear or injury, left knee: Secondary | ICD-10-CM | POA: Diagnosis not present

## 2020-09-06 NOTE — Progress Notes (Signed)
RETTIE LAIRD - 54 y.o. female MRN [id number]  Date of birth: [birth year]  SUBJECTIVE:  Including CC & ROS.  Chief Complaint  Patient presents with  . Follow-up    left knee    MARRISA KIMBER is a 54 y.o. female that is presenting with ongoing symptoms of her left knee.  She has been seen a few different times for this knee pain where she feels a pop and instability of the knee.  This is felt posteriorly and medially.  Has mechanical symptoms intermittently.   Review of Systems See HPI   HISTORY: Past Medical, Surgical, Social, and Family History Reviewed & Updated per EMR.   Pertinent Historical Findings include:  Past Medical History:  Diagnosis Date  . Allergic state 12/08/2016  . Allergy   . Anxiety   . Back pain    " i just have a weak loer back"   . Fibroids   . GERD (gastroesophageal reflux disease)   . Hyperlipidemia 06/09/2017  . Insulin resistance   . Joint pain   . Knee pain, right 03/10/2017  . Plantar fasciitis   . PONV (postoperative nausea and vomiting)    just nausea   . Shortness of breath    not only if  i exert myselg   . Sinusitis   . Vitamin D deficiency 11/22/2016  . Vitamin D deficiency     Past Surgical History:  Procedure Laterality Date  . ABDOMINAL HYST[place]CTOMY  09/2019   [place]  . COLONOSCOPY  02-2009   with Henrene Pastor normal  . CYSTOSCOPY N/A 10/12/2019   Procedure: CYSTOSCOPY;  Surgeon: Sherlyn Hay, DO;  Location: [place];  Service: Gynecology;  Laterality: N/A;  . ESOPHAGOGASTRODUODENOSCOPY  2012  . GANGLION CYST EXCISION Right    right- wrist   . NASAL SEPTUM SURGERY    . NASAL SINUS SURGERY    . TMJ ARTHROSCOPY     left  . TOTAL LAPAROSCOPIC HYSTERECTOMY WITH BILATERAL SALPINGO OOPHORECTOMY Bilateral 10/12/2019   Procedure: TOTAL LAPAROSCOPIC HYSTERECTOMY WITH BILATERAL SALPINGO OOPHORECTOMY, Lysis Pelvic Adhseions ;  Surgeon: Sherlyn Hay, DO;  Location: [place];  Service:  Gynecology;  Laterality: Bilateral;    Family History  Problem Relation Age of Onset  . Hypertension Father   . Diabetes Father   . Hyperlipidemia Father   . Breast cancer Mother 56  . Cancer Mother        breast  . Thyroid disease Mother   . Sleep apnea Mother   . Colon cancer Paternal Grandfather        died at age 17  . Cancer Paternal Grandfather        GI CA  . Obesity Sister   . Diabetes Sister   . Hypertension Sister   . Heart disease Maternal Grandmother        MI  . Cancer Maternal Grandfather        lung, smoker  . Diabetes Maternal Grandfather   . Diabetes Paternal Grandmother   . Heart disease Paternal Grandmother   . Pancreatitis Sister   . Esophageal cancer Neg Hx   . Stomach cancer Neg Hx   . Stroke Neg Hx   . Colon polyps Neg Hx   . Rectal cancer Neg Hx   . Pancreatic cancer Neg Hx     Social History   Socioeconomic History  . Marital status: Single    Spouse name: Not on file  . Number of children:  Not on file  . Years of education: Not on file  . Highest education level: Not on file  Occupational History  . Occupation: Programmer, multimedia: [employer]  Tobacco Use  . Smoking status: Never Smoker  . Smokeless tobacco: Never Used  Vaping Use  . Vaping Use: Never used  Substance and Sexual Activity  . Alcohol use: Yes    Alcohol/week: 0.0 standard drinks    Comment: rare  . Drug use: No  . Sexual activity: Yes    Birth control/protection: I.U.D.  Other Topics Concern  . 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 Works at [employer]. No dietary restrictions. Lives with dog   Social Determinants of Health   Financial Resource Strain:   . Difficulty of Paying Living Expenses: Not on file  Food Insecurity:   . Worried About Charity fundraiser in the Last Year: Not on file  . Ran Out of Food in the Last Year: Not on file  Transportation Needs:   . Lack of  Transportation (Medical): Not on file  . Lack of Transportation (Non-Medical): Not on file  Physical Activity:   . Days of Exercise per Week: Not on file  . Minutes of Exercise per Session: Not on file  Stress:   . Feeling of Stress : Not on file  Social Connections:   . Frequency of Communication with Friends and Family: Not on file  . Frequency of Social Gatherings with Friends and Family: Not on file  . Attends Religious Services: Not on file  . Active Member of Clubs or Organizations: Not on file  . Attends Archivist Meetings: Not on file  . Marital Status: Not on file  Intimate Partner Violence:   . Fear of Current or Ex-Partner: Not on file  . Emotionally Abused: Not on file  . Physically Abused: Not on file  . 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  Seems more posteriorly issue with the popping that she initially experienced and having some questions about the trust that she has in her knee. -Counseled supportive care. -MRI to evaluate for meniscus tear.

## 2020-09-06 NOTE — Assessment & Plan Note (Signed)
Symptoms have been ongoing for a few months now.  Seems more posteriorly issue with the popping that she initially experienced and having some questions about the trust that she has in her knee. -Counseled supportive care. -MRI to evaluate for meniscus tear.

## 2020-09-08 ENCOUNTER — Encounter: Payer: 59 | Admitting: Registered"

## 2020-09-09 ENCOUNTER — Other Ambulatory Visit: Payer: Self-pay

## 2020-09-09 ENCOUNTER — Ambulatory Visit (HOSPITAL_BASED_OUTPATIENT_CLINIC_OR_DEPARTMENT_OTHER)
Admission: RE | Admit: 2020-09-09 | Discharge: 2020-09-09 | Disposition: A | Discharge status: Home / Self Care | Payer: 59 | Source: Ambulatory Visit | Attending: Family Medicine | Admitting: Family Medicine

## 2020-09-09 DIAGNOSIS — M23252 Derangement of posterior horn of lateral meniscus due to old tear or injury, left knee: Secondary | ICD-10-CM | POA: Diagnosis not present

## 2020-09-09 DIAGNOSIS — M25562 Pain in left knee: Secondary | ICD-10-CM | POA: Diagnosis not present

## 2020-09-11 ENCOUNTER — Encounter (INDEPENDENT_AMBULATORY_CARE_PROVIDER_SITE_OTHER): Payer: Self-pay | Admitting: Physician Assistant

## 2020-09-11 ENCOUNTER — Telehealth: Payer: Self-pay | Admitting: Family Medicine

## 2020-09-11 ENCOUNTER — Ambulatory Visit (INDEPENDENT_AMBULATORY_CARE_PROVIDER_SITE_OTHER): Payer: 59 | Admitting: Physician Assistant

## 2020-09-11 ENCOUNTER — Other Ambulatory Visit: Payer: Self-pay

## 2020-09-11 VITALS — BP 130/78 | HR 85 | Temp 97.4°F | Ht 65.0 in | Wt 211.0 lb

## 2020-09-11 DIAGNOSIS — Z9189 Other specified personal risk factors, not elsewhere classified: Secondary | ICD-10-CM | POA: Diagnosis not present

## 2020-09-11 DIAGNOSIS — Z6835 Body mass index (BMI) 35.0-35.9, adult: Secondary | ICD-10-CM | POA: Diagnosis not present

## 2020-09-11 DIAGNOSIS — F3289 Other specified depressive episodes: Secondary | ICD-10-CM | POA: Diagnosis not present

## 2020-09-11 DIAGNOSIS — R7303 Prediabetes: Secondary | ICD-10-CM

## 2020-09-11 NOTE — Telephone Encounter (Signed)
Called pt to schedule MRI review Virtual [facility]--left msg for pt to call office.  --glh

## 2020-09-12 MED ORDER — METFORMIN HCL 500 MG PO TABS: 500.0000 mg | ORAL_TABLET | Freq: Every day | ORAL | 0 refills | Status: DC

## 2020-09-12 MED FILL — METFORMIN HCL 500 MG TABS: 500 | 90 days supply | Qty: 90 | Fill #0

## 2020-09-12 NOTE — Progress Notes (Signed)
Chief Complaint:   Hailey Noble is here to discuss her progress with her obesity treatment plan along with follow-up of her obesity related diagnoses. Hailey Noble is on the Category 2 Plan and states she is following her eating plan approximately 90% of the time. Hailey Noble states she is walking/swimming 30-45 minutes 3-4 times per week.  Today's visit was #: 53 Starting weight: 212 lbs Starting date: 10/06/2017 Today's weight: 211 lbs Today's date: 09/11/2020 Total lbs lost to date: 1 Total lbs lost since last in-office visit: 1  Interim History: Hailey Noble states that 0.6 mg Saxenda is not yet helping control her hunger. She is getting nutritional advice from a [facility] nutritionist. She states her CPAP has not been delivered as of yet.  Subjective:   Prediabetes. Hailey Noble has a diagnosis of prediabetes based on her elevated HgA1c and was informed this puts her at greater risk of developing diabetes. She continues to work on diet and exercise to decrease her risk of diabetes. She denies nausea or hypoglycemia. Hailey Noble is on metformin, which she is tolerating well. She is due for labs.  Lab Results  Component Value Date   HGBA1C 5.4 12/02/2019   Lab Results  Component Value Date   INSULIN 10.1 04/10/2020   INSULIN 9.3 12/30/2018   INSULIN 11.1 09/21/2018   INSULIN 8.7 04/30/2018   INSULIN 8.8 10/06/2017   Other depression, with emotional eating. Hailey Noble is struggling with emotional eating and using food for comfort to the extent that it is negatively impacting her health. She has been working on behavior modification techniques to help reduce her emotional eating and has been somewhat successful. She shows no sign of suicidal or homicidal ideations. Hailey Noble reports feeling bad after eating something she is "not supposed to." She also feels guilty if she feels physically bad after eating some things.  At risk for diabetes mellitus. Hailey Noble is at higher than average risk for developing  diabetes due to her obesity.   Assessment/Plan:   Prediabetes. Hailey Noble will continue to work on weight loss, exercise, and decreasing simple carbohydrates to help decrease the risk of diabetes. Refill was given for metFORMIN (GLUCOPHAGE) 500 MG tablet #90 with 0 refills.  Other depression, with emotional eating. Behavior modification techniques were discussed today to help Hailey Noble deal with her emotional/non-hunger eating behaviors.  Orders and follow up as documented in patient record. She will be referred to Dr. Mallie Mussel, our bariatric psychologist, for evaluation.  At risk for diabetes mellitus. Hailey Noble was given approximately 15 minutes of diabetes education and counseling today. We discussed intensive lifestyle modifications today with an emphasis on weight loss as well as increasing exercise and decreasing simple carbohydrates in her diet. We also reviewed medication options with an emphasis on risk versus benefit of those discussed.   Repetitive spaced learning was employed today to elicit superior memory formation and behavioral change.  Class 2 severe obesity with serious comorbidity and body mass index (BMI) of 35.0 to 35.9 in adult, unspecified obesity type ([place]). Hailey Noble will increase Saxenda to 1.2 mg (no prescription).  Hailey Noble is currently in the action stage of change. As such, her goal is to continue with weight loss efforts. She has agreed to the Category 2 Plan.   Labs will be checked at her next visit.  Exercise goals: For substantial health benefits, adults should do at least 150 minutes (2 hours and 30 minutes) a week of moderate-intensity, or 75 minutes (1 hour and 15 minutes) a week of vigorous-intensity  aerobic physical activity, or an equivalent combination of moderate- and vigorous-intensity aerobic activity. Aerobic activity should be performed in episodes of at least 10 minutes, and preferably, it should be spread throughout the week.  Behavioral modification strategies:  emotional eating strategies and planning for success.  Hailey Noble has agreed to follow-up with our clinic in 4 weeks. She was informed of the importance of frequent follow-up visits to maximize her success with intensive lifestyle modifications for her multiple health conditions.   Objective:   Blood pressure 130/78, pulse 85, temperature (!) 97.4 F (36.3 C), height 5\' 5"  (1.651 m), weight 211 lb (95.7 kg), last menstrual period 11/22/2018, SpO2 96 %. Body mass index is 35.11 kg/m.  General: Cooperative, alert, well developed, in no acute distress. HEENT: Conjunctivae and lids unremarkable. Cardiovascular: Regular rhythm.  Lungs: Normal work of breathing. Neurologic: No focal deficits.   Lab Results  Component Value Date   CREATININE 0.55 06/01/2020   BUN 23 06/01/2020   NA 137 06/01/2020   K 4.4 06/01/2020   CL 104 06/01/2020   CO2 29 06/01/2020   Lab Results  Component Value Date   ALT 26 06/01/2020   AST 20 06/01/2020   ALKPHOS 138 (H) 06/01/2020   BILITOT 0.4 06/01/2020   Lab Results  Component Value Date   HGBA1C 5.4 12/02/2019   HGBA1C 5.3 10/08/2019   HGBA1C 5.4 06/16/2019   HGBA1C 5.4 12/30/2018   HGBA1C 5.4 09/21/2018   Lab Results  Component Value Date   INSULIN 10.1 04/10/2020   INSULIN 9.3 12/30/2018   INSULIN 11.1 09/21/2018   INSULIN 8.7 04/30/2018   INSULIN 8.8 10/06/2017   Lab Results  Component Value Date   TSH 1.47 06/01/2020   Lab Results  Component Value Date   CHOL 180 06/01/2020   HDL 47.80 06/01/2020   LDLCALC 95 06/01/2020   TRIG 185.0 (H) 06/01/2020   CHOLHDL 4 06/01/2020   Lab Results  Component Value Date   WBC 7.7 06/01/2020   HGB 14.0 06/01/2020   HCT 41.7 06/01/2020   MCV 89.0 06/01/2020   PLT 349.0 06/01/2020   Lab Results  Component Value Date   IRON 94 06/01/2020   TIBC 363 06/01/2020   FERRITIN 36 06/01/2020   Attestation Statements:   Reviewed by clinician on day of visit: allergies, medications, problem  list, medical history, surgical history, family history, social history, and previous encounter notes.  IMichaelene Song, am acting as transcriptionist for Abby Potash, PA-C   I have reviewed the above documentation for accuracy and completeness, and I agree with the above. Abby Potash, PA-C

## 2020-09-13 ENCOUNTER — Telehealth (INDEPENDENT_AMBULATORY_CARE_PROVIDER_SITE_OTHER): Payer: 59 | Admitting: Family Medicine

## 2020-09-13 ENCOUNTER — Other Ambulatory Visit: Payer: Self-pay

## 2020-09-13 DIAGNOSIS — M23252 Derangement of posterior horn of lateral meniscus due to old tear or injury, left knee: Secondary | ICD-10-CM | POA: Diagnosis not present

## 2020-09-13 NOTE — Assessment & Plan Note (Signed)
Have a tear observed on MRI.  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 -Counseled on home exercise therapy and supportive care. -Can try a steroid injection  -Could consider physical therapy.Marland Kitchen

## 2020-09-13 NOTE — Progress Notes (Signed)
Virtual Visit via Telephone Note  I connected with Hebert Soho on 09/13/20 at  8:10 AM EDT by telephone and verified that I am speaking with the correct person using two identifiers.   I discussed the limitations, risks, security and privacy concerns of performing an evaluation and management service by telephone and the availability of in person appointments. I also discussed with the patient that there may be a patient responsible charge related to this service. The patient expressed understanding and agreed to proceed.  Patient: work  Physician: office  History of Present Illness:   Ms. Citro is a 54 year old female that is following up after the MRI of her left knee.  This is showing a radial tear of the posterior medial meniscus as well as a chondral defect.  Observations/Objective:   Assessment and Plan:  Tear of the posterior horn of the medial meniscus: Have a tear observed on MRI.  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 -Counseled on home exercise therapy and supportive care. -Can try a steroid injection  -Could consider physical therapy..  Follow Up Instructions:    I discussed the assessment and treatment plan with the patient. The patient was provided an opportunity to ask questions and all were answered. The patient agreed with the plan and demonstrated an understanding of the instructions.   The patient was advised to call back or seek an in-person evaluation if the symptoms worsen or if the condition fails to improve as anticipated.  I provided 5 minutes of non-face-to-face time during this encounter.   Clearance Coots, MD

## 2020-09-15 ENCOUNTER — Encounter: Payer: 59 | Admitting: Registered"

## 2020-09-18 ENCOUNTER — Ambulatory Visit: Payer: 59 | Admitting: Family Medicine

## 2020-09-18 ENCOUNTER — Other Ambulatory Visit: Payer: Self-pay

## 2020-09-18 ENCOUNTER — Ambulatory Visit: Payer: Self-pay

## 2020-09-18 DIAGNOSIS — M23252 Derangement of posterior horn of lateral meniscus due to old tear or injury, left knee: Secondary | ICD-10-CM

## 2020-09-18 MED ORDER — TRIAMCINOLONE ACETONIDE 40 MG/ML IJ SUSP
40.0000 mg | Freq: Once | INTRAMUSCULAR | Status: AC
  Administered 2020-09-18: 40 mg via INTRA_ARTICULAR

## 2020-09-18 NOTE — Assessment & Plan Note (Signed)
No significant effusion on exam today.  Symptoms have been mild in nature. -Counseled on supportive care. -Injection. -Could consider gel injection.

## 2020-09-18 NOTE — Progress Notes (Signed)
Hailey Noble - 54 y.o. female MRN [id number]  Date of birth: [birth year]  SUBJECTIVE:  Including CC & ROS.  Chief Complaint  Patient presents with  . Follow-up    left knee    Hailey Noble is a 54 y.o. female that is presenting for an injection.   Review of Systems See HPI   HISTORY: Past Medical, Surgical, Social, and Family History Reviewed & Updated per EMR.   Pertinent Historical Findings include:  Past Medical History:  Diagnosis Date  . Allergic state 12/08/2016  . Allergy   . Anxiety   . Back pain    " i just have a weak loer back"   . Fibroids   . GERD (gastroesophageal reflux disease)   . Hyperlipidemia 06/09/2017  . Insulin resistance   . Joint pain   . Knee pain, right 03/10/2017  . Plantar fasciitis   . PONV (postoperative nausea and vomiting)    just nausea   . Shortness of breath    not only if  i exert myselg   . Sinusitis   . Vitamin D deficiency 11/22/2016  . Vitamin D deficiency     Past Surgical History:  Procedure Laterality Date  . ABDOMINAL HYST[place]CTOMY  09/2019   [place]  . COLONOSCOPY  02-2009   with Henrene Pastor normal  . CYSTOSCOPY N/A 10/12/2019   Procedure: CYSTOSCOPY;  Surgeon: Sherlyn Hay, DO;  Location: [place];  Service: Gynecology;  Laterality: N/A;  . ESOPHAGOGASTRODUODENOSCOPY  2012  . GANGLION CYST EXCISION Right    right- wrist   . NASAL SEPTUM SURGERY    . NASAL SINUS SURGERY    . TMJ ARTHROSCOPY     left  . TOTAL LAPAROSCOPIC HYSTERECTOMY WITH BILATERAL SALPINGO OOPHORECTOMY Bilateral 10/12/2019   Procedure: TOTAL LAPAROSCOPIC HYSTERECTOMY WITH BILATERAL SALPINGO OOPHORECTOMY, Lysis Pelvic Adhseions ;  Surgeon: Sherlyn Hay, DO;  Location: [facility] [place];  Service: Gynecology;  Laterality: Bilateral;    Family History  Problem Relation Age of Onset  . Hypertension Father   . Diabetes Father   . Hyperlipidemia Father   . Breast cancer Mother 69  . Cancer Mother         breast  . Thyroid disease Mother   . Sleep apnea Mother   . Colon cancer Paternal Grandfather        died at age 41  . Cancer Paternal Grandfather        GI CA  . Obesity Sister   . Diabetes Sister   . Hypertension Sister   . Heart disease Maternal Grandmother        MI  . Cancer Maternal Grandfather        lung, smoker  . Diabetes Maternal Grandfather   . Diabetes Paternal Grandmother   . Heart disease Paternal Grandmother   . Pancreatitis Sister   . Esophageal cancer Neg Hx   . Stomach cancer Neg Hx   . Stroke Neg Hx   . Colon polyps Neg Hx   . Rectal cancer Neg Hx   . Pancreatic cancer Neg Hx     Social History   Socioeconomic History  . Marital status: Single    Spouse name: Not on file  . Number of children: Not on file  . Years of education: Not on file  . Highest education level: Not on file  Occupational History  . Occupation: Programmer, multimedia: [employer]  Tobacco Use  . Smoking status:  Never Smoker  . Smokeless tobacco: Never Used  Vaping Use  . Vaping Use: Never used  Substance and Sexual Activity  . Alcohol use: Yes    Alcohol/week: 0.0 standard drinks    Comment: rare  . Drug use: No  . Sexual activity: Yes    Birth control/protection: I.U.D.  Other Topics Concern  . 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 Works at [employer]. No dietary restrictions. Lives with dog   Social Determinants of Health   Financial Resource Strain:   . Difficulty of Paying Living Expenses: Not on file  Food Insecurity:   . Worried About Charity fundraiser in the Last Year: Not on file  . Ran Out of Food in the Last Year: Not on file  Transportation Needs:   . Lack of Transportation (Medical): Not on file  . Lack of Transportation (Non-Medical): Not on file  Physical Activity:   . Days of Exercise per Week: Not on file  . Minutes of Exercise per Session: Not on file  Stress:   .  Feeling of Stress : Not on file  Social Connections:   . Frequency of Communication with Friends and Family: Not on file  . Frequency of Social Gatherings with Friends and Family: Not on file  . Attends Religious Services: Not on file  . Active Member of Clubs or Organizations: Not on file  . Attends Archivist Meetings: Not on file  . Marital Status: Not on file  Intimate Partner Violence:   . Fear of Current or Ex-Partner: Not on file  . Emotionally Abused: Not on file  . Physically Abused: Not on file  . Sexually Abused: Not on file     PHYSICAL EXAM:  VS: LMP 11/22/2018 (Approximate)  Physical Exam Gen: NAD, alert, cooperative with exam, well-appearing     Aspiration/Injection Procedure Note Hailey Noble 1966-08-27  Procedure: Injection Indications: left knee pain   Procedure Details Consent: Risks of procedure as well as the alternatives and risks of each were explained to the (patient/caregiver).  Consent for procedure obtained. Time Out: Verified patient identification, verified procedure, site/side was marked, verified correct patient position, special equipment/implants available, medications/allergies/relevent history reviewed, required imaging and test results available.  Performed.  The area was cleaned with iodine and alcohol swabs.    The left knee superior lateral suprapatellar pouch was injected using 1 cc's of 40 mg Kenalog and 4 cc's of 0.25% bupivacaine with a 22 1 1/2" needle.  Ultrasound was used. Images were obtained in long views showing the injection.     A sterile dressing was applied.  Patient did tolerate procedure well.     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  Symptoms have been mild in nature. -Counseled on supportive care. -Injection. -Could consider gel injection.

## 2020-09-18 NOTE — Patient Instructions (Signed)
Good to see you ?Please use ice as needed  ?Please try the exercises   ?Please send me a message in MyChart with any questions or updates.  ?Please see me back in 4-6 weeks.  ? ?--Dr. Priyah Schmuck ? ?

## 2020-09-20 ENCOUNTER — Telehealth: Payer: Self-pay | Admitting: Neurology

## 2020-09-20 NOTE — Telephone Encounter (Signed)
pt asking if there is another DME other than [facility] since they can not fit her before the end of Nov

## 2020-09-20 NOTE — Telephone Encounter (Signed)
I have reached out to [facility] about this. They are going to call the pt about a sooner appt.

## 2020-09-21 NOTE — Telephone Encounter (Signed)
[facility] has moved the pt's start date up to 09/27/2020. Nothing further needed at this time.

## 2020-09-22 ENCOUNTER — Encounter: Payer: Self-pay | Admitting: Registered"

## 2020-09-22 ENCOUNTER — Encounter: Payer: 59 | Attending: Family Medicine | Admitting: Registered"

## 2020-09-22 DIAGNOSIS — Z713 Dietary counseling and surveillance: Secondary | ICD-10-CM | POA: Insufficient documentation

## 2020-09-22 DIAGNOSIS — R7303 Prediabetes: Secondary | ICD-10-CM | POA: Insufficient documentation

## 2020-09-22 NOTE — Progress Notes (Signed)
On 09/22/20 patient completed Core Session 7 of Diabetes Prevention Program course virtually with [facility]. The following learning objectives were met by the patient during this class:   I connected with Hailey Noble by a video enabled application and verified that I am speaking with the correct person.  Location: Patient: [place] surgeon: [facility] Office   Learning Objectives:  Define calorie balance.  Explain how healthy eating and being active are related in terms of calorie balance.   Describe the relationship between calorie balance and weight loss.   Describe his or her progress as it relates to calorie balance.   Develop an activity plan for the coming week.   Goals:   Record weight taken outside of class.   Track foods and beverages eaten each day in the "Food and Activity Tracker," including calories and fat grams for each item.    Track activity type, minutes you were active, and distance you reached each day in the "Food and Activity Tracker."   Set aside one 20 to 30-minute block of time every day or find two or more periods of 10 to15 minutes each for physical activity.   Make a Physical Activities Plan for the Week.   Make active lifestyle choices all through the day   Stay at or go slightly over activity goal.   Follow-Up Plan:  Attend Core Session 8 next week.   E-mail completed "Food and Activity Tracker" to Lifestyle Coach next week before class

## 2020-09-27 DIAGNOSIS — G4733 Obstructive sleep apnea (adult) (pediatric): Secondary | ICD-10-CM | POA: Diagnosis not present

## 2020-09-29 ENCOUNTER — Encounter: Payer: 59 | Admitting: Registered"

## 2020-09-29 DIAGNOSIS — Z713 Dietary counseling and surveillance: Secondary | ICD-10-CM

## 2020-10-05 ENCOUNTER — Encounter: Payer: Self-pay | Admitting: Registered"

## 2020-10-05 NOTE — Progress Notes (Signed)
On 09/29/20 patient completed Core Session 8 of Diabetes Prevention Program course virtually with [facility]. The following learning objectives were met by the patient during this class:   I connected with Hailey Noble by a video enabled application and verified that I am speaking with the correct person using two identifiers.  Location: Patient: Office.  Provider: [facility].   Learning Objectives:  Recognize positive and negative food and activity cues.   Change negative food and activity cues to positive cues.   Add positive cues for activity and eliminate cues for inactivity.   Develop a plan for removing one problem food cue for the coming week.   Goals:   Record weight taken outside of class.   Track foods and beverages eaten each day in the "Food and Activity Tracker," including calories and fat grams for each item.    Track activity type, minutes you were active, and distance you reached each day in the "Food and Activity Tracker."   Set aside one 20 to 30-minute block of time every day or find two or more periods of 10 to15 minutes each for physical activity.   Remove one problem food cue.   Add one positive cue for being more active.  Follow-Up Plan: . Attend Core Session 9 next week.  . Email completed "Food and Activity Tracker" next week to be reviewed by Lifestyle Coach.

## 2020-10-12 ENCOUNTER — Ambulatory Visit (INDEPENDENT_AMBULATORY_CARE_PROVIDER_SITE_OTHER): Payer: 59 | Admitting: Physician Assistant

## 2020-10-12 ENCOUNTER — Other Ambulatory Visit: Payer: Self-pay

## 2020-10-12 ENCOUNTER — Encounter (INDEPENDENT_AMBULATORY_CARE_PROVIDER_SITE_OTHER): Payer: Self-pay | Admitting: Physician Assistant

## 2020-10-12 VITALS — BP 122/77 | HR 79 | Temp 97.8°F | Ht 65.0 in | Wt 211.0 lb

## 2020-10-12 DIAGNOSIS — E559 Vitamin D deficiency, unspecified: Secondary | ICD-10-CM | POA: Diagnosis not present

## 2020-10-12 DIAGNOSIS — R7303 Prediabetes: Secondary | ICD-10-CM

## 2020-10-12 DIAGNOSIS — E7849 Other hyperlipidemia: Secondary | ICD-10-CM | POA: Diagnosis not present

## 2020-10-12 DIAGNOSIS — Z9189 Other specified personal risk factors, not elsewhere classified: Secondary | ICD-10-CM

## 2020-10-12 DIAGNOSIS — Z6835 Body mass index (BMI) 35.0-35.9, adult: Secondary | ICD-10-CM

## 2020-10-13 LAB — LIPID PANEL
Chol/HDL Ratio: 4 ratio (ref 0.0–4.4)
Cholesterol, Total: 197 mg/dL (ref 100–199)
HDL: 49 mg/dL (ref >39)
LDL Chol Calc (NIH): 128 mg/dL — ABNORMAL HIGH (ref 0–99)
Triglycerides: 113 mg/dL (ref 0–149)
VLDL Cholesterol Cal: 20 mg/dL (ref 5–40)

## 2020-10-13 LAB — COMPREHENSIVE METABOLIC PANEL
ALT: 19 IU/L (ref 0–32)
AST: 15 IU/L (ref 0–40)
Albumin/Globulin Ratio: 2 (ref 1.2–2.2)
Albumin: 4.5 g/dL (ref 3.8–4.9)
Alkaline Phosphatase: 144 IU/L — ABNORMAL HIGH (ref 44–121)
BUN/Creatinine Ratio: 27 — ABNORMAL HIGH (ref 9–23)
BUN: 17 mg/dL (ref 6–24)
Bilirubin Total: 0.3 mg/dL (ref 0.0–1.2)
CO2: 26 mmol/L (ref 20–29)
Calcium: 9.4 mg/dL (ref 8.7–10.2)
Chloride: 103 mmol/L (ref 96–106)
Creatinine, Ser: 0.63 mg/dL (ref 0.57–1.00)
GFR calc Af Amer: 117 mL/min/{1.73_m2} (ref >59)
GFR calc non Af Amer: 101 mL/min/{1.73_m2} (ref >59)
Globulin, Total: 2.3 g/dL (ref 1.5–4.5)
Glucose: 82 mg/dL (ref 65–99)
Potassium: 4.5 mmol/L (ref 3.5–5.2)
Sodium: 141 mmol/L (ref 134–144)
Total Protein: 6.8 g/dL (ref 6.0–8.5)

## 2020-10-13 LAB — VITAMIN D 25 HYDROXY (VIT D DEFICIENCY, FRACTURES): Vit D, 25-Hydroxy: 25.8 ng/mL — ABNORMAL LOW (ref 30.0–100.0)

## 2020-10-13 LAB — HEMOGLOBIN A1C
Est. average glucose Bld gHb Est-mCnc: 111 mg/dL
Hgb A1c MFr Bld: 5.5 % (ref 4.8–5.6)

## 2020-10-13 LAB — INSULIN, RANDOM: INSULIN: 10.5 u[IU]/mL (ref 2.6–24.9)

## 2020-10-16 NOTE — Progress Notes (Signed)
Chief Complaint:   Hailey Noble is here to discuss her progress with her obesity treatment plan along with follow-up of her obesity related diagnoses. Hailey Noble is on the Category 3 Plan and states she is following her eating plan approximately 90% of the time. Hailey Noble states she is biking 30 minutes 3 times per week.  Today's visit was #: 53 Starting weight: 212 lbs Starting date: 10/06/2017 Today's weight: 211 lbs Today's date: 10/12/2020 Total lbs lost to date: 1 Total lbs lost since last in-office visit: 0  Interim History: Hailey Noble has been using her CPAP for 2 weeks and feels so much more rested and has more energy. She is currently on Saxenda 1.8 mg. She reports that she continues to have sugar cravings. She reports getting between 1300 and 1400 calories daily. She is traveling to the beach this week.  Subjective:   Prediabetes. Hailey Noble has a diagnosis of prediabetes based on her elevated HgA1c and was informed this puts her at greater risk of developing diabetes. She continues to work on diet and exercise to decrease her risk of diabetes. Hailey Noble is on metformin. No nausea, vomiting, or diarrhea. She reports polyphagia. She is due for labs.  Lab Results  Component Value Date   INSULIN 10.1 04/10/2020   INSULIN 9.3 12/30/2018   INSULIN 11.1 09/21/2018   INSULIN 8.7 04/30/2018   Vitamin D deficiency. Hailey Noble is on Vitamin D supplementation daily, which she is tolerating well. Last level was not at goal.   Ref. Range 04/10/2020 12:46  Vitamin D, 25-Hydroxy Latest Ref Range: 30.0 - 100.0 ng/mL 40.2   Other hyperlipidemia. Hailey Noble is on no medication. No chest pain. She is exercising regularly. She is due for labs.  At risk for heart disease. Hailey Noble is at a higher than average risk for cardiovascular disease due to obesity.   Assessment/Plan:   Prediabetes. Hailey Noble will continue to work on weight loss, exercise, and decreasing simple carbohydrates to help decrease the  risk of diabetes. She will continue her medication as directed. Labs will be checked today.  Vitamin D deficiency. Low Vitamin D level contributes to fatigue and are associated with obesity, breast, and colon cancer. She agrees to continue to take Vitamin D 4,000 units daily and VITAMIN D 25 Hydroxy (Vit-D Deficiency, Fractures) level will be checked today.   Other hyperlipidemia. Cardiovascular risk and specific lipid/LDL goals reviewed.  We discussed several lifestyle modifications today and Hailey Noble will continue to work on diet, exercise and weight loss efforts. Orders and follow up as documented in patient record. Labs will be checked today.  Counseling Intensive lifestyle modifications are the first line treatment for this issue. . Dietary changes: Increase soluble fiber. Decrease simple carbohydrates. . Exercise changes: Moderate to vigorous-intensity aerobic activity 150 minutes per week if tolerated.  . Lipid-lowering medications: see documented in medical record.  At risk for heart disease. Hailey Noble was given approximately 15 minutes of coronary artery disease prevention counseling today. She is 54 y.o. female and has risk factors for heart disease including obesity. We discussed intensive lifestyle modifications today with an emphasis on specific weight loss instructions and strategies.   Repetitive spaced learning was employed today to elicit superior memory formation and behavioral change.  Class 2 severe obesity with serious comorbidity and body mass index (BMI) of 35.0 to 35.9 in adult, unspecified obesity type ([place]).  Hailey Noble is currently in the action stage of change. As such, her goal is to continue with weight loss efforts.  She has agreed to the Category 3 Plan and will journal 1500 calories and 95 grams of protein daily.   Exercise goals: For substantial health benefits, adults should do at least 150 minutes (2 hours and 30 minutes) a week of moderate-intensity, or 75 minutes (1  hour and 15 minutes) a week of vigorous-intensity aerobic physical activity, or an equivalent combination of moderate- and vigorous-intensity aerobic activity. Aerobic activity should be performed in episodes of at least 10 minutes, and preferably, it should be spread throughout the week.  Behavioral modification strategies: meal planning and cooking strategies and better snacking choices.  Hailey Noble has agreed to follow-up with our clinic in 3 weeks. She was informed of the importance of frequent follow-up visits to maximize her success with intensive lifestyle modifications for her multiple health conditions.   Hailey Noble was informed we would discuss her lab results at her next visit unless there is a critical issue that needs to be addressed sooner. Hailey Noble agreed to keep her next visit at the agreed upon time to discuss these results.  Objective:   Blood pressure 122/77, pulse 79, temperature 97.8 F (36.6 C), height 5\' 5"  (1.651 m), weight 211 lb (95.7 kg), last menstrual period 11/22/2018, SpO2 96 %. Body mass index is 35.11 kg/m.  General: Cooperative, alert, well developed, in no acute distress. HEENT: Conjunctivae and lids unremarkable. Cardiovascular: Regular rhythm.  Lungs: Normal work of breathing. Neurologic: No focal deficits.   Lab Results  Component Value Date   HGBA1C 5.4 12/02/2019   HGBA1C 5.3 10/08/2019   HGBA1C 5.4 06/16/2019   HGBA1C 5.4 12/30/2018   Lab Results  Component Value Date   INSULIN 10.1 04/10/2020   INSULIN 9.3 12/30/2018   INSULIN 11.1 09/21/2018   INSULIN 8.7 04/30/2018   Lab Results  Component Value Date   TSH 1.47 06/01/2020   Lab Results  Component Value Date   WBC 7.7 06/01/2020   HGB 14.0 06/01/2020   HCT 41.7 06/01/2020   MCV 89.0 06/01/2020   PLT 349.0 06/01/2020   Lab Results  Component Value Date   IRON 94 06/01/2020   TIBC 363 06/01/2020   FERRITIN 36 06/01/2020   Attestation Statements:   Reviewed by clinician on day of  visit: allergies, medications, problem list, medical history, surgical history, family history, social history, and previous encounter notes.  IMichaelene Song, am acting as transcriptionist for Abby Potash, PA-C   I have reviewed the above documentation for accuracy and completeness, and I agree with the above. Abby Potash, PA-C

## 2020-10-18 MED FILL — ESCITALOPRAM 10 MG TABLET: 10 | 90 days supply | Qty: 90 | Fill #2

## 2020-10-20 ENCOUNTER — Encounter: Payer: 59 | Admitting: Registered"

## 2020-10-23 ENCOUNTER — Ambulatory Visit: Payer: 59 | Admitting: Neurology

## 2020-10-23 ENCOUNTER — Other Ambulatory Visit (INDEPENDENT_AMBULATORY_CARE_PROVIDER_SITE_OTHER): Payer: Self-pay | Admitting: Physician Assistant

## 2020-10-23 ENCOUNTER — Other Ambulatory Visit (INDEPENDENT_AMBULATORY_CARE_PROVIDER_SITE_OTHER): Payer: Self-pay

## 2020-10-23 NOTE — Telephone Encounter (Signed)
Patient last saw Hailey Noble

## 2020-10-23 NOTE — Telephone Encounter (Signed)
-------------                                                                      Is it okay to refill this medication request?

## 2020-10-24 ENCOUNTER — Other Ambulatory Visit (INDEPENDENT_AMBULATORY_CARE_PROVIDER_SITE_OTHER): Payer: Self-pay | Admitting: Physician Assistant

## 2020-10-24 ENCOUNTER — Ambulatory Visit: Payer: 59 | Admitting: Neurology

## 2020-10-24 NOTE — Telephone Encounter (Signed)
Gm,  Is it okay to refill this pt's rx request?

## 2020-10-26 ENCOUNTER — Encounter (INDEPENDENT_AMBULATORY_CARE_PROVIDER_SITE_OTHER): Payer: Self-pay

## 2020-10-26 MED FILL — SAXENDA 18 MG/3 ML PEN: 18 | 30 days supply | Qty: 15 | Fill #0

## 2020-10-27 ENCOUNTER — Encounter: Payer: Self-pay | Admitting: Registered"

## 2020-10-27 ENCOUNTER — Other Ambulatory Visit (INDEPENDENT_AMBULATORY_CARE_PROVIDER_SITE_OTHER): Payer: Self-pay | Admitting: Physician Assistant

## 2020-10-27 ENCOUNTER — Ambulatory Visit: Payer: 59 | Admitting: Registered"

## 2020-10-27 DIAGNOSIS — Z713 Dietary counseling and surveillance: Secondary | ICD-10-CM

## 2020-10-27 NOTE — Progress Notes (Signed)
On 10/27/20 patient completed Core Session 10 of Diabetes Prevention Program course virtually with [facility]. The following learning objectives were met by the patient during this class:   I connected with Amelia Jo by a video enabled application and verified that I am speaking with the correct person.  Location: Patient: [place].  Provider: [place].   Learning Objectives:  List and describe the four keys for healthy eating out.   Give examples of how to apply these keys at the type of restaurants that the participants go to regularly.   Make an appropriate meal selection from a restaurant menu.   Demonstrate how to ask for a substitute item using assertive language and a polite tone of voice.    Goals:   Record weight taken outside of class.   Track foods and beverages eaten each day in the "Food and Activity Tracker," including calories and fat grams for each item.    Track activity type, minutes you were active, and distance you reached each day in the "Food and Activity Tracker."   Set aside one 20 to 30-minute block of time every day or find two or more periods of 10 to15 minutes each for physical activity.   Utilize positive action plan and complete questions on "To Do List."   Follow-Up Plan:  Attend Core Session 11.   Email completed "Food and Activity Tracker" to be reviewed by Lifestyle Coach.

## 2020-10-28 DIAGNOSIS — G4733 Obstructive sleep apnea (adult) (pediatric): Secondary | ICD-10-CM | POA: Diagnosis not present

## 2020-10-30 ENCOUNTER — Other Ambulatory Visit (INDEPENDENT_AMBULATORY_CARE_PROVIDER_SITE_OTHER): Payer: Self-pay | Admitting: Physician Assistant

## 2020-10-30 NOTE — Telephone Encounter (Signed)
Would you like me to refill rx?

## 2020-10-30 NOTE — Telephone Encounter (Signed)
Ok to send    thanks

## 2020-10-30 NOTE — Telephone Encounter (Signed)
Would you like me to refill her rx?

## 2020-11-03 ENCOUNTER — Encounter: Payer: 59 | Attending: Family Medicine | Admitting: Registered"

## 2020-11-03 DIAGNOSIS — Z713 Dietary counseling and surveillance: Secondary | ICD-10-CM | POA: Insufficient documentation

## 2020-11-03 DIAGNOSIS — R7303 Prediabetes: Secondary | ICD-10-CM | POA: Insufficient documentation

## 2020-11-06 ENCOUNTER — Encounter (INDEPENDENT_AMBULATORY_CARE_PROVIDER_SITE_OTHER): Payer: Self-pay | Admitting: Physician Assistant

## 2020-11-06 ENCOUNTER — Other Ambulatory Visit (INDEPENDENT_AMBULATORY_CARE_PROVIDER_SITE_OTHER): Payer: Self-pay | Admitting: Physician Assistant

## 2020-11-06 ENCOUNTER — Other Ambulatory Visit: Payer: Self-pay

## 2020-11-06 ENCOUNTER — Ambulatory Visit (INDEPENDENT_AMBULATORY_CARE_PROVIDER_SITE_OTHER): Payer: 59 | Admitting: Physician Assistant

## 2020-11-06 VITALS — BP 144/71 | HR 85 | Temp 97.8°F | Ht 65.0 in | Wt 215.0 lb

## 2020-11-06 DIAGNOSIS — Z6835 Body mass index (BMI) 35.0-35.9, adult: Secondary | ICD-10-CM

## 2020-11-06 DIAGNOSIS — Z9189 Other specified personal risk factors, not elsewhere classified: Secondary | ICD-10-CM

## 2020-11-06 DIAGNOSIS — E559 Vitamin D deficiency, unspecified: Secondary | ICD-10-CM

## 2020-11-06 DIAGNOSIS — E7849 Other hyperlipidemia: Secondary | ICD-10-CM | POA: Diagnosis not present

## 2020-11-06 MED ORDER — VITAMIN D (ERGOCALCIFEROL) 1.25 MG (50000 UNIT) PO CAPS: 50000.0000 [IU] | ORAL_CAPSULE | ORAL | 0 refills | Status: DC

## 2020-11-06 MED FILL — VIT D2 1.25 MG (50,000 UNIT: 1.25 MG | 28 days supply | Qty: 4 | Fill #0

## 2020-11-06 NOTE — Progress Notes (Signed)
Chief Complaint:   Hailey Noble is here to discuss her progress with her obesity treatment plan along with follow-up of her obesity related diagnoses. Hailey Noble is on the Category 3 Plan and states she is following her eating plan approximately 85-90% of the time. Hailey Noble states she is exercising 0 minutes 0 times per week.  Today's visit was #: 42 Starting weight: 212 lbs Starting date: 10/06/2017 Today's weight: 215 lbs Today's date: 11/06/2020 Total lbs lost to date: 0 Total lbs lost since last in-office visit: 0  Interim History: Hailey Noble was at the beach the last few weeks and states she ate out quite often. She started Saxenda 3 mg two weeks ago and states she has not been able to eat all of the food on the plan.  Subjective:   Vitamin D deficiency. Hailey Noble is on Vitamin D 4,000 units daily. No nausea, vomiting, or muscle weakness.    Ref. Range 10/12/2020 08:43  Vitamin D, 25-Hydroxy Latest Ref Range: 30.0 - 100.0 ng/mL 25.8 (L)   Other hyperlipidemia. Hailey Noble is on no medication and denies chest pain. She wants to exercise more often.  Lab Results  Component Value Date   CHOL 197 10/12/2020   HDL 49 10/12/2020   LDLCALC 128 (H) 10/12/2020   TRIG 113 10/12/2020   CHOLHDL 4.0 10/12/2020   Lab Results  Component Value Date   ALT 19 10/12/2020   AST 15 10/12/2020   ALKPHOS 144 (H) 10/12/2020   BILITOT 0.3 10/12/2020   The 10-year ASCVD risk score Mikey Bussing DC Jr., et al., 2013) is: 2.7%   Values used to calculate the score:     Age: 54 years     Sex: Female     Is Non-Hispanic African American: No     Diabetic: No     Tobacco smoker: No     Systolic Blood Pressure: 034 mmHg     Is BP treated: No     HDL Cholesterol: 49 mg/dL     Total Cholesterol: 197 mg/dL  At risk for osteoporosis. Hailey Noble is at higher risk of osteopenia and osteoporosis due to Vitamin D deficiency.   Assessment/Plan:   Vitamin D deficiency. Low Vitamin D level contributes to fatigue  and are associated with obesity, breast, and colon cancer. She was given a refill on her Vitamin D, Ergocalciferol, (DRISDOL) 1.25 MG (50000 UNIT) CAPS capsule every week #4 with 0 refills and will follow-up for routine testing of Vitamin D, at least 2-3 times per year to avoid over-replacement.   Other hyperlipidemia. Cardiovascular risk and specific lipid/LDL goals reviewed.  We discussed several lifestyle modifications today and [place] will continue to work on diet, exercise and weight loss efforts. Orders and follow up as documented in patient record. She will increase exercise to 3-5 times per week.  Counseling Intensive lifestyle modifications are the first line treatment for this issue. . Dietary changes: Increase soluble fiber. Decrease simple carbohydrates. . Exercise changes: Moderate to vigorous-intensity aerobic activity 150 minutes per week if tolerated. . Lipid-lowering medications: see documented in medical record.  At risk for osteoporosis. Hailey Noble was given approximately 15 minutes of osteoporosis prevention counseling today. Hailey Noble is at risk for osteopenia and osteoporosis due to her Vitamin D deficiency. She was encouraged to take her Vitamin D and follow her higher calcium diet and increase strengthening exercise to help strengthen her bones and decrease her risk of osteopenia and osteoporosis.  Repetitive spaced learning was employed today to elicit superior  memory formation and behavioral change.  Class 2 severe obesity with serious comorbidity and body mass index (BMI) of 35.0 to 35.9 in adult, unspecified obesity type ([place]). Hailey Noble will decrease Saxenda to 2.4 mg daily (no script).  Hailey Noble is currently in the action stage of change. As such, her goal is to continue with weight loss efforts. She has agreed to the Category 3 Plan and will journal 400-500 calories and 35 grams of protein at supper.   Exercise goals: For substantial health benefits, adults should do at least 150  minutes (2 hours and 30 minutes) a week of moderate-intensity, or 75 minutes (1 hour and 15 minutes) a week of vigorous-intensity aerobic physical activity, or an equivalent combination of moderate- and vigorous-intensity aerobic activity. Aerobic activity should be performed in episodes of at least 10 minutes, and preferably, it should be spread throughout the week.  Behavioral modification strategies: meal planning and cooking strategies and keeping healthy foods in the home.  Hailey Noble has agreed to follow-up with our clinic in 2-3 weeks. She was informed of the importance of frequent follow-up visits to maximize her success with intensive lifestyle modifications for her multiple health conditions.   Objective:   Blood pressure (!) 144/71, pulse 85, temperature 97.8 F (36.6 C), height 5\' 5"  (1.651 m), weight 215 lb (97.5 kg), last menstrual period 11/22/2018, SpO2 96 %. Body mass index is 35.78 kg/m.  General: Cooperative, alert, well developed, in no acute distress. HEENT: Conjunctivae and lids unremarkable. Cardiovascular: Regular rhythm.  Lungs: Normal work of breathing. Neurologic: No focal deficits.   Lab Results  Component Value Date   CREATININE 0.63 10/12/2020   BUN 17 10/12/2020   NA 141 10/12/2020   K 4.5 10/12/2020   CL 103 10/12/2020   CO2 26 10/12/2020   Lab Results  Component Value Date   ALT 19 10/12/2020   AST 15 10/12/2020   ALKPHOS 144 (H) 10/12/2020   BILITOT 0.3 10/12/2020   Lab Results  Component Value Date   HGBA1C 5.5 10/12/2020   HGBA1C 5.4 12/02/2019   HGBA1C 5.3 10/08/2019   HGBA1C 5.4 06/16/2019   HGBA1C 5.4 12/30/2018   Lab Results  Component Value Date   INSULIN 10.5 10/12/2020   INSULIN 10.1 04/10/2020   INSULIN 9.3 12/30/2018   INSULIN 11.1 09/21/2018   INSULIN 8.7 04/30/2018   Lab Results  Component Value Date   TSH 1.47 06/01/2020   Lab Results  Component Value Date   CHOL 197 10/12/2020   HDL 49 10/12/2020   LDLCALC 128 (H)  10/12/2020   TRIG 113 10/12/2020   CHOLHDL 4.0 10/12/2020   Lab Results  Component Value Date   WBC 7.7 06/01/2020   HGB 14.0 06/01/2020   HCT 41.7 06/01/2020   MCV 89.0 06/01/2020   PLT 349.0 06/01/2020   Lab Results  Component Value Date   IRON 94 06/01/2020   TIBC 363 06/01/2020   FERRITIN 36 06/01/2020   Attestation Statements:   Reviewed by clinician on day of visit: allergies, medications, problem list, medical history, surgical history, family history, social history, and previous encounter notes.  IMichaelene Song, am acting as transcriptionist for Abby Potash, PA-C   I have reviewed the above documentation for accuracy and completeness, and I agree with the above. Abby Potash, PA-C

## 2020-11-07 ENCOUNTER — Ambulatory Visit: Payer: 59 | Admitting: Neurology

## 2020-11-10 ENCOUNTER — Encounter: Payer: Self-pay | Admitting: Registered"

## 2020-11-10 ENCOUNTER — Ambulatory Visit: Payer: 59 | Admitting: Registered"

## 2020-11-10 DIAGNOSIS — Z713 Dietary counseling and surveillance: Secondary | ICD-10-CM

## 2020-11-10 NOTE — Progress Notes (Signed)
Patient was seen on 11/10/20 for the Core Session 12 of Diabetes Prevention Program course at [facility]. By the end of this session patients are able to complete the following objectives:   I connected with Amelia Jo by a video enabled application and verified that I am speaking with the correct person.  Location: Patient: Office.  Provider: Office.   Learning Objectives:  Describe their current progress toward defined goals.  Describe common causes for slipping from healthy eating or being  active.  Explain what to do to get back on their feet after a slip.  Goals:   Record weight taken outside of class.   Track foods and beverages eaten each day in the "Food and Activity Tracker," including calories and fat grams for each item.    Track activity type, minutes active, and distance reached each day in the "Food and Activity Tracker."   Try out the two action plans created during session- "Slips from Healthy Eating: Action Plan" and "Slips from Being Active: Action Plan"  Answer questions on the handout.   Follow-Up Plan:  Attend Core Session 13 next week.   Bring completed "Food and Activity Tracker" next week to be reviewed by Lifestyle Coach.

## 2020-11-13 ENCOUNTER — Ambulatory Visit: Payer: 59 | Admitting: Neurology

## 2020-11-13 ENCOUNTER — Encounter: Payer: Self-pay | Admitting: Neurology

## 2020-11-13 VITALS — BP 136/76 | HR 71 | Ht 65.0 in | Wt 218.0 lb

## 2020-11-13 DIAGNOSIS — G4733 Obstructive sleep apnea (adult) (pediatric): Secondary | ICD-10-CM | POA: Diagnosis not present

## 2020-11-13 DIAGNOSIS — Z9989 Dependence on other enabling machines and devices: Secondary | ICD-10-CM

## 2020-11-13 MED FILL — TECHLITE PEN NDL 32GX1/4: 32G X 6 MM | 90 days supply | Qty: 100 | Fill #0

## 2020-11-13 NOTE — Progress Notes (Signed)
Order for cpap pressure change and chinstrap sent to [facility] via community message. Confirmation received that the order transmitted was successful.

## 2020-11-13 NOTE — Progress Notes (Signed)
Subjective:    Patient ID: Hailey Noble is a 54 y.o. female.  HPI     Interim history:   Ms. Hailey Noble is a 54 year old right-handed woman with an underlying medical history of reflux disease, hyperlipidemia, anxiety, allergies, vitamin D deficiency, insulin resistance and obesity, who Presents for follow-up consultation of her obstructive sleep apnea after interim testing and starting CPAP therapy.  The patient is unaccompanied today.  I first met her on 07/04/2020 at the request of Abby Potash, [place], at which time the patient reported difficulty achieving weight loss and daytime somnolence.  She was advised to proceed with a sleep study.  She had a baseline sleep study followed by a CPAP titration study.  Baseline sleep study from 07/20/2020 showed a sleep efficiency of 84.7%, sleep latency 3 minutes, REM latency delayed at 160 minutes.  She had an increased percentage of stage II sleep and a mildly reduced percentage of REM sleep.  She had a total AHI in severe range at 44/h, REM AHI 73.8/h, supine AHI 64.6/h, average oxygen saturation was 92%, nadir was 74%.  She had no significant PLM's.  She was advised to return for a full night titration study.  She had this on 08/14/2020.  Sleep efficiency was 81.6%, sleep latency 15 minutes, REM latency slightly reduced at 166 minutes.  She was fitted with a small DreamWear full facemask and CPAP was titrated from 5cm to 14 cm.  On the final pressure her AHI was 0/h, O2 nadir 93% with supine REM sleep achieved.  She was advised to proceed with CPAP therapy at home.  Set up date was in October 2021.  Today, 11/13/2020: I reviewed her CPAP compliance data from 10/02/2020 through 10/31/2020, which is a total of 30 days, during which time she used her machine 26 days with percent use days greater than 4 hours at 70%, indicating adequate compliance with an average usage of 5 hours and 18 minutes, residual AHI at goal at 1.6/h, leak consistently on the higher side with the  95th percentile at 60.8 L/min on a pressure of 14 cm with EPR of 3.  She reports doing well, she has initial adjustment challenges to her CPAP but feels well adapted to treatment and has benefited from it.  She reports better sleep consolidation, better sleep quality and less daytime somnolence.  She does notice a leak from the mask, she tries to sleep sideways but she sleeps with her arm extended above her head on the right side.  She would wake up at times and the mask is leaking from the mouth and mouth is slightly open, she does have some dryness in her mouth from the CPAP, she also uses a humidifier in the room as well as distilled water in the humidifier chamber of the machine.  She uses a DreamWear full facemask.  She was started on Saxenda about a month ago.  She is still adjusting the dosing with her weight loss specialist.  Her right nostril is often stopped up and she does have some nosebleeds from the right side which is not severe.  The patient's allergies, current medications, family history, past medical history, past social history, past surgical history and problem list were reviewed and updated as appropriate.   Previously:   07/04/20: (She) reports excessive daytime somnolence and difficulty achieving weight loss.  I reviewed your office note from 05/31/2020.  Her Epworth sleepiness score is 19 out of 24, fatigue severity score is 33 out of 63.  In the past 6 months she has had increasing fatigue and daytime somnolence.  She has a family history of sleep apnea, both her mom and her sister have CPAP machines.  She had a total abdominal hysterectomy in October 2020.  She is single, lives alone, has 2 dogs in the household who tends to sleep on the bed with her, she has a large king-size bed.  She likes to sleep with a larger body pillow, typically sleeps on her right side.  She does wake up with mouth dryness and is a mouth breather.  She is not sure if she snores, has never been told.  She had  deviated septum repair and turbinate reduction under Dr. Wilburn Cornelia in the past.  She has reduced her soda intake and now limits her caffeine to 1 cup of coffee per day.  She is a non-smoker and does not currently drink any alcohol.  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  Bedtime is generally between 930 and 10 and rise time around 5:30 AM.  She has had occasional morning headaches which tend to be on the back, in the neck area.  She believes that she has tension headaches.  She has nocturia on average twice per night.   Her Past Medical History Is Significant For: Past Medical History:  Diagnosis Date  . Allergic state 12/08/2016  . Allergy   . Anxiety   . Back pain    " i just have a weak loer back"   . Fibroids   . GERD (gastroesophageal reflux disease)   . Hyperlipidemia 06/09/2017  . Insulin resistance   . Joint pain   . Knee pain, right 03/10/2017  . Plantar fasciitis   . PONV (postoperative nausea and vomiting)    just nausea   . Shortness of breath    not only if  i exert myselg   . Sinusitis   . Vitamin D deficiency 11/22/2016  . Vitamin D deficiency     Her Past Surgical History Is Significant For: Past Surgical History:  Procedure Laterality Date  . ABDOMINAL HYST[place]CTOMY  09/[place]   [place]  . COLONOSCOPY  02-2009   with Henrene Pastor normal  . CYSTOSCOPY N/A 10/12/2019   Procedure: CYSTOSCOPY;  Surgeon: Sherlyn Hay, DO;  Location: [facility];  Service: Gynecology;  Laterality: N/A;  . ESOPHAGOGASTRODUODENOSCOPY  2012  . GANGLION CYST EXCISION Right    right- wrist   . NASAL SEPTUM SURGERY    . NASAL SINUS SURGERY    . TMJ ARTHROSCOPY     left  . TOTAL LAPAROSCOPIC HYSTERECTOMY WITH BILATERAL SALPINGO OOPHORECTOMY Bilateral 10/12/2019   Procedure: TOTAL LAPAROSCOPIC HYSTERECTOMY WITH BILATERAL SALPINGO OOPHORECTOMY, Lysis Pelvic Adhseions ;  Surgeon: Sherlyn Hay, DO;  Location: [facility];  Service: Gynecology;  Laterality: Bilateral;    Her Family History Is Significant For: Family History  Problem Relation Age of Onset  . Hypertension Father   . Diabetes Father   . Hyperlipidemia Father   . Breast cancer Mother 37  . Cancer Mother        breast  . Thyroid disease Mother   . Sleep apnea Mother   . Colon cancer Paternal Grandfather        died at age 61  . Cancer Paternal Grandfather        GI CA  . Obesity Sister   .  Diabetes Sister   . Hypertension Sister   . Heart disease Maternal Grandmother        MI  . Cancer Maternal Grandfather        lung, smoker  . Diabetes Maternal Grandfather   . Diabetes Paternal Grandmother   . Heart disease Paternal Grandmother   . Pancreatitis Sister   . Esophageal cancer Neg Hx   . Stomach cancer Neg Hx   . Stroke Neg Hx   . Colon polyps Neg Hx   . Rectal cancer Neg Hx   . Pancreatic cancer Neg Hx     Her Social History Is Significant For: Social History   Socioeconomic History  . Marital status: Single    Spouse name: Not on file  . Number of children: Not on file  . Years of education: Not on file  . Highest education level: Not on file  Occupational History  . Occupation: Programmer, multimedia: [employer]  Tobacco Use  . Smoking status: Never Smoker  . Smokeless tobacco: Never Used  Vaping Use  . Vaping Use: Never used  Substance and Sexual Activity  . Alcohol use: Yes    Alcohol/week: 0.0 standard drinks    Comment: rare  . Drug use: No  . Sexual activity: Yes    Birth control/protection: I.U.D.  Other Topics Concern  . 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 Works at [employer]. No dietary restrictions. Lives with dog   Social Determinants of Health   Financial Resource Strain:   . Difficulty of Paying Living Expenses: Not on file  Food Insecurity:   . Worried About  Charity fundraiser in the Last Year: Not on file  . Ran Out of Food in the Last Year: Not on file  Transportation Needs:   . Lack of Transportation (Medical): Not on file  . Lack of Transportation (Non-Medical): Not on file  Physical Activity:   . Days of Exercise per Week: Not on file  . Minutes of Exercise per Session: Not on file  Stress:   . Feeling of Stress : Not on file  Social Connections:   . Frequency of Communication with Friends and Family: Not on file  . Frequency of Social Gatherings with Friends and Family: Not on file  . Attends Religious Services: Not on file  . Active Member of Clubs or Organizations: Not on file  . Attends Archivist Meetings: Not on file  . Marital Status: Not on file    Her Allergies Are:  Allergies  Allergen Reactions  . Coconut Fatty Acids Hives  . Codeine   . Mango Flavor Hives  . [name] Itching and Swelling  . Oxycodone-Acetaminophen     rash on face and head, itching  . Tussionex Pennkinetic Er [Hydrocod Polst-Cpm Polst Er]     rash on face and head, itching  . Ultram [Tramadol] Other (See Comments)    Dizzy and low blood pressure.   :   Her Current Medications Are:  Outpatient Encounter Medications as of 11/13/2020  Medication Sig  . acetaminophen (TYLENOL) 500 MG tablet Take 2 tablets (1,000 mg total) by mouth every 6 (six) hours as needed for moderate pain.  . Artificial Tear Solution (SOOTHE XP) SOLN Place 1 drop into both eyes daily as needed (dry eyes).  . Calcium Carbonate-Vitamin D (CALTRATE 600+D PO) Take 2 tablets by mouth daily.  Marland Kitchen  escitalopram (LEXAPRO) 10 MG tablet Take 1 tablet (10 mg total) by mouth daily.  Javier Docker Oil 500 MG CAPS Take 500 mg by mouth daily.  . metFORMIN (GLUCOPHAGE) 500 MG tablet Take 1 tablet (500 mg total) by mouth daily with breakfast.  . mometasone (NASONEX) 50 MCG/ACT nasal spray Place 2 sprays into the nose daily.  . Multiple Vitamin (MULTIVITAMIN WITH MINERALS) TABS tablet  Take 1 tablet by mouth daily.  Marland Kitchen SAXENDA 18 MG/3ML SOPN INJECT 0.5 MLS (3 MG TOTAL) INTO THE SKIN DAILY.  Marland Kitchen TECHLITE PEN NEEDLES 32G X 6 MM MISC USE AS DIRECTED TO INJECT SAXENDA  . vitamin C (ASCORBIC ACID) 500 MG tablet Take 500 mg by mouth daily.  . Vitamin D, Ergocalciferol, (DRISDOL) 1.25 MG (50000 UNIT) CAPS capsule Take 1 capsule (50,000 Units total) by mouth every 7 (seven) days.   No facility-administered encounter medications on file as of 11/13/2020.  :  Review of Systems:  Out of a complete 14 point review of systems, all are reviewed and negative with the exception of these symptoms as listed below: Review of Systems  Neurological:       Pt presents today to discuss her cpap. Pt feels much better on PAP therapy.    Objective:  Neurological Exam  Physical Exam Physical Examination:   Vitals:   11/13/20 1028  BP: 136/76  Pulse: 71    General Examination: The patient is a very pleasant 54 y.o. female in no acute distress. She appears well-developed and well-nourished and well groomed.   HEENT: Normocephalic, atraumatic, pupils are equal, round and reactive to light, extraocular tracking is good without limitation to gaze excursion or nystagmus noted. Hearing is grossly intact. Face is symmetric with normal facial animation. Speech is clear with no dysarthria noted. There is no hypophonia. There is no lip, neck/head, jaw or voice tremor. Neck is supple with full range of passive and active motion. There are no carotid bruits on auscultation. Oropharynx exam reveals: mild mouth dryness, adequate dental hygiene and moderate airway crowding.  Tongue protrudes centrally and palate elevates symmetrically.    Chest: Clear to auscultation without wheezing, rhonchi or crackles noted.  Heart: S1+S2+0, regular and normal without murmurs, rubs or gallops noted.   Abdomen: Soft, non-tender and non-distended with normal bowel sounds appreciated on auscultation.  Extremities:  There is no pitting edema in the distal lower extremities bilaterally.   Skin: Warm and dry without trophic changes noted.   Musculoskeletal: exam reveals no obvious joint deformities, tenderness or joint swelling or erythema.   Neurologically:  Mental status: The patient is awake, alert and oriented in all 4 spheres. Her immediate and remote memory, attention, language skills and fund of knowledge are appropriate. There is no evidence of aphasia, agnosia, apraxia or anomia. Speech is clear with normal prosody and enunciation. Thought process is linear. Mood is normal and affect is normal.  Cranial nerves II - XII are as described above under HEENT exam.  Motor exam: Normal bulk, strength and tone is noted. There is no tremor, Romberg is negative. Fine motor skills and coordination: grossly intact.  Cerebellar testing: No dysmetria or intention tremor. There is no truncal or gait ataxia.  Sensory exam: intact to light touch in the upper and lower extremities.  Gait, station and balance: She stands easily. No veering to one side is noted. No leaning to one side is noted. Posture is age-appropriate and stance is narrow based. Gait shows normal stride length and normal pace.  No problems turning are noted. Tandem walk is unremarkable.                Assessment and Plan:  In summary, VARIE MACHAMER is a very pleasant 54 year old female with an underlying medical history of reflux disease, hyperlipidemia, anxiety, allergies, vitamin D deficiency, insulin resistance and obesity, who presents for follow-up consultation of her obstructive sleep apnea after interim testing and starting CPAP therapy.  She is compliant with treatment.  Of note, she has had some lapses in treatment but reports that she took her machine when she was out of town.  There could be some connectivity issues with that.  She has not skipped any nights per se except for one that she is aware of when she put the mask on but forgot to  turn the machine on.  She has adjusted well to treatment and benefits from it.  She is commended on her treatment adherence and advised to continue to work on weight management and full compliance with her CPAP.  We talked about her sleep study results in detail as well as her compliance data.  She does have a higher leak.  She is advised to try a chinstrap which I will prescribe.  In addition, I would like to reduce her pressure to 13 cm at this time.  She is motivated to continue with treatment.  She is advised to follow-up routinely to see one of our nurse practitioners in 6 months.  She should call our office if she has not received her chinstrap or noticed a change in the pressure.  I answered all her questions today and she was in agreement with the plan.   I spent 30 minutes in total face-to-face time and in reviewing records during pre-charting, more than 50% of which was spent in counseling and coordination of care, reviewing test results, reviewing medications and treatment regimen and/or in discussing or reviewing the diagnosis of OSA, the prognosis and treatment options. Pertinent laboratory and imaging test results that were available during this visit with the patient were reviewed by me and considered in my medical decision making (see chart for details).

## 2020-11-13 NOTE — Patient Instructions (Addendum)
It was good to see you again today.  I am glad to hear that you are feeling better with regards to your sleep quality and sleep consolidation.  As discussed, we will try a slightly reduced pressure to see if your mouth dryness and leak improve, also, I would like for you to try a chinstrap to help hold up your lower jaw while you are asleep.    Please continue using your CPAP regularly. While your insurance requires that you use CPAP at least 4 hours each night on 70% of the nights, I recommend, that you not skip any nights and use it throughout the night if you can. Getting used to CPAP and staying with the treatment long term does take time and patience and discipline. Untreated obstructive sleep apnea when it is moderate to severe can have an adverse impact on cardiovascular health and raise her risk for heart disease, arrhythmias, hypertension, congestive heart failure, stroke and diabetes. Untreated obstructive sleep apnea causes sleep disruption, nonrestorative sleep, and sleep deprivation. This can have an impact on your day to day functioning and cause daytime sleepiness and impairment of cognitive function, memory loss, mood disturbance, and problems focussing. Using CPAP regularly can improve these symptoms.  Keep up the good work! We can see you in 6 months, you can see one of our nurse practitioners.

## 2020-11-14 ENCOUNTER — Ambulatory Visit: Payer: 59 | Attending: Internal Medicine

## 2020-11-14 ENCOUNTER — Other Ambulatory Visit (HOSPITAL_BASED_OUTPATIENT_CLINIC_OR_DEPARTMENT_OTHER): Payer: Self-pay | Admitting: Internal Medicine

## 2020-11-17 DIAGNOSIS — G4733 Obstructive sleep apnea (adult) (pediatric): Secondary | ICD-10-CM | POA: Diagnosis not present

## 2020-11-17 MED FILL — PFIZER-BIONTECH COVID-19 VA: 30 | 1 days supply | Qty: 0 | Fill #0

## 2020-11-23 ENCOUNTER — Ambulatory Visit: Payer: 59 | Admitting: Family Medicine

## 2020-11-24 ENCOUNTER — Encounter: Payer: Self-pay | Admitting: Registered"

## 2020-11-24 ENCOUNTER — Encounter: Payer: 59 | Attending: Family Medicine | Admitting: Registered"

## 2020-11-24 DIAGNOSIS — Z713 Dietary counseling and surveillance: Secondary | ICD-10-CM

## 2020-11-24 NOTE — Progress Notes (Signed)
On 11/24/20 patient completed the Core Session 13 of Diabetes Prevention Program course virtually with [facility]. By the end of this session patients are able to complete the following objectives:   I connected with Hailey Noble by a video enabled application and verified that I am speaking with the correct person.  Location: Patient: Office.  Provider: [facility] office.   Learning Objectives:  Describe ways to add interest and variety to their activity plans.  Define ?aerobic fitness.  Explain the four F.I.T.T. principles (frequency, intensity, time, and type of activity) and how they relate to aerobic fitness.   Goals:   Record weight taken outside of class.   Track foods and beverages eaten each day in the "Food and Activity Tracker," including calories and fat grams for each item.    Track activity type, minutes you were active, and distance you reached each day in the "Food and Activity Tracker."   Do your best to reach activity goal for the week.  Use one of the F.I.T.T. principles to jump start workouts.  Document activity level on the "To Do Next Week" handout.  Follow-Up Plan:  Attend Core Session 14 next week.   Email completed "Food and Activity Tracker" before next week to be reviewed by Lifestyle Coach.

## 2020-11-27 DIAGNOSIS — G4733 Obstructive sleep apnea (adult) (pediatric): Secondary | ICD-10-CM | POA: Diagnosis not present

## 2020-11-29 ENCOUNTER — Other Ambulatory Visit: Payer: Self-pay

## 2020-11-29 ENCOUNTER — Other Ambulatory Visit (INDEPENDENT_AMBULATORY_CARE_PROVIDER_SITE_OTHER): Payer: Self-pay | Admitting: Physician Assistant

## 2020-11-29 ENCOUNTER — Encounter (INDEPENDENT_AMBULATORY_CARE_PROVIDER_SITE_OTHER): Payer: Self-pay | Admitting: Physician Assistant

## 2020-11-29 ENCOUNTER — Ambulatory Visit (INDEPENDENT_AMBULATORY_CARE_PROVIDER_SITE_OTHER): Payer: 59 | Admitting: Physician Assistant

## 2020-11-29 VITALS — BP 125/75 | HR 77 | Temp 98.0°F | Ht 65.0 in | Wt 214.0 lb

## 2020-11-29 DIAGNOSIS — Z6835 Body mass index (BMI) 35.0-35.9, adult: Secondary | ICD-10-CM

## 2020-11-29 DIAGNOSIS — Z9189 Other specified personal risk factors, not elsewhere classified: Secondary | ICD-10-CM

## 2020-11-29 DIAGNOSIS — E559 Vitamin D deficiency, unspecified: Secondary | ICD-10-CM | POA: Diagnosis not present

## 2020-11-29 DIAGNOSIS — R7303 Prediabetes: Secondary | ICD-10-CM | POA: Diagnosis not present

## 2020-11-29 MED ORDER — VITAMIN D (ERGOCALCIFEROL) 1.25 MG (50000 UNIT) PO CAPS: 50000.0000 [IU] | ORAL_CAPSULE | ORAL | 0 refills | Status: DC

## 2020-11-29 MED ORDER — METFORMIN HCL 500 MG PO TABS: 500.0000 mg | ORAL_TABLET | Freq: Every day | ORAL | 0 refills | Status: DC

## 2020-11-29 MED ORDER — SAXENDA 18 MG/3ML ~~LOC~~ SOPN: 3.0000 mg | PEN_INJECTOR | Freq: Every day | SUBCUTANEOUS | 0 refills | Status: DC

## 2020-11-29 MED FILL — SAXENDA 18 MG/3 ML PEN: 18 | 30 days supply | Qty: 15 | Fill #0

## 2020-11-29 MED FILL — METFORMIN HCL 500 MG TABS: 500 | 90 days supply | Qty: 90 | Fill #0

## 2020-11-29 MED FILL — VIT D2 1.25 MG (50,000 UNIT: 1.25 MG | 28 days supply | Qty: 4 | Fill #0

## 2020-11-29 NOTE — Progress Notes (Signed)
Chief Complaint:   Hailey Noble is here to discuss her progress with her obesity treatment plan along with follow-up of her obesity related diagnoses. Hailey Noble is on the Category 3 Plan and states she is following her eating plan approximately 90% of the time. Hailey Noble states she is walking and swimming 1 hour and 15 minutes 3 times per week.  Today's visit was #: 3 Starting weight: 212 lbs Starting date: 10/06/2017 Today's weight: 214 lbs Today's date: 11/29/2020 Total lbs lost to date: 0 Total lbs lost since last in-office visit: 1  Interim History: Hailey Noble was on Saxenda 2.4 mg for one week and then increased by one click and feels her hunger is controlled. She continues to have sweet cravings, but did not tolerate Wellbutrin and had to discontinue it.  Subjective:   Prediabetes. Hailey Noble has a diagnosis of prediabetes based on her elevated HgA1c and was informed this puts her at greater risk of developing diabetes. She continues to work on diet and exercise to decrease her risk of diabetes. Hailey Noble is on metformin and continues to have cravings. No nausea, vomiting, or diarrhea.   Lab Results  Component Value Date   HGBA1C 5.5 10/12/2020   Lab Results  Component Value Date   INSULIN 10.5 10/12/2020   INSULIN 10.1 04/10/2020   INSULIN 9.3 12/30/2018   INSULIN 11.1 09/21/2018   INSULIN 8.7 04/30/2018   Vitamin D deficiency. Hailey Noble is on prescription Vitamin D, which she is tolerating well. Last level was not at goal.   Ref. Range 10/12/2020 08:43  Vitamin D, 25-Hydroxy Latest Ref Range: 30.0 - 100.0 ng/mL 25.8 (L)   At risk for hypoglycemia. Hailey Noble is at increased risk for hypoglycemia due to changes in diet, diagnosis of diabetes, and/or insulin use.   Assessment/Plan:   Prediabetes. Hailey Noble will continue to work on weight loss, exercise, and decreasing simple carbohydrates to help decrease the risk of diabetes. Refill was given for metFORMIN (GLUCOPHAGE) 500 MG  tablet #90 with 0 refills.  Vitamin D deficiency. Low Vitamin D level contributes to fatigue and are associated with obesity, breast, and colon cancer. She was given a refill on her Vitamin D, Ergocalciferol, (DRISDOL) 1.25 MG (50000 UNIT) CAPS capsule every week #4 with 0 refills and will follow-up for routine testing of Vitamin D, at least 2-3 times per year to avoid over-replacement.   At risk for hypoglycemia. Hailey Noble was given approximately 15 minutes of counseling today regarding prevention of hypoglycemia. She was advised of symptoms of hypoglycemia. Hailey Noble was instructed to avoid skipping meals, eat regular protein rich meals and schedule low calorie snacks as needed.   Repetitive spaced learning was employed today to elicit superior memory formation and behavioral change.   Class 2 severe obesity with serious comorbidity and body mass index (BMI) of 35.0 to 35.9 in adult, unspecified obesity type ([place]). Refill was given for Liraglutide - Weight Management (SAXENDA) 18 MG/3ML SOPN #5 with 0 refills.  Hailey Noble is currently in the action stage of change. As such, her goal is to continue with weight loss efforts. She has agreed to the Category 3 Plan and will journal 450-600 calories and 40 grams of protein at supper.   Exercise goals: For substantial health benefits, adults should do at least 150 minutes (2 hours and 30 minutes) a week of moderate-intensity, or 75 minutes (1 hour and 15 minutes) a week of vigorous-intensity aerobic physical activity, or an equivalent combination of moderate- and vigorous-intensity aerobic activity. Aerobic  activity should be performed in episodes of at least 10 minutes, and preferably, it should be spread throughout the week.  Behavioral modification strategies: meal planning and cooking strategies and keeping healthy foods in the home.  Hailey Noble has agreed to follow-up with our clinic in 4 weeks. She was informed of the importance of frequent follow-up visits to  maximize her success with intensive lifestyle modifications for her multiple health conditions.   Objective:   Blood pressure 125/75, pulse 77, temperature 98 F (36.7 C), height 5\' 5"  (1.651 m), weight 214 lb (97.1 kg), last menstrual period 11/22/2018, SpO2 96 %. Body mass index is 35.61 kg/m.  General: Cooperative, alert, well developed, in no acute distress. HEENT: Conjunctivae and lids unremarkable. Cardiovascular: Regular rhythm.  Lungs: Normal work of breathing. Neurologic: No focal deficits.   Lab Results  Component Value Date   CREATININE 0.63 10/12/2020   BUN 17 10/12/2020   NA 141 10/12/2020   K 4.5 10/12/2020   CL 103 10/12/2020   CO2 26 10/12/2020   Lab Results  Component Value Date   ALT 19 10/12/2020   AST 15 10/12/2020   ALKPHOS 144 (H) 10/12/2020   BILITOT 0.3 10/12/2020   Lab Results  Component Value Date   HGBA1C 5.5 10/12/2020   HGBA1C 5.4 12/02/2019   HGBA1C 5.3 10/08/2019   HGBA1C 5.4 06/16/2019   HGBA1C 5.4 12/30/2018   Lab Results  Component Value Date   INSULIN 10.5 10/12/2020   INSULIN 10.1 04/10/2020   INSULIN 9.3 12/30/2018   INSULIN 11.1 09/21/2018   INSULIN 8.7 04/30/2018   Lab Results  Component Value Date   TSH 1.47 06/01/2020   Lab Results  Component Value Date   CHOL 197 10/12/2020   HDL 49 10/12/2020   LDLCALC 128 (H) 10/12/2020   TRIG 113 10/12/2020   CHOLHDL 4.0 10/12/2020   Lab Results  Component Value Date   WBC 7.7 06/01/2020   HGB 14.0 06/01/2020   HCT 41.7 06/01/2020   MCV 89.0 06/01/2020   PLT 349.0 06/01/2020   Lab Results  Component Value Date   IRON 94 06/01/2020   TIBC 363 06/01/2020   FERRITIN 36 06/01/2020   Attestation Statements:   Reviewed by clinician on day of visit: allergies, medications, problem list, medical history, surgical history, family history, social history, and previous encounter notes.  IMichaelene Song, am acting as transcriptionist for Abby Potash, PA-C   I have  reviewed the above documentation for accuracy and completeness, and I agree with the above. Abby Potash, PA-C

## 2020-12-01 ENCOUNTER — Encounter: Payer: 59 | Admitting: Registered"

## 2020-12-08 ENCOUNTER — Ambulatory Visit: Payer: 59 | Admitting: Registered"

## 2020-12-08 ENCOUNTER — Encounter: Payer: Self-pay | Admitting: Registered"

## 2020-12-08 DIAGNOSIS — Z713 Dietary counseling and surveillance: Secondary | ICD-10-CM

## 2020-12-08 NOTE — Progress Notes (Signed)
On 12/08/20 patient completed Core Session 15 of Diabetes Prevention Program course virtually with [facility]. By the end of this session patients are able to complete the following objectives:   I connected with Amelia Jo by a video enabled application and verified that I am speaking with the correct person.  Location: Patient: Office.  Provider: [place].   Learning Objectives:  Explain how to prevent stress or cope with unavoidable stress.   Describe how this program can be a source of stress.   Explain how to manage stressful situations.   Create and follow an action plan for either preventing or coping with a stressful situation.   Goals:   Record weight taken outside of class.   Track foods and beverages eaten each day in the "Food and Activity Tracker," including calories and fat grams for each item.    Track activity type, minutes you were active, and distance you reached each day in the "Food and Activity Tracker."   Do your best to reach activity goal for the week.  Follow your action plan to reduce stress.   Answer questions on handout regarding success of action plan.   Follow-Up Plan:  Attend Core Session 16.   Email completed "Food and Activity Tracker" before next week to be reviewed by Lifestyle Coach.

## 2020-12-14 DIAGNOSIS — Z1231 Encounter for screening mammogram for malignant neoplasm of breast: Secondary | ICD-10-CM | POA: Diagnosis not present

## 2020-12-14 LAB — HM MAMMOGRAPHY

## 2020-12-21 ENCOUNTER — Encounter: Payer: Self-pay | Admitting: *Deleted

## 2020-12-22 DIAGNOSIS — G4733 Obstructive sleep apnea (adult) (pediatric): Secondary | ICD-10-CM | POA: Diagnosis not present

## 2020-12-25 ENCOUNTER — Other Ambulatory Visit: Payer: Self-pay

## 2020-12-26 ENCOUNTER — Encounter: Payer: Self-pay | Admitting: Family Medicine

## 2020-12-26 ENCOUNTER — Ambulatory Visit: Payer: 59 | Admitting: Family Medicine

## 2020-12-26 ENCOUNTER — Ambulatory Visit: Payer: 59 | Attending: Internal Medicine

## 2020-12-26 ENCOUNTER — Other Ambulatory Visit (HOSPITAL_BASED_OUTPATIENT_CLINIC_OR_DEPARTMENT_OTHER): Payer: Self-pay | Admitting: Internal Medicine

## 2020-12-26 DIAGNOSIS — G4730 Sleep apnea, unspecified: Secondary | ICD-10-CM | POA: Diagnosis not present

## 2020-12-26 DIAGNOSIS — E669 Obesity, unspecified: Secondary | ICD-10-CM

## 2020-12-26 DIAGNOSIS — E559 Vitamin D deficiency, unspecified: Secondary | ICD-10-CM

## 2020-12-26 DIAGNOSIS — E8881 Metabolic syndrome: Secondary | ICD-10-CM | POA: Diagnosis not present

## 2020-12-26 DIAGNOSIS — Z23 Encounter for immunization: Secondary | ICD-10-CM

## 2020-12-26 HISTORY — DX: Sleep apnea, unspecified: G47.30

## 2020-12-26 MED ORDER — BUPROPION HCL 75 MG PO TABS: 75.0000 mg | ORAL_TABLET | Freq: Every day | ORAL | 1 refills | Status: DC

## 2020-12-26 MED FILL — PFIZER-BIONTECH COVID-19 VA: 30 | 21 days supply | Qty: 0 | Fill #0

## 2020-12-26 MED FILL — BUPROPION HCL 75 MG TABS: 75 | 90 days supply | Qty: 90 | Fill #0

## 2020-12-26 NOTE — Progress Notes (Signed)
   Covid-19 Vaccination Clinic  Name:  Hailey Noble    MRN: [id number] DOB: 11-24-1966  12/26/2020  Hailey Noble was observed post Covid-19 immunization for 15 minutes without incident. She was provided with Vaccine Information Sheet and instruction to access the V-Safe system.  Vaccinated by Fredirick Maudlin  Hailey Noble was instructed to call 911 with any severe reactions post vaccine: Marland Kitchen Difficulty breathing  . Swelling of face and throat  . A fast heartbeat  . A bad rash all over body  . Dizziness and weakness   Immunizations Administered    Name Date Dose VIS Date Route   Pfizer COVID-19 Vaccine 12/26/2020  9:14 AM 0.3 mL 10/11/2020 Intramuscular   Manufacturer: [employer], [employer]   Lot: [id number]   NDC: [address]-3

## 2020-12-26 NOTE — Assessment & Plan Note (Signed)
Tolerating Saxenda. Continue to minimize carbs and monitor

## 2020-12-26 NOTE — Assessment & Plan Note (Signed)
ook 70929 IU weekly for 4 weeks and is now taking 4000 IU daily which is what she was taking when she tested low. She is asked to increase her daily to 5 or 6000 IU daily and recheck her level 3 months after the change.

## 2020-12-26 NOTE — Progress Notes (Signed)
Subjective:    Patient ID: Hailey Noble, female    DOB: 03-05-1966, 55 y.o.   MRN: [id number]  No chief complaint on file.   HPI Patient is in today for follow up on chronic medical concerns. No recent febrile illness or hospitalizations. She has recently been diagnosed with sleep apnea and is using her CPAP routinely. She was struggling with excessive fatigue in the mornings and that has improved some. She is frustrated with her weight still. On Saxenda she is hoping to see more weight loss. She feels wellbutrin helped her appetite more but dried her out significantly. Denies CP/palp/SOB/HA/congestion/fevers/GI or GU c/o. Taking meds as prescribed  Past Medical History:  Diagnosis Date  . Allergic state 12/08/2016  . Allergy   . Anxiety   . Back pain    " i just have a weak loer back"   . Fibroids   . GERD (gastroesophageal reflux disease)   . Hyperlipidemia 06/09/2017  . Insulin resistance   . Joint pain   . Knee pain, right 03/10/2017  . Plantar fasciitis   . PONV (postoperative nausea and vomiting)    just nausea   . Shortness of breath    not only if  i exert myselg   . Sinusitis   . Sleep apnea in adult 12/26/2020  . Vitamin D deficiency 11/22/2016  . Vitamin D deficiency     Past Surgical History:  Procedure Laterality Date  . ABDOMINAL HYSTERECTOMY  09/2019   [place]  . COLONOSCOPY  02-2009   with Henrene Pastor normal  . CYSTOSCOPY N/A 10/12/2019   Procedure: CYSTOSCOPY;  Surgeon: Sherlyn Hay, DO;  Location: [facility];  Service: Gynecology;  Laterality: N/A;  . ESOPHAGOGASTRODUODENOSCOPY  2012  . GANGLION CYST EXCISION Right    right- wrist   . NASAL SEPTUM SURGERY    . NASAL SINUS SURGERY    . TMJ ARTHROSCOPY     left  . TOTAL LAPAROSCOPIC HYSTERECTOMY WITH BILATERAL SALPINGO OOPHORECTOMY Bilateral 10/12/2019   Procedure: TOTAL LAPAROSCOPIC HYSTERECTOMY WITH BILATERAL SALPINGO OOPHORECTOMY, Lysis Pelvic Adhseions ;  Surgeon: Sherlyn Hay, DO;  Location: [facility];  Service: Gynecology;  Laterality: Bilateral;    Family History  Problem Relation Age of Onset  . Hypertension Father   . Diabetes Father   . Hyperlipidemia Father   . Breast cancer Mother 34  . Cancer Mother        breast  . Thyroid disease Mother   . Sleep apnea Mother   . Colon cancer Paternal Grandfather        died at age 82  . Cancer Paternal Grandfather        GI CA  . Obesity Sister   . Diabetes Sister   . Hypertension Sister   . Heart disease Maternal Grandmother        MI  . Cancer Maternal Grandfather        lung, smoker  . Diabetes Maternal Grandfather   . Diabetes Paternal Grandmother   . Heart disease Paternal Grandmother   . Pancreatitis Sister   . Esophageal cancer Neg Hx   . Stomach cancer Neg Hx   . Stroke Neg Hx   . Colon polyps Neg Hx   . Rectal cancer Neg Hx   . Pancreatic cancer Neg Hx     Social History   Socioeconomic History  . Marital status: Single    Spouse name: Not on file  . Number of  children: Not on file  . Years of education: Not on file  . Highest education level: Not on file  Occupational History  . Occupation: Teacher, adult education: [employer]  Tobacco Use  . Smoking status: Never Smoker  . Smokeless tobacco: Never Used  Vaping Use  . Vaping Use: Never used  Substance and Sexual Activity  . Alcohol use: Yes    Alcohol/week: 0.0 standard drinks    Comment: rare  . Drug use: No  . Sexual activity: Yes    Birth control/protection: I.U.D.  Other Topics Concern  . 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 Works at [employer] center. No dietary restrictions. Lives with dog   Social Determinants of Health   Financial Resource Strain: Not on file  Food Insecurity: Not on file  Transportation Needs: Not on file  Physical Activity: Not on file  Stress: Not on file  Social Connections: Not on file  Intimate  Partner Violence: Not on file    Outpatient Medications Prior to Visit  Medication Sig Dispense Refill  . acetaminophen (TYLENOL) 500 MG tablet Take 2 tablets (1,000 mg total) by mouth every 6 (six) hours as needed for moderate pain. 30 tablet 0  . Artificial Tear Solution (SOOTHE XP) SOLN Place 1 drop into both eyes daily as needed (dry eyes).    . Calcium Carbonate-Vitamin D (CALTRATE 600+D PO) Take 2 tablets by mouth daily.    Marland Kitchen escitalopram (LEXAPRO) 10 MG tablet Take 1 tablet (10 mg total) by mouth daily. 90 tablet 3  . Krill Oil 500 MG CAPS Take 500 mg by mouth daily.    . Liraglutide -Weight Management (SAXENDA) 18 MG/3ML SOPN Inject 3 mg into the skin daily. 15 mL 0  . metFORMIN (GLUCOPHAGE) 500 MG tablet Take 1 tablet (500 mg total) by mouth daily with breakfast. 90 tablet 0  . mometasone (NASONEX) 50 MCG/ACT nasal spray Place 2 sprays into the nose daily. 51 g 3  . Multiple Vitamin (MULTIVITAMIN WITH MINERALS) TABS tablet Take 1 tablet by mouth daily.    . TECHLITE PEN NEEDLES 32G X 6 MM MISC USE AS DIRECTED TO INJECT SAXENDA 100 each 0  . vitamin C (ASCORBIC ACID) 500 MG tablet Take 500 mg by mouth daily.    . Vitamin D, Ergocalciferol, (DRISDOL) 1.25 MG (50000 UNIT) CAPS capsule Take 1 capsule (50,000 Units total) by mouth every 7 (seven) days. 4 capsule 0   No facility-administered medications prior to visit.    Allergies  Allergen Reactions  . Coconut Fatty Acids Hives  . Codeine   . Mango Flavor Hives  . [name] Itching and Swelling  . Oxycodone-Acetaminophen     rash on face and head, itching  . Tussionex Pennkinetic Er [Hydrocod Polst-Cpm Polst Er]     rash on face and head, itching  . Ultram [Tramadol] Other (See Comments)    Dizzy and low blood pressure.     Review of Systems  Constitutional: Positive for malaise/fatigue. Negative for fever.  HENT: Negative for congestion.   Eyes: Negative for blurred vision.  Respiratory: Negative for shortness of breath.    Cardiovascular: Negative for chest pain, palpitations and leg swelling.  Gastrointestinal: Negative for abdominal pain, blood in stool and nausea.  Genitourinary: Negative for dysuria and frequency.  Musculoskeletal: Negative for falls.  Skin: Negative for rash.  Neurological: Negative for dizziness, loss of consciousness and headaches.  Endo/Heme/Allergies: Negative for environmental allergies.  Psychiatric/Behavioral: Negative for depression. The patient is not nervous/anxious.        Objective:    Physical Exam Vitals and nursing note reviewed.  Constitutional:      General: She is not in acute distress.    Appearance: She is well-developed and well-nourished.  HENT:     Head: Normocephalic and atraumatic.     Nose: Nose normal.  Eyes:     General:        Right eye: No discharge.        Left eye: No discharge.  Cardiovascular:     Rate and Rhythm: Normal rate and regular rhythm.     Heart sounds: No murmur heard.   Pulmonary:     Effort: Pulmonary effort is normal.     Breath sounds: Normal breath sounds.  Abdominal:     General: Bowel sounds are normal.     Palpations: Abdomen is soft.     Tenderness: There is no abdominal tenderness.  Musculoskeletal:        General: No edema.     Cervical back: Normal range of motion and neck supple.  Skin:    General: Skin is warm and dry.  Neurological:     Mental Status: She is alert and oriented to person, place, and time.  Psychiatric:        Mood and Affect: Mood and affect normal.     LMP 11/22/2018 (Approximate)  Wt Readings from Last 3 Encounters:  11/29/20 214 lb (97.1 kg)  11/13/20 218 lb (98.9 kg)  11/06/20 215 lb (97.5 kg)    Diabetic Foot Exam - Simple   No data filed    Lab Results  Component Value Date   WBC 7.7 06/01/2020   HGB 14.0 06/01/2020   HCT 41.7 06/01/2020   PLT 349.0 06/01/2020   GLUCOSE 82 10/12/2020   CHOL 197 10/12/2020   TRIG 113 10/12/2020   HDL 49 10/12/2020   LDLCALC 128  (H) 10/12/2020   ALT 19 10/12/2020   AST 15 10/12/2020   NA 141 10/12/2020   K 4.5 10/12/2020   CL 103 10/12/2020   CREATININE 0.63 10/12/2020   BUN 17 10/12/2020   CO2 26 10/12/2020   TSH 1.47 06/01/2020   HGBA1C 5.5 10/12/2020    Lab Results  Component Value Date   TSH 1.47 06/01/2020   Lab Results  Component Value Date   WBC 7.7 06/01/2020   HGB 14.0 06/01/2020   HCT 41.7 06/01/2020   MCV 89.0 06/01/2020   PLT 349.0 06/01/2020   Lab Results  Component Value Date   NA 141 10/12/2020   K 4.5 10/12/2020   CO2 26 10/12/2020   GLUCOSE 82 10/12/2020   BUN 17 10/12/2020   CREATININE 0.63 10/12/2020   BILITOT 0.3 10/12/2020   ALKPHOS 144 (H) 10/12/2020   AST 15 10/12/2020   ALT 19 10/12/2020   PROT 6.8 10/12/2020   ALBUMIN 4.5 10/12/2020   CALCIUM 9.4 10/12/2020   ANIONGAP 8 10/08/2019   GFR 114.72 06/01/2020   Lab Results  Component Value Date   CHOL 197 10/12/2020   Lab Results  Component Value Date   HDL 49 10/12/2020   Lab Results  Component Value Date   LDLCALC 128 (H) 10/12/2020   Lab Results  Component Value Date   TRIG 113 10/12/2020   Lab Results  Component Value Date   CHOLHDL 4.0 10/12/2020   Lab Results  Component Value  Date   HGBA1C 5.5 10/12/2020       Assessment & Plan:   Problem List Items Addressed This Visit    Obesity (BMI 30-39.9)    Is working with healthy weight and wellness and tolerating Saxenda at 2.4 dosing. At 3.0 she had no appetite at all. She notes Wellbutrin helped with appetite suppression best but dried her out too much at 150 mg dose. Will try Wellbutrin 75 mg tabs, 1/2 to 1 tab daily as tolerated and reassess in 3 months or as needed.      Vitamin D deficiency    ook 50000 IU weekly for 4 weeks and is now taking 4000 IU daily which is what she was taking when she tested low. She is asked to increase her daily to 5 or 6000 IU daily and recheck her level 3 months after the change.       Insulin resistance     Tolerating Saxenda. Continue to minimize carbs and monitor      Sleep apnea in adult    Recent sleep study confirmed sleep apnea and she is tolerating her CPAP says it makes her much less tired.          I am having Hailey Noble maintain her Calcium Carbonate-Vitamin D (CALTRATE 600+D PO), Krill Oil, Soothe XP, vitamin C, multivitamin with minerals, acetaminophen, escitalopram, mometasone, TechLite Pen Needles, metFORMIN, Vitamin D (Ergocalciferol), and Saxenda.  No orders of the defined types were placed in this encounter.    Penni Homans, MD

## 2020-12-26 NOTE — Assessment & Plan Note (Signed)
Recent sleep study confirmed sleep apnea and she is tolerating her CPAP says it makes her much less tired.

## 2020-12-26 NOTE — Assessment & Plan Note (Signed)
Is working with healthy weight and wellness and tolerating Saxenda at 2.4 dosing. At 3.0 she had no appetite at all. She notes Wellbutrin helped with appetite suppression best but dried her out too much at 150 mg dose. Will try Wellbutrin 75 mg tabs, 1/2 to 1 tab daily as tolerated and reassess in 3 months or as needed.

## 2020-12-28 DIAGNOSIS — G4733 Obstructive sleep apnea (adult) (pediatric): Secondary | ICD-10-CM | POA: Diagnosis not present

## 2020-12-29 ENCOUNTER — Encounter: Payer: 59 | Attending: Family Medicine | Admitting: Registered"

## 2020-12-29 DIAGNOSIS — Z713 Dietary counseling and surveillance: Secondary | ICD-10-CM | POA: Insufficient documentation

## 2021-01-03 ENCOUNTER — Other Ambulatory Visit (INDEPENDENT_AMBULATORY_CARE_PROVIDER_SITE_OTHER): Payer: Self-pay | Admitting: Physician Assistant

## 2021-01-03 ENCOUNTER — Ambulatory Visit (INDEPENDENT_AMBULATORY_CARE_PROVIDER_SITE_OTHER): Payer: 59 | Admitting: Physician Assistant

## 2021-01-03 ENCOUNTER — Encounter (INDEPENDENT_AMBULATORY_CARE_PROVIDER_SITE_OTHER): Payer: Self-pay | Admitting: Physician Assistant

## 2021-01-03 ENCOUNTER — Other Ambulatory Visit: Payer: Self-pay

## 2021-01-03 VITALS — BP 133/76 | HR 87 | Temp 98.5°F | Ht 65.0 in | Wt 212.0 lb

## 2021-01-03 DIAGNOSIS — E559 Vitamin D deficiency, unspecified: Secondary | ICD-10-CM | POA: Diagnosis not present

## 2021-01-03 DIAGNOSIS — R7303 Prediabetes: Secondary | ICD-10-CM | POA: Diagnosis not present

## 2021-01-03 DIAGNOSIS — Z6835 Body mass index (BMI) 35.0-35.9, adult: Secondary | ICD-10-CM

## 2021-01-03 DIAGNOSIS — F3289 Other specified depressive episodes: Secondary | ICD-10-CM

## 2021-01-03 DIAGNOSIS — Z9189 Other specified personal risk factors, not elsewhere classified: Secondary | ICD-10-CM | POA: Diagnosis not present

## 2021-01-03 DIAGNOSIS — E7849 Other hyperlipidemia: Secondary | ICD-10-CM | POA: Diagnosis not present

## 2021-01-03 MED ORDER — VITAMIN D (ERGOCALCIFEROL) 1.25 MG (50000 UNIT) PO CAPS: 50000.0000 [IU] | ORAL_CAPSULE | ORAL | 0 refills | Status: DC

## 2021-01-03 MED FILL — VIT D2 1.25 MG (50,000 UNIT: 1.25 MG | 28 days supply | Qty: 4 | Fill #0

## 2021-01-04 NOTE — Progress Notes (Signed)
Chief Complaint:   OBESITY Lyrical is here to discuss her progress with her obesity treatment plan along with follow-up of her obesity related diagnoses. Kenyatte is on the Category 3 Plan and keeping a food journal and adhering to recommended goals of 450-600 calories and 40 grams of protein at supper daily and states she is following her eating plan approximately 90% of the time. Abbeygail states she is swimming and walking for 45 minutes 3 times per week.  Today's visit was #: 61 Starting weight: 212 lbs Starting date: 10/06/2017 Today's weight: 212 lbs Today's date: 01/03/2021 Total lbs lost to date: 0 Total lbs lost since last in-office visit: 2  Interim History: Edla is on Saxenda 2.4 mg and she is tolerating it well. She states that it is controlling her hunger and she is eating all of the food on the plan. She starts a fitness program at the gym today.  Subjective:   1. Vitamin D deficiency Inas is on Vit D, and she denies nausea, vomiting, or muscle weakness.  2. Pre-diabetes Makynlie is on metformin, and she denies nausea, vomiting, or diarrhea. She denies polyphagia or hypoglycemia.  3. Other hyperlipidemia Stephenia is on krill oil, but no other medications. She denies chest pain. She will start a exercise program today.  4. Other depression, with emotional eating Dessa's primary care physician just prescribed Wellbutrin 75 mg. She previously was on 150 mg but she did not tolerate it.  5. At risk for diabetes mellitus Mahika is at higher than average risk for developing diabetes due to obesity.   Assessment/Plan:   1. Vitamin D deficiency Low Vitamin D level contributes to fatigue and are associated with obesity, breast, and colon cancer. We will check labs today, and we will refill prescription Vitamin D for 1 month. Lorre will follow-up for routine testing of Vitamin D, at least 2-3 times per year to avoid over-replacement.  - VITAMIN D 25 Hydroxy (Vit-D  Deficiency, Fractures) - Vitamin D, Ergocalciferol, (DRISDOL) 1.25 MG (50000 UNIT) CAPS capsule; Take 1 capsule (50,000 Units total) by mouth every 7 (seven) days.  Dispense: 4 capsule; Refill: 0  2. Pre-diabetes Jasmyne will continue her medications, and will continue to work on weight loss, exercise, and decreasing simple carbohydrates to help decrease the risk of diabetes. We will check labs today.  - Hemoglobin A1c - Insulin, random  3. Other hyperlipidemia Cardiovascular risk and specific lipid/LDL goals reviewed. We discussed several lifestyle modifications today. Jeannene will continue her meal plan, and will increase exercise and weight loss efforts. Orders and follow up as documented in patient record.   Counseling Intensive lifestyle modifications are the first line treatment for this issue. . Dietary changes: Increase soluble fiber. Decrease simple carbohydrates. . Exercise changes: Moderate to vigorous-intensity aerobic activity 150 minutes per week if tolerated. . Lipid-lowering medications: see documented in medical record.  - Lipid panel - Comprehensive metabolic panel  4. Other depression, with emotional eating Behavior modification techniques were discussed today to help Phylicia deal with her emotional/non-hunger eating behaviors. Deirdra agreed to start Wellbutrin from her primary care physician. Orders and follow up as documented in patient record.   5. At risk for diabetes mellitus Cresencia was given approximately 15 minutes of diabetes education and counseling today. We discussed intensive lifestyle modifications today with an emphasis on weight loss as well as increasing exercise and decreasing simple carbohydrates in her diet. We also reviewed medication options with an emphasis on risk versus benefit of  those discussed.   Repetitive spaced learning was employed today to elicit superior memory formation and behavioral change.  6. Class 2 severe obesity with serious  comorbidity and body mass index (BMI) of 35.0 to 35.9 in adult, unspecified obesity type (HCC) Aleatha is currently in the action stage of change. As such, her goal is to continue with weight loss efforts. She has agreed to the Category 3 Plan and keeping a food journal and adhering to recommended goals of 450-600 calories and 40 grams of protein at supper daily.   We discussed various medication options to help Mckinzee with her weight loss efforts and we both agreed to continue Saxenda and we will refill for 1 month, and we will refill pen needles #100 for 1 month..  Exercise goals: As is.  Behavioral modification strategies: meal planning and cooking strategies.  Adrianna has agreed to follow-up with our clinic in 2 weeks. She was informed of the importance of frequent follow-up visits to maximize her success with intensive lifestyle modifications for her multiple health conditions.   Shamonica was informed we would discuss her lab results at her next visit unless there is a critical issue that needs to be addressed sooner. Avalina agreed to keep her next visit at the agreed upon time to discuss these results.  Objective:   Blood pressure 133/76, pulse 87, temperature 98.5 F (36.9 C), height 5\' 5"  (1.651 m), weight 212 lb (96.2 kg), last menstrual period 11/22/2018, SpO2 97 %. Body mass index is 35.28 kg/m.  General: Cooperative, alert, well developed, in no acute distress. HEENT: Conjunctivae and lids unremarkable. Cardiovascular: Regular rhythm.  Lungs: Normal work of breathing. Neurologic: No focal deficits.   Lab Results  Component Value Date   CREATININE 0.63 10/12/2020   BUN 17 10/12/2020   NA 141 10/12/2020   K 4.5 10/12/2020   CL 103 10/12/2020   CO2 26 10/12/2020   Lab Results  Component Value Date   ALT 19 10/12/2020   AST 15 10/12/2020   ALKPHOS 144 (H) 10/12/2020   BILITOT 0.3 10/12/2020   Lab Results  Component Value Date   HGBA1C 5.5 10/12/2020   HGBA1C 5.4  12/02/2019   HGBA1C 5.3 10/08/2019   HGBA1C 5.4 06/16/2019   HGBA1C 5.4 12/30/2018   Lab Results  Component Value Date   INSULIN 10.5 10/12/2020   INSULIN 10.1 04/10/2020   INSULIN 9.3 12/30/2018   INSULIN 11.1 09/21/2018   INSULIN 8.7 04/30/2018   Lab Results  Component Value Date   TSH 1.47 06/01/2020   Lab Results  Component Value Date   CHOL 197 10/12/2020   HDL 49 10/12/2020   LDLCALC 128 (H) 10/12/2020   TRIG 113 10/12/2020   CHOLHDL 4.0 10/12/2020   Lab Results  Component Value Date   WBC 7.7 06/01/2020   HGB 14.0 06/01/2020   HCT 41.7 06/01/2020   MCV 89.0 06/01/2020   PLT 349.0 06/01/2020   Lab Results  Component Value Date   IRON 94 06/01/2020   TIBC 363 06/01/2020   FERRITIN 36 06/01/2020   Attestation Statements:   Reviewed by clinician on day of visit: allergies, medications, problem list, medical history, surgical history, family history, social history, and previous encounter notes.   Wilhemena Durie, am acting as transcriptionist for [employer], PA-C.  I have reviewed the above documentation for accuracy and completeness, and I agree with the above. Abby Potash, PA-C

## 2021-01-05 ENCOUNTER — Other Ambulatory Visit (INDEPENDENT_AMBULATORY_CARE_PROVIDER_SITE_OTHER): Payer: Self-pay | Admitting: Physician Assistant

## 2021-01-05 ENCOUNTER — Encounter: Payer: 59 | Admitting: Registered"

## 2021-01-05 MED ORDER — SAXENDA 18 MG/3ML ~~LOC~~ SOPN: 3.0000 mg | PEN_INJECTOR | Freq: Every day | SUBCUTANEOUS | 0 refills | Status: DC

## 2021-01-05 MED ORDER — TECHLITE PEN NEEDLES 32G X 6 MM MISC: 0 refills | Status: DC

## 2021-01-15 ENCOUNTER — Encounter (INDEPENDENT_AMBULATORY_CARE_PROVIDER_SITE_OTHER): Payer: Self-pay | Admitting: Physician Assistant

## 2021-01-19 ENCOUNTER — Inpatient Hospital Stay: Payer: 59

## 2021-01-19 ENCOUNTER — Other Ambulatory Visit: Payer: Self-pay

## 2021-01-22 ENCOUNTER — Telehealth (INDEPENDENT_AMBULATORY_CARE_PROVIDER_SITE_OTHER): Payer: Self-pay

## 2021-01-22 ENCOUNTER — Encounter (INDEPENDENT_AMBULATORY_CARE_PROVIDER_SITE_OTHER): Payer: Self-pay

## 2021-01-22 NOTE — Telephone Encounter (Signed)
Appeal for Hailey Noble has been denied by [employer]. The reason for denial is cited as being because the pt has seen no weight loss since prior to starting Saxenda. Pt's weight has increased.

## 2021-01-22 NOTE — Telephone Encounter (Signed)
An Appeal has been initiated via [email or web address] for Saxenda.  [place] (Key: [id number]) Kirke Shaggy 18MG Fayne Mediate pen-injectors   Form Letter of Consent Drug Appeal Form Appeal Created 4 days ago Sent to Plan less than a minute ago Determination Wait for Determination Please wait for the payer to return a determination.

## 2021-01-23 ENCOUNTER — Encounter (INDEPENDENT_AMBULATORY_CARE_PROVIDER_SITE_OTHER): Payer: Self-pay

## 2021-01-24 ENCOUNTER — Other Ambulatory Visit (INDEPENDENT_AMBULATORY_CARE_PROVIDER_SITE_OTHER): Payer: Self-pay | Admitting: Physician Assistant

## 2021-01-24 ENCOUNTER — Other Ambulatory Visit: Payer: Self-pay

## 2021-01-24 ENCOUNTER — Encounter (INDEPENDENT_AMBULATORY_CARE_PROVIDER_SITE_OTHER): Payer: Self-pay | Admitting: Physician Assistant

## 2021-01-24 ENCOUNTER — Ambulatory Visit (INDEPENDENT_AMBULATORY_CARE_PROVIDER_SITE_OTHER): Payer: 59 | Admitting: Physician Assistant

## 2021-01-24 VITALS — BP 117/73 | HR 80 | Temp 97.6°F | Ht 65.0 in | Wt 211.0 lb

## 2021-01-24 DIAGNOSIS — E7849 Other hyperlipidemia: Secondary | ICD-10-CM

## 2021-01-24 DIAGNOSIS — Z9189 Other specified personal risk factors, not elsewhere classified: Secondary | ICD-10-CM

## 2021-01-24 DIAGNOSIS — R5383 Other fatigue: Secondary | ICD-10-CM | POA: Diagnosis not present

## 2021-01-24 DIAGNOSIS — Z6835 Body mass index (BMI) 35.0-35.9, adult: Secondary | ICD-10-CM | POA: Diagnosis not present

## 2021-01-24 DIAGNOSIS — R7303 Prediabetes: Secondary | ICD-10-CM | POA: Diagnosis not present

## 2021-01-24 DIAGNOSIS — E559 Vitamin D deficiency, unspecified: Secondary | ICD-10-CM | POA: Diagnosis not present

## 2021-01-24 MED ORDER — VITAMIN D (ERGOCALCIFEROL) 1.25 MG (50000 UNIT) PO CAPS: 50000.0000 [IU] | ORAL_CAPSULE | ORAL | 0 refills | Status: DC

## 2021-01-25 ENCOUNTER — Other Ambulatory Visit (INDEPENDENT_AMBULATORY_CARE_PROVIDER_SITE_OTHER): Payer: Self-pay | Admitting: Physician Assistant

## 2021-01-25 LAB — HEMOGLOBIN A1C
Est. average glucose Bld gHb Est-mCnc: 111 mg/dL
Hgb A1c MFr Bld: 5.5 % (ref 4.8–5.6)

## 2021-01-25 LAB — LIPID PANEL
Chol/HDL Ratio: 4.3 ratio (ref 0.0–4.4)
Cholesterol, Total: 184 mg/dL (ref 100–199)
HDL: 43 mg/dL (ref >39)
LDL Chol Calc (NIH): 107 mg/dL — ABNORMAL HIGH (ref 0–99)
Triglycerides: 198 mg/dL — ABNORMAL HIGH (ref 0–149)
VLDL Cholesterol Cal: 34 mg/dL (ref 5–40)

## 2021-01-25 LAB — THYROID PANEL WITH TSH
Free Thyroxine Index: 1.4 (ref 1.2–4.9)
T3 Uptake Ratio: 24 % (ref 24–39)
T4, Total: 5.9 ug/dL (ref 4.5–12.0)
TSH: 1.67 u[IU]/mL (ref 0.450–4.500)

## 2021-01-25 LAB — VITAMIN D 25 HYDROXY (VIT D DEFICIENCY, FRACTURES): Vit D, 25-Hydroxy: 46 ng/mL (ref 30.0–100.0)

## 2021-01-25 LAB — COMPREHENSIVE METABOLIC PANEL
ALT: 17 IU/L (ref 0–32)
AST: 14 IU/L (ref 0–40)
Albumin/Globulin Ratio: 1.7 (ref 1.2–2.2)
Albumin: 4.3 g/dL (ref 3.8–4.9)
Alkaline Phosphatase: 140 IU/L — ABNORMAL HIGH (ref 44–121)
BUN/Creatinine Ratio: 31 — ABNORMAL HIGH (ref 9–23)
BUN: 16 mg/dL (ref 6–24)
Bilirubin Total: 0.2 mg/dL (ref 0.0–1.2)
CO2: 23 mmol/L (ref 20–29)
Calcium: 9.3 mg/dL (ref 8.7–10.2)
Chloride: 105 mmol/L (ref 96–106)
Creatinine, Ser: 0.52 mg/dL — ABNORMAL LOW (ref 0.57–1.00)
GFR calc Af Amer: 124 mL/min/{1.73_m2} (ref >59)
GFR calc non Af Amer: 108 mL/min/{1.73_m2} (ref >59)
Globulin, Total: 2.5 g/dL (ref 1.5–4.5)
Glucose: 96 mg/dL (ref 65–99)
Potassium: 4.7 mmol/L (ref 3.5–5.2)
Sodium: 143 mmol/L (ref 134–144)
Total Protein: 6.8 g/dL (ref 6.0–8.5)

## 2021-01-25 LAB — INSULIN, RANDOM: INSULIN: 14.9 u[IU]/mL (ref 2.6–24.9)

## 2021-01-25 MED ORDER — METFORMIN HCL ER 500 MG PO TB24: 500.0000 mg | ORAL_TABLET | Freq: Two times a day (BID) | ORAL | 0 refills | Status: DC

## 2021-01-25 MED FILL — VIT D2 1.25 MG (50,000 UNIT: 1.25 MG | 28 days supply | Qty: 4 | Fill #0

## 2021-01-25 MED FILL — metFORMIN HCL ER 500 MG TB2: 500 | 30 days supply | Qty: 60 | Fill #0

## 2021-01-25 NOTE — Progress Notes (Signed)
Chief Complaint:   OBESITY Hailey Noble is here to discuss her progress with her obesity treatment plan along with follow-up of her obesity related diagnoses. Hailey Noble is on the Category 3 Plan and keeping a food journal and adhering to recommended goals of 450-600 calories and 40 grams of protein at supper daily and states she is following her eating plan approximately 90% of the time. [name] states she is doing cardio and weights for 30-60 minutes 3 times per week.  Today's visit was #: 46 Starting weight: 212 lbs Starting date: 10/06/2017 Today's weight: 211 lbs Today's date: 01/24/2021 Total lbs lost to date: 1 Total lbs lost since last in-office visit: 1  Interim History: Hailey Noble has not taken [name] the last 10 days due to lack of approval from her insurance. She feels like her appetite is still controlled off of the medicine. She is working out with a trainer 3 times per week.  Subjective:   1. Vitamin D deficiency [name] denies nausea, vomiting, or muscle weakness on Vit D. Last Vit D level was at goal.  2. Pre-diabetes [name] is on metformin once daily in the morning. If she takes it BID, she gets diarrhea.  3. Other hyperlipidemia [name] is not on medications, and she denies chest pain. She is exercising regularly.  4. Other fatigue Laray denies shortness of breath or dizziness. Fatigue is noted daily, and she also notes dry skin.  5. At risk for heart disease [name] is at a higher than average risk for cardiovascular disease due to obesity.   Assessment/Plan:   1. Vitamin D deficiency Low Vitamin D level contributes to fatigue and are associated with obesity, breast, and colon cancer. We will check labs today, and we will refill prescription Vitamin D for 1 month. Hudson will follow-up for routine testing of Vitamin D, at least 2-3 times per year to avoid over-replacement.  - VITAMIN D 25 Hydroxy (Vit-D Deficiency, Fractures) - Vitamin D, Ergocalciferol, (DRISDOL) 1.25  MG (50000 UNIT) CAPS capsule; Take 1 capsule (50,000 Units total) by mouth every 7 (seven) days.  Dispense: 4 capsule; Refill: 0  2. Pre-diabetes Hailey Noble will continue to work on weight loss, exercise, and decreasing simple carbohydrates to help decrease the risk of diabetes. We will check labs today. Hailey Noble agreed to discontinue metformin (regular), and she will start metformin XR 500 mg BID #60 with no refills.   - Hemoglobin A1c - Insulin, random - Comprehensive metabolic panel  3. Other hyperlipidemia Cardiovascular risk and specific lipid/LDL goals reviewed. We discussed several lifestyle modifications today. [name] will continue her meal plan, and will continue to work on exercise and weight loss efforts. We will check labs today. Orders and follow up as documented in patient record.   Counseling Intensive lifestyle modifications are the first line treatment for this issue. . Dietary changes: Increase soluble fiber. Decrease simple carbohydrates. . Exercise changes: Moderate to vigorous-intensity aerobic activity 150 minutes per week if tolerated. . Lipid-lowering medications: see documented in medical record.  - Lipid panel  4. Other fatigue We will check labs today and in the meanwhile, Hailey Noble will focus on self care including making healthy food choices, increasing physical activity and focusing on stress reduction.  - Thyroid Panel With TSH  5. At risk for heart disease Hailey Noble was given approximately 15 minutes of coronary artery disease prevention counseling today. She is 55 y.o. female and has risk factors for heart disease including obesity. We discussed intensive lifestyle modifications today with an emphasis  on specific weight loss instructions and strategies.   Repetitive spaced learning was employed today to elicit superior memory formation and behavioral change.  6. Class 2 severe obesity with serious comorbidity and body mass index (BMI) of 35.0 to 35.9 in adult,  unspecified obesity type (HCC) Hailey Noble is currently in the action stage of change. As such, her goal is to continue with weight loss efforts. She has agreed to the Category 3 Plan.   Exercise goals: As is.  Behavioral modification strategies: meal planning and cooking strategies and keeping healthy foods in the home.  Hailey Noble has agreed to follow-up with our clinic in 2 weeks. She was informed of the importance of frequent follow-up visits to maximize her success with intensive lifestyle modifications for her multiple health conditions.   Hailey Noble was informed we would discuss her lab results at her next visit unless there is a critical issue that needs to be addressed sooner. Hailey Noble agreed to keep her next visit at the agreed upon time to discuss these results.  Objective:   Blood pressure 117/73, pulse 80, temperature 97.6 F (36.4 C), height 5\' 5"  (1.651 m), weight 211 lb (95.7 kg), last menstrual period 11/22/2018, SpO2 97 %. Body mass index is 35.11 kg/m.  General: Cooperative, alert, well developed, in no acute distress. HEENT: Conjunctivae and lids unremarkable. Cardiovascular: Regular rhythm.  Lungs: Normal work of breathing. Neurologic: No focal deficits.   Lab Results  Component Value Date   CREATININE 0.52 (L) 01/24/2021   BUN 16 01/24/2021   NA 143 01/24/2021   K 4.7 01/24/2021   CL 105 01/24/2021   CO2 23 01/24/2021   Lab Results  Component Value Date   ALT 17 01/24/2021   AST 14 01/24/2021   ALKPHOS 140 (H) 01/24/2021   BILITOT 0.2 01/24/2021   Lab Results  Component Value Date   HGBA1C 5.5 01/24/2021   HGBA1C 5.5 10/12/2020   HGBA1C 5.4 12/02/2019   HGBA1C 5.3 10/08/2019   HGBA1C 5.4 06/16/2019   Lab Results  Component Value Date   INSULIN WILL FOLLOW 01/24/2021   INSULIN 10.5 10/12/2020   INSULIN 10.1 04/10/2020   INSULIN 9.3 12/30/2018   INSULIN 11.1 09/21/2018   Lab Results  Component Value Date   TSH 1.670 01/24/2021   Lab Results  Component  Value Date   CHOL 184 01/24/2021   HDL 43 01/24/2021   LDLCALC 107 (H) 01/24/2021   TRIG 198 (H) 01/24/2021   CHOLHDL 4.3 01/24/2021   Lab Results  Component Value Date   WBC 7.7 06/01/2020   HGB 14.0 06/01/2020   HCT 41.7 06/01/2020   MCV 89.0 06/01/2020   PLT 349.0 06/01/2020   Lab Results  Component Value Date   IRON 94 06/01/2020   TIBC 363 06/01/2020   FERRITIN 36 06/01/2020   Attestation Statements:   Reviewed by clinician on day of visit: allergies, medications, problem list, medical history, surgical history, family history, social history, and previous encounter notes.   Wilhemena Durie, am acting as transcriptionist for [employer], PA-C.  I have reviewed the above documentation for accuracy and completeness, and I agree with the above. Abby Potash, PA-C

## 2021-01-30 ENCOUNTER — Ambulatory Visit: Payer: 59 | Admitting: Allergy

## 2021-01-30 ENCOUNTER — Other Ambulatory Visit: Payer: Self-pay

## 2021-01-30 ENCOUNTER — Encounter: Payer: Self-pay | Admitting: Allergy

## 2021-01-30 ENCOUNTER — Other Ambulatory Visit (HOSPITAL_BASED_OUTPATIENT_CLINIC_OR_DEPARTMENT_OTHER): Payer: Self-pay | Admitting: Allergy

## 2021-01-30 VITALS — BP 130/82 | HR 81 | Temp 96.6°F | Resp 17 | Ht 65.83 in | Wt 218.0 lb

## 2021-01-30 DIAGNOSIS — T781XXD Other adverse food reactions, not elsewhere classified, subsequent encounter: Secondary | ICD-10-CM

## 2021-01-30 DIAGNOSIS — J310 Chronic rhinitis: Secondary | ICD-10-CM | POA: Insufficient documentation

## 2021-01-30 DIAGNOSIS — R21 Rash and other nonspecific skin eruption: Secondary | ICD-10-CM | POA: Diagnosis not present

## 2021-01-30 DIAGNOSIS — L989 Disorder of the skin and subcutaneous tissue, unspecified: Secondary | ICD-10-CM | POA: Insufficient documentation

## 2021-01-30 DIAGNOSIS — T7819XD Other adverse food reactions, not elsewhere classified, subsequent encounter: Secondary | ICD-10-CM | POA: Insufficient documentation

## 2021-01-30 DIAGNOSIS — L299 Pruritus, unspecified: Secondary | ICD-10-CM | POA: Diagnosis not present

## 2021-01-30 MED ORDER — AZELASTINE HCL 0.1 % NA SOLN: 1.0000 | Freq: Two times a day (BID) | NASAL | 5 refills | Status: DC | PRN

## 2021-01-30 MED FILL — AZELASTINE HCL 137 MCG/SPRA: 137 | 25 days supply | Qty: 30 | Fill #0

## 2021-01-30 NOTE — Assessment & Plan Note (Signed)
Broke out in hives after drinking a mango/coconut tea. Tolerated previously with no issues. Not avoiding any other foods.  Today's skin testing showed: negative to coconut.  Continue to avoid mango and coconut for now.  No skin testing for mango available today. The patients history suggests mango or coconut allergy, though todays skin tests were negative despite a positive histamine control.  Food allergen skin testing has excellent negative predictive value however there is still a small chance that the allergy exists. If interested, we can order bloodwork for mango/coconut next - patient declines and will continue to avoid these foods.   For mild symptoms you can take over the counter antihistamines such as Benadryl and monitor symptoms closely. If symptoms worsen or if you have severe symptoms including breathing issues, throat closure, significant swelling, whole body hives, severe diarrhea and vomiting, lightheadedness then seek immediate medical care.

## 2021-01-30 NOTE — Assessment & Plan Note (Addendum)
Noticed pruritic rash starting 1 year ago. Occurs every few months lasting 3-4 days at a time mainly on the torso area. No specific triggers noted but concerned whether developing any new food/environmental allergies. Tried benadryl and OTC hydrocortisone with good benefit. Denies any changes in diet, personal care products. Some changes in medications with no worsening symptoms. CBC diff, TSH, CMP unremarkable - slightly elevated AP.  Today's skin testing showed: Negative to indoor/outdoor allergens and coconut.   Discussed with patient that given above clinical presentation, her symptoms are not likely to be caused by environmental/food allergies.   Gave handout on proper skin care.   Take pictures during flares.   May use over the counter antihistamines such as Zyrtec (cetirizine), Claritin (loratadine), Allegra (fexofenadine), or Xyzal (levocetirizine) daily as needed for the itching.  If no improvement, recommend patch testing next to rule out contact dermatitis.

## 2021-01-30 NOTE — Patient Instructions (Addendum)
Today's skin testing showed: Negative to indoor/outdoor allergens and coconut.   Rhinitis:   Continue Nasonex 1 spray per nostril 1-2 times a day for nasal congestion/ear fullness.   May use azelastine nasal spray 1-2 sprays per nostril twice a day as needed for runny nose/drainage.  If you notice worsening symptoms, then recommend doing the intradermal testing or bloodwork for environmental allergies next.   Food  Continue to avoid mango and coconut for now.  No skin testing for mango available today. The patients history suggests mango or coconut allergy, though todays skin tests were negative despite a positive histamine control.  Food allergen skin testing has excellent negative predictive value however there is still a small chance that the allergy exists. If interested, we can order bloodwork for mango/coconut next.   For mild symptoms you can take over the counter antihistamines such as Benadryl and monitor symptoms closely. If symptoms worsen or if you have severe symptoms including breathing issues, throat closure, significant swelling, whole body hives, severe diarrhea and vomiting, lightheadedness then seek immediate medical care.  Skin:  See below for proper skin care.   I don't think it's caused by any food allergies or environmental allergies.  Take pictures if it flares.  May use over the counter antihistamines such as Zyrtec (cetirizine), Claritin (loratadine), Allegra (fexofenadine), or Xyzal (levocetirizine) daily as needed for the itching.   If no improvement, recommend patch testing next.   Patches are best placed on Monday with return to office on Wednesday and Friday of same week for readings.  Patches once placed should not get wet.  You do not have to stop any medications for patch testing but should not be on oral prednisone. You can schedule a patch testing visit when convenient for your schedule.   True Test looks for the following sensitivities:       Follow up if needed.     Skin care recommendations  Bath time: . Always use lukewarm water. AVOID very hot or cold water. Marland Kitchen Keep bathing time to 5-10 minutes. . Do NOT use bubble bath. . Use a mild soap and use just enough to wash the dirty areas. . Do NOT scrub skin vigorously.  . After bathing, pat dry your skin with a towel. Do NOT rub or scrub the skin.  Moisturizers and prescriptions:  . ALWAYS apply moisturizers immediately after bathing (within 3 minutes). This helps to lock-in moisture. . Use the moisturizer several times a day over the whole body. Kermit Balo summer moisturizers include: Aveeno, CeraVe, Cetaphil. Kermit Balo winter moisturizers include: Aquaphor, Vaseline, Cerave, Cetaphil, Eucerin, Vanicream. . When using moisturizers along with medications, the moisturizer should be applied about one hour after applying the medication to prevent diluting effect of the medication or moisturize around where you applied the medications. When not using medications, the moisturizer can be continued twice daily as maintenance.  Laundry and clothing: . Avoid laundry products with added color or perfumes. . Use unscented hypo-allergenic laundry products such as Tide free, Cheer free & gentle, and All free and clear.  . If the skin still seems dry or sensitive, you can try double-rinsing the clothes. . Avoid tight or scratchy clothing such as wool. . Do not use fabric softeners or dyer sheets.

## 2021-01-30 NOTE — Assessment & Plan Note (Signed)
Perennial rhinitis symptoms for 40+ years with worsening in the spring. Takes zyrtec and Nasonex with good benefit. On AIT for 2-3 years with some benefit. Sinus surgery in 2002.  Today's skin testing showed: Negative to indoor/outdoor allergens.  Continue Nasonex 1 spray per nostril 1-2 times a day for nasal congestion/ear fullness.   May use azelastine nasal spray 1-2 sprays per nostril twice a day as needed for runny nose/drainage.  If you notice worsening symptoms, then recommend doing the intradermal testing or bloodwork for environmental allergies next.

## 2021-01-30 NOTE — Progress Notes (Signed)
New Patient Note  RE: AREN CHERNE MRN: [id number] DOB: [birth year] Date of Office Visit: 01/30/2021  Referring provider: No ref. provider found Primary care provider: Mosie Lukes, MD  Chief Complaint: Allergy Testing  History of Present Illness: I had the pleasure of seeing [facility] for initial evaluation at the [facility] [place] [facility] [place] on 01/30/2021. She is a 55 y.o. female, who is referred here by Mosie Lukes, MD for the evaluation of allergies.  Rash: Rash started about 1 year ago. Mainly occurs on her back area. Describes them as pruritic, flat, pinkish hue. Individual rashes lasts about 3-4 days. This usually occurs every 3-4 months. No ecchymosis upon resolution. Associated symptoms include: none. Suspected triggers are certain personal care products, mango/coconut tea. Denies any fevers, chills or recent infections. She has tried the following therapies: OTC hydrocortisone, benadryl with good benefit. Systemic steroids no. Currently on no daily medications.  Previous work up includes: 01/24/21 - TSH (normal), CMP (AP slightly elevated), 2021 CBC diff normal. Previous history of rash/hives: no. Patient is up to date with the following cancer screening tests: mammogram, colonoscopy.  Patient was started on Glucophage 500mg  2 years ago and last week she started on Glucophage XR - no reactions with the change. She was also on some type of weight loss medication which she is no longer taking. She is taking some multivitamins since Covid-19 pandemic with no change in symptoms.   Food: Currently avoiding mango, coconut due to breaking out in hives. Previously tolerated without any issues.  Past work up includes: skin testing in the 1990s was positive to strawberries and mustard. Currently tolerates these foods without any issues.  Dietary History: patient has been eating other foods including milk, eggs, peanut, treenuts, sesame, shellfish, fish, soy, wheat, meats, fruits  and vegetables.  Rhinitis: She reports symptoms of rhinorrhea, watery eyes, nasal congestion, sneezing. Symptoms have been going on for 40+ years. The symptoms are present all year around with worsening in spring. Anosmia: diminished sense of smell. Headache: sometimes. She has used zyrtec, artificial tears, Nasonex 1 spray per nostril 1-2 times per day with fair improvement in symptoms. Claritin not effective. Sinus infections: none in the past year. Previous work up includes: not sure of results and was on allergy injections for 2-3 years with some benefit.  Previous ENT evaluation: sinus surgery in 2002 for deviated septum and frequent sinus infections.  History of nasal polyps: no. Last eye exam: last year. History of reflux: no. Recently started on CPAP.  Assessment and Plan: Aemilia is a 55 y.o. female with: Rash and other nonspecific skin eruption Noticed pruritic rash starting 1 year ago. Occurs every few months lasting 3-4 days at a time mainly on the torso area. No specific triggers noted but concerned whether developing any new food/environmental allergies. Tried benadryl and OTC hydrocortisone with good benefit. Denies any changes in diet, personal care products. Some changes in medications with no worsening symptoms. CBC diff, TSH, CMP unremarkable - slightly elevated AP.  Today's skin testing showed: Negative to indoor/outdoor allergens and coconut.   Discussed with patient that given above clinical presentation, her symptoms are not likely to be caused by environmental/food allergies.   Gave handout on proper skin care.   Take pictures during flares.   May use over the counter antihistamines such as Zyrtec (cetirizine), Claritin (loratadine), Allegra (fexofenadine), or Xyzal (levocetirizine) daily as needed for the itching.  If no improvement, recommend patch testing next to rule out  contact dermatitis.   Adverse reaction to food, subsequent encounter Broke out in hives  after drinking a mango/coconut tea. Tolerated previously with no issues. Not avoiding any other foods.  Today's skin testing showed: negative to coconut.  Continue to avoid mango and coconut for now.  No skin testing for mango available today. The patients history suggests mango or coconut allergy, though todays skin tests were negative despite a positive histamine control.  Food allergen skin testing has excellent negative predictive value however there is still a small chance that the allergy exists. If interested, we can order bloodwork for mango/coconut next - patient declines and will continue to avoid these foods.   For mild symptoms you can take over the counter antihistamines such as Benadryl and monitor symptoms closely. If symptoms worsen or if you have severe symptoms including breathing issues, throat closure, significant swelling, whole body hives, severe diarrhea and vomiting, lightheadedness then seek immediate medical care.  Other rhinitis Perennial rhinitis symptoms for 40+ years with worsening in the spring. Takes zyrtec and Nasonex with good benefit. On AIT for 2-3 years with some benefit. Sinus surgery in 2002.  Today's skin testing showed: Negative to indoor/outdoor allergens.  Continue Nasonex 1 spray per nostril 1-2 times a day for nasal congestion/ear fullness.   May use azelastine nasal spray 1-2 sprays per nostril twice a day as needed for runny nose/drainage.  If you notice worsening symptoms, then recommend doing the intradermal testing or bloodwork for environmental allergies next.   Return if symptoms worsen or fail to improve.  Meds ordered this encounter  Medications  . azelastine (ASTELIN) 0.1 % nasal spray    Sig: Place 1-2 sprays into both nostrils 2 (two) times daily as needed (nasal drainage). Use in each nostril as directed    Dispense:  30 mL    Refill:  5   Lab Orders  No laboratory test(s) ordered today    Other allergy  screening: Asthma: no Medication allergy: yes Hymenoptera allergy: no History of recurrent infections suggestive of immunodeficency: no  Diagnostics: Skin Testing: Environmental allergy panel and select foods. Negative to indoor/outdoor allergens and coconut.  Results discussed with patient/family.  Airborne Adult Perc - 01/30/21 0916    Time Antigen Placed 7948    Allergen Manufacturer Lavella Hammock    Location Back    Number of Test 59    Panel 1 Select    1. Control-Buffer 50% Glycerol Negative    2. Control-Histamine 1 mg/ml 2+    3. Albumin saline Negative    4. Alberta Negative    5. Guatemala Negative    6. Johnson Negative    7. Eden Blue Negative    8. Meadow Fescue Negative    9. Perennial Rye Negative    10. Sweet Vernal Negative    11. Timothy Negative    12. Cocklebur Negative    13. Burweed Marshelder Negative    14. Ragweed, short Negative    15. Ragweed, Giant Negative    16. Plantain,  English Negative    17. Lamb's Quarters Negative    18. Sheep Sorrell Negative    19. Rough Pigweed Negative    20. Marsh Elder, Rough Negative    21. Mugwort, Common Negative    22. Ash mix Negative    23. Birch mix Negative    24. Beech American Negative    25. Box, Elder Negative    26. Cedar, red Negative    27. Cottonwood, [place] Negative  28. Elm mix Negative    29. Hickory Negative    30. Maple mix Negative    31. Oak, [place] mix Negative    32. Pecan Pollen Negative    33. Pine mix Negative    34. Sycamore Eastern Negative    35. Borger, Black Pollen Negative    36. Alternaria alternata Negative    37. Cladosporium Herbarum Negative    38. Aspergillus mix Negative    39. Penicillium mix Negative    40. Bipolaris sorokiniana (Helminthosporium) Negative    41. Drechslera spicifera (Curvularia) Negative    42. Mucor plumbeus Negative    43. Fusarium moniliforme Negative    44. Aureobasidium pullulans (pullulara) Negative    45. Rhizopus oryzae Negative     46. Botrytis cinera Negative    47. Epicoccum nigrum Negative    48. Phoma betae Negative    49. Candida Albicans Negative    50. Trichophyton mentagrophytes Negative    51. Mite, D Farinae  5,000 AU/ml Negative    52. Mite, D Pteronyssinus  5,000 AU/ml Negative    53. Cat Hair 10,000 BAU/ml Negative    54.  Dog Epithelia Negative    55. Mixed Feathers Negative    56. Horse Epithelia Negative    57. Cockroach, German Negative    58. Mouse Negative    59. Tobacco Leaf Negative          Food Adult Perc - 01/30/21 0900    Time Antigen Placed 0737    Allergen Manufacturer Lavella Hammock    Location Back    Number of allergen test 1    16. Coconut Negative           Past Medical History: Patient Active Problem List   Diagnosis Date Noted  . Rash and other nonspecific skin eruption 01/30/2021  . Pruritus 01/30/2021  . Other rhinitis 01/30/2021  . Adverse reaction to food, subsequent encounter 01/30/2021  . Sleep apnea in adult 12/26/2020  . 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 06/01/2020  . Left arm pain 06/01/2020  . Peroneal tendinitis of left lower leg 02/16/2020  . Leukocytosis 12/02/2019  . Pain of left heel 12/02/2019  . Pelvic pain 10/12/2019  . Post-operative state 10/12/2019  . Pain, abdominal, RLQ 11/28/2017  . Insulin resistance 10/21/2017  . Hyperlipidemia 06/09/2017  . Knee pain, right 03/10/2017  . Allergic state 12/08/2016  . Vitamin D deficiency 11/22/2016  . Depression with anxiety 04/06/2014  . Obesity (BMI 30-39.9) 04/06/2014  . Preventative health care 06/17/2011  . 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 INGUINAL PAIN, LEFT 12/05/2008   Past Medical History:  Diagnosis Date  . Allergic state 12/08/2016  . Allergy   . Anxiety   . Back pain    " i just have a weak loer back"   . Fibroids   . GERD (gastroesophageal reflux disease)   . Hyperlipidemia 06/09/2017  . Insulin resistance   . Joint pain   . Knee pain,  right 03/10/2017  . Plantar fasciitis   . PONV (postoperative nausea and vomiting)    just nausea   . Shortness of breath    not only if  i exert myselg   . Sinusitis   . Sleep apnea in adult 12/26/2020  . Vitamin D deficiency 11/22/2016  . Vitamin D deficiency    Past Surgical History: Past Surgical History:  Procedure Laterality Date  . ABDOMINAL HYSTERECTOMY  09/2019   [place]  . COLONOSCOPY  02-2009   with Henrene Pastor normal  . CYSTOSCOPY N/A 10/12/2019   Procedure: CYSTOSCOPY;  Surgeon: Sherlyn Hay, DO;  Location: [facility];  Service: Gynecology;  Laterality: N/A;  . ESOPHAGOGASTRODUODENOSCOPY  2012  . GANGLION CYST EXCISION Right    right- wrist   . NASAL SEPTUM SURGERY    . NASAL SINUS SURGERY    . TMJ ARTHROSCOPY     left  . TOTAL LAPAROSCOPIC HYSTERECTOMY WITH BILATERAL SALPINGO OOPHORECTOMY Bilateral 10/12/2019   Procedure: TOTAL LAPAROSCOPIC HYSTERECTOMY WITH BILATERAL SALPINGO OOPHORECTOMY, Lysis Pelvic Adhseions ;  Surgeon: Sherlyn Hay, DO;  Location: [facility];  Service: Gynecology;  Laterality: Bilateral;   Medication List:  Current Outpatient Medications  Medication Sig Dispense Refill  . acetaminophen (TYLENOL) 500 MG tablet Take 2 tablets (1,000 mg total) by mouth every 6 (six) hours as needed for moderate pain. 30 tablet 0  . Artificial Tear Solution (SOOTHE XP) SOLN Place 1 drop into both eyes daily as needed (dry eyes).    Marland Kitchen aspirin 81 MG EC tablet aspirin 81 mg tablet,delayed release  Take 1 tablet every day by oral route.    Marland Kitchen azelastine (ASTELIN) 0.1 % nasal spray Place 1-2 sprays into both nostrils 2 (two) times daily as needed (nasal drainage). Use in each nostril as directed 30 mL 5  . buPROPion (WELLBUTRIN) 75 MG tablet Take 1 tablet (75 mg total) by mouth daily. 90 tablet 1  . Calcium Carbonate-Vitamin D (CALTRATE 600+D PO) Take 2 tablets by mouth daily.    Marland Kitchen escitalopram (LEXAPRO) 10 MG tablet Take 1  tablet (10 mg total) by mouth daily. 90 tablet 3  . Krill Oil 500 MG CAPS Take 500 mg by mouth daily.    . metFORMIN (GLUCOPHAGE-XR) 500 MG 24 hr tablet Take 1 tablet (500 mg total) by mouth 2 (two) times daily. 60 tablet 0  . mometasone (NASONEX) 50 MCG/ACT nasal spray Place 2 sprays into the nose daily. 51 g 3  . Multiple Vitamin (MULTIVITAMIN WITH MINERALS) TABS tablet Take 1 tablet by mouth daily.    Marland Kitchen PFIZER-BIONTECH COVID-19 VACC 30 MCG/0.3ML injection     . vitamin C (ASCORBIC ACID) 500 MG tablet Take 500 mg by mouth daily.    . Vitamin D, Ergocalciferol, (DRISDOL) 1.25 MG (50000 UNIT) CAPS capsule Take 1 capsule (50,000 Units total) by mouth every 7 (seven) days. 4 capsule 0  . Zinc 50 MG TABS Take 50 mg by mouth 1 day or 1 dose.    . metFORMIN (GLUCOPHAGE) 500 MG tablet Take 1 tablet (500 mg total) by mouth daily with breakfast. 90 tablet 0   No current facility-administered medications for this visit.   Allergies: Allergies  Allergen Reactions  . Coconut Fatty Acids Hives  . Codeine   . Mango Flavor Hives  . [name] Itching and Swelling  . Oxycodone-Acetaminophen     rash on face and head, itching  . Tussionex Pennkinetic Er [Hydrocod Polst-Cpm Polst Er]     rash on face and head, itching  . Ultram [Tramadol] Other (See Comments)    Dizzy and low blood pressure.    Social History: Social History   Socioeconomic History  . Marital status: Single    Spouse name: Not on file  . Number of children: Not on file  . Years of education: Not on file  . Highest education level: Not on file  Occupational History  . Occupation: Therapist, sports  Employer: [employer]  Tobacco Use  . Smoking status: Never Smoker  . Smokeless tobacco: Never Used  Vaping Use  . Vaping Use: Never used  Substance and Sexual Activity  . Alcohol use: Yes    Alcohol/week: 0.0 standard drinks    Comment: rare  . Drug use: No  . Sexual activity: Yes    Birth control/protection: I.U.D.  Other Topics  Concern  . 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 Works at [employer]. No dietary restrictions. Lives with dog   Social Determinants of Health   Financial Resource Strain: Not on file  Food Insecurity: Not on file  Transportation Needs: Not on file  Physical Activity: Not on file  Stress: Not on file  Social Connections: Not on file   Lives in a 56 year old home. Smoking: denies Occupation: Tax inspector HistoryFreight forwarder in the house: no Carpet in the family room: no Carpet in the bedroom: no Heating: electric Cooling: central Pet: yes 2 dogs x 8 yrs, x 1 yr  Family History: Family History  Problem Relation Age of Onset  . Hypertension Father   . Diabetes Father   . Hyperlipidemia Father   . Breast cancer Mother 94  . Cancer Mother        breast  . Thyroid disease Mother   . Sleep apnea Mother   . Colon cancer Paternal Grandfather        died at age 82  . Cancer Paternal Grandfather        GI CA  . Obesity Sister   . Diabetes Sister   . Hypertension Sister   . Heart disease Maternal Grandmother        MI  . Cancer Maternal Grandfather        lung, smoker  . Diabetes Maternal Grandfather   . Diabetes Paternal Grandmother   . Heart disease Paternal Grandmother   . Pancreatitis Sister   . Esophageal cancer Neg Hx   . Stomach cancer Neg Hx   . Stroke Neg Hx   . Colon polyps Neg Hx   . Rectal cancer Neg Hx   . Pancreatic cancer Neg Hx    Problem                               Relation Asthma                                   No  Eczema                                Mother  Food allergy                          No  Allergic rhino conjunctivitis     Mother, father, sisters  Review of Systems  Constitutional: Negative for appetite change, chills, fever and unexpected weight change.  HENT: Negative for congestion and rhinorrhea.   Eyes: Negative for  itching.  Respiratory: Negative for cough, chest tightness, shortness of breath and wheezing.   Cardiovascular: Negative for chest pain.  Gastrointestinal: Negative for abdominal pain.  Genitourinary: Negative for difficulty urinating.  Skin: Positive for rash.  Neurological: Positive for headaches.   Objective: BP 130/82 (BP Location: Left Arm, Patient Position: Sitting, Cuff Size: Normal)   Pulse 81   Temp (!) 96.6 F (35.9 C) (Temporal)   Resp 17   Ht 5' 5.83" (1.672 m)   Wt 218 lb (98.9 kg)   LMP 11/22/2018 (Approximate)   SpO2 96%   BMI 35.37 kg/m  Body mass index is 35.37 kg/m. Physical Exam Vitals and nursing note reviewed.  Constitutional:      Appearance: Normal appearance. She is well-developed.  HENT:     Head: Normocephalic and atraumatic.     Right Ear: External ear normal.     Left Ear: External ear normal.     Nose: Nose normal.     Mouth/Throat:     Mouth: Mucous membranes are moist.     Pharynx: Oropharynx is clear.  Eyes:     Conjunctiva/sclera: Conjunctivae normal.  Cardiovascular:     Rate and Rhythm: Normal rate and regular rhythm.     Heart sounds: Normal heart sounds. No murmur heard. No friction rub. No gallop.   Pulmonary:     Effort: Pulmonary effort is normal.     Breath sounds: Normal breath sounds. No wheezing, rhonchi or rales.  Abdominal:     Palpations: Abdomen is soft.  Musculoskeletal:     Cervical back: Neck supple.  Skin:    General: Skin is warm.     Findings: No rash.  Neurological:     Mental Status: She is alert and oriented to person, place, and time.  Psychiatric:        Behavior: Behavior normal.    The plan was reviewed with the patient/family, and all questions/concerned were addressed.  It was my pleasure to see Hailey Noble today and participate in her care. Please feel free to contact me with any questions or concerns.  Sincerely,  Rexene Alberts, DO Allergy & Immunology  [facility] office: [place] office: [phone]

## 2021-01-31 ENCOUNTER — Encounter (INDEPENDENT_AMBULATORY_CARE_PROVIDER_SITE_OTHER): Payer: Self-pay

## 2021-02-07 ENCOUNTER — Other Ambulatory Visit: Payer: Self-pay | Admitting: Family Medicine

## 2021-02-07 ENCOUNTER — Telehealth (INDEPENDENT_AMBULATORY_CARE_PROVIDER_SITE_OTHER): Payer: Self-pay

## 2021-02-07 MED FILL — ESCITALOPRAM 10 MG TABLET: 10 | 90 days supply | Qty: 90 | Fill #0

## 2021-02-07 NOTE — Telephone Encounter (Signed)
April this pt was unable to get Saxenda but her Mancel Parsons was approved. Would you please let Linus Orn know so that she can send in a prescription for her.  Thank you.

## 2021-02-07 NOTE — Telephone Encounter (Signed)
[facility] called stating that patient arrived there for her refill on her [facility], Pharmacist stated that they didn't receive a prescription for pt's [facility] please give pharmacy a call at [phone]

## 2021-02-07 NOTE — Telephone Encounter (Signed)
Hailey Noble 

## 2021-02-07 NOTE — Telephone Encounter (Signed)
Please review

## 2021-02-08 ENCOUNTER — Other Ambulatory Visit (INDEPENDENT_AMBULATORY_CARE_PROVIDER_SITE_OTHER): Payer: Self-pay | Admitting: Physician Assistant

## 2021-02-08 MED ORDER — WEGOVY 0.25 MG/0.5ML ~~LOC~~ SOAJ: 0.2500 mg | SUBCUTANEOUS | 0 refills | Status: DC

## 2021-02-08 MED FILL — WEGOVY 0.25 MG/0.5ML SOAJ: 0.25 | 28 days supply | Qty: 1 | Fill #0

## 2021-02-08 NOTE — Telephone Encounter (Signed)
This was sent in thanks

## 2021-02-08 NOTE — Telephone Encounter (Signed)
Prescription sent in to the pharmacy. Thank you. April, [place]

## 2021-02-08 NOTE — Telephone Encounter (Signed)
Thank you. Hailey Noble, [place]

## 2021-02-09 ENCOUNTER — Encounter: Payer: 59 | Attending: Family Medicine

## 2021-02-09 DIAGNOSIS — Z713 Dietary counseling and surveillance: Secondary | ICD-10-CM | POA: Insufficient documentation

## 2021-02-15 ENCOUNTER — Other Ambulatory Visit: Payer: Self-pay

## 2021-02-15 ENCOUNTER — Encounter (INDEPENDENT_AMBULATORY_CARE_PROVIDER_SITE_OTHER): Payer: Self-pay | Admitting: Physician Assistant

## 2021-02-15 ENCOUNTER — Other Ambulatory Visit (INDEPENDENT_AMBULATORY_CARE_PROVIDER_SITE_OTHER): Payer: Self-pay | Admitting: Physician Assistant

## 2021-02-15 ENCOUNTER — Ambulatory Visit (INDEPENDENT_AMBULATORY_CARE_PROVIDER_SITE_OTHER): Payer: 59 | Admitting: Physician Assistant

## 2021-02-15 VITALS — BP 139/83 | HR 81 | Temp 98.0°F | Ht 65.0 in | Wt 216.0 lb

## 2021-02-15 DIAGNOSIS — R7303 Prediabetes: Secondary | ICD-10-CM | POA: Diagnosis not present

## 2021-02-15 DIAGNOSIS — E559 Vitamin D deficiency, unspecified: Secondary | ICD-10-CM

## 2021-02-15 DIAGNOSIS — Z6835 Body mass index (BMI) 35.0-35.9, adult: Secondary | ICD-10-CM | POA: Diagnosis not present

## 2021-02-15 DIAGNOSIS — Z6834 Body mass index (BMI) 34.0-34.9, adult: Secondary | ICD-10-CM | POA: Diagnosis not present

## 2021-02-15 DIAGNOSIS — Z130 Encounter for screening for diseases of the blood and blood-forming organs and certain disorders involving the immune mechanism: Secondary | ICD-10-CM | POA: Diagnosis not present

## 2021-02-15 DIAGNOSIS — Z9189 Other specified personal risk factors, not elsewhere classified: Secondary | ICD-10-CM | POA: Diagnosis not present

## 2021-02-15 DIAGNOSIS — Z1389 Encounter for screening for other disorder: Secondary | ICD-10-CM | POA: Diagnosis not present

## 2021-02-15 DIAGNOSIS — Z780 Asymptomatic menopausal state: Secondary | ICD-10-CM | POA: Diagnosis not present

## 2021-02-15 DIAGNOSIS — Z01419 Encounter for gynecological examination (general) (routine) without abnormal findings: Secondary | ICD-10-CM | POA: Diagnosis not present

## 2021-02-15 MED ORDER — METFORMIN HCL ER 500 MG PO TB24: 500.0000 mg | ORAL_TABLET | Freq: Two times a day (BID) | ORAL | 0 refills | Status: DC

## 2021-02-15 MED ORDER — VITAMIN D (ERGOCALCIFEROL) 1.25 MG (50000 UNIT) PO CAPS: 50000.0000 [IU] | ORAL_CAPSULE | ORAL | 0 refills | Status: DC

## 2021-02-15 MED ORDER — WEGOVY 0.5 MG/0.5ML ~~LOC~~ SOAJ: 0.5000 mg | SUBCUTANEOUS | 0 refills | Status: DC

## 2021-02-15 MED FILL — WEGOVY 0.5 MG/0.5ML SOAJ: 0.5 | 28 days supply | Qty: 2 | Fill #0

## 2021-02-16 MED FILL — VIT D2 1.25 MG (50,000 UNIT: 1.25 MG | 28 days supply | Qty: 4 | Fill #0

## 2021-02-19 MED FILL — metFORMIN HCL ER 500 MG TB2: 500 | 30 days supply | Qty: 60 | Fill #0

## 2021-02-19 NOTE — Progress Notes (Signed)
Chief Complaint:   OBESITY Rockell is here to discuss her progress with her obesity treatment plan along with follow-up of her obesity related diagnoses. Monie is on the Category 3 Plan and states she is following her eating plan approximately 85-90% of the time. Nasra states she is doing weights and on the elliptical for 30-45 minutes 3 times per week.  Today's visit was #: 29 Starting weight: 212 lbs Starting date: 10/06/2017 Today's weight: 216 lbs Today's date: 02/15/2021 Total lbs lost to date: 0 Total lbs lost since last in-office visit: 0  Interim History: Precious reports that she is in a new relationship and has been eating out more often. She started [facility] 4 days ago and she hasn't see a difference in her appetite as of yet.  Subjective:   1. Pre-diabetes Deetya is on metformin XR, and she denies nausea, vomiting, or diarrhea. Last A1c was 5.5. I discussed labs with the patient today.  2. Vitamin D deficiency Takyah is on Vit D, and she denies nausea, vomiting, or muscle weakness. Last Vit D level was not at goal. I discussed labs with the patient today.   3. At risk for diabetes mellitus Tarshia is at higher than average risk for developing diabetes due to obesity.   Assessment/Plan:   1. Pre-diabetes Siri will continue to work on weight loss, exercise, and decreasing simple carbohydrates to help decrease the risk of diabetes. We will refill metformin XR for 1 month.  - metFORMIN (GLUCOPHAGE-XR) 500 MG 24 hr tablet; Take 1 tablet (500 mg total) by mouth 2 (two) times daily.  Dispense: 60 tablet; Refill: 0  2. Vitamin D deficiency Low Vitamin D level contributes to fatigue and are associated with obesity, breast, and colon cancer. We will refill prescription Vitamin D for 1 month. Jeidy will follow-up for routine testing of Vitamin D, at least 2-3 times per year to avoid over-replacement.  - Vitamin D, Ergocalciferol, (DRISDOL) 1.25 MG (50000 UNIT) CAPS capsule;  Take 1 capsule (50,000 Units total) by mouth every 7 (seven) days.  Dispense: 4 capsule; Refill: 0  3. At risk for diabetes mellitus Ainslee was given approximately 15 minutes of diabetes education and counseling today. We discussed intensive lifestyle modifications today with an emphasis on weight loss as well as increasing exercise and decreasing simple carbohydrates in her diet. We also reviewed medication options with an emphasis on risk versus benefit of those discussed.   Repetitive spaced learning was employed today to elicit superior memory formation and behavioral change.  4. Class 2 severe obesity with serious comorbidity and body mass index (BMI) of 35.0 to 35.9 in adult, unspecified obesity type (HCC) Mayelin is currently in the action stage of change. As such, her goal is to continue with weight loss efforts. She has agreed to the Category 3 Plan.   We discussed various medication options to help Kaydon with her weight loss efforts and we both agreed to increase Wegovy to 0.5mg  #4 with no refills.  - Semaglutide-Weight Management (WEGOVY) 0.5 MG/0.5ML SOAJ; Inject 0.5 mg into the skin once a week.  Dispense: 2 mL; Refill: 0  Exercise goals: As is.  Behavioral modification strategies: decreasing eating out and meal planning and cooking strategies.  Latrenda has agreed to follow-up with our clinic in 2 weeks. She was informed of the importance of frequent follow-up visits to maximize her success with intensive lifestyle modifications for her multiple health conditions.   Objective:   Blood pressure 139/83, pulse 81,  temperature 98 F (36.7 C), height 5\' 5"  (1.651 m), weight 216 lb (98 kg), last menstrual period 11/22/2018, SpO2 95 %. Body mass index is 35.94 kg/m.  General: Cooperative, alert, well developed, in no acute distress. HEENT: Conjunctivae and lids unremarkable. Cardiovascular: Regular rhythm.  Lungs: Normal work of breathing. Neurologic: No focal deficits.   Lab  Results  Component Value Date   CREATININE 0.52 (L) 01/24/2021   BUN 16 01/24/2021   NA 143 01/24/2021   K 4.7 01/24/2021   CL 105 01/24/2021   CO2 23 01/24/2021   Lab Results  Component Value Date   ALT 17 01/24/2021   AST 14 01/24/2021   ALKPHOS 140 (H) 01/24/2021   BILITOT 0.2 01/24/2021   Lab Results  Component Value Date   HGBA1C 5.5 01/24/2021   HGBA1C 5.5 10/12/2020   HGBA1C 5.4 12/02/2019   HGBA1C 5.3 10/08/2019   HGBA1C 5.4 06/16/2019   Lab Results  Component Value Date   INSULIN 14.9 01/24/2021   INSULIN 10.5 10/12/2020   INSULIN 10.1 04/10/2020   INSULIN 9.3 12/30/2018   INSULIN 11.1 09/21/2018   Lab Results  Component Value Date   TSH 1.670 01/24/2021   Lab Results  Component Value Date   CHOL 184 01/24/2021   HDL 43 01/24/2021   LDLCALC 107 (H) 01/24/2021   TRIG 198 (H) 01/24/2021   CHOLHDL 4.3 01/24/2021   Lab Results  Component Value Date   WBC 7.7 06/01/2020   HGB 14.0 06/01/2020   HCT 41.7 06/01/2020   MCV 89.0 06/01/2020   PLT 349.0 06/01/2020   Lab Results  Component Value Date   IRON 94 06/01/2020   TIBC 363 06/01/2020   FERRITIN 36 06/01/2020   Attestation Statements:   Reviewed by clinician on day of visit: allergies, medications, problem list, medical history, surgical history, family history, social history, and previous encounter notes.   Wilhemena Durie, am acting as transcriptionist for [employer], PA-C.  I have reviewed the above documentation for accuracy and completeness, and I agree with the above. Abby Potash, PA-C

## 2021-02-23 ENCOUNTER — Encounter: Payer: 59 | Admitting: Registered"

## 2021-02-28 ENCOUNTER — Encounter (INDEPENDENT_AMBULATORY_CARE_PROVIDER_SITE_OTHER): Payer: Self-pay | Admitting: Physician Assistant

## 2021-02-28 ENCOUNTER — Other Ambulatory Visit: Payer: Self-pay

## 2021-02-28 ENCOUNTER — Ambulatory Visit (INDEPENDENT_AMBULATORY_CARE_PROVIDER_SITE_OTHER): Payer: 59 | Admitting: Physician Assistant

## 2021-02-28 VITALS — BP 129/76 | HR 83 | Temp 98.0°F | Ht 65.0 in | Wt 214.0 lb

## 2021-02-28 DIAGNOSIS — Z6835 Body mass index (BMI) 35.0-35.9, adult: Secondary | ICD-10-CM | POA: Diagnosis not present

## 2021-02-28 DIAGNOSIS — E7849 Other hyperlipidemia: Secondary | ICD-10-CM

## 2021-02-28 DIAGNOSIS — Z9189 Other specified personal risk factors, not elsewhere classified: Secondary | ICD-10-CM | POA: Diagnosis not present

## 2021-03-06 NOTE — Progress Notes (Signed)
Chief Complaint:   OBESITY Hailey Noble is here to discuss her progress with her obesity treatment plan along with follow-up of her obesity related diagnoses. Hailey Noble is on the Category 3 Plan and states she is following her eating plan approximately 90% of the time. Prachi states she is doing weights and cardio for 30-45 minutes 3 times per week.  Today's visit was #: 67 Starting weight: 212 lbs Starting date: 10/06/2017 Today's weight: 214 lbs Today's date: 02/28/2021 Total lbs lost to date: 0 Total lbs lost since last in-office visit: 2  Interim History: Hailey Noble reports that she continues to want to eat more than what's on the plan or snack more often. She is on Wegovy 0.5 mg. She is staying between 1500-1600 calories and 95-100 grams of protein.   Subjective:   1. Other hyperlipidemia Hailey Noble's last lipid panel was not at goal. She denies chest pain. She is exercising regularly, and she is not on medications.  2. At risk for heart disease Hailey Noble is at a higher than average risk for cardiovascular disease due to obesity.   Assessment/Plan:   1. Other hyperlipidemia Cardiovascular risk and specific lipid/LDL goals reviewed. We discussed several lifestyle modifications today. Edna will continue her meal plan, and will continue to work on exercise and weight loss efforts. Orders and follow up as documented in patient record.   Counseling Intensive lifestyle modifications are the first line treatment for this issue. . Dietary changes: Increase soluble fiber. Decrease simple carbohydrates. . Exercise changes: Moderate to vigorous-intensity aerobic activity 150 minutes per week if tolerated. . Lipid-lowering medications: see documented in medical record.  2. At risk for heart disease Lynea was given approximately 15 minutes of coronary artery disease prevention counseling today. She is 55 y.o. female and has risk factors for heart disease including obesity. We discussed intensive  lifestyle modifications today with an emphasis on specific weight loss instructions and strategies.   Repetitive spaced learning was employed today to elicit superior memory formation and behavioral change.  3. Class 2 severe obesity with serious comorbidity and body mass index (BMI) of 35.0 to 35.9 in adult, unspecified obesity type (HCC) Hailey Noble is currently in the action stage of change. As such, her goal is to continue with weight loss efforts. She has agreed to the Category 3 Plan.   We discussed various medication options to help Hailey Noble with her weight loss efforts and we both agreed to increase Wegovy to 1 mg q weekly, and we will refill for 1 month.  Exercise goals: As is.  Behavioral modification strategies: meal planning and cooking strategies and keeping healthy foods in the home.  Hailey Noble has agreed to follow-up with our clinic in 2 weeks. She was informed of the importance of frequent follow-up visits to maximize her success with intensive lifestyle modifications for her multiple health conditions.   Objective:   Blood pressure 129/76, pulse 83, temperature 98 F (36.7 C), height 5\' 5"  (1.651 m), weight 214 lb (97.1 kg), last menstrual period 11/22/2018, SpO2 96 %. Body mass index is 35.61 kg/m.  General: Cooperative, alert, well developed, in no acute distress. HEENT: Conjunctivae and lids unremarkable. Cardiovascular: Regular rhythm.  Lungs: Normal work of breathing. Neurologic: No focal deficits.   Lab Results  Component Value Date   CREATININE 0.52 (L) 01/24/2021   BUN 16 01/24/2021   NA 143 01/24/2021   K 4.7 01/24/2021   CL 105 01/24/2021   CO2 23 01/24/2021   Lab Results  Component Value  Date   ALT 17 01/24/2021   AST 14 01/24/2021   ALKPHOS 140 (H) 01/24/2021   BILITOT 0.2 01/24/2021   Lab Results  Component Value Date   HGBA1C 5.5 01/24/2021   HGBA1C 5.5 10/12/2020   HGBA1C 5.4 12/02/2019   HGBA1C 5.3 10/08/2019   HGBA1C 5.4 06/16/2019   Lab  Results  Component Value Date   INSULIN 14.9 01/24/2021   INSULIN 10.5 10/12/2020   INSULIN 10.1 04/10/2020   INSULIN 9.3 12/30/2018   INSULIN 11.1 09/21/2018   Lab Results  Component Value Date   TSH 1.670 01/24/2021   Lab Results  Component Value Date   CHOL 184 01/24/2021   HDL 43 01/24/2021   LDLCALC 107 (H) 01/24/2021   TRIG 198 (H) 01/24/2021   CHOLHDL 4.3 01/24/2021   Lab Results  Component Value Date   WBC 7.7 06/01/2020   HGB 14.0 06/01/2020   HCT 41.7 06/01/2020   MCV 89.0 06/01/2020   PLT 349.0 06/01/2020   Lab Results  Component Value Date   IRON 94 06/01/2020   TIBC 363 06/01/2020   FERRITIN 36 06/01/2020   Attestation Statements:   Reviewed by clinician on day of visit: allergies, medications, problem list, medical history, surgical history, family history, social history, and previous encounter notes.   Wilhemena Durie, am acting as transcriptionist for [employer], PA-C.  I have reviewed the above documentation for accuracy and completeness, and I agree with the above. Abby Potash, PA-C

## 2021-03-07 MED ORDER — WEGOVY 0.5 MG/0.5ML ~~LOC~~ SOAJ: 1.0000 mg | SUBCUTANEOUS | 0 refills | Status: DC

## 2021-03-09 ENCOUNTER — Encounter: Payer: 59 | Admitting: Registered"

## 2021-03-12 ENCOUNTER — Telehealth (INDEPENDENT_AMBULATORY_CARE_PROVIDER_SITE_OTHER): Payer: Self-pay

## 2021-03-12 ENCOUNTER — Other Ambulatory Visit (INDEPENDENT_AMBULATORY_CARE_PROVIDER_SITE_OTHER): Payer: Self-pay | Admitting: Physician Assistant

## 2021-03-12 MED ORDER — WEGOVY 0.5 MG/0.5ML ~~LOC~~ SOAJ: 1.0000 mg | SUBCUTANEOUS | 0 refills | Status: DC

## 2021-03-12 NOTE — Addendum Note (Signed)
Addended by: Wilfrid Lund on: 03/12/2021 12:29 PM   Modules accepted: Orders

## 2021-03-12 NOTE — Telephone Encounter (Signed)
pharmacy called in requesting a call back. They are questing the directions on the Wegovy. I informed the pt that I will send a message to the nurse and she will call you back. Please advise

## 2021-03-12 NOTE — Telephone Encounter (Signed)
Tracey sent in a new prescription with corrected dose. Alyna Stensland, [place]

## 2021-03-13 ENCOUNTER — Encounter (INDEPENDENT_AMBULATORY_CARE_PROVIDER_SITE_OTHER): Payer: Self-pay | Admitting: Physician Assistant

## 2021-03-14 ENCOUNTER — Other Ambulatory Visit (INDEPENDENT_AMBULATORY_CARE_PROVIDER_SITE_OTHER): Payer: Self-pay | Admitting: Physician Assistant

## 2021-03-14 ENCOUNTER — Other Ambulatory Visit (INDEPENDENT_AMBULATORY_CARE_PROVIDER_SITE_OTHER): Payer: Self-pay

## 2021-03-14 DIAGNOSIS — R7303 Prediabetes: Secondary | ICD-10-CM

## 2021-03-14 MED ORDER — WEGOVY 1 MG/0.5ML ~~LOC~~ SOAJ: 1.0000 mg | SUBCUTANEOUS | 0 refills | Status: DC

## 2021-03-14 MED FILL — WEGOVY 1 MG/0.5ML SOAJ: 1 | 28 days supply | Qty: 2 | Fill #0

## 2021-03-14 NOTE — Telephone Encounter (Signed)
Last seen by Abby Potash, PA-C.

## 2021-03-19 ENCOUNTER — Other Ambulatory Visit (INDEPENDENT_AMBULATORY_CARE_PROVIDER_SITE_OTHER): Payer: Self-pay | Admitting: Physician Assistant

## 2021-03-19 DIAGNOSIS — R7303 Prediabetes: Secondary | ICD-10-CM

## 2021-03-19 MED FILL — BUPROPION HCL 75 MG TABS: 75 | 90 days supply | Qty: 90 | Fill #1

## 2021-03-19 NOTE — Telephone Encounter (Signed)
Last OV with Tracey 

## 2021-03-19 NOTE — Telephone Encounter (Signed)
Hailey Noble was returning a call to April.  She states that if you were calling her regarding a refill of the Glucophage that she still has a 2 week supply.  Thank you

## 2021-03-19 NOTE — Telephone Encounter (Signed)
Thank you :)

## 2021-03-20 DIAGNOSIS — G4733 Obstructive sleep apnea (adult) (pediatric): Secondary | ICD-10-CM | POA: Diagnosis not present

## 2021-03-21 ENCOUNTER — Ambulatory Visit (INDEPENDENT_AMBULATORY_CARE_PROVIDER_SITE_OTHER): Payer: 59 | Admitting: Physician Assistant

## 2021-03-21 ENCOUNTER — Other Ambulatory Visit: Payer: Self-pay

## 2021-03-21 ENCOUNTER — Encounter (INDEPENDENT_AMBULATORY_CARE_PROVIDER_SITE_OTHER): Payer: Self-pay | Admitting: Physician Assistant

## 2021-03-21 ENCOUNTER — Other Ambulatory Visit (INDEPENDENT_AMBULATORY_CARE_PROVIDER_SITE_OTHER): Payer: Self-pay | Admitting: Physician Assistant

## 2021-03-21 VITALS — BP 121/74 | HR 82 | Temp 98.0°F | Ht 65.0 in | Wt 211.0 lb

## 2021-03-21 DIAGNOSIS — Z9189 Other specified personal risk factors, not elsewhere classified: Secondary | ICD-10-CM

## 2021-03-21 DIAGNOSIS — E559 Vitamin D deficiency, unspecified: Secondary | ICD-10-CM

## 2021-03-21 DIAGNOSIS — Z6841 Body Mass Index (BMI) 40.0 and over, adult: Secondary | ICD-10-CM

## 2021-03-21 DIAGNOSIS — R7303 Prediabetes: Secondary | ICD-10-CM

## 2021-03-21 MED ORDER — VITAMIN D (CHOLECALCIFEROL) 25 MCG (1000 UT) PO TABS: ORAL_TABLET | ORAL | Status: AC

## 2021-03-21 MED ORDER — METFORMIN HCL ER 500 MG PO TB24: 500.0000 mg | ORAL_TABLET | Freq: Two times a day (BID) | ORAL | 0 refills | Status: DC

## 2021-03-21 MED FILL — metFORMIN HCL ER 500 MG TB2: 500 | 30 days supply | Qty: 60 | Fill #0

## 2021-03-26 ENCOUNTER — Telehealth: Payer: 59 | Admitting: Family Medicine

## 2021-03-27 NOTE — Progress Notes (Signed)
Chief Complaint:   OBESITY Hailey Noble is here to discuss her progress with her obesity treatment plan along with follow-up of her obesity related diagnoses. Hailey Noble is on the Category 3 Plan and states she is following her eating plan approximately 90% of the time. Hailey Noble states she is walking, doing strengthening, and swimming for 60 minutes 3 times per week.  Today's visit was #: 80 Starting weight: 212 lbs Starting date: 10/06/2017 Today's weight: 211 lbs Today's date: 03/21/2021 Total lbs lost to date: 1 Total lbs lost since last in-office visit: 3  Interim History: [name] reports that [facility] 1 mg is helping her portion control as well as decreases hunger between meals. She has been eating out less.  Subjective:   1. Pre-diabetes Hailey Noble is on metformin BID, and she is tolerating it well.  2. Vitamin D deficiency Hailey Noble is on 4,000 units daily OTC Vit D, and 50,000 units weekly. Last level was nearly at goal.  3. At risk for hypoglycemia Hailey Noble is at increased risk for hypoglycemia due to changes in diet, diagnosis of diabetes, and/or insulin use.    Assessment/Plan:   1. Pre-diabetes Cletis will continue to work on weight loss, exercise, and decreasing simple carbohydrates to help decrease the risk of diabetes. We will refill metformin 500 mg BID #60 for 1 month.  2. Vitamin D deficiency Low Vitamin D level contributes to fatigue and are associated with obesity, breast, and colon cancer. Xylah agreed to continue taking prescription Vitamin D 50,000 IU every week, and change to 4,000 IU OTC daily. She will follow-up for routine testing of Vitamin D, at least 2-3 times per year to avoid over-replacement.  - Vitamin D, Cholecalciferol, 25 MCG (1000 UT) TABS; Take 4,000 units daily  Dispense: 60 tablet  3. At risk for hypoglycemia Hailey Noble was given approximately 15 minutes of counseling today regarding prevention of hypoglycemia. She was advised of symptoms of hypoglycemia.  Hailey Noble was instructed to avoid skipping meals, eat regular protein rich meals and schedule low calorie snacks as needed.   Repetitive spaced learning was employed today to elicit superior memory formation and behavioral change  4. Class 3 severe obesity with serious comorbidity and body mass index (BMI) of 40.0 to 44.9 in adult, unspecified obesity type (HCC) Hailey Noble is currently in the action stage of change. As such, her goal is to continue with weight loss efforts. She has agreed to the Category 3 Plan.   Exercise goals: As is.  Behavioral modification strategies: meal planning and cooking strategies and planning for success.  Hailey Noble has agreed to follow-up with our clinic in 3 weeks. She was informed of the importance of frequent follow-up visits to maximize her success with intensive lifestyle modifications for her multiple health conditions.   Objective:   Blood pressure 121/74, pulse 82, temperature 98 F (36.7 C), temperature source Oral, height 5\' 5"  (1.651 m), weight 211 lb (95.7 kg), last menstrual period 11/22/2018, SpO2 94 %. Body mass index is 35.11 kg/m.  General: Cooperative, alert, well developed, in no acute distress. HEENT: Conjunctivae and lids unremarkable. Cardiovascular: Regular rhythm.  Lungs: Normal work of breathing. Neurologic: No focal deficits.   Lab Results  Component Value Date   CREATININE 0.52 (L) 01/24/2021   BUN 16 01/24/2021   NA 143 01/24/2021   K 4.7 01/24/2021   CL 105 01/24/2021   CO2 23 01/24/2021   Lab Results  Component Value Date   ALT 17 01/24/2021   AST 14 01/24/2021  ALKPHOS 140 (H) 01/24/2021   BILITOT 0.2 01/24/2021   Lab Results  Component Value Date   HGBA1C 5.5 01/24/2021   HGBA1C 5.5 10/12/2020   HGBA1C 5.4 12/02/2019   HGBA1C 5.3 10/08/2019   HGBA1C 5.4 06/16/2019   Lab Results  Component Value Date   INSULIN 14.9 01/24/2021   INSULIN 10.5 10/12/2020   INSULIN 10.1 04/10/2020   INSULIN 9.3 12/30/2018    INSULIN 11.1 09/21/2018   Lab Results  Component Value Date   TSH 1.670 01/24/2021   Lab Results  Component Value Date   CHOL 184 01/24/2021   HDL 43 01/24/2021   LDLCALC 107 (H) 01/24/2021   TRIG 198 (H) 01/24/2021   CHOLHDL 4.3 01/24/2021   Lab Results  Component Value Date   WBC 7.7 06/01/2020   HGB 14.0 06/01/2020   HCT 41.7 06/01/2020   MCV 89.0 06/01/2020   PLT 349.0 06/01/2020   Lab Results  Component Value Date   IRON 94 06/01/2020   TIBC 363 06/01/2020   FERRITIN 36 06/01/2020   Attestation Statements:   Reviewed by clinician on day of visit: allergies, medications, problem list, medical history, surgical history, family history, social history, and previous encounter notes.   Wilhemena Durie, am acting as transcriptionist for [employer], PA-C.  I have reviewed the above documentation for accuracy and completeness, and I agree with the above. Abby Potash, PA-C

## 2021-04-10 ENCOUNTER — Ambulatory Visit (INDEPENDENT_AMBULATORY_CARE_PROVIDER_SITE_OTHER): Payer: 59 | Admitting: Physician Assistant

## 2021-04-10 ENCOUNTER — Other Ambulatory Visit (HOSPITAL_BASED_OUTPATIENT_CLINIC_OR_DEPARTMENT_OTHER): Payer: Self-pay

## 2021-04-10 ENCOUNTER — Other Ambulatory Visit: Payer: Self-pay

## 2021-04-10 ENCOUNTER — Encounter (INDEPENDENT_AMBULATORY_CARE_PROVIDER_SITE_OTHER): Payer: Self-pay | Admitting: Physician Assistant

## 2021-04-10 VITALS — BP 127/76 | HR 78 | Temp 98.2°F | Ht 65.0 in | Wt 213.0 lb

## 2021-04-10 DIAGNOSIS — Z6841 Body Mass Index (BMI) 40.0 and over, adult: Secondary | ICD-10-CM | POA: Diagnosis not present

## 2021-04-10 DIAGNOSIS — Z9189 Other specified personal risk factors, not elsewhere classified: Secondary | ICD-10-CM | POA: Diagnosis not present

## 2021-04-10 DIAGNOSIS — R7303 Prediabetes: Secondary | ICD-10-CM | POA: Diagnosis not present

## 2021-04-10 DIAGNOSIS — E7849 Other hyperlipidemia: Secondary | ICD-10-CM | POA: Diagnosis not present

## 2021-04-10 MED ORDER — METFORMIN HCL ER 500 MG PO TB24
ORAL_TABLET | Freq: Two times a day (BID) | ORAL | 0 refills | Status: DC
  Filled 2021-04-10 – 2021-04-12 (×2): qty 60, 30d supply, fill #0

## 2021-04-10 MED ORDER — WEGOVY 1.7 MG/0.75ML ~~LOC~~ SOAJ
1.7000 mg | SUBCUTANEOUS | 0 refills | Status: DC
  Filled 2021-04-10: qty 3, 28d supply, fill #0

## 2021-04-11 NOTE — Progress Notes (Signed)
Chief Complaint:   OBESITY Hailey Noble is here to discuss her progress with her obesity treatment plan along with follow-up of her obesity related diagnoses. Hailey Noble is on the Category 3 Plan and states she is following her eating plan approximately 90-95% of the time. Hailey Noble states she is walking and stationary bike 30-45 minutes 3 times per week.  Today's visit was #: 49 Starting weight: 212 lbs Starting date: 10/06/2017 Today's weight: 213 lbs Today's date: 04/10/2021 Total lbs lost to date: 0 Total lbs lost since last in-office visit: 0  Interim History: Hailey Noble is frustrated with lack of weight loss. She is working out regularly but eating out more often than she would prefer, due to her boyfriend wanting to eat out. She finds herself hungry between meals at times.   Subjective:   1. Pre-diabetes Hailey Noble is on Metformin BID and tolerating it well.  2. Other hyperlipidemia Hailey Noble is not on statin therapy. She is exercising regularly.  3. At risk for heart disease Hailey Noble is at a higher than average risk for cardiovascular disease due to obesity.   Assessment/Plan:   1. Pre-diabetes Hailey Noble will continue to work on weight loss, exercise, and decreasing simple carbohydrates to help decrease the risk of diabetes.   - metFORMIN (GLUCOPHAGE-XR) 500 MG 24 hr tablet; TAKE 1 TABLET (500 MG TOTAL) BY MOUTH 2 (TWO) TIMES DAILY.  Dispense: 60 tablet; Refill: 0  2. Other hyperlipidemia Cardiovascular risk and specific lipid/LDL goals reviewed.  We discussed several lifestyle modifications today and Hailey Noble will continue to work on diet, exercise and weight loss efforts. Orders and follow up as documented in patient record. Continue with plan and exercise.  Counseling Intensive lifestyle modifications are the first line treatment for this issue. . Dietary changes: Increase soluble fiber. Decrease simple carbohydrates. . Exercise changes: Moderate to vigorous-intensity aerobic activity 150  minutes per week if tolerated. . Lipid-lowering medications: see documented in medical record.  3. At risk for heart disease Hailey Noble was given approximately 15 minutes of coronary artery disease prevention counseling today. She is 55 y.o. female and has risk factors for heart disease including obesity. We discussed intensive lifestyle modifications today with an emphasis on specific weight loss instructions and strategies.   Repetitive spaced learning was employed today to elicit superior memory formation and behavioral change.  4. Class 3 severe obesity with serious comorbidity and body mass index (BMI) of 40.0 to 44.9 in adult, unspecified obesity type (HCC) Hailey Noble is currently in the action stage of change. As such, her goal is to continue with weight loss efforts. She has agreed to the Category 3 Plan.   Fasting IC and labs at next visit.  We discussed various medication options to help Hailey Noble with her weight loss efforts and we both agreed to increase Wegovy to 1.7 mg, as prescribed below. - Semaglutide-Weight Management (WEGOVY) 1.7 MG/0.75ML SOAJ; Inject 1.7 mg into the skin once a week.  Dispense: 3 mL; Refill: 0  Exercise goals: As is  Behavioral modification strategies: increasing lean protein intake, meal planning and cooking strategies and better snacking choices.  Hailey Noble has agreed to follow-up with our clinic in 2 weeks. She was informed of the importance of frequent follow-up visits to maximize her success with intensive lifestyle modifications for her multiple health conditions.   Objective:   Blood pressure 127/76, pulse 78, temperature 98.2 F (36.8 C), height 5\' 5"  (1.651 m), weight 213 lb (96.6 kg), last menstrual period 11/22/2018, SpO2 96 %. Body mass  index is 35.45 kg/m.  General: Cooperative, alert, well developed, in no acute distress. HEENT: Conjunctivae and lids unremarkable. Cardiovascular: Regular rhythm.  Lungs: Normal work of breathing. Neurologic: No  focal deficits.   Lab Results  Component Value Date   CREATININE 0.52 (L) 01/24/2021   BUN 16 01/24/2021   NA 143 01/24/2021   K 4.7 01/24/2021   CL 105 01/24/2021   CO2 23 01/24/2021   Lab Results  Component Value Date   ALT 17 01/24/2021   AST 14 01/24/2021   ALKPHOS 140 (H) 01/24/2021   BILITOT 0.2 01/24/2021   Lab Results  Component Value Date   HGBA1C 5.5 01/24/2021   HGBA1C 5.5 10/12/2020   HGBA1C 5.4 12/02/2019   HGBA1C 5.3 10/08/2019   HGBA1C 5.4 06/16/2019   Lab Results  Component Value Date   INSULIN 14.9 01/24/2021   INSULIN 10.5 10/12/2020   INSULIN 10.1 04/10/2020   INSULIN 9.3 12/30/2018   INSULIN 11.1 09/21/2018   Lab Results  Component Value Date   TSH 1.670 01/24/2021   Lab Results  Component Value Date   CHOL 184 01/24/2021   HDL 43 01/24/2021   LDLCALC 107 (H) 01/24/2021   TRIG 198 (H) 01/24/2021   CHOLHDL 4.3 01/24/2021   Lab Results  Component Value Date   WBC 7.7 06/01/2020   HGB 14.0 06/01/2020   HCT 41.7 06/01/2020   MCV 89.0 06/01/2020   PLT 349.0 06/01/2020   Lab Results  Component Value Date   IRON 94 06/01/2020   TIBC 363 06/01/2020   FERRITIN 36 06/01/2020    Attestation Statements:   Reviewed by clinician on day of visit: allergies, medications, problem list, medical history, surgical history, family history, social history, and previous encounter notes.  Coral Ceo, am acting as Location manager for [employer], PA-C.  I have reviewed the above documentation for accuracy and completeness, and I agree with the above. Abby Potash, PA-C

## 2021-04-12 ENCOUNTER — Other Ambulatory Visit (HOSPITAL_BASED_OUTPATIENT_CLINIC_OR_DEPARTMENT_OTHER): Payer: Self-pay

## 2021-04-13 ENCOUNTER — Other Ambulatory Visit (HOSPITAL_BASED_OUTPATIENT_CLINIC_OR_DEPARTMENT_OTHER): Payer: Self-pay

## 2021-04-13 ENCOUNTER — Other Ambulatory Visit: Payer: Self-pay | Admitting: Family Medicine

## 2021-04-13 MED ORDER — FLUTICASONE PROPIONATE 50 MCG/ACT NA SUSP
2.0000 | Freq: Every day | NASAL | 5 refills | Status: DC
  Filled 2021-04-13: qty 16, 30d supply, fill #0
  Filled 2021-07-22: qty 16, 30d supply, fill #1

## 2021-04-16 ENCOUNTER — Other Ambulatory Visit (HOSPITAL_BASED_OUTPATIENT_CLINIC_OR_DEPARTMENT_OTHER): Payer: Self-pay

## 2021-04-24 ENCOUNTER — Other Ambulatory Visit: Payer: Self-pay

## 2021-04-24 ENCOUNTER — Ambulatory Visit (INDEPENDENT_AMBULATORY_CARE_PROVIDER_SITE_OTHER): Payer: 59 | Admitting: Physician Assistant

## 2021-04-24 ENCOUNTER — Encounter (INDEPENDENT_AMBULATORY_CARE_PROVIDER_SITE_OTHER): Payer: Self-pay | Admitting: Physician Assistant

## 2021-04-24 ENCOUNTER — Other Ambulatory Visit (HOSPITAL_BASED_OUTPATIENT_CLINIC_OR_DEPARTMENT_OTHER): Payer: Self-pay

## 2021-04-24 VITALS — BP 145/71 | HR 80 | Temp 97.6°F | Ht 65.0 in | Wt 211.0 lb

## 2021-04-24 DIAGNOSIS — E559 Vitamin D deficiency, unspecified: Secondary | ICD-10-CM

## 2021-04-24 DIAGNOSIS — Z6841 Body Mass Index (BMI) 40.0 and over, adult: Secondary | ICD-10-CM

## 2021-04-24 DIAGNOSIS — Z9189 Other specified personal risk factors, not elsewhere classified: Secondary | ICD-10-CM

## 2021-04-24 DIAGNOSIS — E66813 Obesity, class 3: Secondary | ICD-10-CM

## 2021-04-24 MED ORDER — WEGOVY 1.7 MG/0.75ML ~~LOC~~ SOAJ
1.7000 mg | SUBCUTANEOUS | 0 refills | Status: DC
  Filled 2021-04-24 – 2021-05-02 (×2): qty 3, 28d supply, fill #0

## 2021-04-25 ENCOUNTER — Ambulatory Visit: Payer: 59 | Admitting: Medical

## 2021-04-25 ENCOUNTER — Ambulatory Visit (HOSPITAL_BASED_OUTPATIENT_CLINIC_OR_DEPARTMENT_OTHER)
Admission: RE | Admit: 2021-04-25 | Discharge: 2021-04-25 | Disposition: A | Discharge status: Home / Self Care | Payer: 59 | Source: Ambulatory Visit | Attending: Gastroenterology | Admitting: Gastroenterology

## 2021-04-25 ENCOUNTER — Ambulatory Visit (INDEPENDENT_AMBULATORY_CARE_PROVIDER_SITE_OTHER): Payer: 59 | Admitting: Gastroenterology

## 2021-04-25 ENCOUNTER — Encounter: Payer: Self-pay | Admitting: Gastroenterology

## 2021-04-25 ENCOUNTER — Other Ambulatory Visit (HOSPITAL_BASED_OUTPATIENT_CLINIC_OR_DEPARTMENT_OTHER): Payer: Self-pay

## 2021-04-25 ENCOUNTER — Other Ambulatory Visit (INDEPENDENT_AMBULATORY_CARE_PROVIDER_SITE_OTHER): Payer: 59

## 2021-04-25 VITALS — BP 138/92 | HR 89 | Ht 65.0 in | Wt 214.1 lb

## 2021-04-25 DIAGNOSIS — R112 Nausea with vomiting, unspecified: Secondary | ICD-10-CM

## 2021-04-25 DIAGNOSIS — K625 Hemorrhage of anus and rectum: Secondary | ICD-10-CM

## 2021-04-25 DIAGNOSIS — R1013 Epigastric pain: Secondary | ICD-10-CM

## 2021-04-25 DIAGNOSIS — K824 Cholesterolosis of gallbladder: Secondary | ICD-10-CM | POA: Diagnosis not present

## 2021-04-25 LAB — CBC WITH DIFFERENTIAL/PLATELET
Basophils Absolute: 0.1 10*3/uL (ref 0.0–0.1)
Basophils Relative: 0.6 % (ref 0.0–3.0)
Eosinophils Absolute: 0.1 10*3/uL (ref 0.0–0.7)
Eosinophils Relative: 1 % (ref 0.0–5.0)
HCT: 42.7 % (ref 36.0–46.0)
Hemoglobin: 14.2 g/dL (ref 12.0–15.0)
Lymphocytes Relative: 18.5 % (ref 12.0–46.0)
Lymphs Abs: 1.7 10*3/uL (ref 0.7–4.0)
MCHC: 33.2 g/dL (ref 30.0–36.0)
MCV: 88.9 fl (ref 78.0–100.0)
Monocytes Absolute: 0.4 10*3/uL (ref 0.1–1.0)
Monocytes Relative: 4.5 % (ref 3.0–12.0)
Neutro Abs: 6.8 10*3/uL (ref 1.4–7.7)
Neutrophils Relative %: 75.4 % (ref 43.0–77.0)
Platelets: 336 10*3/uL (ref 150.0–400.0)
RBC: 4.8 Mil/uL (ref 3.87–5.11)
RDW: 13.3 % (ref 11.5–15.5)
WBC: 9 10*3/uL (ref 4.0–10.5)

## 2021-04-25 LAB — COMPREHENSIVE METABOLIC PANEL WITH GFR
ALT: 19 U/L (ref 0–35)
AST: 16 U/L (ref 0–37)
Albumin: 4.4 g/dL (ref 3.5–5.2)
Alkaline Phosphatase: 138 U/L — ABNORMAL HIGH (ref 39–117)
BUN: 16 mg/dL (ref 6–23)
CO2: 29 meq/L (ref 19–32)
Calcium: 9.3 mg/dL (ref 8.4–10.5)
Chloride: 102 meq/L (ref 96–112)
Creatinine, Ser: 0.56 mg/dL (ref 0.40–1.20)
GFR: 102.34 mL/min
Glucose, Bld: 87 mg/dL (ref 70–99)
Potassium: 4.2 meq/L (ref 3.5–5.1)
Sodium: 139 meq/L (ref 135–145)
Total Bilirubin: 0.4 mg/dL (ref 0.2–1.2)
Total Protein: 7 g/dL (ref 6.0–8.3)

## 2021-04-25 LAB — HEMOGLOBIN A1C: Hgb A1c MFr Bld: 5.5 % (ref 4.6–6.5)

## 2021-04-25 LAB — LIPASE: Lipase: 22 U/L (ref 11.0–59.0)

## 2021-04-25 MED ORDER — PANTOPRAZOLE SODIUM 40 MG PO TBEC
40.0000 mg | DELAYED_RELEASE_TABLET | Freq: Every day | ORAL | 3 refills | Status: DC
  Filled 2021-04-25: qty 90, 90d supply, fill #0
  Filled 2021-07-16: qty 90, 90d supply, fill #1
  Filled 2021-10-15: qty 90, 90d supply, fill #2
  Filled 2022-01-11: qty 90, 90d supply, fill #3

## 2021-04-25 MED ORDER — CLENPIQ 10-3.5-12 MG-GM -GM/160ML PO SOLN
1.0000 | Freq: Once | ORAL | 0 refills | Status: AC
  Filled 2021-04-25: qty 320, 30d supply, fill #0

## 2021-04-25 NOTE — Progress Notes (Signed)
Chief Complaint: abdo pain  Referring Provider:  Mosie Lukes, MD      ASSESSMENT AND PLAN;   #1. GERD with Epi pain. D/d PUD, GERD, gastritis, nonulcer dyspepsia, gastroparesis, musculoskeletal etiology, r/o biliary or pancreatic problems.  #2. IBS-C  #3. Rectal bleed-appears to have resolved. She has family history of colon CA in a second-degree relative.  #4. H/O tubular adenoma 04/2016.  Due for repeat colon.  Plan: -CBC, CMP, lipase, celiac, HBA1c -[name] abdo stat -EGD/colon for further eval -Protonix 40mg  po qd #30 -If still with problems and the above WU is neg, proceed with CT scan abdo/pelvis.   Discussed risks & benefits. Risks including rare perforation req laparotomy, bleeding after bx/polypectomy req blood transfusion, rare risk of splenic trauma, rarely missing neoplasms, risks of anesthesia/sedation. Benefits outweigh the risks. Patient agrees to proceed. All the questions were answered. Consent forms given for review.  HPI:    Hailey Noble is a 55 y.o. 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  She also had lower abdominal discomfort.  She is normally more constipated with pellet-like stools.  However, she started having increasing diarrhea which later on turned somewhat bloody.  Blood has resolved.  However she did see some bright red blood outside the stool.  No dizziness.  No nonsteroidals.  No odynophagia or dysphagia.  She denies having any fever chills or night sweats.  She did not eat anything unusual.  Nobody in the family is sick.  He does have a history of heartburn.  No alcohol.  No jaundice dark urine or pale stools.   FH - colon ca-paternal aunt.  SH-planning to go to [place] next week for birthday.  Staying @ [employer].  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 Resected and retrieved. - The examination was otherwise normal on direct and retroflexion views.  EGD 09/2011: Nl  Past Medical History:  Diagnosis Date  . Allergic state 12/08/2016  . Allergy   . Anxiety   . Back pain    " i just have a weak loer back"   . Fibroids   . GERD (gastroesophageal reflux disease)   . Hyperlipidemia 06/09/2017  . Insulin resistance   . Joint pain   . Knee pain, right 03/10/2017  . Plantar fasciitis   . PONV (postoperative nausea and vomiting)    just nausea   . Shortness of breath    not only if  i exert myselg   . Sinusitis   . Sleep apnea in adult 12/26/2020  . Vitamin D deficiency 11/22/2016  . Vitamin D deficiency     Past Surgical History:  Procedure Laterality Date  . ABDOMINAL HYSTERECTOMY  09/2019   [place]  . COLONOSCOPY  02-2009   with Henrene Pastor normal  . CYSTOSCOPY N/A 10/12/2019   Procedure: CYSTOSCOPY;  Surgeon: Sherlyn Hay, DO;  Location: [facility];  Service: Gynecology;  Laterality: N/A;  . ESOPHAGOGASTRODUODENOSCOPY  2012  . GANGLION CYST EXCISION Right    right- wrist   . NASAL SEPTUM SURGERY    . NASAL SINUS SURGERY    . TMJ ARTHROSCOPY     left  . TOTAL LAPAROSCOPIC HYSTERECTOMY WITH BILATERAL SALPINGO OOPHORECTOMY Bilateral 10/12/2019   Procedure: TOTAL LAPAROSCOPIC HYSTERECTOMY WITH BILATERAL SALPINGO OOPHORECTOMY, Lysis Pelvic Adhseions ;  Surgeon: Sherlyn Hay, DO;  Location: [facility];  Service: Gynecology;  Laterality: Bilateral;    Family History  Problem Relation Age of Onset  . Hypertension Father   . Diabetes Father   . Hyperlipidemia Father   . Breast cancer Mother 68  . Cancer Mother        breast  . Thyroid disease Mother   . Sleep apnea Mother   . Colon cancer Paternal Grandfather        died at age 70  . Cancer Paternal Grandfather        GI CA  . Obesity Sister   . Diabetes Sister   . Hypertension Sister   . Heart disease Maternal Grandmother         MI  . Cancer Maternal Grandfather        lung, smoker  . Diabetes Maternal Grandfather   . Diabetes Paternal Grandmother   . Heart disease Paternal Grandmother   . Pancreatitis Sister   . Esophageal cancer Neg Hx   . Stomach cancer Neg Hx   . Stroke Neg Hx   . Colon polyps Neg Hx   . Rectal cancer Neg Hx   . Pancreatic cancer Neg Hx     Social History   Tobacco Use  . Smoking status: Never Smoker  . Smokeless tobacco: Never Used  Vaping Use  . Vaping Use: Never used  Substance Use Topics  . Alcohol use: Yes    Alcohol/week: 0.0 standard drinks    Comment: rare  . Drug use: No    Current Outpatient Medications  Medication Sig Dispense Refill  . Artificial Tear Solution (SOOTHE XP) SOLN Place 1 drop into both eyes daily as needed (dry eyes).    Marland Kitchen buPROPion (WELLBUTRIN) 75 MG tablet TAKE 1 TABLET BY MOUTH ONCE DAILY 90 tablet 1  . Calcium Carbonate-Vitamin D (CALTRATE 600+D PO) Take 2 tablets by mouth daily.    Marland Kitchen escitalopram (LEXAPRO) 10 MG tablet TAKE 1 TABLET BY MOUTH ONCE DAILY 90 tablet 3  . fluticasone (FLONASE) 50 MCG/ACT nasal spray Place 2 sprays into both nostrils daily. 16 g 5  . Krill Oil 500 MG CAPS Take 500 mg by mouth daily.    . metFORMIN (GLUCOPHAGE-XR) 500 MG 24 hr tablet TAKE 1 TABLET (500 MG TOTAL) BY MOUTH 2 (TWO) TIMES DAILY. 60 tablet 0  . Semaglutide-Weight Management (WEGOVY) 1.7 MG/0.75ML SOAJ Inject 1.7 mg into the skin once a week. 3 mL 0  . Vitamin D, Cholecalciferol, 25 MCG (1000 UT) TABS Take 4,000 units daily 60 tablet    No current facility-administered medications for this visit.    Allergies  Allergen Reactions  . Coconut Fatty Acids Hives  . Codeine   . Mango Flavor Hives  . [name] Itching and Swelling  . Oxycodone-Acetaminophen     rash on face and head, itching  . Tussionex Pennkinetic Er [Hydrocod Polst-Cpm Polst Er]     rash on face and head, itching  . Ultram [Tramadol] Other (See Comments)    Dizzy and low blood  pressure.     Review of Systems:  Constitutional: Denies fever, chills, diaphoresis, appetite change and fatigue.  HEENT: Denies photophobia, eye pain, redness, hearing loss, ear pain, congestion, sore throat, rhinorrhea, sneezing, mouth sores, neck pain, neck stiffness and tinnitus.   Respiratory: Denies SOB, DOE, cough, chest tightness,  and wheezing.   Cardiovascular: Denies chest pain, palpitations and leg swelling.  Genitourinary: Denies dysuria, urgency, frequency, hematuria,  flank pain and difficulty urinating.  Musculoskeletal: Denies myalgias, back pain, joint swelling, arthralgias and gait problem.  Skin: No rash.  Neurological: Denies dizziness, seizures, syncope, weakness, light-headedness, numbness and headaches.  Hematological: Denies adenopathy. Easy bruising, personal or family bleeding history  Psychiatric/Behavioral: Has anxiety or depression     Physical Exam:    BP (!) 138/92 (BP Location: Right Arm, Patient Position: Sitting, Cuff Size: Normal)   Pulse 89   Ht 5\' 5"  (1.651 m)   Wt 214 lb 2 oz (97.1 kg)   LMP 11/22/2018 (Approximate)   BMI 35.63 kg/m  Wt Readings from Last 3 Encounters:  04/25/21 214 lb 2 oz (97.1 kg)  04/24/21 211 lb (95.7 kg)  04/10/21 213 lb (96.6 kg)   Constitutional:  Well-developed, in no acute distress. Psychiatric: Normal mood and affect. Behavior is normal. HEENT: Pupils normal.  Conjunctivae are normal. No scleral icterus. Cardiovascular: Normal rate, regular rhythm. No edema Pulmonary/chest: Effort normal and breath sounds normal. No wheezing, rales or rhonchi. Abdominal: Soft, nondistended.  Mild epigastric tenderness and mild left lower quadrant abdominal tenderness without rebound.. Bowel sounds active throughout. There are no masses palpable. No hepatomegaly. Rectal: to be Performed the time of colonoscopy Neurological: Alert and oriented to person place and time. Skin: Skin is warm and dry. No rashes noted.  Data Reviewed:  I have personally reviewed following labs and imaging studies  CBC: CBC Latest Ref Rng & Units 06/01/2020 12/02/2019 10/13/2019  WBC 4.0 - 10.5 K/uL 7.7 7.1 14.6(H)  Hemoglobin 12.0 - 15.0 g/dL 14.0 13.6 12.5  Hematocrit 36.0 - 46.0 % 41.7 40.9 40.6  Platelets 150.0 - 400.0 K/uL 349.0 333.0 294    CMP: CMP Latest Ref Rng & Units 01/24/2021 10/12/2020 06/01/2020  Glucose 65 - 99 mg/dL 96 82 94  BUN 6 - 24 mg/dL 16 17 23   Creatinine 0.57 - 1.00 mg/dL 0.52(L) 0.63 0.55  Sodium 134 - 144 mmol/L 143 141 137  Potassium 3.5 - 5.2 mmol/L 4.7 4.5 4.4  Chloride 96 - 106 mmol/L 105 103 104  CO2 20 - 29 mmol/L 23 26 29   Calcium 8.7 - 10.2 mg/dL 9.3 9.4 9.4  Total Protein 6.0 - 8.5 g/dL 6.8 6.8 7.0  Total Bilirubin 0.0 - 1.2 mg/dL 0.2 0.3 0.4  Alkaline Phos 44 - 121 IU/L 140(H) 144(H) 138(H)  AST 0 - 40 IU/L 14 15 20   ALT 0 - 32 IU/L 17 19 26      Carmell Austria, MD 04/25/2021, 9:11 AM  Cc: Mosie Lukes, MD

## 2021-04-25 NOTE — Patient Instructions (Addendum)
If you are age 56 or older, your body mass index should be between 23-30. Your Body mass index is 35.63 kg/m. If this is out of the aforementioned range listed, please consider follow up with your Primary Care Provider.  If you are age 20 or younger, your body mass index should be between 19-25. Your Body mass index is 35.63 kg/m. If this is out of the aformentioned range listed, please consider follow up with your Primary Care Provider.   You have been scheduled for an endoscopy and colonoscopy. Please follow the written instructions given to you at your visit today. Please pick up your prep supplies at the pharmacy within the next 1-3 days. If you use inhalers (even only as needed), please bring them with you on the day of your procedure.  We have sent the following medications to your pharmacy for you to pick up at your convenience: Protonix Clenpiq  Please go to the lab on the 2nd floor [address] before you leave the office today.    Thank you,  Dr. Jackquline Denmark

## 2021-04-25 NOTE — Progress Notes (Signed)
Chief Complaint:   OBESITY Hailey Noble is here to discuss her progress with her obesity treatment plan along with follow-up of her obesity related diagnoses. Hailey Noble is on the Category 3 Plan and states she is following her eating plan approximately 90% of the time. Hailey Noble states she is riding the bike for 60-120 minutes 3 times per week.  Today's visit was #: 87 Starting weight: 212 lbs Starting date: 10/06/2017 Today's weight: 211 lbs Today's date: 04/24/2021 Total lbs lost to date: 1 Total lbs lost since last in-office visit: 2  Interim History: Hailey Noble is on Wegovy 1.7 mg. She is appropriately hungry at meal times, but not she is not excessively hungry between meals. She is traveling to the [place] next week.  Subjective:   1. Vitamin D deficiency Hailey Noble is on Vit D, and she is tolerating it well. She is exercising outside.  2. At risk for osteoporosis Hailey Noble is at higher risk of osteopenia and osteoporosis due to Vitamin D deficiency.   Assessment/Plan:   1. Vitamin D deficiency Low Vitamin D level contributes to fatigue and are associated with obesity, breast, and colon cancer. Hailey Noble agreed to continue taking Vitamin D 4,000 IU daily and will follow-up for routine testing of Vitamin D, at least 2-3 times per year to avoid over-replacement.  2. At risk for osteoporosis Hailey Noble was given approximately 15 minutes of osteoporosis prevention counseling today. Hailey Noble is at risk for osteopenia and osteoporosis due to her Vitamin D deficiency. She was encouraged to take her Vitamin D and follow her higher calcium diet and increase strengthening exercise to help strengthen her bones and decrease her risk of osteopenia and osteoporosis.  Repetitive spaced learning was employed today to elicit superior memory formation and behavioral change.  3. Class 3 severe obesity with serious comorbidity and body mass index (BMI) of 40.0 to 44.9 in adult, unspecified obesity type (HCC) Hailey Noble is  currently in the action stage of change. As such, her goal is to continue with weight loss efforts. She has agreed to the Category 3 Plan.   We discussed various medication options to help Hailey Noble with her weight loss efforts and we both agreed to continue [place], and we will refill for 1 month.  - Semaglutide-Weight Management (WEGOVY) 1.7 MG/0.75ML SOAJ; Inject 1.7 mg into the skin once a week.  Dispense: 3 mL; Refill: 0  We will recheck fasting labs at her next visit.  Exercise goals: As is.  Behavioral modification strategies: meal planning and cooking strategies and travel eating strategies.  Hailey Noble has agreed to follow-up with our clinic in 3 weeks. She was informed of the importance of frequent follow-up visits to maximize her success with intensive lifestyle modifications for her multiple health conditions.   Objective:   Blood pressure (!) 145/71, pulse 80, temperature 97.6 F (36.4 C), height 5\' 5"  (1.651 m), weight 211 lb (95.7 kg), last menstrual period 11/22/2018, SpO2 95 %. Body mass index is 35.11 kg/m.  General: Cooperative, alert, well developed, in no acute distress. HEENT: Conjunctivae and lids unremarkable. Cardiovascular: Regular rhythm.  Lungs: Normal work of breathing. Neurologic: No focal deficits.   Lab Results  Component Value Date   CREATININE 0.52 (L) 01/24/2021   BUN 16 01/24/2021   NA 143 01/24/2021   K 4.7 01/24/2021   CL 105 01/24/2021   CO2 23 01/24/2021   Lab Results  Component Value Date   ALT 17 01/24/2021   AST 14 01/24/2021   ALKPHOS 140 (H) 01/24/2021  BILITOT 0.2 01/24/2021   Lab Results  Component Value Date   HGBA1C 5.5 01/24/2021   HGBA1C 5.5 10/12/2020   HGBA1C 5.4 12/02/2019   HGBA1C 5.3 10/08/2019   HGBA1C 5.4 06/16/2019   Lab Results  Component Value Date   INSULIN 14.9 01/24/2021   INSULIN 10.5 10/12/2020   INSULIN 10.1 04/10/2020   INSULIN 9.3 12/30/2018   INSULIN 11.1 09/21/2018   Lab Results  Component  Value Date   TSH 1.670 01/24/2021   Lab Results  Component Value Date   CHOL 184 01/24/2021   HDL 43 01/24/2021   LDLCALC 107 (H) 01/24/2021   TRIG 198 (H) 01/24/2021   CHOLHDL 4.3 01/24/2021   Lab Results  Component Value Date   WBC 7.7 06/01/2020   HGB 14.0 06/01/2020   HCT 41.7 06/01/2020   MCV 89.0 06/01/2020   PLT 349.0 06/01/2020   Lab Results  Component Value Date   IRON 94 06/01/2020   TIBC 363 06/01/2020   FERRITIN 36 06/01/2020   Attestation Statements:   Reviewed by clinician on day of visit: allergies, medications, problem list, medical history, surgical history, family history, social history, and previous encounter notes.   Wilhemena Durie, am acting as transcriptionist for [employer], PA-C.  I have reviewed the above documentation for accuracy and completeness, and I agree with the above. Abby Potash, PA-C

## 2021-04-27 ENCOUNTER — Other Ambulatory Visit: Payer: Self-pay

## 2021-04-27 ENCOUNTER — Other Ambulatory Visit (INDEPENDENT_AMBULATORY_CARE_PROVIDER_SITE_OTHER): Payer: 59

## 2021-04-27 DIAGNOSIS — R112 Nausea with vomiting, unspecified: Secondary | ICD-10-CM

## 2021-04-27 DIAGNOSIS — R1013 Epigastric pain: Secondary | ICD-10-CM

## 2021-04-27 DIAGNOSIS — K625 Hemorrhage of anus and rectum: Secondary | ICD-10-CM

## 2021-04-28 LAB — CELIAC PANEL 10
Antigliadin Abs, IgA: 4 units (ref 0–19)
Endomysial IgA: NEGATIVE
Gliadin IgG: 1 units (ref 0–19)
IgA/Immunoglobulin A, Serum: 294 mg/dL (ref 87–352)
Tissue Transglut Ab: <2 U/mL (ref 0–5)
Transglutaminase IgA: <2 U/mL (ref 0–3)

## 2021-05-01 LAB — MITOCHONDRIAL ANTIBODIES: Mitochondrial M2 Ab, IgG: 26 U — ABNORMAL HIGH

## 2021-05-02 ENCOUNTER — Other Ambulatory Visit (HOSPITAL_BASED_OUTPATIENT_CLINIC_OR_DEPARTMENT_OTHER): Payer: Self-pay

## 2021-05-08 ENCOUNTER — Encounter: Payer: Self-pay | Admitting: Internal Medicine

## 2021-05-09 ENCOUNTER — Other Ambulatory Visit (HOSPITAL_BASED_OUTPATIENT_CLINIC_OR_DEPARTMENT_OTHER): Payer: Self-pay

## 2021-05-14 ENCOUNTER — Encounter (INDEPENDENT_AMBULATORY_CARE_PROVIDER_SITE_OTHER): Payer: Self-pay | Admitting: Physician Assistant

## 2021-05-14 ENCOUNTER — Ambulatory Visit (INDEPENDENT_AMBULATORY_CARE_PROVIDER_SITE_OTHER): Payer: 59 | Admitting: Physician Assistant

## 2021-05-14 ENCOUNTER — Ambulatory Visit: Payer: 59 | Admitting: Gastroenterology

## 2021-05-14 ENCOUNTER — Other Ambulatory Visit: Payer: Self-pay

## 2021-05-14 VITALS — BP 137/81 | HR 81 | Temp 98.0°F | Ht 65.0 in | Wt 211.0 lb

## 2021-05-14 DIAGNOSIS — E559 Vitamin D deficiency, unspecified: Secondary | ICD-10-CM | POA: Diagnosis not present

## 2021-05-14 DIAGNOSIS — Z6841 Body Mass Index (BMI) 40.0 and over, adult: Secondary | ICD-10-CM | POA: Diagnosis not present

## 2021-05-14 DIAGNOSIS — Z9189 Other specified personal risk factors, not elsewhere classified: Secondary | ICD-10-CM

## 2021-05-14 DIAGNOSIS — R0602 Shortness of breath: Secondary | ICD-10-CM

## 2021-05-14 DIAGNOSIS — E7849 Other hyperlipidemia: Secondary | ICD-10-CM | POA: Diagnosis not present

## 2021-05-14 DIAGNOSIS — E66813 Obesity, class 3: Secondary | ICD-10-CM

## 2021-05-14 NOTE — Progress Notes (Signed)
Chief Complaint:   OBESITY Hailey Noble is here to discuss her progress with her obesity treatment plan along with follow-up of her obesity related diagnoses. Hailey Noble is on the Category 3 Plan and states she is following her eating plan approximately 90% of the time. Hailey Noble states she is walking for 120 minutes 4 times per week.  Today's visit was #: 25 Starting weight: 212 lbs Starting date: 10/06/2017 Today's weight: 211 lbs Today's date: 05/14/2021 Total lbs lost to date: 1 Total lbs lost since last in-office visit: 0  Interim History: Hailey Noble just returned from [place] when she did very well with portion control and making smarter choices. She is not hungry on Wegovy 1.7 mg. She just saw GI due to hematochezia and she has a colonoscopy set for next month.  Subjective:   1. Vitamin D deficiency Hailey Noble is on 4,000 units daily, and she denies nausea, vomiting, or muscle weakness.  2. Other hyperlipidemia Hailey Noble is not on medications, and she denies chest pain. She is exercising 4 times per week.  3. Shortness of breath Hailey Noble notes shortness of breath with exertion. She denies dizziness or lightheadedness.  4. At risk for diabetes mellitus Hailey Noble is at higher than average risk for developing diabetes due to obesity.   Assessment/Plan:   1. Vitamin D deficiency Low Vitamin D level contributes to fatigue and are associated with obesity, breast, and colon cancer. We will check labs today. Erline Levine agreed OTC Vitamin D and will follow-up for routine testing of Vitamin D, at least 2-3 times per year to avoid over-replacement.  - VITAMIN D 25 Hydroxy (Vit-D Deficiency, Fractures)  2. Other hyperlipidemia Cardiovascular risk and specific lipid/LDL goals reviewed. We discussed several lifestyle modifications today. We will check labs today. Margean will continue to work on diet, exercise and weight loss efforts. Orders and follow up as documented in patient record.    Counseling Intensive lifestyle modifications are the first line treatment for this issue. . Dietary changes: Increase soluble fiber. Decrease simple carbohydrates. . Exercise changes: Moderate to vigorous-intensity aerobic activity 150 minutes per week if tolerated. . Lipid-lowering medications: see documented in medical record.  - Lipid panel  3. Shortness of breath IC was done today. Ennis has agreed to work on weight loss and gradually increase exercise to treat her exercise induced shortness of breath. Will continue to monitor closely.  4. At risk for diabetes mellitus Mammie was given approximately 15 minutes of diabetes education and counseling today. We discussed intensive lifestyle modifications today with an emphasis on weight loss as well as increasing exercise and decreasing simple carbohydrates in her diet. We also reviewed medication options with an emphasis on risk versus benefit of those discussed.   Repetitive spaced learning was employed today to elicit superior memory formation and behavioral change.  5. Class 3 severe obesity with serious comorbidity and body mass index (BMI) of 40.0 to 44.9 in adult, unspecified obesity type (HCC) Hailey Noble is currently in the action stage of change. As such, her goal is to continue with weight loss efforts. She has agreed to change to the Category 2 Plan + 1 cup of [employer] milk.   Exercise goals: As is.  Behavioral modification strategies: meal planning and cooking strategies and keeping healthy foods in the home.  Hailey Noble has agreed to follow-up with our clinic in 2 weeks. She was informed of the importance of frequent follow-up visits to maximize her success with intensive lifestyle modifications for her multiple health conditions.  Objective:   Blood pressure 137/81, pulse 81, temperature 98 F (36.7 C), height 5\' 5"  (1.651 m), weight 211 lb (95.7 kg), last menstrual period 11/22/2018, SpO2 95 %. Body mass index is 35.11  kg/m.  General: Cooperative, alert, well developed, in no acute distress. HEENT: Conjunctivae and lids unremarkable. Cardiovascular: Regular rhythm.  Lungs: Normal work of breathing. Neurologic: No focal deficits.   Lab Results  Component Value Date   CREATININE 0.56 04/25/2021   BUN 16 04/25/2021   NA 139 04/25/2021   K 4.2 04/25/2021   CL 102 04/25/2021   CO2 29 04/25/2021   Lab Results  Component Value Date   ALT 19 04/25/2021   AST 16 04/25/2021   ALKPHOS 138 (H) 04/25/2021   BILITOT 0.4 04/25/2021   Lab Results  Component Value Date   HGBA1C 5.5 04/25/2021   HGBA1C 5.5 01/24/2021   HGBA1C 5.5 10/12/2020   HGBA1C 5.4 12/02/2019   HGBA1C 5.3 10/08/2019   Lab Results  Component Value Date   INSULIN 14.9 01/24/2021   INSULIN 10.5 10/12/2020   INSULIN 10.1 04/10/2020   INSULIN 9.3 12/30/2018   INSULIN 11.1 09/21/2018   Lab Results  Component Value Date   TSH 1.670 01/24/2021   Lab Results  Component Value Date   CHOL 184 01/24/2021   HDL 43 01/24/2021   LDLCALC 107 (H) 01/24/2021   TRIG 198 (H) 01/24/2021   CHOLHDL 4.3 01/24/2021   Lab Results  Component Value Date   WBC 9.0 04/25/2021   HGB 14.2 04/25/2021   HCT 42.7 04/25/2021   MCV 88.9 04/25/2021   PLT 336.0 04/25/2021   Lab Results  Component Value Date   IRON 94 06/01/2020   TIBC 363 06/01/2020   FERRITIN 36 06/01/2020   Attestation Statements:   Reviewed by clinician on day of visit: allergies, medications, problem list, medical history, surgical history, family history, social history, and previous encounter notes.   Wilhemena Durie, am acting as transcriptionist for [employer], PA-C.  I have reviewed the above documentation for accuracy and completeness, and I agree with the above. -  *Abby Potash, PA-C

## 2021-05-15 ENCOUNTER — Ambulatory Visit: Payer: 59 | Admitting: Adult Health

## 2021-05-15 ENCOUNTER — Encounter: Payer: Self-pay | Admitting: Adult Health

## 2021-05-15 VITALS — BP 144/71 | HR 80 | Ht 65.0 in | Wt 218.0 lb

## 2021-05-15 DIAGNOSIS — Z9989 Dependence on other enabling machines and devices: Secondary | ICD-10-CM

## 2021-05-15 DIAGNOSIS — G4733 Obstructive sleep apnea (adult) (pediatric): Secondary | ICD-10-CM | POA: Diagnosis not present

## 2021-05-15 LAB — LIPID PANEL
Chol/HDL Ratio: 4 ratio (ref 0.0–4.4)
Cholesterol, Total: 191 mg/dL (ref 100–199)
HDL: 48 mg/dL (ref >39)
LDL Chol Calc (NIH): 116 mg/dL — ABNORMAL HIGH (ref 0–99)
Triglycerides: 155 mg/dL — ABNORMAL HIGH (ref 0–149)
VLDL Cholesterol Cal: 27 mg/dL (ref 5–40)

## 2021-05-15 LAB — VITAMIN D 25 HYDROXY (VIT D DEFICIENCY, FRACTURES): Vit D, 25-Hydroxy: 45.5 ng/mL (ref 30.0–100.0)

## 2021-05-15 NOTE — Patient Instructions (Signed)
Continue using CPAP nightly and greater than 4 hours each night °If your symptoms worsen or you develop new symptoms please let us know.  ° °

## 2021-05-15 NOTE — Progress Notes (Addendum)
PATIENT: Hailey Noble DOB: 1966/02/05  REASON FOR VISIT: follow up HISTORY FROM: patient  HISTORY OF PRESENT ILLNESS: Today 05/15/21:  Ms. Depner is a 55 year old female with a history of obstructive sleep apnea on CPAP.  She returns today for follow-up.  She reports that she is having trouble with the mask leaking.  She states that often wakes her up during the night.  She states that if she sleeps on her side the mask will leak.  When she first started CPAP she did notice the benefit.    HISTORY 11/13/2020: I reviewed her CPAP compliance data from 10/02/2020 through 10/31/2020, which is a total of 30 days, during which time she used her machine 26 days with percent use days greater than 4 hours at 70%, indicating adequate compliance with an average usage of 5 hours and 18 minutes, residual AHI at goal at 1.6/h, leak consistently on the higher side with the 95th percentile at 60.8 L/min on a pressure of 14 cm with EPR of 3.  She reports doing well, she has initial adjustment challenges to her CPAP but feels well adapted to treatment and has benefited from it.  She reports better sleep consolidation, better sleep quality and less daytime somnolence.  She does notice a leak from the mask, she tries to sleep sideways but she sleeps with her arm extended above her head on the right side.  She would wake up at times and the mask is leaking from the mouth and mouth is slightly open, she does have some dryness in her mouth from the CPAP, she also uses a humidifier in the room as well as distilled water in the humidifier chamber of the machine.  She uses a DreamWear full facemask.  She was started on Saxenda about a month ago.  She is still adjusting the dosing with her weight loss specialist.  Her right nostril is often stopped up and she does have some nosebleeds from the right side which is not severe.   REVIEW OF SYSTEMS: Out of a complete 14 system review of symptoms, the patient complains only of  the following symptoms, and all other reviewed systems are negative.  FSS 58 ESS 16  ALLERGIES: Allergies  Allergen Reactions   Coconut Fatty Acids Hives   Codeine    Mango Flavor Hives   [name] Itching and Swelling   Oxycodone-Acetaminophen     rash on face and head, itching   Tussionex Pennkinetic Er [Hydrocod Polst-Cpm Polst Er]     rash on face and head, itching   Ultram [Tramadol] Other (See Comments)    Dizzy and low blood pressure.     HOME MEDICATIONS: Outpatient Medications Prior to Visit  Medication Sig Dispense Refill   Artificial Tear Solution (SOOTHE XP) SOLN Place 1 drop into both eyes daily as needed (dry eyes).     buPROPion (WELLBUTRIN) 75 MG tablet TAKE 1 TABLET BY MOUTH ONCE DAILY 90 tablet 1   Calcium Carbonate-Vitamin D (CALTRATE 600+D PO) Take 2 tablets by mouth daily.     escitalopram (LEXAPRO) 10 MG tablet TAKE 1 TABLET BY MOUTH ONCE DAILY 90 tablet 3   fluticasone (FLONASE) 50 MCG/ACT nasal spray Place 2 sprays into both nostrils daily. 16 g 5   Krill Oil 500 MG CAPS Take 500 mg by mouth daily.     pantoprazole (PROTONIX) 40 MG tablet Take 1 tablet (40 mg total) by mouth daily. 90 tablet 3   Semaglutide-Weight Management (WEGOVY) 1.7 MG/0.75ML SOAJ  Inject 1.7 mg into the skin once a week. 3 mL 0   Vitamin D, Cholecalciferol, 25 MCG (1000 UT) TABS Take 4,000 units daily 60 tablet    No facility-administered medications prior to visit.    PAST MEDICAL HISTORY: Past Medical History:  Diagnosis Date   Allergic state 12/08/2016   Allergy    Anxiety    Back pain    " i just have a weak loer back"    Fibroids    GERD (gastroesophageal reflux disease)    Hyperlipidemia 06/09/2017   Insulin resistance    Joint pain    Knee pain, right 03/10/2017   Plantar fasciitis    PONV (postoperative nausea and vomiting)    just nausea    Shortness of breath    not only if  i exert myselg    Sinusitis    Sleep apnea in adult 12/26/2020   Vitamin D  deficiency 11/22/2016   Vitamin D deficiency     PAST SURGICAL HISTORY: Past Surgical History:  Procedure Laterality Date   ABDOMINAL HYSTERECTOMY  09/2019   [place] spo   COLONOSCOPY  02-2009   with Henrene Pastor normal   CYSTOSCOPY N/A 10/12/2019   Procedure: CYSTOSCOPY;  Surgeon: Sherlyn Hay, DO;  Location: [facility];  Service: Gynecology;  Laterality: N/A;   ESOPHAGOGASTRODUODENOSCOPY  2012   GANGLION CYST EXCISION Right    right- wrist    NASAL SEPTUM SURGERY     NASAL SINUS SURGERY     TMJ ARTHROSCOPY     left   TOTAL LAPAROSCOPIC HYSTERECTOMY WITH BILATERAL SALPINGO OOPHORECTOMY Bilateral 10/12/2019   Procedure: TOTAL LAPAROSCOPIC HYSTERECTOMY WITH BILATERAL SALPINGO OOPHORECTOMY, Lysis Pelvic Adhseions ;  Surgeon: Sherlyn Hay, DO;  Location: [facility];  Service: Gynecology;  Laterality: Bilateral;    FAMILY HISTORY: Family History  Problem Relation Age of Onset   Hypertension Father    Diabetes Father    Hyperlipidemia Father    Breast cancer Mother 38   Cancer Mother        breast   Thyroid disease Mother    Sleep apnea Mother    Colon cancer Paternal Grandfather        died at age 42   Cancer Paternal Grandfather        GI CA   Obesity Sister    Diabetes Sister    Hypertension Sister    Heart disease Maternal Grandmother        MI   Cancer Maternal Grandfather        lung, smoker   Diabetes Maternal Grandfather    Diabetes Paternal Grandmother    Heart disease Paternal Grandmother    Pancreatitis Sister    Esophageal cancer Neg Hx    Stomach cancer Neg Hx    Stroke Neg Hx    Colon polyps Neg Hx    Rectal cancer Neg Hx    Pancreatic cancer Neg Hx     SOCIAL HISTORY: Social History   Socioeconomic History   Marital status: Single    Spouse name: Not on file   Number of children: Not on file   Years of education: Not on file   Highest education level: Not on file  Occupational History   Occupation: Facilities manager: [employer]  Tobacco Use   Smoking status: Never Smoker   Smokeless tobacco: Never Used  Scientific laboratory technician Use: Never used  Substance and Sexual Activity  Alcohol use: Yes    Alcohol/week: 0.0 standard drinks    Comment: rare   Drug use: No   Sexual activity: Yes    Birth control/protection: I.U.D.  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 Works at [employer]. No dietary restrictions. Lives with dog   Social Determinants of Health   Financial Resource Strain: Not on file  Food Insecurity: Not on file  Transportation Needs: Not on file  Physical Activity: Not on file  Stress: Not on file  Social Connections: Not on file  Intimate Partner Violence: Not on file      PHYSICAL EXAM  Vitals:   05/15/21 0818  BP: (!) 144/71  Pulse: 80  Weight: 218 lb (98.9 kg)  Height: 5\' 5"  (1.651 m)   Body mass index is 36.28 kg/m.  Generalized: Well developed, in no acute distress  Chest: Lungs clear to auscultation bilaterally  Neurological examination  Mentation: Alert oriented to time, place, history taking. Follows all commands speech and language fluent Cranial nerve II-XII: Extraocular movements were full, visual field were full on confrontational test Head turning and shoulder shrug  were normal and symmetric. Motor: The motor testing reveals 5 over 5 strength of all 4 extremities. Good symmetric motor tone is noted throughout.  Sensory: Sensory testing is intact to soft touch on all 4 extremities. No evidence of extinction is noted.  Gait and station: Gait is normal.    DIAGNOSTIC DATA (LABS, IMAGING, TESTING) - I reviewed patient records, labs, notes, testing and imaging myself where available.  Lab Results  Component Value Date   WBC 9.0 04/25/2021   HGB 14.2 04/25/2021   HCT 42.7 04/25/2021   MCV 88.9 04/25/2021   PLT 336.0 04/25/2021       Component Value Date/Time   NA 139 04/25/2021 1053   NA 143 01/24/2021 0843   K 4.2 04/25/2021 1053   CL 102 04/25/2021 1053   CO2 29 04/25/2021 1053   GLUCOSE 87 04/25/2021 1053   GLUCOSE 86 11/13/2009 0000   BUN 16 04/25/2021 1053   BUN 16 01/24/2021 0843   CREATININE 0.56 04/25/2021 1053   CREATININE 0.67 11/22/2016 1521   CALCIUM 9.3 04/25/2021 1053   PROT 7.0 04/25/2021 1053   PROT 6.8 01/24/2021 0843   ALBUMIN 4.4 04/25/2021 1053   ALBUMIN 4.3 01/24/2021 0843   AST 16 04/25/2021 1053   ALT 19 04/25/2021 1053   ALKPHOS 138 (H) 04/25/2021 1053   BILITOT 0.4 04/25/2021 1053   BILITOT 0.2 01/24/2021 0843   GFRNONAA 108 01/24/2021 0843   GFRAA 124 01/24/2021 0843   Lab Results  Component Value Date   CHOL 191 05/14/2021   HDL 48 05/14/2021   LDLCALC 116 (H) 05/14/2021   TRIG 155 (H) 05/14/2021   CHOLHDL 4.0 05/14/2021   Lab Results  Component Value Date   HGBA1C 5.5 04/25/2021   Lab Results  Component Value Date   VITAMINB12 678 10/06/2017   Lab Results  Component Value Date   TSH 1.670 01/24/2021      ASSESSMENT AND PLAN 55 y.o. year old female  has a past medical history of Allergic state (12/08/2016), Allergy, Anxiety, Back pain, Fibroids, GERD (gastroesophageal reflux disease), Hyperlipidemia (06/09/2017), Insulin resistance, Joint pain, Knee pain, right (03/10/2017), Plantar fasciitis, PONV (postoperative nausea and vomiting), Shortness of breath, Sinusitis, Sleep apnea in adult (12/26/2020), Vitamin D deficiency (11/22/2016), and  Vitamin D deficiency. here with:  OSA on CPAP  - CPAP compliance suboptimal - Good treatment of AHI  - Mask refitting ordered - Encourage patient to use CPAP nightly and > 4 hours each night - F/U in 1 year or sooner if needed   Ward Givens, MSN, NP-C 05/15/2021, 8:36 AM [facility] [address], [place], [place] [address] [phone]  I reviewed the above note and documentation by the Nurse  Practitioner and agree with the history, exam, assessment and plan as outlined above. I was available for consultation. Star Age, MD, PhD [facility])

## 2021-05-17 ENCOUNTER — Other Ambulatory Visit: Payer: Self-pay

## 2021-05-17 NOTE — Progress Notes (Signed)
Positive AMA, with elevated alk phos and history of mild itching. 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  Would need liver biopsy to confirm and before we commit her to lifelong treatment.  Plan: -Proceed with US guided liver biopsy (IR).  Note that [facility] is off on June 16. If we can get it done that day. -She would need PT INR before  RG

## 2021-05-18 ENCOUNTER — Other Ambulatory Visit: Payer: Self-pay

## 2021-05-18 DIAGNOSIS — K625 Hemorrhage of anus and rectum: Secondary | ICD-10-CM

## 2021-05-18 DIAGNOSIS — R899 Unspecified abnormal finding in specimens from other organs, systems and tissues: Secondary | ICD-10-CM

## 2021-05-18 DIAGNOSIS — R1013 Epigastric pain: Secondary | ICD-10-CM

## 2021-05-18 NOTE — Progress Notes (Signed)
Orders in and patient is aware

## 2021-05-22 ENCOUNTER — Other Ambulatory Visit: Payer: Self-pay

## 2021-05-22 ENCOUNTER — Other Ambulatory Visit (INDEPENDENT_AMBULATORY_CARE_PROVIDER_SITE_OTHER): Payer: 59

## 2021-05-22 DIAGNOSIS — R1013 Epigastric pain: Secondary | ICD-10-CM

## 2021-05-22 DIAGNOSIS — R899 Unspecified abnormal finding in specimens from other organs, systems and tissues: Secondary | ICD-10-CM | POA: Diagnosis not present

## 2021-05-22 DIAGNOSIS — K625 Hemorrhage of anus and rectum: Secondary | ICD-10-CM

## 2021-05-22 LAB — PROTIME-INR
INR: 1 ratio (ref 0.8–1.0)
Prothrombin Time: 11.2 s (ref 9.6–13.1)

## 2021-05-23 LAB — ACUTE VIRAL HEPATITIS (HAV, HBV, HCV)
HCV Ab: <0.1 s/co ratio (ref 0.0–0.9)
Hep A IgM: NEGATIVE
Hep B C IgM: NEGATIVE
Hepatitis B Surface Ag: NEGATIVE

## 2021-05-23 LAB — HCV INTERPRETATION

## 2021-05-24 DIAGNOSIS — H5203 Hypermetropia, bilateral: Secondary | ICD-10-CM | POA: Diagnosis not present

## 2021-05-25 ENCOUNTER — Other Ambulatory Visit: Payer: Self-pay

## 2021-05-25 ENCOUNTER — Encounter: Payer: 59 | Admitting: Registered"

## 2021-05-25 ENCOUNTER — Ambulatory Visit (INDEPENDENT_AMBULATORY_CARE_PROVIDER_SITE_OTHER): Payer: 59 | Admitting: Gastroenterology

## 2021-05-25 DIAGNOSIS — Z23 Encounter for immunization: Secondary | ICD-10-CM

## 2021-05-25 LAB — HEPATITIS B SURFACE ANTIBODY,QUALITATIVE: Hep B S Ab: NONREACTIVE

## 2021-05-25 LAB — HEPATITIS A ANTIBODY, TOTAL: Hepatitis A AB,Total: NONREACTIVE

## 2021-05-25 LAB — ANTI-SMOOTH MUSCLE ANTIBODY, IGG: Actin (Smooth Muscle) Antibody (IGG): <20 U (ref <20)

## 2021-05-29 ENCOUNTER — Other Ambulatory Visit (HOSPITAL_BASED_OUTPATIENT_CLINIC_OR_DEPARTMENT_OTHER): Payer: Self-pay

## 2021-05-29 ENCOUNTER — Encounter (INDEPENDENT_AMBULATORY_CARE_PROVIDER_SITE_OTHER): Payer: Self-pay | Admitting: Physician Assistant

## 2021-05-29 ENCOUNTER — Ambulatory Visit (INDEPENDENT_AMBULATORY_CARE_PROVIDER_SITE_OTHER): Payer: 59 | Admitting: Physician Assistant

## 2021-05-29 ENCOUNTER — Other Ambulatory Visit: Payer: Self-pay

## 2021-05-29 VITALS — BP 134/78 | HR 87 | Temp 98.0°F | Ht 65.0 in | Wt 212.0 lb

## 2021-05-29 DIAGNOSIS — Z9189 Other specified personal risk factors, not elsewhere classified: Secondary | ICD-10-CM | POA: Diagnosis not present

## 2021-05-29 DIAGNOSIS — R7303 Prediabetes: Secondary | ICD-10-CM

## 2021-05-29 DIAGNOSIS — Z6841 Body Mass Index (BMI) 40.0 and over, adult: Secondary | ICD-10-CM

## 2021-05-29 MED ORDER — WEGOVY 1.7 MG/0.75ML ~~LOC~~ SOAJ
1.7000 mg | SUBCUTANEOUS | 0 refills | Status: DC
  Filled 2021-05-29: qty 3, 28d supply, fill #0

## 2021-05-31 ENCOUNTER — Encounter: Payer: Self-pay | Admitting: Gastroenterology

## 2021-06-01 ENCOUNTER — Other Ambulatory Visit (HOSPITAL_BASED_OUTPATIENT_CLINIC_OR_DEPARTMENT_OTHER): Payer: Self-pay

## 2021-06-01 MED FILL — Escitalopram Oxalate Tab 10 MG (Base Equiv): ORAL | 90 days supply | Qty: 90 | Fill #0 | Status: AC

## 2021-06-05 ENCOUNTER — Other Ambulatory Visit: Payer: Self-pay | Admitting: Radiology

## 2021-06-06 ENCOUNTER — Other Ambulatory Visit: Payer: Self-pay | Admitting: Student

## 2021-06-06 ENCOUNTER — Other Ambulatory Visit: Payer: Self-pay | Admitting: Radiology

## 2021-06-06 NOTE — Progress Notes (Signed)
Chief Complaint:   OBESITY Hailey Noble is here to discuss her progress with her obesity treatment plan along with follow-up of her obesity related diagnoses. Hailey Noble is on the Category 2 Plan plus 1 cup of Fair life milk and states she is following her eating plan approximately 85-90% of the time. Hailey Noble states she is bike riding for 2 hours 3 times per week.   Today's visit was #: 37 Starting weight: 212 lbs Starting date: 10/06/2017 Today's weight: 212 lbs Today's date: 05/29/2021 Total lbs lost to date: 0 Total lbs lost since last in-office visit: 0  Interim History: Hailey Noble is having epigastric issues and has been unable to eat all of the protein on there plan. Most foods in general don't sound good to her currently.   Subjective:   1. Pre-diabetes Hailey Noble is not on medications, and last A1c was 5.5.  2. At risk for hypoglycemia Hailey Noble is at increased risk for hypoglycemia due to changes in diet, diagnosis of diabetes, and/or insulin use.  Assessment/Plan:   1. Pre-diabetes Hailey Noble will continue her meal plan, and will continue to work on weight loss, exercise, and decreasing simple carbohydrates to help decrease the risk of diabetes.   2. At risk for hypoglycemia Hailey Noble was given approximately 15 minutes of counseling today regarding prevention of hypoglycemia. She was advised of symptoms of hypoglycemia. Hailey Noble was instructed to avoid skipping meals, eat regular protein rich meals and schedule low calorie snacks as needed.   Repetitive spaced learning was employed today to elicit superior memory formation and behavioral change  3. Class 3 severe obesity with serious comorbidity and body mass index (BMI) of 40.0 to 44.9 in adult, unspecified obesity type (HCC) Hailey Noble is currently in the action stage of change. As such, her goal is to continue with weight loss efforts. She has agreed to keeping a food journal and adhering to recommended goals of 1200-1300 calories and 90 grams of  protein daily.   We discussed various medication options to help Hailey Noble with her weight loss efforts and we both agreed to continue taking Wegovy, and we will refill for 1 month.  - Semaglutide-Weight Management (WEGOVY) 1.7 MG/0.75ML SOAJ; Inject 1.7 mg into the skin once a week. (Patient taking differently: Inject 1.7 mg into the skin every Sunday.)  Dispense: 3 mL; Refill: 0  Exercise goals: As is.  Behavioral modification strategies: increasing lean protein intake and no skipping meals.  Hailey Noble has agreed to follow-up with our clinic in 2 to 3 weeks. She was informed of the importance of frequent follow-up visits to maximize her success with intensive lifestyle modifications for her multiple health conditions.   Objective:   Blood pressure 134/78, pulse 87, temperature 98 F (36.7 C), height 5\' 5"  (1.651 m), weight 212 lb (96.2 kg), last menstrual period 11/22/2018, SpO2 95 %. Body mass index is 35.28 kg/m.  General: Cooperative, alert, well developed, in no acute distress. HEENT: Conjunctivae and lids unremarkable. Cardiovascular: Regular rhythm.  Lungs: Normal work of breathing. Neurologic: No focal deficits.   Lab Results  Component Value Date   CREATININE 0.56 04/25/2021   BUN 16 04/25/2021   NA 139 04/25/2021   K 4.2 04/25/2021   CL 102 04/25/2021   CO2 29 04/25/2021   Lab Results  Component Value Date   ALT 19 04/25/2021   AST 16 04/25/2021   ALKPHOS 138 (H) 04/25/2021   BILITOT 0.4 04/25/2021   Lab Results  Component Value Date   HGBA1C 5.5 04/25/2021  HGBA1C 5.5 01/24/2021   HGBA1C 5.5 10/12/2020   HGBA1C 5.4 12/02/2019   HGBA1C 5.3 10/08/2019   Lab Results  Component Value Date   INSULIN 14.9 01/24/2021   INSULIN 10.5 10/12/2020   INSULIN 10.1 04/10/2020   INSULIN 9.3 12/30/2018   INSULIN 11.1 09/21/2018   Lab Results  Component Value Date   TSH 1.670 01/24/2021   Lab Results  Component Value Date   CHOL 191 05/14/2021   HDL 48 05/14/2021    LDLCALC 116 (H) 05/14/2021   TRIG 155 (H) 05/14/2021   CHOLHDL 4.0 05/14/2021   Lab Results  Component Value Date   WBC 9.0 04/25/2021   HGB 14.2 04/25/2021   HCT 42.7 04/25/2021   MCV 88.9 04/25/2021   PLT 336.0 04/25/2021   Lab Results  Component Value Date   IRON 94 06/01/2020   TIBC 363 06/01/2020   FERRITIN 36 06/01/2020   Attestation Statements:   Reviewed by clinician on day of visit: allergies, medications, problem list, medical history, surgical history, family history, social history, and previous encounter notes.   Wilhemena Durie, am acting as transcriptionist for [employer], PA-C.  I have reviewed the above documentation for accuracy and completeness, and I agree with the above. Abby Potash, PA-C

## 2021-06-07 ENCOUNTER — Other Ambulatory Visit: Payer: Self-pay

## 2021-06-07 ENCOUNTER — Encounter (HOSPITAL_COMMUNITY): Payer: Self-pay

## 2021-06-07 ENCOUNTER — Ambulatory Visit (HOSPITAL_COMMUNITY)
Admission: RE | Admit: 2021-06-07 | Discharge: 2021-06-07 | Disposition: A | Discharge status: Home / Self Care | Payer: 59 | Source: Ambulatory Visit | Attending: Gastroenterology | Admitting: Gastroenterology

## 2021-06-07 DIAGNOSIS — K219 Gastro-esophageal reflux disease without esophagitis: Secondary | ICD-10-CM | POA: Insufficient documentation

## 2021-06-07 DIAGNOSIS — K7400 Hepatic fibrosis, unspecified: Secondary | ICD-10-CM | POA: Insufficient documentation

## 2021-06-07 DIAGNOSIS — Z79899 Other long term (current) drug therapy: Secondary | ICD-10-CM | POA: Insufficient documentation

## 2021-06-07 DIAGNOSIS — K30 Functional dyspepsia: Secondary | ICD-10-CM | POA: Diagnosis not present

## 2021-06-07 DIAGNOSIS — R1013 Epigastric pain: Secondary | ICD-10-CM | POA: Diagnosis present

## 2021-06-07 DIAGNOSIS — K625 Hemorrhage of anus and rectum: Secondary | ICD-10-CM | POA: Insufficient documentation

## 2021-06-07 DIAGNOSIS — R760 Raised antibody titer: Secondary | ICD-10-CM | POA: Diagnosis not present

## 2021-06-07 DIAGNOSIS — R748 Abnormal levels of other serum enzymes: Secondary | ICD-10-CM | POA: Diagnosis not present

## 2021-06-07 DIAGNOSIS — K759 Inflammatory liver disease, unspecified: Secondary | ICD-10-CM | POA: Diagnosis not present

## 2021-06-07 DIAGNOSIS — K7401 Hepatic fibrosis, early fibrosis: Secondary | ICD-10-CM | POA: Diagnosis not present

## 2021-06-07 DIAGNOSIS — R899 Unspecified abnormal finding in specimens from other organs, systems and tissues: Secondary | ICD-10-CM

## 2021-06-07 LAB — CBC WITH DIFFERENTIAL/PLATELET
Abs Immature Granulocytes: 0.02 10*3/uL (ref 0.00–0.07)
Basophils Absolute: 0.1 10*3/uL (ref 0.0–0.1)
Basophils Relative: 1 %
Eosinophils Absolute: 0.2 10*3/uL (ref 0.0–0.5)
Eosinophils Relative: 2 %
HCT: 42.7 % (ref 36.0–46.0)
Hemoglobin: 14 g/dL (ref 12.0–15.0)
Immature Granulocytes: 0 %
Lymphocytes Relative: 23 %
Lymphs Abs: 1.8 10*3/uL (ref 0.7–4.0)
MCH: 29.8 pg (ref 26.0–34.0)
MCHC: 32.8 g/dL (ref 30.0–36.0)
MCV: 90.9 fL (ref 80.0–100.0)
Monocytes Absolute: 0.6 10*3/uL (ref 0.1–1.0)
Monocytes Relative: 8 %
Neutro Abs: 5.4 10*3/uL (ref 1.7–7.7)
Neutrophils Relative %: 66 %
Platelets: 331 10*3/uL (ref 150–400)
RBC: 4.7 MIL/uL (ref 3.87–5.11)
RDW: 12.4 % (ref 11.5–15.5)
WBC: 8 10*3/uL (ref 4.0–10.5)
nRBC: 0 % (ref 0.0–0.2)

## 2021-06-07 LAB — PROTIME-INR
INR: 1 (ref 0.8–1.2)
Prothrombin Time: 12.7 seconds (ref 11.4–15.2)

## 2021-06-07 MED ORDER — GELATIN ABSORBABLE 12-7 MM EX MISC
CUTANEOUS | Status: AC
  Filled 2021-06-07: qty 1

## 2021-06-07 MED ORDER — SODIUM CHLORIDE 0.9 % IV SOLN: INTRAVENOUS | Status: DC

## 2021-06-07 MED ORDER — FENTANYL CITRATE (PF) 100 MCG/2ML IJ SOLN
INTRAMUSCULAR | Status: AC
  Filled 2021-06-07: qty 2

## 2021-06-07 MED ORDER — FENTANYL CITRATE (PF) 100 MCG/2ML IJ SOLN
INTRAMUSCULAR | Status: AC | PRN
  Administered 2021-06-07: 25 ug via INTRAVENOUS
  Administered 2021-06-07: 50 ug via INTRAVENOUS

## 2021-06-07 MED ORDER — LIDOCAINE HCL (PF) 1 % IJ SOLN
INTRAMUSCULAR | Status: AC
  Filled 2021-06-07: qty 30

## 2021-06-07 MED ORDER — MIDAZOLAM HCL 2 MG/2ML IJ SOLN
INTRAMUSCULAR | Status: AC
  Filled 2021-06-07: qty 2

## 2021-06-07 MED ORDER — SODIUM CHLORIDE 0.9 % IV SOLN
INTRAVENOUS | Status: AC | PRN
  Administered 2021-06-07: 10 mL/h via INTRAVENOUS

## 2021-06-07 MED ORDER — MIDAZOLAM HCL 2 MG/2ML IJ SOLN
INTRAMUSCULAR | Status: AC | PRN
  Administered 2021-06-07: 0.5 mg via INTRAVENOUS
  Administered 2021-06-07: 1 mg via INTRAVENOUS

## 2021-06-07 NOTE — Procedures (Signed)
Interventional Radiology Procedure Note  Procedure: Random liver biopsy  Indication: Elevated Alk Phos  Findings: Please refer to procedural dictation for full description.  Complications: None  EBL: < 10 mL  Miachel Roux, MD [phone]

## 2021-06-07 NOTE — H&P (Signed)
Chief Complaint: Patient was seen in consultation today for positive AMA  Referring Physician(s): Gupta,Rajesh  Supervising Physician: Mir, Sharen Heck  Patient Status: [facility] - Out-pt  History of Present Illness: Hailey Noble is a 55 y.o. female with past medical history of GERD, HLD, anxiety, fibroids s/p hysterectomy, followed by GI for gastritis, dyspepsia, gastroparesis who presents to [facility]iology today for liver biopsy to rule out biliary or pancreatic source of her abdominal pain/GERD.  She did have a positive AMA at her GI office.   Hailey Noble presents today in her usual state of health. She has been NPO.  She does have transportation arranged with her boyfriend and mother.  She reports ongoing nausea.  She is aware of the goals of the procedure today and is agreeable to proceed.  Past Medical History:  Diagnosis Date   Allergic state 12/08/2016   Allergy    Anxiety    Back pain    " i just have a weak loer back"    Fibroids    GERD (gastroesophageal reflux disease)    Hyperlipidemia 06/09/2017   Insulin resistance    Joint pain    Knee pain, right 03/10/2017   Plantar fasciitis    PONV (postoperative nausea and vomiting)    just nausea    Shortness of breath    not only if  i exert myselg    Sinusitis    Sleep apnea in adult 12/26/2020   Vitamin D deficiency 11/22/2016   Vitamin D deficiency     Past Surgical History:  Procedure Laterality Date   ABDOMINAL HYSTERECTOMY  09/2019   [place]   COLONOSCOPY  02-2009   with Henrene Pastor normal   CYSTOSCOPY N/A 10/12/2019   Procedure: CYSTOSCOPY;  Surgeon: Sherlyn Hay, DO;  Location: [place];  Service: Gynecology;  Laterality: N/A;   ESOPHAGOGASTRODUODENOSCOPY  2012   GANGLION CYST EXCISION Right    right- wrist    NASAL SEPTUM SURGERY     NASAL SINUS SURGERY     TMJ ARTHROSCOPY     left   TOTAL LAPAROSCOPIC HYSTERECTOMY WITH BILATERAL SALPINGO OOPHORECTOMY Bilateral 10/12/2019   Procedure:  TOTAL LAPAROSCOPIC HYSTERECTOMY WITH BILATERAL SALPINGO OOPHORECTOMY, Lysis Pelvic Adhseions ;  Surgeon: Sherlyn Hay, DO;  Location: [place];  Service: Gynecology;  Laterality: Bilateral;    Allergies: Coconut fatty acids, Codeine, Mango flavor, [name], Oxycodone-acetaminophen, Tussionex pennkinetic er [hydrocod polst-cpm polst er], and Ultram [tramadol]  Medications: Prior to Admission medications   Medication Sig Start Date End Date Taking? Authorizing Provider  acetaminophen (TYLENOL) 500 MG tablet Take 500 mg by mouth every 6 (six) hours as needed for headache.   Yes [provider]  buPROPion (WELLBUTRIN) 75 MG tablet TAKE 1 TABLET BY MOUTH ONCE DAILY Patient taking differently: Take 75 mg by mouth daily. 12/26/20 12/26/21 Yes Mosie Lukes, MD  Calcium Carbonate-Vitamin D (CALTRATE 600+D PO) Take 1,200 mg by mouth daily.   Yes [provider]  escitalopram (LEXAPRO) 10 MG tablet TAKE 1 TABLET BY MOUTH ONCE DAILY Patient taking differently: Take 10 mg by mouth at bedtime. 02/07/21 02/07/22 Yes Mosie Lukes, MD  fluticasone (FLONASE) 50 MCG/ACT nasal spray Place 2 sprays into both nostrils daily. Patient taking differently: Place 1 spray into both nostrils daily as needed for allergies. 04/13/21  Yes Mosie Lukes, MD  pantoprazole (PROTONIX) 40 MG tablet Take 1 tablet (40 mg total) by mouth daily. 04/25/21  Yes Jackquline Denmark, MD  Polyethyl Glycol-Propyl Glycol (SYSTANE) 0.4-0.3 % SOLN Place 1 drop into both eyes daily as needed (Allergies).   Yes [provider]  Semaglutide-Weight Management (WEGOVY) 1.7 MG/0.75ML SOAJ Inject 1.7 mg into the skin once a week. Patient taking differently: Inject 1.7 mg into the skin every Sunday. 05/29/21  Yes Abby Potash, PA-C  Vitamin D, Cholecalciferol, 25 MCG (1000 UT) TABS Take 4,000 units daily Patient taking differently: Take 4,000 Units by mouth daily. 03/21/21  Yes Abby Potash, PA-C      Family History  Problem Relation Age of Onset   Hypertension Father    Diabetes Father    Hyperlipidemia Father    Breast cancer Mother 50   Cancer Mother        breast   Thyroid disease Mother    Sleep apnea Mother    Colon cancer Paternal Grandfather        died at age 58   Cancer Paternal Grandfather        GI CA   Obesity Sister    Diabetes Sister    Hypertension Sister    Heart disease Maternal Grandmother        MI   Cancer Maternal Grandfather        lung, smoker   Diabetes Maternal Grandfather    Diabetes Paternal Grandmother    Heart disease Paternal Grandmother    Pancreatitis Sister    Esophageal cancer Neg Hx    Stomach cancer Neg Hx    Stroke Neg Hx    Colon polyps Neg Hx    Rectal cancer Neg Hx    Pancreatic cancer Neg Hx     Social History   Socioeconomic History   Marital status: Single    Spouse name: Not on file   Number of children: Not on file   Years of education: Not on file   Highest education level: Not on file  Occupational History   Occupation: Programmer, multimedia: [employer]  Tobacco Use   Smoking status: Never   Smokeless tobacco: Never  Vaping Use   Vaping Use: Never used  Substance and Sexual Activity   Alcohol use: Yes    Alcohol/week: 0.0 standard drinks    Comment: rare   Drug use: No   Sexual activity: Yes    Birth control/protection: I.U.D.  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 Works at [employer]. No dietary restrictions. Lives with dog   Social Determinants of Health   Financial Resource Strain: Not on file  Food Insecurity: Not on file  Transportation Needs: Not on file  Physical Activity: Not on file  Stress: Not on file  Social Connections: Not on file     Review of Systems: A 12 point ROS discussed and pertinent positives are indicated in the HPI above.  All other systems are negative.  Review of  Systems  Constitutional:  Negative for fatigue and fever.  Respiratory:  Negative for cough and shortness of breath.   Cardiovascular:  Negative for chest pain.  Gastrointestinal:  Positive for nausea. Negative for abdominal distention, abdominal pain and vomiting.  Musculoskeletal:  Negative for back pain.  Psychiatric/Behavioral:  Negative for behavioral problems and confusion.    Vital Signs: BP 124/68   Pulse 81   Temp 98.2 F (36.8 C) (Oral)   Resp 16   Ht 5\' 5"  (1.651 m)  Wt 210 lb (95.3 kg)   LMP 11/22/2018 (Approximate)   SpO2 98%   BMI 34.95 kg/m   Physical Exam Vitals and nursing note reviewed.  Constitutional:      General: She is not in acute distress.    Appearance: Normal appearance. She is not ill-appearing.  HENT:     Mouth/Throat:     Mouth: Mucous membranes are moist.     Pharynx: Oropharynx is clear.  Cardiovascular:     Rate and Rhythm: Normal rate and regular rhythm.  Pulmonary:     Effort: Pulmonary effort is normal. No respiratory distress.     Breath sounds: Normal breath sounds.  Abdominal:     General: Abdomen is flat.     Palpations: Abdomen is soft.  Neurological:     General: No focal deficit present.     Mental Status: She is alert and oriented to person, place, and time. Mental status is at baseline.  Psychiatric:        Mood and Affect: Mood normal.        Behavior: Behavior normal.        Thought Content: Thought content normal.        Judgment: Judgment normal.         Imaging: No results found.  Labs:  CBC: Recent Labs    04/25/21 1053 06/07/21 0615  WBC 9.0 8.0  HGB 14.2 14.0  HCT 42.7 42.7  PLT 336.0 331    COAGS: Recent Labs    05/22/21 0847 06/07/21 0615  INR 1.0 1.0    BMP: Recent Labs    10/12/20 0843 01/24/21 0843 04/25/21 1053  NA 141 143 139  K 4.5 4.7 4.2  CL 103 105 102  CO2 26 23 29   GLUCOSE 82 96 87  BUN 17 16 16   CALCIUM 9.4 9.3 9.3  CREATININE 0.63 0.52* 0.56  GFRNONAA 101 108   --   GFRAA 117 124  --     LIVER FUNCTION TESTS: Recent Labs    10/12/20 0843 01/24/21 0843 04/25/21 1053  BILITOT 0.3 0.2 0.4  AST 15 14 16   ALT 19 17 19   ALKPHOS 144* 140* 138*  PROT 6.8 6.8 7.0  ALBUMIN 4.5 4.3 4.4    TUMOR MARKERS: No results for input(s): AFPTM, CEA, CA199, CHROMGRNA in the last 8760 hours.  Assessment and Plan: Patient with past medical history of GERD, dyspepsia presents with complaint of positive AMA.  IR consulted for random liver biopsy at the request of Dr. Lyndel Safe. Case reviewed by Dr. Dwaine Gale who approves patient for procedure.  Patient presents today in their usual state of health.  She has been NPO and is not currently on blood thinners.   Risks and benefits of biopsy was discussed with the patient and/or patient's family including, but not limited to bleeding, infection, damage to adjacent structures or low yield requiring additional tests.  All of the questions were answered and there is agreement to proceed.  Consent signed and in chart.  Thank you for this interesting consult.  I greatly enjoyed [place] and look forward to participating in their care.  A copy of this report was sent to the requesting provider on this date.  Electronically Signed: Docia Barrier, PA 06/07/2021, 7:29 AM   I spent a total of  30 Minutes   in face to face in clinical consultation, greater than 50% of which was counseling/coordinating care for positive AMA, nausea, dyspepsia.

## 2021-06-08 ENCOUNTER — Other Ambulatory Visit (HOSPITAL_BASED_OUTPATIENT_CLINIC_OR_DEPARTMENT_OTHER): Payer: Self-pay

## 2021-06-08 MED ORDER — CARESTART COVID-19 HOME TEST VI KIT
PACK | 0 refills | Status: DC
  Filled 2021-06-08: qty 4, 4d supply, fill #0

## 2021-06-11 LAB — SURGICAL PATHOLOGY

## 2021-06-13 ENCOUNTER — Encounter: Payer: Self-pay | Admitting: Certified Registered Nurse Anesthetist

## 2021-06-14 ENCOUNTER — Encounter: Payer: Self-pay | Admitting: Gastroenterology

## 2021-06-14 ENCOUNTER — Ambulatory Visit (AMBULATORY_SURGERY_CENTER): Payer: 59 | Admitting: Gastroenterology

## 2021-06-14 ENCOUNTER — Other Ambulatory Visit: Payer: Self-pay

## 2021-06-14 VITALS — BP 146/75 | HR 82 | Temp 97.8°F | Resp 17 | Ht 65.0 in | Wt 214.0 lb

## 2021-06-14 DIAGNOSIS — K317 Polyp of stomach and duodenum: Secondary | ICD-10-CM | POA: Diagnosis not present

## 2021-06-14 DIAGNOSIS — G4733 Obstructive sleep apnea (adult) (pediatric): Secondary | ICD-10-CM | POA: Diagnosis not present

## 2021-06-14 DIAGNOSIS — K2970 Gastritis, unspecified, without bleeding: Secondary | ICD-10-CM | POA: Diagnosis not present

## 2021-06-14 DIAGNOSIS — K2990 Gastroduodenitis, unspecified, without bleeding: Secondary | ICD-10-CM

## 2021-06-14 DIAGNOSIS — R1110 Vomiting, unspecified: Secondary | ICD-10-CM | POA: Diagnosis not present

## 2021-06-14 DIAGNOSIS — K625 Hemorrhage of anus and rectum: Secondary | ICD-10-CM | POA: Diagnosis not present

## 2021-06-14 DIAGNOSIS — R1013 Epigastric pain: Secondary | ICD-10-CM

## 2021-06-14 DIAGNOSIS — R1310 Dysphagia, unspecified: Secondary | ICD-10-CM | POA: Diagnosis not present

## 2021-06-14 DIAGNOSIS — R112 Nausea with vomiting, unspecified: Secondary | ICD-10-CM

## 2021-06-14 MED ORDER — SODIUM CHLORIDE 0.9 % IV SOLN: 500.0000 mL | Freq: Once | INTRAVENOUS | Status: DC

## 2021-06-14 NOTE — Progress Notes (Signed)
Vital signs checked by:CW ? ?The medical and surgical history was reviewed and verified with the patient. ? ?

## 2021-06-14 NOTE — Op Note (Signed)
[place] Patient Name: Hailey Noble Procedure Date: 06/14/2021 11:14 AM MRN: [id number] Endoscopist: Jackquline Denmark , MD Age: 55 Referring MD:  Date of Birth: October 28, 1966 Gender: Female Account #: [id number] Procedure:                Colonoscopy Indications:              Rectal bleeding. H/O colonic polyps. Medicines:                Monitored Anesthesia Care Procedure:                Pre-Anesthesia Assessment:                           - Prior to the procedure, a History and Physical                            was performed, and patient medications and                            allergies were reviewed. The patient's tolerance of                            previous anesthesia was also reviewed. The risks                            and benefits of the procedure and the sedation                            options and risks were discussed with the patient.                            All questions were answered, and informed consent                            was obtained. Prior Anticoagulants: The patient has                            taken no previous anticoagulant or antiplatelet                            agents. ASA Grade Assessment: II - A patient with                            mild systemic disease. After reviewing the risks                            and benefits, the patient was deemed in                            satisfactory condition to undergo the procedure.                           After obtaining informed consent, the colonoscope  was passed under direct vision. Throughout the                            procedure, the patient's blood pressure, pulse, and                            oxygen saturations were monitored continuously. The                            Olympus CF-HQ190L [id number]) Colonoscope was                            introduced through the anus and advanced to the the                            cecum, identified by  appendiceal orifice and                            ileocecal valve. The colonoscopy was performed                            without difficulty. The patient tolerated the                            procedure well. The quality of the bowel                            preparation was adequate to identify polyps. There                            was retained solid stool in the cecum limiting                            examination. Despite of aggressive suctioning                            aspiration, it could not be cleared. It would                            preclude TI intubation. There was some retained                            stool and preparation in the transverse and                            descending colon as well. Overall over 90 to 95% of                            the colonic mucosa was visualized satisfactorily.                            The ileocecal valve, appendiceal orifice, and  rectum were photographed. Scope In: 11:27:57 AM Scope Out: 11:39:33 AM Scope Withdrawal Time: 0 hours 7 minutes 48 seconds  Total Procedure Duration: 0 hours 11 minutes 36 seconds  Findings:                 The colon (entire examined portion) was somewhat                            redundant but normal.                           Non-bleeding internal hemorrhoids were found during                            retroflexion. The hemorrhoids were small.                           The exam was otherwise without abnormality on                            direct and retroflexion views. Complications:            No immediate complications. Estimated Blood Loss:     Estimated blood loss: none. Impression:               - Non-bleeding internal hemorrhoids.                           - Otherwise grossly normal colonoscopy to cecum.                           - No specimens collected. Recommendation:           - Patient has a contact number available for                             emergencies. The signs and symptoms of potential                            delayed complications were discussed with the                            patient. Return to normal activities tomorrow.                            Written discharge instructions were provided to the                            patient.                           - Resume previous diet.                           - MiraLAX 17 g p.o. once a day.                           - Continue present medications.                           -  Repeat colonoscopy in 5 years for screening                            purposes d/t quality of preparation/previous                            history of polyp with 2-day prep. Earlier, if                            clinically indicated.                           - The findings and recommendations were discussed                            with the designated responsible adult. Jackquline Denmark, MD 06/14/2021 11:46:30 AM This report has been signed electronically.

## 2021-06-14 NOTE — Progress Notes (Signed)
Called to room to assist during endoscopic procedure.  Patient ID and intended procedure confirmed with present staff. Received instructions for my participation in the procedure from the performing physician.  

## 2021-06-14 NOTE — Progress Notes (Signed)
Report given to PACU, vss 

## 2021-06-14 NOTE — Op Note (Signed)
[place] Patient Name: Hailey Noble Procedure Date: 06/14/2021 11:14 AM MRN: [id number] Endoscopist: Jackquline Denmark , MD Age: 55 Referring MD:  Date of Birth: Jun 01, 1966 Gender: Female Account #: [id number] Procedure:                Upper GI endoscopy Indications:              N/V Medicines:                Monitored Anesthesia Care Procedure:                Pre-Anesthesia Assessment:                           - Prior to the procedure, a History and Physical                            was performed, and patient medications and                            allergies were reviewed. The patient's tolerance of                            previous anesthesia was also reviewed. The risks                            and benefits of the procedure and the sedation                            options and risks were discussed with the patient.                            All questions were answered, and informed consent                            was obtained. Prior Anticoagulants: The patient has                            taken no previous anticoagulant or antiplatelet                            agents. ASA Grade Assessment: II - A patient with                            mild systemic disease. After reviewing the risks                            and benefits, the patient was deemed in                            satisfactory condition to undergo the procedure.                           After obtaining informed consent, the endoscope was  passed under direct vision. Throughout the                            procedure, the patient's blood pressure, pulse, and                            oxygen saturations were monitored continuously. The                            Endoscope was introduced through the mouth, and                            advanced to the second part of duodenum. The upper                            GI endoscopy was accomplished without difficulty.                             The patient tolerated the procedure well. Scope In: Scope Out: Findings:                 The examined esophagus was normal with well-defined                            Z-line at 40 cm.                           Localized mild inflammation characterized by                            erythema was found in the gastric antrum. Biopsies                            were taken with a cold forceps for histology.                            Stomach was J-shaped.                           A few (8-10) 1 to 2 mm sessile polyps with no                            bleeding and no stigmata of recent bleeding were                            found in the gastric body. 3 polyps were removed                            with a cold biopsy forceps. Resection and retrieval                            were complete.                           The examined  duodenum was normal. Biopsies for                            histology were taken with a cold forceps for                            evaluation of celiac disease. Complications:            No immediate complications. Estimated Blood Loss:     Estimated blood loss: none. Impression:               - Gastritis. Biopsied.                           - A few gastric polyps. Resected and retrieved x 3. Recommendation:           - Patient has a contact number available for                            emergencies. The signs and symptoms of potential                            delayed complications were discussed with the                            patient. Return to normal activities tomorrow.                            Written discharge instructions were provided to the                            patient.                           - Resume previous diet.                           - Continue present medications.                           - Await pathology results.                           - The findings and recommendations were discussed                             with the patient's family. Jackquline Denmark, MD 06/14/2021 11:41:54 AM This report has been signed electronically.

## 2021-06-14 NOTE — Patient Instructions (Signed)
YOU HAD AN ENDOSCOPIC PROCEDURE TODAY AT [facility]:   Refer to the procedure report that was given to you for any specific questions about what was found during the examination.  If the procedure report does not answer your questions, please call your gastroenterologist to clarify.  If you requested that your care partner not be given the details of your procedure findings, then the procedure report has been included in a sealed envelope for you to review at your convenience later.  YOU SHOULD EXPECT: Some feelings of bloating in the abdomen. Passage of more gas than usual.  Walking can help get rid of the air that was put into your GI tract during the procedure and reduce the bloating. If you had a lower endoscopy (such as a colonoscopy or flexible sigmoidoscopy) you may notice spotting of blood in your stool or on the toilet paper. If you underwent a bowel prep for your procedure, you may not have a normal bowel movement for a few days.  Please Note:  You might notice some irritation and congestion in your nose or some drainage.  This is from the oxygen used during your procedure.  There is no need for concern and it should clear up in a day or so.  SYMPTOMS TO REPORT IMMEDIATELY:  Following lower endoscopy (colonoscopy or flexible sigmoidoscopy):  Excessive amounts of blood in the stool  Significant tenderness or worsening of abdominal pains  Swelling of the abdomen that is new, acute  Fever of 100F or higher  Following upper endoscopy (EGD)  Vomiting of blood or coffee ground material  New chest pain or pain under the shoulder blades  Painful or persistently difficult swallowing  New shortness of breath  Fever of 100F or higher  Black, tarry-looking stools  For urgent or emergent issues, a gastroenterologist can be reached at any hour by calling [phone]. Do not use MyChart messaging for urgent concerns.    DIET:  We do recommend a small meal at first, but  then you may proceed to your regular diet.  Drink plenty of fluids but you should avoid alcoholic beverages for 24 hours.  ACTIVITY:  You should plan to take it easy for the rest of today and you should NOT DRIVE or use heavy machinery until tomorrow (because of the sedation medicines used during the test).    FOLLOW UP: Our staff will call the number listed on your records 48-72 hours following your procedure to check on you and address any questions or concerns that you may have regarding the information given to you following your procedure. If we do not reach you, we will leave a message.  We will attempt to reach you two times.  During this call, we will ask if you have developed any symptoms of COVID 19. If you develop any symptoms (ie: fever, flu-like symptoms, shortness of breath, cough etc.) before then, please call [phone].  If you test positive for Covid 19 in the 2 weeks post procedure, please call and report this information to [name].    If any biopsies were taken you will be contacted by phone or by letter within the next 1-3 weeks.  Please call us at [phone] if you have not heard about the biopsies in 3 weeks.    SIGNATURES/CONFIDENTIALITY: You and/or your care partner have signed paperwork which will be entered into your electronic medical record.  These signatures attest to the fact that that the information above on your After  Visit Summary has been reviewed and is understood.  Full responsibility of the confidentiality of this discharge information lies with you and/or your care-partner.     RESUME MEDICATIONS. RECOMMEND MIRALAX 17G BY MOUTH ONCE DAILY. iNFORMATION GIVEN ON HEMORRHOIDS AND GASTRITIS.

## 2021-06-18 ENCOUNTER — Encounter (INDEPENDENT_AMBULATORY_CARE_PROVIDER_SITE_OTHER): Payer: Self-pay | Admitting: Physician Assistant

## 2021-06-18 ENCOUNTER — Other Ambulatory Visit: Payer: Self-pay

## 2021-06-18 ENCOUNTER — Ambulatory Visit (INDEPENDENT_AMBULATORY_CARE_PROVIDER_SITE_OTHER): Payer: 59 | Admitting: Physician Assistant

## 2021-06-18 ENCOUNTER — Other Ambulatory Visit (HOSPITAL_BASED_OUTPATIENT_CLINIC_OR_DEPARTMENT_OTHER): Payer: Self-pay

## 2021-06-18 VITALS — BP 134/73 | HR 89 | Temp 97.6°F | Ht 65.0 in | Wt 209.0 lb

## 2021-06-18 DIAGNOSIS — Z9189 Other specified personal risk factors, not elsewhere classified: Secondary | ICD-10-CM

## 2021-06-18 DIAGNOSIS — E7849 Other hyperlipidemia: Secondary | ICD-10-CM

## 2021-06-18 DIAGNOSIS — G4733 Obstructive sleep apnea (adult) (pediatric): Secondary | ICD-10-CM | POA: Diagnosis not present

## 2021-06-18 DIAGNOSIS — Z6841 Body Mass Index (BMI) 40.0 and over, adult: Secondary | ICD-10-CM

## 2021-06-18 MED ORDER — WEGOVY 1.7 MG/0.75ML ~~LOC~~ SOAJ
1.7000 mg | SUBCUTANEOUS | 0 refills | Status: DC
  Filled 2021-06-18 – 2021-06-20 (×2): qty 3, 28d supply, fill #0

## 2021-06-19 ENCOUNTER — Telehealth: Payer: Self-pay | Admitting: *Deleted

## 2021-06-19 ENCOUNTER — Other Ambulatory Visit (HOSPITAL_BASED_OUTPATIENT_CLINIC_OR_DEPARTMENT_OTHER): Payer: Self-pay

## 2021-06-19 NOTE — Telephone Encounter (Signed)
Attempted 2nd f/u phone call. No answer. Left message.  °

## 2021-06-19 NOTE — Telephone Encounter (Signed)
No answer for post procedure call back. Left message for patient.

## 2021-06-20 ENCOUNTER — Other Ambulatory Visit (HOSPITAL_BASED_OUTPATIENT_CLINIC_OR_DEPARTMENT_OTHER): Payer: Self-pay

## 2021-06-20 NOTE — Progress Notes (Signed)
Chief Complaint:   OBESITY Hailey Noble is here to discuss her progress with her obesity treatment plan along with follow-up of her obesity related diagnoses. Hailey Noble is on keeping a food journal and adhering to recommended goals of 1200-1300 calories and 90 grams of protein daily and states she is following her eating plan approximately 90% of the time. Hailey Noble states she is walking and lifting weights for 30-45 minutes 3 times per week.  Today's visit was #: 27 Starting weight: 212 lbs Starting date: 10/06/2017 Today's weight: 209 lbs Today's date: 06/18/2021 Total lbs lost to date: 3 Total lbs lost since last in-office visit: 3  Interim History: Hailey Noble recently had a liver biopsy, but she does not have the results as of yet. She is no longer nauseous as she stopped eating spicy foods. Her hunger is controlled with spreading out her foods.  Subjective:   1. Other hyperlipidemia Hailey Noble is not on medications. Her last lipid panel was not at goal. She is exercising regularly.  2. At risk for heart disease Hailey Noble is at a higher than average risk for cardiovascular disease due to obesity.   Assessment/Plan:   1. Other hyperlipidemia Cardiovascular risk and specific lipid/LDL goals reviewed. We discussed several lifestyle modifications today. Hailey Noble will continue her meal plan and exercise. Orders and follow up as documented in patient record.   Counseling Intensive lifestyle modifications are the first line treatment for this issue. Dietary changes: Increase soluble fiber. Decrease simple carbohydrates. Exercise changes: Moderate to vigorous-intensity aerobic activity 150 minutes per week if tolerated. Lipid-lowering medications: see documented in medical record.  2. At risk for heart disease Hailey Noble was given approximately 15 minutes of coronary artery disease prevention counseling today. She is 55 y.o. female and has risk factors for heart disease including obesity. We discussed  intensive lifestyle modifications today with an emphasis on specific weight loss instructions and strategies.   Repetitive spaced learning was employed today to elicit superior memory formation and behavioral change.  3. Class 3 severe obesity with serious comorbidity and body mass index (BMI) of 40.0 to 44.9 in adult, unspecified obesity type (HCC) Hailey Noble is currently in the action stage of change. As such, her goal is to continue with weight loss efforts. She has agreed to the Category 2 Plan.   We discussed various medication options to help Hailey Noble with her weight loss efforts and we both agreed to continue [place], and we will refill for 1 month.  - Semaglutide-Weight Management (WEGOVY) 1.7 MG/0.75ML SOAJ; Inject 1.7 mg into the skin once a week.  Dispense: 3 mL; Refill: 0  Exercise goals: As is.  Behavioral modification strategies: meal planning and cooking strategies and keeping healthy foods in the home.  Hailey Noble has agreed to follow-up with our clinic in 5 weeks. She was informed of the importance of frequent follow-up visits to maximize her success with intensive lifestyle modifications for her multiple health conditions.   Objective:   Blood pressure 134/73, pulse 89, temperature 97.6 F (36.4 C), height 5\' 5"  (1.651 m), weight 209 lb (94.8 kg), last menstrual period 11/22/2018, SpO2 97 %. Body mass index is 34.78 kg/m.  General: Cooperative, alert, well developed, in no acute distress. HEENT: Conjunctivae and lids unremarkable. Cardiovascular: Regular rhythm.  Lungs: Normal work of breathing. Neurologic: No focal deficits.   Lab Results  Component Value Date   CREATININE 0.56 04/25/2021   BUN 16 04/25/2021   NA 139 04/25/2021   K 4.2 04/25/2021   CL 102  04/25/2021   CO2 29 04/25/2021   Lab Results  Component Value Date   ALT 19 04/25/2021   AST 16 04/25/2021   ALKPHOS 138 (H) 04/25/2021   BILITOT 0.4 04/25/2021   Lab Results  Component Value Date   HGBA1C 5.5  04/25/2021   HGBA1C 5.5 01/24/2021   HGBA1C 5.5 10/12/2020   HGBA1C 5.4 12/02/2019   HGBA1C 5.3 10/08/2019   Lab Results  Component Value Date   INSULIN 14.9 01/24/2021   INSULIN 10.5 10/12/2020   INSULIN 10.1 04/10/2020   INSULIN 9.3 12/30/2018   INSULIN 11.1 09/21/2018   Lab Results  Component Value Date   TSH 1.670 01/24/2021   Lab Results  Component Value Date   CHOL 191 05/14/2021   HDL 48 05/14/2021   LDLCALC 116 (H) 05/14/2021   TRIG 155 (H) 05/14/2021   CHOLHDL 4.0 05/14/2021   Lab Results  Component Value Date   VD25OH 45.5 05/14/2021   VD25OH 46.0 01/24/2021   VD25OH 25.8 (L) 10/12/2020   Lab Results  Component Value Date   WBC 8.0 06/07/2021   HGB 14.0 06/07/2021   HCT 42.7 06/07/2021   MCV 90.9 06/07/2021   PLT 331 06/07/2021   Lab Results  Component Value Date   IRON 94 06/01/2020   TIBC 363 06/01/2020   FERRITIN 36 06/01/2020   Attestation Statements:   Reviewed by clinician on day of visit: allergies, medications, problem list, medical history, surgical history, family history, social history, and previous encounter notes.   Wilhemena Durie, am acting as transcriptionist for [employer], PA-C.  I have reviewed the above documentation for accuracy and completeness, and I agree with the above. Abby Potash, PA-C

## 2021-06-22 ENCOUNTER — Other Ambulatory Visit: Payer: Self-pay

## 2021-06-22 ENCOUNTER — Encounter: Payer: 59 | Admitting: Registered"

## 2021-06-22 ENCOUNTER — Other Ambulatory Visit (HOSPITAL_BASED_OUTPATIENT_CLINIC_OR_DEPARTMENT_OTHER): Payer: Self-pay

## 2021-06-22 DIAGNOSIS — R7989 Other specified abnormal findings of blood chemistry: Secondary | ICD-10-CM

## 2021-06-22 MED ORDER — URSODIOL 500 MG PO TABS
500.0000 mg | ORAL_TABLET | Freq: Two times a day (BID) | ORAL | 11 refills | Status: DC
  Filled 2021-06-22: qty 60, 30d supply, fill #0
  Filled 2021-07-22: qty 60, 30d supply, fill #1
  Filled 2021-08-19: qty 60, 30d supply, fill #2
  Filled 2021-09-20: qty 60, 30d supply, fill #3
  Filled 2021-10-29 – 2021-10-30 (×2): qty 60, 30d supply, fill #4
  Filled 2021-12-03: qty 60, 30d supply, fill #5
  Filled 2022-01-11: qty 60, 30d supply, fill #6
  Filled 2022-02-09: qty 60, 30d supply, fill #7
  Filled 2022-03-16: qty 60, 30d supply, fill #8
  Filled 2022-04-17: qty 60, 30d supply, fill #9
  Filled 2022-06-13: qty 60, 30d supply, fill #10

## 2021-06-26 ENCOUNTER — Other Ambulatory Visit (HOSPITAL_BASED_OUTPATIENT_CLINIC_OR_DEPARTMENT_OTHER): Payer: Self-pay

## 2021-06-26 ENCOUNTER — Ambulatory Visit (INDEPENDENT_AMBULATORY_CARE_PROVIDER_SITE_OTHER): Payer: 59 | Admitting: Gastroenterology

## 2021-06-26 DIAGNOSIS — Z23 Encounter for immunization: Secondary | ICD-10-CM | POA: Diagnosis not present

## 2021-06-26 NOTE — Progress Notes (Deleted)
Mm36

## 2021-06-27 ENCOUNTER — Other Ambulatory Visit (HOSPITAL_BASED_OUTPATIENT_CLINIC_OR_DEPARTMENT_OTHER): Payer: Self-pay

## 2021-07-03 ENCOUNTER — Encounter: Payer: Self-pay | Admitting: Family

## 2021-07-03 ENCOUNTER — Other Ambulatory Visit: Payer: Self-pay

## 2021-07-03 ENCOUNTER — Telehealth (INDEPENDENT_AMBULATORY_CARE_PROVIDER_SITE_OTHER): Payer: 59 | Admitting: Family

## 2021-07-03 ENCOUNTER — Other Ambulatory Visit (HOSPITAL_BASED_OUTPATIENT_CLINIC_OR_DEPARTMENT_OTHER): Payer: Self-pay

## 2021-07-03 VITALS — Ht 65.0 in | Wt 207.0 lb

## 2021-07-03 DIAGNOSIS — U071 COVID-19: Secondary | ICD-10-CM

## 2021-07-03 MED ORDER — MOLNUPIRAVIR EUA 200MG CAPSULE
4.0000 | ORAL_CAPSULE | Freq: Two times a day (BID) | ORAL | 0 refills | Status: AC
  Filled 2021-07-03: qty 40, 5d supply, fill #0

## 2021-07-03 MED ORDER — LIDOCAINE VISCOUS HCL 2 % MT SOLN
5.0000 mL | Freq: Four times a day (QID) | OROMUCOSAL | 0 refills | Status: DC | PRN
Start: 2021-07-03 — End: 2021-07-27
  Filled 2021-07-03: qty 100, 5d supply, fill #0

## 2021-07-03 NOTE — Progress Notes (Signed)
Hailey Noble is a 55 y.o. female with the following history as recorded in Epic[employer]:  Patient Active Problem List   Diagnosis Date Noted   Rash and other nonspecific skin eruption 01/30/2021   Pruritus 01/30/2021   Other rhinitis 01/30/2021   Adverse reaction to food, subsequent encounter 01/30/2021   Sleep apnea in adult 12/26/2020   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 06/01/2020   Left arm pain 06/01/2020   Peroneal tendinitis of left lower leg 02/16/2020   Leukocytosis 12/02/2019   Pain of left heel 12/02/2019   Pelvic pain 10/12/2019   Post-operative state 10/12/2019   Pain, abdominal, RLQ 11/28/2017   Insulin resistance 10/21/2017   Hyperlipidemia 06/09/2017   Knee pain, right 03/10/2017   Allergic state 12/08/2016   Vitamin D deficiency 11/22/2016   Depression with anxiety 04/06/2014   Obesity (BMI 30-39.9) 04/06/2014   Preventative health care 06/17/2011   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     buPROPion (WELLBUTRIN) 75 MG tablet TAKE 1 TABLET BY MOUTH ONCE DAILY (Patient taking differently: Take 75 mg by mouth daily.) 90 tablet 1   Calcium Carbonate-Vitamin D (CALTRATE 600+D PO) Take 1,200 mg by mouth daily.     escitalopram (LEXAPRO) 10 MG tablet TAKE 1 TABLET BY MOUTH ONCE DAILY (Patient taking differently: Take 10 mg by mouth at bedtime.) 90 tablet 3   fluticasone (FLONASE) 50 MCG/ACT nasal spray Place 2 sprays into both nostrils daily. (Patient taking differently: Place 1 spray into both nostrils daily as needed for allergies.) 16 g 5   lidocaine (XYLOCAINE) 2 % solution Use as directed 5 mLs in the mouth or throat every 6 (six) hours as needed for mouth pain. 100 mL 0   molnupiravir EUA 200 mg CAPS Take 4 capsules (800 mg total)  by mouth 2 (two) times daily for 5 days. 40 capsule 0   pantoprazole (PROTONIX) 40 MG tablet Take 1 tablet (40 mg total) by mouth daily. 90 tablet 3   Polyethyl Glycol-Propyl Glycol (SYSTANE) 0.4-0.3 % SOLN Place 1 drop into both eyes daily as needed (Allergies).     Semaglutide-Weight Management (WEGOVY) 1.7 MG/0.75ML SOAJ Inject 1.7 mg into the skin once a week. 3 mL 0   ursodiol (URSO FORTE) 500 MG tablet Take 1 tablet (500 mg total) by mouth in the morning and at bedtime. 60 tablet 11   Vitamin D, Cholecalciferol, 25 MCG (1000 UT) TABS Take 4,000 units daily (Patient taking differently: Take 4,000 Units by mouth daily.) 60 tablet    No current facility-administered medications for this visit.    Allergies: Coconut fatty acids, Codeine, Mango flavor, [name], Oxycodone-acetaminophen, Tussionex pennkinetic er [hydrocod polst-cpm polst er], and Ultram [tramadol]  Past Medical History:  Diagnosis Date   Allergic state 12/08/2016   Allergy    Anxiety    Back pain    " i just have a weak loer back"    Fibroids    GERD (gastroesophageal reflux disease)    Hyperlipidemia 06/09/2017   Insulin resistance    Joint pain    Knee pain, right 03/10/2017   Plantar fasciitis    PONV (postoperative nausea and vomiting)    just nausea    Shortness of breath    not only  if  i exert myselg    Sinusitis    Sleep apnea    Sleep apnea in adult 12/26/2020   Vitamin D deficiency 11/22/2016   Vitamin D deficiency     Past Surgical History:  Procedure Laterality Date   ABDOMINAL HYSTERECTOMY  09/2019   [facility]   COLONOSCOPY  02-2009   with Henrene Pastor normal   CYSTOSCOPY N/A 10/12/2019   Procedure: CYSTOSCOPY;  Surgeon: Sherlyn Hay, DO;  Location: [facility];  Service: Gynecology;  Laterality: N/A;   ESOPHAGOGASTRODUODENOSCOPY  2012   GANGLION CYST EXCISION Right    right- wrist    NASAL SEPTUM SURGERY     NASAL SINUS SURGERY     TMJ ARTHROSCOPY     left   TOTAL  LAPAROSCOPIC HYSTERECTOMY WITH BILATERAL SALPINGO OOPHORECTOMY Bilateral 10/12/2019   Procedure: TOTAL LAPAROSCOPIC HYSTERECTOMY WITH BILATERAL SALPINGO OOPHORECTOMY, Lysis Pelvic Adhseions ;  Surgeon: Sherlyn Hay, DO;  Location: [facility];  Service: Gynecology;  Laterality: Bilateral;    Family History  Problem Relation Age of Onset   Hypertension Father    Diabetes Father    Hyperlipidemia Father    Breast cancer Mother 17   Cancer Mother        breast   Thyroid disease Mother    Sleep apnea Mother    Colon cancer Paternal Grandfather        died at age 54   Cancer Paternal Grandfather        GI CA   Obesity Sister    Diabetes Sister    Hypertension Sister    Heart disease Maternal Grandmother        MI   Cancer Maternal Grandfather        lung, smoker   Diabetes Maternal Grandfather    Diabetes Paternal Grandmother    Heart disease Paternal Grandmother    Pancreatitis Sister    Esophageal cancer Neg Hx    Stomach cancer Neg Hx    Stroke Neg Hx    Colon polyps Neg Hx    Rectal cancer Neg Hx    Pancreatic cancer Neg Hx     Social History   Tobacco Use   Smoking status: Never   Smokeless tobacco: Never  Substance Use Topics   Alcohol use: Yes    Alcohol/week: 0.0 standard drinks    Comment: rare    Subjective:   I connected with Hebert Soho on 07/03/21 at  2:20 PM EDT by a telephone call and verified that I am speaking with the correct person using two identifiers.   I discussed the limitations of evaluation and management by telemedicine and the availability of in person appointments. The patient expressed understanding and agreed to proceed. Provider in office/ patient is at home; provider and patient are only 2 people on telephone call.   Started on Sunday with congestion, sore throat, sinus congestion; was working in moldy basement over the weekend and thought that was source of symptoms; did get COVID test through employer yesterday  and did test positive;     Objective:  Vitals:   07/03/21 1420  Weight: 207 lb (93.9 kg)  Height: 5\' 5"  (1.651 m)    Lungs: Respirations unlabored;  Neurologic: Alert and oriented; speech intact;   Assessment:  1. COVID-19     Plan:  Rx for Molnupiravir- use as directed; Rx for Viscous Lidocaine- use as directed;   Time spent 10 minutes  No follow-ups on file.  No orders of the defined types were placed in this encounter.   Requested Prescriptions   Signed Prescriptions Disp Refills   molnupiravir EUA 200 mg CAPS 40 capsule 0    Sig: Take 4 capsules (800 mg total) by mouth 2 (two) times daily for 5 days.   lidocaine (XYLOCAINE) 2 % solution 100 mL 0    Sig: Use as directed 5 mLs in the mouth or throat every 6 (six) hours as needed for mouth pain.

## 2021-07-08 NOTE — Progress Notes (Signed)
ForVaccine

## 2021-07-09 ENCOUNTER — Encounter: Payer: 59 | Admitting: Family Medicine

## 2021-07-10 ENCOUNTER — Ambulatory Visit (INDEPENDENT_AMBULATORY_CARE_PROVIDER_SITE_OTHER): Payer: 59 | Admitting: Physician Assistant

## 2021-07-15 ENCOUNTER — Emergency Department (HOSPITAL_BASED_OUTPATIENT_CLINIC_OR_DEPARTMENT_OTHER)
Admission: EM | Admit: 2021-07-15 | Discharge: 2021-07-15 | Disposition: A | Discharge status: Home / Self Care | Payer: 59 | Attending: Emergency Medicine | Admitting: Emergency Medicine

## 2021-07-15 ENCOUNTER — Encounter (HOSPITAL_BASED_OUTPATIENT_CLINIC_OR_DEPARTMENT_OTHER): Payer: Self-pay | Admitting: Emergency Medicine

## 2021-07-15 ENCOUNTER — Other Ambulatory Visit: Payer: Self-pay

## 2021-07-15 DIAGNOSIS — W268XXA Contact with other sharp object(s), not elsewhere classified, initial encounter: Secondary | ICD-10-CM | POA: Diagnosis not present

## 2021-07-15 DIAGNOSIS — S61210A Laceration without foreign body of right index finger without damage to nail, initial encounter: Secondary | ICD-10-CM | POA: Insufficient documentation

## 2021-07-15 DIAGNOSIS — Z23 Encounter for immunization: Secondary | ICD-10-CM | POA: Diagnosis not present

## 2021-07-15 DIAGNOSIS — S60940A Unspecified superficial injury of right index finger, initial encounter: Secondary | ICD-10-CM | POA: Diagnosis present

## 2021-07-15 MED ORDER — TETANUS-DIPHTH-ACELL PERTUSSIS 5-2.5-18.5 LF-MCG/0.5 IM SUSY
0.5000 mL | PREFILLED_SYRINGE | Freq: Once | INTRAMUSCULAR | Status: AC
  Administered 2021-07-15: 0.5 mL via INTRAMUSCULAR
  Filled 2021-07-15: qty 0.5

## 2021-07-15 MED ORDER — LIDOCAINE HCL (PF) 1 % IJ SOLN
10.0000 mL | Freq: Once | INTRAMUSCULAR | Status: AC
  Administered 2021-07-15: 5 mL
  Filled 2021-07-15: qty 10

## 2021-07-15 NOTE — ED Notes (Signed)
Suture cart to bedside. 

## 2021-07-15 NOTE — ED Triage Notes (Signed)
Pt has lac to RT index finger; cut while moving a microwave just PTA

## 2021-07-15 NOTE — Discharge Instructions (Addendum)
1. Medications: Tylenol or ibuprofen for pain, usual home medications 2. Treatment: ice for swelling, keep wound clean with warm soap and water and keep bandage dry, do not submerge in water for 24 hours 3. Follow Up: Please return or follow up with primary care provider in 7-10 days to have your stitches/staples removed or sooner if you have concerns. Return to the emergency department for increased redness, drainage of pus from the wound, or fevers/chills.   WOUND CARE  Keep area clean and dry for 24 hours. Do not remove bandage, if applied.  After 24 hours, remove bandage and wash wound gently with mild soap and warm water. Reapply a new bandage after cleaning wound, if directed.   Continue daily cleansing with soap and water until stitches/staples are removed.  Do not apply any ointments or creams to the wound while stitches/staples are in place, as this may cause delayed healing. Return if you experience any of the following signs of infection: Swelling, redness, pus drainage, streaking, fever >101.0 F  Return if you experience excessive bleeding that does not stop after 15-20 minutes of constant, firm pressure.

## 2021-07-15 NOTE — ED Provider Notes (Signed)
[facility] EMERGENCY DEPARTMENT Provider Note   CSN: [id number] Arrival date & time: 07/15/21  1203     History Chief Complaint  Patient presents with   Hailey Noble is a 55 y.o. female.  Hailey Noble is a 55 y.o. female hyperlipidemia, GERD, who presents to the ED for evaluation of laceration to the right index finger.  Patient was moving a microwave and if she went to hold it there was a sharp edge of the cut down on the dorsal aspect of her right index finger.  Bleeding controlled.  Able to flex and extend the finger.  No numbness or tingling.  Tetanus updated maybe 5 years ago, not entirely sure.  No other injuries.  The history is provided by the patient and the spouse.      Past Medical History:  Diagnosis Date   Allergic state 12/08/2016   Allergy    Anxiety    Back pain    " i just have a weak loer back"    Fibroids    GERD (gastroesophageal reflux disease)    Hyperlipidemia 06/09/2017   Insulin resistance    Joint pain    Knee pain, right 03/10/2017   Plantar fasciitis    PONV (postoperative nausea and vomiting)    just nausea    Shortness of breath    not only if  i exert myselg    Sinusitis    Sleep apnea    Sleep apnea in adult 12/26/2020   Vitamin D deficiency 11/22/2016   Vitamin D deficiency     Patient Active Problem List   Diagnosis Date Noted   Rash and other nonspecific skin eruption 01/30/2021   Pruritus 01/30/2021   Other rhinitis 01/30/2021   Adverse reaction to food, subsequent encounter 01/30/2021   Sleep apnea in adult 12/26/2020   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 06/01/2020   Left arm pain 06/01/2020   Peroneal tendinitis of left lower leg 02/16/2020   Leukocytosis 12/02/2019   Pain of left heel 12/02/2019   Pelvic pain 10/12/2019   Post-operative state 10/12/2019   Pain, abdominal, RLQ 11/28/2017   Insulin resistance 10/21/2017   Hyperlipidemia 06/09/2017   Knee  pain, right 03/10/2017   Allergic state 12/08/2016   Vitamin D deficiency 11/22/2016   Depression with anxiety 04/06/2014   Obesity (BMI 30-39.9) 04/06/2014   Preventative health care 06/17/2011   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   INGUINAL PAIN, LEFT 12/05/2008    Past Surgical History:  Procedure Laterality Date   ABDOMINAL HYST[place]CTOMY  09/2019   [facility]   COLONOSCOPY  02-2009   with Henrene Pastor normal   CYSTOSCOPY N/A 10/12/2019   Procedure: CYSTOSCOPY;  Surgeon: Sherlyn Hay, DO;  Location: [facility];  Service: Gynecology;  Laterality: N/A;   ESOPHAGOGASTRODUODENOSCOPY  2012   GANGLION CYST EXCISION Right    right- wrist    NASAL SEPTUM SURGERY     NASAL SINUS SURGERY     TMJ ARTHROSCOPY     left   TOTAL LAPAROSCOPIC HYSTERECTOMY WITH BILATERAL SALPINGO OOPHORECTOMY Bilateral 10/12/2019   Procedure: TOTAL LAPAROSCOPIC HYSTERECTOMY WITH BILATERAL SALPINGO OOPHORECTOMY, Lysis Pelvic Adhseions ;  Surgeon: Sherlyn Hay, DO;  Location: [facility];  Service: Gynecology;  Laterality: Bilateral;     OB History     Gravida  0   Para  0   Term  0   Preterm  0   AB  0   Living  0      SAB  0   IAB  0   Ectopic  0   Multiple  0   Live Births  0           Family History  Problem Relation Age of Onset   Hypertension Father    Diabetes Father    Hyperlipidemia Father    Breast cancer Mother 63   Cancer Mother        breast   Thyroid disease Mother    Sleep apnea Mother    Colon cancer Paternal Grandfather        died at age 71   Cancer Paternal Grandfather        GI CA   Obesity Sister    Diabetes Sister    Hypertension Sister    Heart disease Maternal Grandmother        MI   Cancer Maternal Grandfather        lung, smoker   Diabetes Maternal Grandfather    Diabetes Paternal Grandmother    Heart disease Paternal Grandmother    Pancreatitis Sister    Esophageal cancer Neg Hx    Stomach cancer  Neg Hx    Stroke Neg Hx    Colon polyps Neg Hx    Rectal cancer Neg Hx    Pancreatic cancer Neg Hx     Social History   Tobacco Use   Smoking status: Never   Smokeless tobacco: Never  Vaping Use   Vaping Use: Never used  Substance Use Topics   Alcohol use: Yes    Alcohol/week: 0.0 standard drinks    Comment: rare   Drug use: No    Home Medications Prior to Admission medications   Medication Sig Start Date End Date Taking? Authorizing Provider  acetaminophen (TYLENOL) 500 MG tablet Take 500 mg by mouth every 6 (six) hours as needed for headache.    [provider]  buPROPion (WELLBUTRIN) 75 MG tablet TAKE 1 TABLET BY MOUTH ONCE DAILY Patient taking differently: Take 75 mg by mouth daily. 12/26/20 12/26/21  Mosie Lukes, MD  Calcium Carbonate-Vitamin D (CALTRATE 600+D PO) Take 1,200 mg by mouth daily.    [provider]  escitalopram (LEXAPRO) 10 MG tablet TAKE 1 TABLET BY MOUTH ONCE DAILY Patient taking differently: Take 10 mg by mouth at bedtime. 02/07/21 02/07/22  Mosie Lukes, MD  fluticasone (FLONASE) 50 MCG/ACT nasal spray Place 2 sprays into both nostrils daily. Patient taking differently: Place 1 spray into both nostrils daily as needed for allergies. 04/13/21   Mosie Lukes, MD  lidocaine (XYLOCAINE) 2 % solution Use as directed 5 mLs in the mouth or throat every 6 (six) hours as needed for mouth pain. 07/03/21   Marrian Salvage, FNP  pantoprazole (PROTONIX) 40 MG tablet Take 1 tablet (40 mg total) by mouth daily. 04/25/21   Jackquline Denmark, MD  Polyethyl Glycol-Propyl Glycol (SYSTANE) 0.4-0.3 % SOLN Place 1 drop into both eyes daily as needed (Allergies).    [provider]  Semaglutide-Weight Management (WEGOVY) 1.7 MG/0.75ML SOAJ Inject 1.7 mg into the skin once a week. 06/18/21   Abby Potash, PA-C  ursodiol (URSO FORTE) 500 MG tablet Take 1 tablet (500 mg total) by mouth in the morning and at bedtime. 06/22/21   Jackquline Denmark, MD   Vitamin D, Cholecalciferol, 25 MCG (1000 UT) TABS Take 4,000 units daily Patient taking differently: Take 4,000  Units by mouth daily. 03/21/21   Abby Potash, PA-C    Allergies    Coconut fatty acids, Codeine, Mango flavor, [name], Oxycodone-acetaminophen, Tussionex pennkinetic er [hydrocod polst-cpm polst er], and Ultram [tramadol]  Review of Systems   Review of Systems  Constitutional:  Negative for chills and fever.  Musculoskeletal:  Negative for arthralgias.  Skin:  Positive for wound.  Neurological:  Negative for weakness and numbness.   Physical Exam Updated Vital Signs BP 128/62   Pulse 88   Temp 98.4 F (36.9 C) (Oral)   Resp 18   Ht '5\' 5"'$  (1.651 m)   Wt 95.3 kg   LMP 11/22/2018 (Approximate)   SpO2 94%   BMI 34.95 kg/m   Physical Exam Vitals and nursing note reviewed.  Constitutional:      General: She is not in acute distress.    Appearance: Normal appearance. She is well-developed. She is not ill-appearing or diaphoretic.  HENT:     Head: Normocephalic and atraumatic.  Eyes:     General:        Right eye: No discharge.        Left eye: No discharge.  Pulmonary:     Effort: Pulmonary effort is normal. No respiratory distress.  Musculoskeletal:     Comments: 3 cm laceration to the dorsum of the base of the right finger, extensor tendon is fully visible but intact, patient able to flex and extend the finger against resistance with normal strength, normal sensation.  Bleeding controlled.  Laceration is clean without debris.  Skin:    General: Skin is warm and dry.  Neurological:     Mental Status: She is alert and oriented to person, place, and time.     Coordination: Coordination normal.  Psychiatric:        Mood and Affect: Mood normal.        Behavior: Behavior normal.    ED Results / Procedures / Treatments   Labs (all labs ordered are listed, but only abnormal results are displayed) Labs Reviewed - No data to  display  EKG None  Radiology No results found.  Procedures .Marland KitchenLaceration Repair  Date/Time: 07/15/2021 3:09 PM Performed by: Jacqlyn Larsen, PA-C Authorized by: Jacqlyn Larsen, PA-C   Consent:    Consent obtained:  Verbal   Consent given by:  Patient   Risks discussed:  Infection, pain, need for additional repair and poor wound healing   Alternatives discussed:  No treatment Universal protocol:    Procedure explained and questions answered to patient or proxy's satisfaction: yes     Patient identity confirmed:  Verbally with patient Anesthesia:    Anesthesia method:  Local infiltration   Local anesthetic:  Lidocaine 1% w/o epi Laceration details:    Location:  Finger   Finger location:  R index finger   Length (cm):  3 Pre-procedure details:    Preparation:  Patient was prepped and draped in usual sterile fashion and imaging obtained to evaluate for foreign bodies Exploration:    Hemostasis achieved with:  Direct pressure   Wound exploration: wound explored through full range of motion and entire depth of wound visualized     Wound extent: areolar tissue violated     Wound extent: no nerve damage noted and no tendon damage noted   Treatment:    Area cleansed with:  Shur-Clens   Amount of cleaning:  Standard Skin repair:    Repair method:  Sutures   Suture size:  5-0  Suture material:  Nylon   Suture technique:  Simple interrupted   Number of sutures:  4 Approximation:    Approximation:  Close Post-procedure details:    Dressing:  Splint for protection   Procedure completion:  Tolerated well, no immediate complications   Medications Ordered in ED Medications  lidocaine (PF) (XYLOCAINE) 1 % injection 10 mL (5 mLs Infiltration Given 07/15/21 1429)  Tdap (BOOSTRIX) injection 0.5 mL (0.5 mLs Intramuscular Given 07/15/21 1430)    ED Course  I have reviewed the triage vital signs and the nursing notes.  Pertinent labs & imaging results that were available during my  care of the patient were reviewed by me and considered in my medical decision making (see chart for details).    MDM Rules/Calculators/A&P                           55 year old female presents with laceration to the base of the right index finger over the dorsal aspect.  Flexor tendon is fully visible but intact, no concern for underlying fracture or foreign body.  Neurovascularly intact.  Laceration cleaned, anesthetized and closed with simple interrupted sutures with good cosmesis and hemostasis.  Splint applied for protection .  Discussed appropriate wound care, suture removal in 7 to 10 days.  Patient expresses understanding and agreement.  Discharged home in good condition.  Final Clinical Impression(s) / ED Diagnoses Final diagnoses:  Laceration of right index finger without foreign body without damage to nail, initial encounter    Rx / DC Orders ED Discharge Orders     None        Janet Berlin 07/15/21 1524    Hayden Rasmussen, MD 07/16/21 1149

## 2021-07-15 NOTE — ED Notes (Signed)
Finger splint applied to index finger, shaped for normal anatomical position, telfa pad applied of laceration site and held with Kerlix. AVS provided to client. Instructed when to return for suture removal

## 2021-07-17 ENCOUNTER — Other Ambulatory Visit (HOSPITAL_BASED_OUTPATIENT_CLINIC_OR_DEPARTMENT_OTHER): Payer: Self-pay

## 2021-07-23 ENCOUNTER — Other Ambulatory Visit (HOSPITAL_BASED_OUTPATIENT_CLINIC_OR_DEPARTMENT_OTHER): Payer: Self-pay

## 2021-07-24 ENCOUNTER — Other Ambulatory Visit (HOSPITAL_BASED_OUTPATIENT_CLINIC_OR_DEPARTMENT_OTHER): Payer: Self-pay

## 2021-07-25 ENCOUNTER — Other Ambulatory Visit: Payer: Self-pay

## 2021-07-25 ENCOUNTER — Other Ambulatory Visit (INDEPENDENT_AMBULATORY_CARE_PROVIDER_SITE_OTHER): Payer: 59

## 2021-07-25 DIAGNOSIS — R7989 Other specified abnormal findings of blood chemistry: Secondary | ICD-10-CM | POA: Diagnosis not present

## 2021-07-26 ENCOUNTER — Encounter (INDEPENDENT_AMBULATORY_CARE_PROVIDER_SITE_OTHER): Payer: Self-pay

## 2021-07-26 LAB — HEPATIC FUNCTION PANEL
ALT: 17 U/L (ref 0–35)
AST: 14 U/L (ref 0–37)
Albumin: 4.1 g/dL (ref 3.5–5.2)
Alkaline Phosphatase: 125 U/L — ABNORMAL HIGH (ref 39–117)
Bilirubin, Direct: 0 mg/dL (ref 0.0–0.3)
Total Bilirubin: 0.2 mg/dL (ref 0.2–1.2)
Total Protein: 6.6 g/dL (ref 6.0–8.3)

## 2021-07-27 ENCOUNTER — Other Ambulatory Visit (HOSPITAL_BASED_OUTPATIENT_CLINIC_OR_DEPARTMENT_OTHER): Payer: Self-pay

## 2021-07-27 ENCOUNTER — Ambulatory Visit: Payer: 59 | Admitting: Family Medicine

## 2021-07-27 ENCOUNTER — Other Ambulatory Visit: Payer: Self-pay

## 2021-07-27 ENCOUNTER — Encounter: Payer: 59 | Attending: Family Medicine | Admitting: Registered"

## 2021-07-27 ENCOUNTER — Encounter: Payer: Self-pay | Admitting: Family Medicine

## 2021-07-27 VITALS — BP 104/76 | HR 86 | Temp 97.9°F | Ht 65.0 in | Wt 216.0 lb

## 2021-07-27 DIAGNOSIS — R233 Spontaneous ecchymoses: Secondary | ICD-10-CM

## 2021-07-27 DIAGNOSIS — R040 Epistaxis: Secondary | ICD-10-CM | POA: Diagnosis not present

## 2021-07-27 DIAGNOSIS — R7303 Prediabetes: Secondary | ICD-10-CM | POA: Insufficient documentation

## 2021-07-27 LAB — CBC WITH DIFFERENTIAL/PLATELET
Basophils Absolute: 0 10*3/uL (ref 0.0–0.1)
Basophils Relative: 0.7 % (ref 0.0–3.0)
Eosinophils Absolute: 0.2 10*3/uL (ref 0.0–0.7)
Eosinophils Relative: 2.4 % (ref 0.0–5.0)
HCT: 38 % (ref 36.0–46.0)
Hemoglobin: 12.7 g/dL (ref 12.0–15.0)
Lymphocytes Relative: 22.4 % (ref 12.0–46.0)
Lymphs Abs: 1.6 10*3/uL (ref 0.7–4.0)
MCHC: 33.4 g/dL (ref 30.0–36.0)
MCV: 88 fl (ref 78.0–100.0)
Monocytes Absolute: 0.5 10*3/uL (ref 0.1–1.0)
Monocytes Relative: 7.2 % (ref 3.0–12.0)
Neutro Abs: 4.7 10*3/uL (ref 1.4–7.7)
Neutrophils Relative %: 67.3 % (ref 43.0–77.0)
Platelets: 358 10*3/uL (ref 150.0–400.0)
RBC: 4.32 Mil/uL (ref 3.87–5.11)
RDW: 13.2 % (ref 11.5–15.5)
WBC: 7 10*3/uL (ref 4.0–10.5)

## 2021-07-27 MED ORDER — AZELASTINE HCL 0.1 % NA SOLN
1.0000 | Freq: Two times a day (BID) | NASAL | 2 refills | Status: DC
  Filled 2021-07-27: qty 30, 50d supply, fill #0

## 2021-07-27 MED ORDER — PREDNISONE 20 MG PO TABS
40.0000 mg | ORAL_TABLET | Freq: Every day | ORAL | 0 refills | Status: AC
  Filled 2021-07-27: qty 10, 5d supply, fill #0

## 2021-07-27 NOTE — Progress Notes (Signed)
Chief Complaint  Patient presents with   Epistaxis    Hailey Noble is a 55 y.o. female here for a skin complaint.  Duration: 3 weeks Location: legs, abd Pruritic? No Painful? No Drainage? No New soaps/lotions/topicals/detergents? No Sick contacts? No Other associated symptoms: had covid; nosebleeds; no fevers Therapies tried thus far: started asa, has not tried anything for this  Past Medical History:  Diagnosis Date   Allergic state 12/08/2016   Allergy    Anxiety    Back pain    " i just have a weak loer back"    Fibroids    GERD (gastroesophageal reflux disease)    Hyperlipidemia 06/09/2017   Insulin resistance    Joint pain    Knee pain, right 03/10/2017   Plantar fasciitis    PONV (postoperative nausea and vomiting)    just nausea    Shortness of breath    not only if  i exert myselg    Sinusitis    Sleep apnea    Sleep apnea in adult 12/26/2020   Vitamin D deficiency 11/22/2016   Vitamin D deficiency     BP 104/76   Pulse 86   Temp 97.9 F (36.6 C) (Oral)   Ht '5\' 5"'$  (1.651 m)   Wt 216 lb (98 kg)   LMP 11/22/2018 (Approximate)   SpO2 96%   BMI 35.94 kg/m  Gen: awake, alert, appearing stated age Lungs: No accessory muscle use Skin: See below. It does not blanch and lesions are not raised. No drainage, erythema, TTP, fluctuance, excoriation Psych: Age appropriate judgment and insight           Petechial rash - Plan: CBC w/Diff, predniSONE (DELTASONE) 20 MG tablet  Epistaxis  New problem, uncertain prog. Stop ASA. 5 d pred burst 40 mg/d. She has possible autoimmune hep, could be a vasculitis? Ck platelets. Follow up w Derm. TAO bid and vaseline for nose. Humidify air.  F/u prn. The patient voiced understanding and agreement to the plan.  [place], DO 07/27/21 10:08 AM

## 2021-07-27 NOTE — Patient Instructions (Signed)
Give [name] 2-3 business days to get the results of your labs back.   Stop aspirin.  Call your dermatologist as a contingency.   Let us know if you need anything.

## 2021-07-31 ENCOUNTER — Ambulatory Visit (INDEPENDENT_AMBULATORY_CARE_PROVIDER_SITE_OTHER): Payer: 59 | Admitting: Physician Assistant

## 2021-08-02 ENCOUNTER — Encounter: Payer: Self-pay | Admitting: Registered"

## 2021-08-02 ENCOUNTER — Other Ambulatory Visit: Payer: Self-pay

## 2021-08-02 DIAGNOSIS — R7989 Other specified abnormal findings of blood chemistry: Secondary | ICD-10-CM

## 2021-08-02 NOTE — Progress Notes (Signed)
On 07/27/21 patient completed a post core session of the Diabetes Prevention Program course virtually with [facility]. By the end of this session patients are able to complete the following objectives:   Virtual Visit via Video Note  I connected with [facility] by a video enabled application and verified that I am speaking with the correct person using two identifiers.  Location: Patient: Home.  Provider: Office.   Learning Objectives: Counter self-defeating thoughts with positive self-statements Define assertiveness.  List examples of ways to practice assertiveness.   Goals:  Record weight taken outside of class.  Track foods and beverages eaten each day in the "Food and Activity Tracker," including calories and fat grams for each item.   Track activity type, minutes you were active, and distance you reached each day in the "Food and Activity Tracker."   Follow-Up Plan: Attend next session.  Email completed "Food and Activity Trackers" before next session to be reviewed by Lifestyle Coach.

## 2021-08-02 NOTE — Progress Notes (Signed)
Patient should get lab work done around 11-02-2021

## 2021-08-03 ENCOUNTER — Other Ambulatory Visit (HOSPITAL_BASED_OUTPATIENT_CLINIC_OR_DEPARTMENT_OTHER): Payer: Self-pay

## 2021-08-03 ENCOUNTER — Other Ambulatory Visit: Payer: Self-pay

## 2021-08-03 ENCOUNTER — Ambulatory Visit (INDEPENDENT_AMBULATORY_CARE_PROVIDER_SITE_OTHER): Payer: 59 | Admitting: Family Medicine

## 2021-08-03 VITALS — BP 120/76 | HR 88 | Temp 98.4°F | Resp 16 | Ht 65.0 in | Wt 220.6 lb

## 2021-08-03 DIAGNOSIS — E7849 Other hyperlipidemia: Secondary | ICD-10-CM | POA: Diagnosis not present

## 2021-08-03 DIAGNOSIS — Z0000 Encounter for general adult medical examination without abnormal findings: Secondary | ICD-10-CM

## 2021-08-03 DIAGNOSIS — E559 Vitamin D deficiency, unspecified: Secondary | ICD-10-CM

## 2021-08-03 DIAGNOSIS — E8881 Metabolic syndrome: Secondary | ICD-10-CM | POA: Diagnosis not present

## 2021-08-03 DIAGNOSIS — E669 Obesity, unspecified: Secondary | ICD-10-CM | POA: Diagnosis not present

## 2021-08-03 DIAGNOSIS — R739 Hyperglycemia, unspecified: Secondary | ICD-10-CM | POA: Diagnosis not present

## 2021-08-03 DIAGNOSIS — F418 Other specified anxiety disorders: Secondary | ICD-10-CM | POA: Diagnosis not present

## 2021-08-03 MED ORDER — METFORMIN HCL ER 500 MG PO TB24
500.0000 mg | ORAL_TABLET | Freq: Every day | ORAL | 1 refills | Status: DC
  Filled 2021-08-03: qty 90, 90d supply, fill #0
  Filled 2021-11-07: qty 90, 90d supply, fill #1

## 2021-08-03 NOTE — Patient Instructions (Signed)
Preventive Care 56-56 Years Old, Female Preventive care refers to lifestyle choices and visits with your health care provider that can promote health and wellness. This includes: A yearly physical exam. This is also called an annual wellness visit. Regular dental and eye exams. Immunizations. Screening for certain conditions. Healthy lifestyle choices, such as: Eating a healthy diet. Getting regular exercise. Not using drugs or products that contain nicotine and tobacco. Limiting alcohol use. What can I expect for my preventive care visit? Physical exam Your health care provider will check your: Height and weight. These may be used to calculate your BMI (body mass index). BMI is a measurement that tells if you are at a healthy weight. Heart rate and blood pressure. Body temperature. Skin for abnormal spots. Counseling Your health care provider may ask you questions about your: Past medical problems. Family's medical history. Alcohol, tobacco, and drug use. Emotional well-being. Home life and relationship well-being. Sexual activity. Diet, exercise, and sleep habits. Work and work Statistician. Access to firearms. Method of birth control. Menstrual cycle. Pregnancy history. What immunizations do I need?  Vaccines are usually given at various ages, according to a schedule. Your health care provider will recommend vaccines for you based on your age, medicalhistory, and lifestyle or other factors, such as travel or where you work. What tests do I need? Blood tests Lipid and cholesterol levels. These may be checked every 5 years, or more often if you are over 56 years old. Hepatitis C test. Hepatitis B test. Screening Lung cancer screening. You may have this screening every year starting at age 30 if you have a 30-pack-year history of smoking and currently smoke or have quit within the past 15 years. Colorectal cancer screening. All adults should have this screening starting at  age 56 and continuing until age 56. Your health care provider may recommend screening at age 56 if you are at increased risk. You will have tests every 1-10 years, depending on your results and the type of screening test. Diabetes screening. This is done by checking your blood sugar (glucose) after you have not eaten for a while (fasting). You may have this done every 1-3 years. Mammogram. This may be done every 1-2 years. Talk with your health care provider about when you should start having regular mammograms. This may depend on whether you have a family history of breast cancer. BRCA-related cancer screening. This may be done if you have a family history of breast, ovarian, tubal, or peritoneal cancers. Pelvic exam and Pap test. This may be done every 3 years starting at age 56. Starting at age 54, this may be done every 5 years if you have a Pap test in combination with an HPV test. Other tests STD (sexually transmitted disease) testing, if you are at risk. Bone density scan. This is done to screen for osteoporosis. You may have this scan if you are at high risk for osteoporosis. Talk with your health care provider about your test results, treatment options,and if necessary, the need for more tests. Follow these instructions at home: Eating and drinking  Eat a diet that includes fresh fruits and vegetables, whole grains, lean protein, and low-fat dairy products. Take vitamin and mineral supplements as recommended by your health care provider. Do not drink alcohol if: Your health care provider tells you not to drink. You are pregnant, may be pregnant, or are planning to become pregnant. If you drink alcohol: Limit how much you have to 0-1 drink a day. Be aware  of how much alcohol is in your drink. In the [place] one drink equals one 12 oz bottle of beer (355 mL), one 5 oz glass of wine (148 mL), or one 1 oz glass of hard liquor (44 mL).  Lifestyle Take daily care of your teeth and  gums. Brush your teeth every morning and night with fluoride toothpaste. Floss one time each day. Stay active. Exercise for at least 30 minutes 5 or more days each week. Do not use any products that contain nicotine or tobacco, such as cigarettes, e-cigarettes, and chewing tobacco. If you need help quitting, ask your health care provider. Do not use drugs. If you are sexually active, practice safe sex. Use a condom or other form of protection to prevent STIs (sexually transmitted infections). If you do not wish to become pregnant, use a form of birth control. If you plan to become pregnant, see your health care provider for a prepregnancy visit. If told by your health care provider, take low-dose aspirin daily starting at age 56. Find healthy ways to cope with stress, such as: Meditation, yoga, or listening to music. Journaling. Talking to a trusted person. Spending time with friends and family. Safety Always wear your seat belt while driving or riding in a vehicle. Do not drive: If you have been drinking alcohol. Do not ride with someone who has been drinking. When you are tired or distracted. While texting. Wear a helmet and other protective equipment during sports activities. If you have firearms in your house, make sure you follow all gun safety procedures. What's next? Visit your health care provider once a year for an annual wellness visit. Ask your health care provider how often you should have your eyes and teeth checked. Stay up to date on all vaccines. This information is not intended to replace advice given to you by your health care provider. Make sure you discuss any questions you have with your healthcare provider. Document Revised: 09/12/2020 Document Reviewed: 08/20/2018 Elsevier Patient Education  2022 [employer].

## 2021-08-05 NOTE — Assessment & Plan Note (Signed)
Encourage heart healthy diet such as MIND or DASH diet, increase exercise, avoid trans fats, simple carbohydrates and processed foods, consider a krill or fish or flaxseed oil cap daily.  °

## 2021-08-05 NOTE — Assessment & Plan Note (Signed)
Supplement and monitor 

## 2021-08-05 NOTE — Assessment & Plan Note (Signed)
hgba1c acceptable, minimize simple carbs. Increase exercise as tolerated.  

## 2021-08-05 NOTE — Assessment & Plan Note (Signed)
Doing well on current meds no changes 

## 2021-08-05 NOTE — Assessment & Plan Note (Signed)
Encouraged DASH or MIND diet, decrease po intake and increase exercise as tolerated. Needs 7-8 hours of sleep nightly. Avoid trans fats, eat small, frequent meals every 4-5 hours with lean proteins, complex carbs and healthy fats. Minimize simple carbs, high fat foods and processed foods 

## 2021-08-05 NOTE — Assessment & Plan Note (Signed)
Patient encouraged to maintain heart healthy diet, regular exercise, adequate sleep. Consider daily probiotics. Take medications as prescribed. Labs ordered and reviewed. Sees GYN for paps, mgm was 12/21 repeat in 6-12 months. Colonoscopy was 05/2021 repeat in 5 years

## 2021-08-05 NOTE — Assessment & Plan Note (Signed)
Metformin 500 mg po daily restarted

## 2021-08-05 NOTE — Progress Notes (Signed)
Subjective:    Patient ID: Hailey Noble, female    DOB: 1966/10/25, 55 y.o.   MRN: [id number]  No chief complaint on file.   HPI Patient is in today for annual preventative exam and follow up on chronic medical concerns. No recent febrile illness or hospitalizations. She has been working long hours and is tired but is managing with current doses of Wellbutrin and Lexapro. No c/o polyuria or polydipsia. She is trying to eat well. Does not exercise routinely. No acute concerns. In May she had some rectal bleeding so she underwent colonoscopy with Dr Lyndel Safe and she has had no further recurrence. Denies CP/palp/SOB/HA/congestion/fevers or GU c/o. Taking meds as prescribed   Past Medical History:  Diagnosis Date   Allergic state 12/08/2016   Allergy    Anxiety    Back pain    " i just have a weak loer back"    Fibroids    GERD (gastroesophageal reflux disease)    Hyperlipidemia 06/09/2017   Insulin resistance    Joint pain    Knee pain, right 03/10/2017   Plantar fasciitis    PONV (postoperative nausea and vomiting)    just nausea    Shortness of breath    not only if  i exert myselg    Sinusitis    Sleep apnea    Sleep apnea in adult 12/26/2020   Vitamin D deficiency 11/22/2016   Vitamin D deficiency     Past Surgical History:  Procedure Laterality Date   ABDOMINAL HYSTERECTOMY  09/2019   [place]   COLONOSCOPY  02-2009   with Henrene Pastor normal   CYSTOSCOPY N/A 10/12/2019   Procedure: CYSTOSCOPY;  Surgeon: Sherlyn Hay, DO;  Location: [facility];  Service: Gynecology;  Laterality: N/A;   ESOPHAGOGASTRODUODENOSCOPY  2012   GANGLION CYST EXCISION Right    right- wrist    NASAL SEPTUM SURGERY     NASAL SINUS SURGERY     TMJ ARTHROSCOPY     left   TOTAL LAPAROSCOPIC HYSTERECTOMY WITH BILATERAL SALPINGO OOPHORECTOMY Bilateral 10/12/2019   Procedure: TOTAL LAPAROSCOPIC HYSTERECTOMY WITH BILATERAL SALPINGO OOPHORECTOMY, Lysis Pelvic Adhseions ;  Surgeon:  Sherlyn Hay, DO;  Location: [facility];  Service: Gynecology;  Laterality: Bilateral;    Family History  Problem Relation Age of Onset   Hypertension Father    Diabetes Father    Hyperlipidemia Father    Breast cancer Mother 45   Cancer Mother        breast   Thyroid disease Mother    Sleep apnea Mother    Colon cancer Paternal Grandfather        died at age 28   Cancer Paternal Grandfather        GI CA   Obesity Sister    Diabetes Sister    Hypertension Sister    Heart disease Maternal Grandmother        MI   Cancer Maternal Grandfather        lung, smoker   Diabetes Maternal Grandfather    Diabetes Paternal Grandmother    Heart disease Paternal Grandmother    Pancreatitis Sister    Esophageal cancer Neg Hx    Stomach cancer Neg Hx    Stroke Neg Hx    Colon polyps Neg Hx    Rectal cancer Neg Hx    Pancreatic cancer Neg Hx     Social History   Socioeconomic History   Marital status: Single  Spouse name: Not on file   Number of children: Not on file   Years of education: Not on file   Highest education level: Not on file  Occupational History   Occupation: Therapist, sports    Employer: [employer]  Tobacco Use   Smoking status: Never   Smokeless tobacco: Never  Vaping Use   Vaping Use: Never used  Substance and Sexual Activity   Alcohol use: Yes    Alcohol/week: 0.0 standard drinks    Comment: rare   Drug use: No   Sexual activity: Yes    Birth control/protection: I.U.D.  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 Works at [employer]. No dietary restrictions. Lives with dog   Social Determinants of Health   Financial Resource Strain: Not on file  Food Insecurity: Not on file  Transportation Needs: Not on file  Physical Activity: Not on file  Stress: Not on file  Social Connections: Not on file  Intimate Partner Violence: Not on file     Outpatient Medications Prior to Visit  Medication Sig Dispense Refill   acetaminophen (TYLENOL) 500 MG tablet Take 500 mg by mouth every 6 (six) hours as needed for headache.     buPROPion (WELLBUTRIN) 75 MG tablet TAKE 1 TABLET BY MOUTH ONCE DAILY (Patient taking differently: Take 75 mg by mouth daily.) 90 tablet 1   Calcium Carbonate-Vitamin D (CALTRATE 600+D PO) Take 1,200 mg by mouth daily.     escitalopram (LEXAPRO) 10 MG tablet TAKE 1 TABLET BY MOUTH ONCE DAILY (Patient taking differently: Take 10 mg by mouth at bedtime.) 90 tablet 3   fluticasone (FLONASE) 50 MCG/ACT nasal spray Place 2 sprays into both nostrils daily. (Patient taking differently: Place 1 spray into both nostrils daily as needed for allergies.) 16 g 5   pantoprazole (PROTONIX) 40 MG tablet Take 1 tablet (40 mg total) by mouth daily. 90 tablet 3   Polyethyl Glycol-Propyl Glycol (SYSTANE) 0.4-0.3 % SOLN Place 1 drop into both eyes daily as needed (Allergies).     Polyethylene Glycol 3350 (MIRALAX PO) Take by mouth.     ursodiol (URSO FORTE) 500 MG tablet Take 1 tablet (500 mg total) by mouth in the morning and at bedtime. 60 tablet 11   Vitamin D, Cholecalciferol, 25 MCG (1000 UT) TABS Take 4,000 units daily (Patient not taking: Reported on 08/03/2021) 60 tablet    azelastine (ASTELIN) 0.1 % nasal spray Place 1 spray into both nostrils 2 (two) times daily. 30 mL 2   No facility-administered medications prior to visit.    Allergies  Allergen Reactions   Coconut Fatty Acids Hives   Codeine Hives and Itching   Mango Flavor Hives   [name] Itching and Swelling   Oxycodone-Acetaminophen     rash on face and head, itching   Tussionex Pennkinetic Er [Hydrocod Polst-Cpm Polst Er]     rash on face and head, itching   Ultram [Tramadol] Other (See Comments)    Dizzy and low blood pressure.     Review of Systems  Constitutional:  Positive for malaise/fatigue. Negative for chills and fever.  HENT:  Negative for  congestion and hearing loss.   Eyes:  Negative for discharge.  Respiratory:  Negative for cough, sputum production and shortness of breath.   Cardiovascular:  Negative for chest pain, palpitations and leg swelling.  Gastrointestinal:  Negative for abdominal pain, blood in  stool, constipation, diarrhea, heartburn, nausea and vomiting.  Genitourinary:  Negative for dysuria, frequency, hematuria and urgency.  Musculoskeletal:  Negative for back pain, falls and myalgias.  Skin:  Negative for rash.  Neurological:  Negative for dizziness, sensory change, loss of consciousness, weakness and headaches.  Endo/Heme/Allergies:  Negative for environmental allergies. Does not bruise/bleed easily.  Psychiatric/Behavioral:  Negative for depression and suicidal ideas. The patient is not nervous/anxious and does not have insomnia.       Objective:    Physical Exam Constitutional:      General: She is not in acute distress.    Appearance: She is well-developed.  HENT:     Head: Normocephalic and atraumatic.     Right Ear: Tympanic membrane, ear canal and external ear normal. There is no impacted cerumen.     Left Ear: Tympanic membrane, ear canal and external ear normal. There is no impacted cerumen.  Eyes:     General:        Right eye: No discharge.        Left eye: No discharge.     Extraocular Movements: Extraocular movements intact.     Conjunctiva/sclera: Conjunctivae normal.     Pupils: Pupils are equal, round, and reactive to light.  Neck:     Thyroid: No thyromegaly.  Cardiovascular:     Rate and Rhythm: Normal rate and regular rhythm.     Heart sounds: Normal heart sounds. No murmur heard. Pulmonary:     Effort: Pulmonary effort is normal. No respiratory distress.     Breath sounds: Normal breath sounds.  Abdominal:     General: Bowel sounds are normal. There is no distension.     Palpations: Abdomen is soft. There is no mass.     Tenderness: There is no abdominal tenderness.   Musculoskeletal:     Cervical back: Neck supple.  Lymphadenopathy:     Cervical: No cervical adenopathy.  Skin:    General: Skin is warm and dry.  Neurological:     Mental Status: She is alert and oriented to person, place, and time.  Psychiatric:        Behavior: Behavior normal.    BP 120/76   Pulse 88   Temp 98.4 F (36.9 C)   Resp 16   Ht '5\' 5"'$  (1.651 m)   Wt 220 lb 9.6 oz (100.1 kg)   LMP 11/22/2018 (Approximate)   SpO2 95%   BMI 36.71 kg/m  Wt Readings from Last 3 Encounters:  08/03/21 220 lb 9.6 oz (100.1 kg)  07/27/21 216 lb (98 kg)  07/15/21 210 lb (95.3 kg)    Diabetic Foot Exam - Simple   No data filed    Lab Results  Component Value Date   WBC 7.0 07/27/2021   HGB 12.7 07/27/2021   HCT 38.0 07/27/2021   PLT 358.0 07/27/2021   GLUCOSE 87 04/25/2021   CHOL 191 05/14/2021   TRIG 155 (H) 05/14/2021   HDL 48 05/14/2021   LDLCALC 116 (H) 05/14/2021   ALT 17 07/25/2021   AST 14 07/25/2021   NA 139 04/25/2021   K 4.2 04/25/2021   CL 102 04/25/2021   CREATININE 0.56 04/25/2021   BUN 16 04/25/2021   CO2 29 04/25/2021   TSH 1.670 01/24/2021   INR 1.0 06/07/2021   HGBA1C 5.5 04/25/2021    Lab Results  Component Value Date   TSH 1.670 01/24/2021   Lab Results  Component Value Date   WBC 7.0 07/27/2021  HGB 12.7 07/27/2021   HCT 38.0 07/27/2021   MCV 88.0 07/27/2021   PLT 358.0 07/27/2021   Lab Results  Component Value Date   NA 139 04/25/2021   K 4.2 04/25/2021   CO2 29 04/25/2021   GLUCOSE 87 04/25/2021   BUN 16 04/25/2021   CREATININE 0.56 04/25/2021   BILITOT 0.2 07/25/2021   ALKPHOS 125 (H) 07/25/2021   AST 14 07/25/2021   ALT 17 07/25/2021   PROT 6.6 07/25/2021   ALBUMIN 4.1 07/25/2021   CALCIUM 9.3 04/25/2021   ANIONGAP 8 10/08/2019   GFR 102.34 04/25/2021   Lab Results  Component Value Date   CHOL 191 05/14/2021   Lab Results  Component Value Date   HDL 48 05/14/2021   Lab Results  Component Value Date   LDLCALC  116 (H) 05/14/2021   Lab Results  Component Value Date   TRIG 155 (H) 05/14/2021   Lab Results  Component Value Date   CHOLHDL 4.0 05/14/2021   Lab Results  Component Value Date   HGBA1C 5.5 04/25/2021       Assessment & Plan:   Problem List Items Addressed This Visit     Preventative health care - Primary    Patient encouraged to maintain heart healthy diet, regular exercise, adequate sleep. Consider daily probiotics. Take medications as prescribed. Labs ordered and reviewed. Sees GYN for paps, mgm was 12/21 repeat in 6-12 months. Colonoscopy was 05/2021 repeat in 5 years      Relevant Orders   CBC with Differential/Platelet   TSH   Depression with anxiety    Doing well on current meds no changes      Obesity (BMI 30-39.9)    Encouraged DASH or MIND diet, decrease po intake and increase exercise as tolerated. Needs 7-8 hours of sleep nightly. Avoid trans fats, eat small, frequent meals every 4-5 hours with lean proteins, complex carbs and healthy fats. Minimize simple carbs, high fat foods and processed foods      Relevant Medications   metFORMIN (GLUCOPHAGE XR) 500 MG 24 hr tablet   Vitamin D deficiency    Supplement and monitor      Relevant Orders   Vitamin D 1,25 dihydroxy   Hyperlipidemia    Encourage heart healthy diet such as MIND or DASH diet, increase exercise, avoid trans fats, simple carbohydrates and processed foods, consider a krill or fish or flaxseed oil cap daily.       Relevant Orders   Lipid panel   Insulin resistance    Metformin 500 mg po daily restarted      Relevant Orders   Hemoglobin A1c   Comprehensive metabolic panel   Hyperglycemia    hgba1c acceptable, minimize simple carbs. Increase exercise as tolerated.        I have discontinued Hailey Noble's azelastine. I am also having her start on metFORMIN. Additionally, I am having her maintain her Calcium Carbonate-Vitamin D (CALTRATE 600+D PO), Vitamin D (Cholecalciferol),  escitalopram, buPROPion, fluticasone, pantoprazole, Systane, acetaminophen, ursodiol, and Polyethylene Glycol 3350 (MIRALAX PO).  Meds ordered this encounter  Medications   metFORMIN (GLUCOPHAGE XR) 500 MG 24 hr tablet    Sig: Take 1 tablet (500 mg total) by mouth daily with breakfast.    Dispense:  90 tablet    Refill:  1     Penni Homans, MD

## 2021-08-09 ENCOUNTER — Encounter: Payer: 59 | Admitting: Registered"

## 2021-08-09 DIAGNOSIS — R7303 Prediabetes: Secondary | ICD-10-CM

## 2021-08-10 ENCOUNTER — Encounter: Payer: 59 | Admitting: Registered"

## 2021-08-15 ENCOUNTER — Encounter: Payer: Self-pay | Admitting: Registered"

## 2021-08-15 DIAGNOSIS — G4733 Obstructive sleep apnea (adult) (pediatric): Secondary | ICD-10-CM | POA: Diagnosis not present

## 2021-08-15 NOTE — Progress Notes (Signed)
On 08/09/21 pt completed a post core session of the Diabetes Prevention Program course virtually with [facility]. By the end of this session patients are able to complete the following objectives:   Virtual Visit via Video Note  I connected with [facility] by a video enabled application and verified that I am speaking with the correct person using two identifiers.  Location: Patient: Home.  Provider: Office.   Learning Objectives: Reflect on lifestyle changes they have made since starting the DPP.  Set long-term goals to promote continued maintenance of lifestyle changes made during the program.   Goals:  Work toward reaching new long-term goals set during class.   Follow-Up Plan: Contact Lifestyle Coach with questions/concerns PRN.

## 2021-08-20 ENCOUNTER — Other Ambulatory Visit (HOSPITAL_BASED_OUTPATIENT_CLINIC_OR_DEPARTMENT_OTHER): Payer: Self-pay

## 2021-09-01 ENCOUNTER — Other Ambulatory Visit: Payer: Self-pay | Admitting: Family Medicine

## 2021-09-03 ENCOUNTER — Other Ambulatory Visit (HOSPITAL_BASED_OUTPATIENT_CLINIC_OR_DEPARTMENT_OTHER): Payer: Self-pay

## 2021-09-03 MED ORDER — BUPROPION HCL 75 MG PO TABS
ORAL_TABLET | Freq: Every day | ORAL | 1 refills | Status: DC
  Filled 2021-09-03: qty 90, 90d supply, fill #0
  Filled 2021-12-27: qty 90, 90d supply, fill #1

## 2021-09-03 MED FILL — Escitalopram Oxalate Tab 10 MG (Base Equiv): ORAL | 90 days supply | Qty: 90 | Fill #1 | Status: AC

## 2021-09-21 ENCOUNTER — Other Ambulatory Visit (HOSPITAL_BASED_OUTPATIENT_CLINIC_OR_DEPARTMENT_OTHER): Payer: Self-pay

## 2021-09-24 ENCOUNTER — Other Ambulatory Visit (HOSPITAL_BASED_OUTPATIENT_CLINIC_OR_DEPARTMENT_OTHER): Payer: Self-pay

## 2021-10-15 ENCOUNTER — Other Ambulatory Visit (HOSPITAL_BASED_OUTPATIENT_CLINIC_OR_DEPARTMENT_OTHER): Payer: Self-pay

## 2021-10-30 ENCOUNTER — Other Ambulatory Visit (HOSPITAL_BASED_OUTPATIENT_CLINIC_OR_DEPARTMENT_OTHER): Payer: Self-pay

## 2021-10-31 ENCOUNTER — Other Ambulatory Visit (HOSPITAL_BASED_OUTPATIENT_CLINIC_OR_DEPARTMENT_OTHER): Payer: Self-pay

## 2021-11-07 ENCOUNTER — Other Ambulatory Visit (HOSPITAL_BASED_OUTPATIENT_CLINIC_OR_DEPARTMENT_OTHER): Payer: Self-pay

## 2021-11-08 ENCOUNTER — Other Ambulatory Visit (HOSPITAL_BASED_OUTPATIENT_CLINIC_OR_DEPARTMENT_OTHER): Payer: Self-pay

## 2021-11-13 ENCOUNTER — Ambulatory Visit: Payer: 59 | Admitting: Family Medicine

## 2021-11-20 ENCOUNTER — Encounter: Payer: Self-pay | Admitting: Adult Health

## 2021-11-20 ENCOUNTER — Ambulatory Visit: Payer: 59 | Admitting: Adult Health

## 2021-11-20 VITALS — BP 92/60 | HR 69 | Ht 65.0 in | Wt 233.2 lb

## 2021-11-20 DIAGNOSIS — Z9989 Dependence on other enabling machines and devices: Secondary | ICD-10-CM | POA: Diagnosis not present

## 2021-11-20 DIAGNOSIS — G4733 Obstructive sleep apnea (adult) (pediatric): Secondary | ICD-10-CM

## 2021-11-20 NOTE — Progress Notes (Signed)
PATIENT: Hailey Noble DOB: Aug 24, 1966  REASON FOR VISIT: follow up HISTORY FROM: patient  HISTORY OF PRESENT ILLNESS: Today 11/20/21:  Hailey Noble is a 55 year old female with a history of obstructive sleep apnea on CPAP.  She returns today for follow-up.  She reports that she did have a mask refitting in July and now has a F 20 fullface mask that works well for her.  She states however the DME company keeps sending her the wrong mask.  Otherwise the CPAP works well for her.  She states that she had COVID in July.  She states since then she has had ongoing issues with congestion.  She has tried multiple over-the-counter medications with minimal benefit.  She is considering getting her PCP to refer her to ENT.  05/15/21: Hailey Noble is a 55 year old female with a history of obstructive sleep apnea on CPAP.  She returns today for follow-up.  She reports that she is having trouble with the mask leaking.  She states that often wakes her up during the night.  She states that if she sleeps on her side the mask will leak.  When she first started CPAP she did notice the benefit.    HISTORY 11/13/2020: I reviewed her CPAP compliance data from 10/02/2020 through 10/31/2020, which is a total of 30 days, during which time she used her machine 26 days with percent use days greater than 4 hours at 70%, indicating adequate compliance with an average usage of 5 hours and 18 minutes, residual AHI at goal at 1.6/h, leak consistently on the higher side with the 95th percentile at 60.8 L/min on a pressure of 14 cm with EPR of 3.  She reports doing well, she has initial adjustment challenges to her CPAP but feels well adapted to treatment and has benefited from it.  She reports better sleep consolidation, better sleep quality and less daytime somnolence.  She does notice a leak from the mask, she tries to sleep sideways but she sleeps with her arm extended above her head on the right side.  She would wake up at times and  the mask is leaking from the mouth and mouth is slightly open, she does have some dryness in her mouth from the CPAP, she also uses a humidifier in the room as well as distilled water in the humidifier chamber of the machine.  She uses a DreamWear full facemask.  She was started on Saxenda about a month ago.  She is still adjusting the dosing with her weight loss specialist.  Her right nostril is often stopped up and she does have some nosebleeds from the right side which is not severe.   REVIEW OF SYSTEMS: Out of a complete 14 system review of symptoms, the patient complains only of the following symptoms, and all other reviewed systems are negative.  FSS 46 ESS 15  ALLERGIES: Allergies  Allergen Reactions   Coconut Fatty Acids Hives   Codeine Hives and Itching   Mango Flavor Hives   [name] Itching and Swelling   Oxycodone-Acetaminophen     rash on face and head, itching   Tussionex Pennkinetic Er [Hydrocod Polst-Cpm Polst Er]     rash on face and head, itching   Ultram [Tramadol] Other (See Comments)    Dizzy and low blood pressure.     HOME MEDICATIONS: Outpatient Medications Prior to Visit  Medication Sig Dispense Refill   acetaminophen (TYLENOL) 500 MG tablet Take 500 mg by mouth every 6 (six) hours as  needed for headache.     buPROPion (WELLBUTRIN) 75 MG tablet TAKE 1 TABLET BY MOUTH ONCE DAILY 90 tablet 1   Calcium Carbonate-Vitamin D (CALTRATE 600+D PO) Take 1,200 mg by mouth daily.     escitalopram (LEXAPRO) 10 MG tablet TAKE 1 TABLET BY MOUTH ONCE DAILY (Patient taking differently: Take 10 mg by mouth at bedtime.) 90 tablet 3   fluticasone (FLONASE) 50 MCG/ACT nasal spray Place 2 sprays into both nostrils daily. (Patient taking differently: Place 1 spray into both nostrils daily as needed for allergies.) 16 g 5   metFORMIN (GLUCOPHAGE XR) 500 MG 24 hr tablet Take 1 tablet (500 mg total) by mouth daily with breakfast. 90 tablet 1   pantoprazole (PROTONIX) 40 MG tablet Take  1 tablet (40 mg total) by mouth daily. 90 tablet 3   Polyethyl Glycol-Propyl Glycol (SYSTANE) 0.4-0.3 % SOLN Place 1 drop into both eyes daily as needed (Allergies).     Polyethylene Glycol 3350 (MIRALAX PO) Take by mouth.     ursodiol (URSO FORTE) 500 MG tablet Take 1 tablet (500 mg total) by mouth in the morning and at bedtime. 60 tablet 11   Vitamin D, Cholecalciferol, 25 MCG (1000 UT) TABS Take 4,000 units daily 60 tablet    No facility-administered medications prior to visit.    PAST MEDICAL HISTORY: Past Medical History:  Diagnosis Date   Allergic state 12/08/2016   Allergy    Anxiety    Back pain    " i just have a weak loer back"    Fibroids    GERD (gastroesophageal reflux disease)    Hyperlipidemia 06/09/2017   Insulin resistance    Joint pain    Knee pain, right 03/10/2017   Plantar fasciitis    PONV (postoperative nausea and vomiting)    just nausea    Shortness of breath    not only if  i exert myselg    Sinusitis    Sleep apnea    Sleep apnea in adult 12/26/2020   Vitamin D deficiency 11/22/2016   Vitamin D deficiency     PAST SURGICAL HISTORY: Past Surgical History:  Procedure Laterality Date   ABDOMINAL HYSTERECTOMY  09/2019   [place]   COLONOSCOPY  02-2009   with Henrene Pastor normal   CYSTOSCOPY N/A 10/12/2019   Procedure: CYSTOSCOPY;  Surgeon: Sherlyn Hay, DO;  Location: [place];  Service: Gynecology;  Laterality: N/A;   ESOPHAGOGASTRODUODENOSCOPY  2012   GANGLION CYST EXCISION Right    right- wrist    NASAL SEPTUM SURGERY     NASAL SINUS SURGERY     TMJ ARTHROSCOPY     left   TOTAL LAPAROSCOPIC HYSTERECTOMY WITH BILATERAL SALPINGO OOPHORECTOMY Bilateral 10/12/2019   Procedure: TOTAL LAPAROSCOPIC HYSTERECTOMY WITH BILATERAL SALPINGO OOPHORECTOMY, Lysis Pelvic Adhseions ;  Surgeon: Sherlyn Hay, DO;  Location: [place];  Service: Gynecology;  Laterality: Bilateral;    FAMILY HISTORY: Family  History  Problem Relation Age of Onset   Hypertension Father    Diabetes Father    Hyperlipidemia Father    Breast cancer Mother 20   Cancer Mother        breast   Thyroid disease Mother    Sleep apnea Mother    Colon cancer Paternal Grandfather        died at age 66   Cancer Paternal Grandfather        GI CA   Obesity Sister    Diabetes Sister  Hypertension Sister    Heart disease Maternal Grandmother        MI   Cancer Maternal Grandfather        lung, smoker   Diabetes Maternal Grandfather    Diabetes Paternal Grandmother    Heart disease Paternal Grandmother    Pancreatitis Sister    Esophageal cancer Neg Hx    Stomach cancer Neg Hx    Stroke Neg Hx    Colon polyps Neg Hx    Rectal cancer Neg Hx    Pancreatic cancer Neg Hx     SOCIAL HISTORY: Social History   Socioeconomic History   Marital status: Single    Spouse name: Not on file   Number of children: Not on file   Years of education: Not on file   Highest education level: Not on file  Occupational History   Occupation: Therapist, sports    Employer: [employer]  Tobacco Use   Smoking status: Never   Smokeless tobacco: Never  Vaping Use   Vaping Use: Never used  Substance and Sexual Activity   Alcohol use: Yes    Alcohol/week: 0.0 standard drinks    Comment: rare   Drug use: No   Sexual activity: Yes    Birth control/protection: I.U.D.  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 Works at [employer]. No dietary restrictions. Lives with dog   Social Determinants of Health   Financial Resource Strain: Not on file  Food Insecurity: Not on file  Transportation Needs: Not on file  Physical Activity: Not on file  Stress: Not on file  Social Connections: Not on file  Intimate Partner Violence: Not on file      PHYSICAL EXAM  Vitals:   11/20/21 0752  BP: 92/60  Pulse: 69  Weight: 233 lb 3.2 oz (105.8 kg)   Height: 5\' 5"  (1.651 m)   Body mass index is 38.81 kg/m.  Generalized: Well developed, in no acute distress  Chest: Lungs clear to auscultation bilaterally  Neurological examination  Mentation: Alert oriented to time, place, history taking. Follows all commands speech and language fluent Cranial nerve II-XII: Extraocular movements were full, visual field were full on confrontational test Head turning and shoulder shrug  were normal and symmetric. Motor: The motor testing reveals 5 over 5 strength of all 4 extremities. Good symmetric motor tone is noted throughout.  Sensory: Sensory testing is intact to soft touch on all 4 extremities. No evidence of extinction is noted.  Gait and station: Gait is normal.    DIAGNOSTIC DATA (LABS, IMAGING, TESTING) - I reviewed patient records, labs, notes, testing and imaging myself where available.  Lab Results  Component Value Date   WBC 7.0 07/27/2021   HGB 12.7 07/27/2021   HCT 38.0 07/27/2021   MCV 88.0 07/27/2021   PLT 358.0 07/27/2021      Component Value Date/Time   NA 139 04/25/2021 1053   NA 143 01/24/2021 0843   K 4.2 04/25/2021 1053   CL 102 04/25/2021 1053   CO2 29 04/25/2021 1053   GLUCOSE 87 04/25/2021 1053   GLUCOSE 86 11/13/2009 0000   BUN 16 04/25/2021 1053   BUN 16 01/24/2021 0843   CREATININE 0.56 04/25/2021 1053   CREATININE 0.67 11/22/2016 1521   CALCIUM 9.3 04/25/2021 1053   PROT 6.6 07/25/2021 1558   PROT 6.8 01/24/2021 0843   ALBUMIN 4.1 07/25/2021  1558   ALBUMIN 4.3 01/24/2021 0843   AST 14 07/25/2021 1558   ALT 17 07/25/2021 1558   ALKPHOS 125 (H) 07/25/2021 1558   BILITOT 0.2 07/25/2021 1558   BILITOT 0.2 01/24/2021 0843   GFRNONAA 108 01/24/2021 0843   GFRAA 124 01/24/2021 0843   Lab Results  Component Value Date   CHOL 191 05/14/2021   HDL 48 05/14/2021   LDLCALC 116 (H) 05/14/2021   TRIG 155 (H) 05/14/2021   CHOLHDL 4.0 05/14/2021   Lab Results  Component Value Date   HGBA1C 5.5  04/25/2021   Lab Results  Component Value Date   DQQIWLNL89 211 10/06/2017   Lab Results  Component Value Date   TSH 1.670 01/24/2021      ASSESSMENT AND PLAN 55 y.o. year old female  has a past medical history of Allergic state (12/08/2016), Allergy, Anxiety, Back pain, Fibroids, GERD (gastroesophageal reflux disease), Hyperlipidemia (06/09/2017), Insulin resistance, Joint pain, Knee pain, right (03/10/2017), Plantar fasciitis, PONV (postoperative nausea and vomiting), Shortness of breath, Sinusitis, Sleep apnea, Sleep apnea in adult (12/26/2020), Vitamin D deficiency (11/22/2016), and Vitamin D deficiency. here with:  OSA on CPAP  - CPAP compliance good - Good treatment of AHI  - Mask refitting ordered - Encourage patient to use CPAP nightly and > 4 hours each night - F/U in 1 year or sooner if needed   Ward Givens, MSN, NP-C 11/20/2021, 8:04 AM [facility] [address], [place], [place] [address] [phone]

## 2021-11-20 NOTE — Progress Notes (Addendum)
CM sent to [employer] for mask.  [name] Amass, RN got it!      Previous Messages   ----- Message -----  From: Brandon Melnick, RN  Sent: 11/20/2021   9:03 AM EST  To: Ocie Bob, *  Subject: cpap mask                                       Good Morning.   New order in Epic ( send correct mask size airfit F20 small).   Thank you, Lovey Newcomer RN   [place] [address]  Female, 55 y.o., 1966/09/04  Pronouns:  she/her/hers  MRN:  [id number]

## 2021-11-26 ENCOUNTER — Other Ambulatory Visit: Payer: Self-pay

## 2021-11-26 ENCOUNTER — Ambulatory Visit (INDEPENDENT_AMBULATORY_CARE_PROVIDER_SITE_OTHER): Payer: 59 | Admitting: Gastroenterology

## 2021-11-26 DIAGNOSIS — Z23 Encounter for immunization: Secondary | ICD-10-CM | POA: Diagnosis not present

## 2021-11-27 ENCOUNTER — Telehealth: Payer: 59 | Admitting: Physician Assistant

## 2021-11-27 ENCOUNTER — Other Ambulatory Visit (HOSPITAL_BASED_OUTPATIENT_CLINIC_OR_DEPARTMENT_OTHER): Payer: Self-pay

## 2021-11-27 DIAGNOSIS — J019 Acute sinusitis, unspecified: Secondary | ICD-10-CM

## 2021-11-27 DIAGNOSIS — H66003 Acute suppurative otitis media without spontaneous rupture of ear drum, bilateral: Secondary | ICD-10-CM | POA: Diagnosis not present

## 2021-11-27 DIAGNOSIS — B9789 Other viral agents as the cause of diseases classified elsewhere: Secondary | ICD-10-CM

## 2021-11-27 MED ORDER — AMOXICILLIN-POT CLAVULANATE 875-125 MG PO TABS
1.0000 | ORAL_TABLET | Freq: Two times a day (BID) | ORAL | 0 refills | Status: DC
  Filled 2021-11-27: qty 20, 10d supply, fill #0

## 2021-11-27 NOTE — Progress Notes (Signed)
E-Visit for Sinus Problems  We are sorry that you are not feeling well.  Here is how we plan to help!  Based on what you have shared with me it looks like you have sinusitis.  Sinusitis is inflammation and infection in the sinus cavities of the head.  Based on your presentation I believe you most likely have Acute Viral Sinusitis.This is an infection most likely caused by a virus. There is not specific treatment for viral sinusitis other than to help you with the symptoms until the infection runs its course.  You may use an oral decongestant such as Mucinex D or if you have glaucoma or high blood pressure use plain Mucinex. Keep up with your Flonase.  Giving what your colleague found on physical examination of the ears, we are concerns for bacterial otitis media -- as such continue care above and below but I am sending in an antibiotic, Augmentin for you to take as directed for this.   Some authorities believe that zinc sprays or the use of Echinacea may shorten the course of your symptoms.  Sinus infections are not as easily transmitted as other respiratory infection, however we still recommend that you avoid close contact with loved ones, especially the very young and elderly.  Remember to wash your hands thoroughly throughout the day as this is the number one way to prevent the spread of infection!  Home Care: Only take medications as instructed by your medical team. Do not take these medications with alcohol. A steam or ultrasonic humidifier can help congestion.  You can place a towel over your head and breathe in the steam from hot water coming from a faucet. Avoid close contacts especially the very young and the elderly. Cover your mouth when you cough or sneeze. Always remember to wash your hands.  Get Help Right Away If: You develop worsening fever or sinus pain. You develop a severe head ache or visual changes. Your symptoms persist after you have completed your treatment  plan.  Make sure you Understand these instructions. Will watch your condition. Will get help right away if you are not doing well or get worse.   Thank you for choosing an e-visit.  Your e-visit answers were reviewed by a board certified advanced clinical practitioner to complete your personal care plan. Depending upon the condition, your plan could have included both over the counter or prescription medications.  Please review your pharmacy choice. Make sure the pharmacy is open so you can pick up prescription now. If there is a problem, you may contact your provider through [employer] and have the prescription routed to another pharmacy.  Your safety is important to [name]. If you have drug allergies check your prescription carefully.   For the next 24 hours you can use MyChart to ask questions about today's visit, request a non-urgent call back, or ask for a work or school excuse. You will get an email in the next two days asking about your experience. I hope that your e-visit has been valuable and will speed your recovery.

## 2021-11-27 NOTE — Progress Notes (Signed)
I have spent 5 minutes in review of e-visit questionnaire, review and updating patient chart, medical decision making and response to patient.   Darius Lundberg Cody Donell Sliwinski, PA-C    

## 2021-11-28 ENCOUNTER — Ambulatory Visit: Payer: 59 | Admitting: Family Medicine

## 2021-11-28 DIAGNOSIS — G4733 Obstructive sleep apnea (adult) (pediatric): Secondary | ICD-10-CM | POA: Diagnosis not present

## 2021-12-03 ENCOUNTER — Other Ambulatory Visit (HOSPITAL_BASED_OUTPATIENT_CLINIC_OR_DEPARTMENT_OTHER): Payer: Self-pay

## 2021-12-03 MED FILL — Escitalopram Oxalate Tab 10 MG (Base Equiv): ORAL | 90 days supply | Qty: 90 | Fill #2 | Status: AC

## 2021-12-04 ENCOUNTER — Other Ambulatory Visit (HOSPITAL_BASED_OUTPATIENT_CLINIC_OR_DEPARTMENT_OTHER): Payer: Self-pay

## 2021-12-19 DIAGNOSIS — Z1231 Encounter for screening mammogram for malignant neoplasm of breast: Secondary | ICD-10-CM | POA: Diagnosis not present

## 2021-12-19 LAB — HM MAMMOGRAPHY

## 2021-12-27 ENCOUNTER — Other Ambulatory Visit (HOSPITAL_BASED_OUTPATIENT_CLINIC_OR_DEPARTMENT_OTHER): Payer: Self-pay

## 2021-12-31 ENCOUNTER — Other Ambulatory Visit (HOSPITAL_BASED_OUTPATIENT_CLINIC_OR_DEPARTMENT_OTHER): Payer: Self-pay

## 2022-01-14 ENCOUNTER — Other Ambulatory Visit (HOSPITAL_BASED_OUTPATIENT_CLINIC_OR_DEPARTMENT_OTHER): Payer: Self-pay

## 2022-02-09 ENCOUNTER — Other Ambulatory Visit: Payer: Self-pay | Admitting: Family Medicine

## 2022-02-11 ENCOUNTER — Other Ambulatory Visit (HOSPITAL_BASED_OUTPATIENT_CLINIC_OR_DEPARTMENT_OTHER): Payer: Self-pay

## 2022-02-11 MED ORDER — METFORMIN HCL ER 500 MG PO TB24
500.0000 mg | ORAL_TABLET | Freq: Every day | ORAL | 1 refills | Status: DC
  Filled 2022-02-11: qty 90, 90d supply, fill #0
  Filled 2022-07-07: qty 90, 90d supply, fill #1

## 2022-02-26 DIAGNOSIS — Z780 Asymptomatic menopausal state: Secondary | ICD-10-CM | POA: Diagnosis not present

## 2022-02-26 DIAGNOSIS — Z01419 Encounter for gynecological examination (general) (routine) without abnormal findings: Secondary | ICD-10-CM | POA: Diagnosis not present

## 2022-02-26 DIAGNOSIS — Z130 Encounter for screening for diseases of the blood and blood-forming organs and certain disorders involving the immune mechanism: Secondary | ICD-10-CM | POA: Diagnosis not present

## 2022-02-26 DIAGNOSIS — Z1389 Encounter for screening for other disorder: Secondary | ICD-10-CM | POA: Diagnosis not present

## 2022-03-05 ENCOUNTER — Other Ambulatory Visit: Payer: Self-pay | Admitting: Family Medicine

## 2022-03-05 ENCOUNTER — Other Ambulatory Visit (HOSPITAL_BASED_OUTPATIENT_CLINIC_OR_DEPARTMENT_OTHER): Payer: Self-pay

## 2022-03-05 MED ORDER — ESCITALOPRAM OXALATE 10 MG PO TABS
ORAL_TABLET | Freq: Every day | ORAL | 0 refills | Status: DC
  Filled 2022-03-05: qty 30, 30d supply, fill #0

## 2022-03-18 ENCOUNTER — Other Ambulatory Visit (HOSPITAL_BASED_OUTPATIENT_CLINIC_OR_DEPARTMENT_OTHER): Payer: Self-pay

## 2022-03-19 ENCOUNTER — Other Ambulatory Visit (HOSPITAL_BASED_OUTPATIENT_CLINIC_OR_DEPARTMENT_OTHER): Payer: Self-pay

## 2022-03-29 DIAGNOSIS — G4733 Obstructive sleep apnea (adult) (pediatric): Secondary | ICD-10-CM | POA: Diagnosis not present

## 2022-04-02 ENCOUNTER — Other Ambulatory Visit (HOSPITAL_BASED_OUTPATIENT_CLINIC_OR_DEPARTMENT_OTHER): Payer: Self-pay

## 2022-04-02 ENCOUNTER — Other Ambulatory Visit: Payer: Self-pay | Admitting: Family Medicine

## 2022-04-02 MED ORDER — ESCITALOPRAM OXALATE 10 MG PO TABS
ORAL_TABLET | Freq: Every day | ORAL | 0 refills | Status: DC
  Filled 2022-04-02: qty 30, 30d supply, fill #0

## 2022-04-09 ENCOUNTER — Other Ambulatory Visit: Payer: Self-pay | Admitting: Family Medicine

## 2022-04-10 ENCOUNTER — Other Ambulatory Visit (HOSPITAL_BASED_OUTPATIENT_CLINIC_OR_DEPARTMENT_OTHER): Payer: Self-pay

## 2022-04-10 MED ORDER — BUPROPION HCL 75 MG PO TABS
ORAL_TABLET | Freq: Every day | ORAL | 1 refills | Status: DC
  Filled 2022-04-10: qty 90, 90d supply, fill #0
  Filled 2022-07-04: qty 90, 90d supply, fill #1

## 2022-04-12 ENCOUNTER — Other Ambulatory Visit (HOSPITAL_BASED_OUTPATIENT_CLINIC_OR_DEPARTMENT_OTHER): Payer: Self-pay

## 2022-04-12 ENCOUNTER — Ambulatory Visit: Payer: 59 | Admitting: Family Medicine

## 2022-04-12 ENCOUNTER — Encounter: Payer: Self-pay | Admitting: Family Medicine

## 2022-04-12 VITALS — BP 128/86 | Ht 65.0 in | Wt 233.0 lb

## 2022-04-12 DIAGNOSIS — M7752 Other enthesopathy of left foot: Secondary | ICD-10-CM | POA: Insufficient documentation

## 2022-04-12 DIAGNOSIS — M722 Plantar fascial fibromatosis: Secondary | ICD-10-CM

## 2022-04-12 MED ORDER — MELOXICAM 15 MG PO TABS
15.0000 mg | ORAL_TABLET | Freq: Every day | ORAL | 1 refills | Status: DC
  Filled 2022-04-12: qty 45, 45d supply, fill #0
  Filled 2022-07-07: qty 45, 45d supply, fill #1

## 2022-04-12 NOTE — Progress Notes (Signed)
?Hailey Noble - 56 y.o. female MRN [id number]  Date of birth: 1966/05/15 ? ?SUBJECTIVE:  Including CC & ROS.  ?No chief complaint on file. ? ? ?Hailey Noble is a 56 y.o. female that is presenting with left heel pain and left second toe pain.  The pain has been ongoing for the past few weeks.  She does use orthotics on a regular basis.  No recent injury or inciting event.  She is getting over 10,000 steps in a day. ? ? ?Review of Systems ?See HPI  ? ?HISTORY: Past Medical, Surgical, Social, and Family History Reviewed & Updated per EMR.   ?Pertinent Historical Findings include: ? ?Past Medical History:  ?Diagnosis Date  ? Allergic state 12/08/2016  ? Allergy   ? Anxiety   ? Back pain   ? " i just have a weak loer back"   ? Fibroids   ? GERD (gastroesophageal reflux disease)   ? Hyperlipidemia 06/09/2017  ? Insulin resistance   ? Joint pain   ? Knee pain, right 03/10/2017  ? Plantar fasciitis   ? PONV (postoperative nausea and vomiting)   ? just nausea   ? Shortness of breath   ? not only if  i exert myselg   ? Sinusitis   ? Sleep apnea   ? Sleep apnea in adult 12/26/2020  ? Vitamin D deficiency 11/22/2016  ? Vitamin D deficiency   ? ? ?Past Surgical History:  ?Procedure Laterality Date  ? ABDOMINAL HYSTERECTOMY  09/2019  ? [place] spo  ? COLONOSCOPY  02-2009  ? with Henrene Pastor normal  ? CYSTOSCOPY N/A 10/12/2019  ? Procedure: CYSTOSCOPY;  Surgeon: Sherlyn Hay, DO;  Location[facility];  Service: Gynecology;  Laterality: N/A;  ? ESOPHAGOGASTRODUODENOSCOPY  2012  ? GANGLION CYST EXCISION Right   ? right- wrist   ? NASAL SEPTUM SURGERY    ? NASAL SINUS SURGERY    ? TMJ ARTHROSCOPY    ? left  ? TOTAL LAPAROSCOPIC HYSTERECTOMY WITH BILATERAL SALPINGO OOPHORECTOMY Bilateral 10/12/2019  ? Procedure: TOTAL LAPAROSCOPIC HYSTERECTOMY WITH BILATERAL SALPINGO OOPHORECTOMY, Lysis Pelvic Adhseions ;  Surgeon: Sherlyn Hay, DO;  Location: [place];  Service: Gynecology;  Laterality:  Bilateral;  ? ? ? ?PHYSICAL EXAM:  ?VS: BP 128/86 (BP Location: Left Arm, Patient Position: Sitting)   Ht '5\' 5"'$  (1.651 m)   Wt 233 lb (105.7 kg)   LMP 11/22/2018 (Approximate)   BMI 38.77 kg/m?  ?Physical Exam ?Gen: NAD, alert, cooperative with exam, well-appearing ?MSK:  ?Neurovascularly intact   ? ?ECSWT Note ?Hebert Soho ?1966/02/19 ? ?Procedure: ECSWT ?Indications: left foot pain  ? ?Procedure Details ?Consent: Risks of procedure as well as the alternatives and risks of each were explained to the (patient/caregiver).  Consent for procedure obtained. ?Time Out: Verified patient identification, verified procedure, site/side was marked, verified correct patient position, special equipment/implants available, medications/allergies/relevent history reviewed, required imaging and test results available.  Performed.  The area was cleaned with iodine and alcohol swabs.   ? ?The left foot was targeted for Extracorporeal shockwave therapy.  ? ?Preset: plantar fasciitis ?Power Level: 90 ?Frequency: 10 ?Impulse/cycles: 2500 ?Head size: medium  ?Session: 1st ? ?Patient did tolerate procedure well. ? ? ? ?ASSESSMENT & PLAN:  ? ?Plantar fasciitis of left foot ?Acutely occurring. Pain most consistent with plantar fascia pain. ?-Counseled on home exercise therapy and supportive. ?-Shockwave therapy today. ?-Could consider orthotics or injection. ? 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  Having pain at the second MTP joint.  Does have some loss of the transverse arch and she contributing to the capsulitis.  Seems less likely for Morton's neuroma. ?-Counseled on home exercise therapy and supportive care ?-Provided toe lift. ?-Could consider metatarsal padding or injection ? ? ? ? ?

## 2022-04-12 NOTE — Assessment & Plan Note (Signed)
Acutely occurring. Pain most consistent with plantar fascia pain. ?-Counseled on home exercise therapy and supportive. ?-Shockwave therapy today. ?-Could consider orthotics or injection. ?

## 2022-04-12 NOTE — Assessment & Plan Note (Signed)
Acutely occurring.  Having pain at the second MTP joint.  Does have some loss of the transverse arch and she contributing to the capsulitis.  Seems less likely for Morton's neuroma. ?-Counseled on home exercise therapy and supportive care ?-Provided toe lift. ?-Could consider metatarsal padding or injection ?

## 2022-04-18 ENCOUNTER — Other Ambulatory Visit (HOSPITAL_BASED_OUTPATIENT_CLINIC_OR_DEPARTMENT_OTHER): Payer: Self-pay

## 2022-04-19 ENCOUNTER — Other Ambulatory Visit (HOSPITAL_BASED_OUTPATIENT_CLINIC_OR_DEPARTMENT_OTHER): Payer: Self-pay

## 2022-04-25 ENCOUNTER — Other Ambulatory Visit (HOSPITAL_BASED_OUTPATIENT_CLINIC_OR_DEPARTMENT_OTHER): Payer: Self-pay

## 2022-04-25 ENCOUNTER — Telehealth: Payer: Self-pay | Admitting: *Deleted

## 2022-04-25 ENCOUNTER — Ambulatory Visit: Payer: 59 | Admitting: Family Medicine

## 2022-04-25 ENCOUNTER — Encounter: Payer: Self-pay | Admitting: Family Medicine

## 2022-04-25 VITALS — BP 134/74 | HR 81 | Ht 65.0 in | Wt 245.8 lb

## 2022-04-25 DIAGNOSIS — Z6841 Body Mass Index (BMI) 40.0 and over, adult: Secondary | ICD-10-CM

## 2022-04-25 DIAGNOSIS — F418 Other specified anxiety disorders: Secondary | ICD-10-CM

## 2022-04-25 DIAGNOSIS — E785 Hyperlipidemia, unspecified: Secondary | ICD-10-CM | POA: Diagnosis not present

## 2022-04-25 DIAGNOSIS — M722 Plantar fascial fibromatosis: Secondary | ICD-10-CM | POA: Diagnosis not present

## 2022-04-25 DIAGNOSIS — E559 Vitamin D deficiency, unspecified: Secondary | ICD-10-CM | POA: Diagnosis not present

## 2022-04-25 DIAGNOSIS — R748 Abnormal levels of other serum enzymes: Secondary | ICD-10-CM

## 2022-04-25 LAB — COMPREHENSIVE METABOLIC PANEL
ALT: 20 U/L (ref 0–35)
AST: 17 U/L (ref 0–37)
Albumin: 4.2 g/dL (ref 3.5–5.2)
Alkaline Phosphatase: 142 U/L — ABNORMAL HIGH (ref 39–117)
BUN: 13 mg/dL (ref 6–23)
CO2: 28 mEq/L (ref 19–32)
Calcium: 8.9 mg/dL (ref 8.4–10.5)
Chloride: 104 mEq/L (ref 96–112)
Creatinine, Ser: 0.58 mg/dL (ref 0.40–1.20)
GFR: 100.77 mL/min (ref >60.00)
Glucose, Bld: 84 mg/dL (ref 70–99)
Potassium: 4.4 mEq/L (ref 3.5–5.1)
Sodium: 139 mEq/L (ref 135–145)
Total Bilirubin: 0.3 mg/dL (ref 0.2–1.2)
Total Protein: 6.9 g/dL (ref 6.0–8.3)

## 2022-04-25 LAB — LIPID PANEL
Cholesterol: 187 mg/dL (ref 0–200)
HDL: 47 mg/dL (ref >39.00)
NonHDL: 139.55
Total CHOL/HDL Ratio: 4
Triglycerides: 303 mg/dL — ABNORMAL HIGH (ref 0.0–149.0)
VLDL: 60.6 mg/dL — ABNORMAL HIGH (ref 0.0–40.0)

## 2022-04-25 LAB — CBC
HCT: 39.7 % (ref 36.0–46.0)
Hemoglobin: 12.9 g/dL (ref 12.0–15.0)
MCHC: 32.6 g/dL (ref 30.0–36.0)
MCV: 88 fl (ref 78.0–100.0)
Platelets: 337 10*3/uL (ref 150.0–400.0)
RBC: 4.51 Mil/uL (ref 3.87–5.11)
RDW: 13.5 % (ref 11.5–15.5)
WBC: 7.5 10*3/uL (ref 4.0–10.5)

## 2022-04-25 LAB — TSH: TSH: 1.83 u[IU]/mL (ref 0.35–5.50)

## 2022-04-25 LAB — HEMOGLOBIN A1C: Hgb A1c MFr Bld: 5.6 % (ref 4.6–6.5)

## 2022-04-25 LAB — LDL CHOLESTEROL, DIRECT: Direct LDL: 113 mg/dL

## 2022-04-25 LAB — VITAMIN D 25 HYDROXY (VIT D DEFICIENCY, FRACTURES): VITD: 33.65 ng/mL (ref 30.00–100.00)

## 2022-04-25 MED ORDER — WEGOVY 0.25 MG/0.5ML ~~LOC~~ SOAJ
0.2500 mg | SUBCUTANEOUS | 3 refills | Status: DC
Start: 2022-04-25 — End: 2022-08-29
  Filled 2022-04-25: qty 2, 28d supply, fill #0

## 2022-04-25 MED ORDER — ESCITALOPRAM OXALATE 10 MG PO TABS
10.0000 mg | ORAL_TABLET | Freq: Every day | ORAL | 3 refills | Status: DC
Start: 2022-04-25 — End: 2022-09-26
  Filled 2022-04-25 – 2022-04-26 (×2): qty 30, 30d supply, fill #0
  Filled 2022-06-11: qty 30, 30d supply, fill #1
  Filled 2022-07-07: qty 30, 30d supply, fill #2
  Filled 2022-08-08: qty 30, 30d supply, fill #3

## 2022-04-25 MED ORDER — HYDROXYZINE PAMOATE 25 MG PO CAPS
25.0000 mg | ORAL_CAPSULE | Freq: Three times a day (TID) | ORAL | 0 refills | Status: DC | PRN
  Filled 2022-04-25: qty 30, 10d supply, fill #0

## 2022-04-25 MED ORDER — ESCITALOPRAM OXALATE 5 MG PO TABS
5.0000 mg | ORAL_TABLET | Freq: Every day | ORAL | 3 refills | Status: DC
  Filled 2022-04-25: qty 30, 30d supply, fill #0
  Filled ????-??-??: fill #1

## 2022-04-25 NOTE — Assessment & Plan Note (Signed)
Completed orthotics today.  ?- may need a lateral wedge on right ?

## 2022-04-25 NOTE — Telephone Encounter (Signed)
PA submitted for Wegovy 0.'25mg'$ .  Key [id number]. ?

## 2022-04-25 NOTE — Progress Notes (Signed)
? ?Acute Office Visit ? ?Subjective:  ? ?  ?Patient ID: Hailey Noble, female    DOB: 16-Feb-1966, 56 y.o.   MRN: [id number] ? ?CC: weight management, stress  ? ? ?HPI ?Patient is in today for weight management, stress.  ? ?Patient recently got engaged and future MIL is manipulative, alcoholic; last night they had to help her twice during the night due to drinking/falls. Her fiance is the only caregiver for her and pt thinks she is acting out now that he is planning to get married. These instances are stressing her out and she is constantly worried about future-MIL calling to the point that she cannot get any of her work/personal things done while planning a wedding.  ?States she needs help to not worry about things she cannot control. ?She has been stress eating and gaining a lot of weight - interested in weight loss options bc she knows the added weight is affecting her blood pressure, etc. She is thinking that maybe a slightly bump in her Lexapro may be helpful, but she doesn't want to make any drastic changes yet. Denies SI/HI/ ? ?She is wanting to restart [facility] as this helped in the past - she stopped for awhile to see if she could do it on her own, but has been struggling to get the weight off. She did not have any side effects previously. She tries to stay active and make healthy food choices, but admits to stress-eating lately.  ? ? ?  04/25/2022  ?  9:40 AM 08/03/2021  ? 11:30 AM 07/03/2021  ?  2:22 PM  ?PHQ9 SCORE ONLY  ?PHQ-9 Total Score 20 0 0  ? ? ?  04/25/2022  ?  9:40 AM  ?GAD 7 : Generalized Anxiety Score  ?Nervous, Anxious, on Edge 3  ?Control/stop worrying 3  ?Worry too much - different things 3  ?Trouble relaxing 3  ?Restless 3  ?Easily annoyed or irritable 3  ?Afraid - awful might happen 3  ?Total GAD 7 Score 21  ?Anxiety Difficulty Extremely difficult  ? ? ? ? ? ?ROS ?All review of systems negative except what is listed in the HPI ? ? ?   ?Objective:  ?  ?BP 134/74   Pulse 81   Ht '5\' 5"'$  (1.651 m)    Wt 245 lb 12.8 oz (111.5 kg)   LMP 11/22/2018 (Approximate)   BMI 40.90 kg/m?  ? ? ?Physical Exam ?Vitals reviewed.  ?Constitutional:   ?   Appearance: Normal appearance. She is obese.  ?Cardiovascular:  ?   Rate and Rhythm: Normal rate and regular rhythm.  ?Pulmonary:  ?   Effort: Pulmonary effort is normal.  ?   Breath sounds: Normal breath sounds.  ?Musculoskeletal:  ?   Right lower leg: No edema.  ?   Left lower leg: No edema.  ?Skin: ?   General: Skin is warm and dry.  ?Neurological:  ?   General: No focal deficit present.  ?   Mental Status: She is alert and oriented to person, place, and time. Mental status is at baseline.  ?Psychiatric:     ?   Mood and Affect: Mood normal.     ?   Behavior: Behavior normal.     ?   Thought Content: Thought content normal.     ?   Judgment: Judgment normal.  ? ? ?No results found for any visits on 04/25/22. ? ? ?   ?Assessment & Plan:  ? ? ?1. Class  3 severe obesity without serious comorbidity with body mass index (BMI) of 40.0 to 44.9 in adult, unspecified obesity type ([place]) ?Updating labs. Adding Wegovy, pending insurance approval. She has tolerated this well in the past. No questions or concerns. Reinforced significance of continuing healthy diet and regular physical activity.  ?- Comprehensive metabolic panel ?- Hemoglobin A1c ?- Lipid panel ?- TSH ?- CBC ?- Semaglutide-Weight Management (WEGOVY) 0.25 MG/0.5ML SOAJ; Inject 0.25 mg into the skin once a week. After 4 weeks, can increase to 0.5 mg weekly for 4 weeks, then 1 mg weekly for 4 weeks  Dispense: 2 mL; Refill: 3 ? ?2. Depression with anxiety ?She is agreeable to small increase in Lexapro - will up to 15 mg daily total. Referral placed for counseling. Adding PRN hydroxyzine for periods of heightened anxiety/stress. No SI/HI. Patient aware of signs/symptoms requiring further/urgent evaluation.  ?- Ambulatory referral to [facility] ?- escitalopram (LEXAPRO) 10 MG tablet; Take 1 tablet (10 mg total) by mouth  daily.  Dispense: 30 tablet; Refill: 3 ?- escitalopram (LEXAPRO) 5 MG tablet; Take 1 tablet (5 mg total) by mouth daily. Take with the Lexapro 10 mg to total 15 mg daily  Dispense: 30 tablet; Refill: 3 ?- hydrOXYzine (VISTARIL) 25 MG capsule; Take 1 capsule (25 mg total) by mouth every 8 (eight) hours as needed for anxiety.  Dispense: 30 capsule; Refill: 0 ? ?3. Vitamin D deficiency ?Updating labs. No new symptoms.  ?- VITAMIN D 25 Hydroxy (Vit-D Deficiency, Fractures) ? ? ? ?Return in about 2 months (around 06/25/2022) for mood f/u, weight check. ? ?Terrilyn Saver, NP ? ? ?

## 2022-04-25 NOTE — Patient Instructions (Signed)
Updating blood work today since it has been awhile ?Trying a light increase in the Lexapro and adding as needed hydroxyzine for anxiety. Referral for counseling sent - someone will be calling you.  ?Restarting [id number]. Continue with lifestyle modifications.  ?

## 2022-04-25 NOTE — Progress Notes (Signed)
?  Hailey Noble - 56 y.o. female MRN [id number]  Date of birth: 1966-12-18 ? ?SUBJECTIVE:  Including CC & ROS.  ?No chief complaint on file. ? ? ?Hailey Noble is a 56 y.o. female that is here for orthotics. ? ? ? ?Review of Systems ?See HPI  ? ?HISTORY: Past Medical, Surgical, Social, and Family History Reviewed & Updated per EMR.   ?Pertinent Historical Findings include: ? ?Past Medical History:  ?Diagnosis Date  ? Allergic state 12/08/2016  ? Allergy   ? Anxiety   ? Back pain   ? " i just have a weak loer back"   ? Fibroids   ? GERD (gastroesophageal reflux disease)   ? Hyperlipidemia 06/09/2017  ? Insulin resistance   ? Joint pain   ? Knee pain, right 03/10/2017  ? Plantar fasciitis   ? PONV (postoperative nausea and vomiting)   ? just nausea   ? Shortness of breath   ? not only if  i exert myselg   ? Sinusitis   ? Sleep apnea   ? Sleep apnea in adult 12/26/2020  ? Vitamin D deficiency 11/22/2016  ? Vitamin D deficiency   ? ? ?Past Surgical History:  ?Procedure Laterality Date  ? ABDOMINAL HYSTERECTOMY  09/2019  ? tah spo  ? COLONOSCOPY  02-2009  ? with Henrene Pastor normal  ? CYSTOSCOPY N/A 10/12/2019  ? Procedure: CYSTOSCOPY;  Surgeon: Sherlyn Hay, DO;  [facility];  Service: Gynecology;  Laterality: N/A;  ? ESOPHAGOGASTRODUODENOSCOPY  2012  ? GANGLION CYST EXCISION Right   ? right- wrist   ? NASAL SEPTUM SURGERY    ? NASAL SINUS SURGERY    ? TMJ ARTHROSCOPY    ? left  ? TOTAL LAPAROSCOPIC HYSTERECTOMY WITH BILATERAL SALPINGO OOPHORECTOMY Bilateral 10/12/2019  ? Procedure: TOTAL LAPAROSCOPIC HYSTERECTOMY WITH BILATERAL SALPINGO OOPHORECTOMY, Lysis Pelvic Adhseions ;  Surgeon: Sherlyn Hay, DO;  Location: [place];  Service: Gynecology;  Laterality: Bilateral;  ? ? ? ?PHYSICAL EXAM:  ?VS: Ht '5\' 5"'$  (1.651 m)   Wt 245 lb (111.1 kg)   LMP 11/22/2018 (Approximate)   BMI 40.77 kg/m?  ?Physical Exam ?Gen: NAD, alert, cooperative with exam, well-appearing ?MSK:   ?Neurovascularly intact   ? ?Patient was fitted for a standard, cushioned, semi-rigid orthotic. ?The orthotic was heated and afterward the patient stood on the orthotic blank positioned on the orthotic stand. ?The patient was positioned in subtalar neutral position and 10 degrees of ankle dorsiflexion in a weight bearing stance. ?After completion of molding, a stable base was applied to the orthotic blank. ?The blank was ground to a stable position for weight bearing. ?Size: 8 ?Pairs: 2 ?Base: [facility] EVA ?Additional Posting and Padding: None ?The patient ambulated these, and they were very comfortable. ? ? ?ASSESSMENT & PLAN:  ? ?Plantar fasciitis of left foot ?Completed orthotics today.  ?- may need a lateral wedge on right ? ? ? ? ?

## 2022-04-26 ENCOUNTER — Other Ambulatory Visit (HOSPITAL_BASED_OUTPATIENT_CLINIC_OR_DEPARTMENT_OTHER): Payer: Self-pay

## 2022-04-26 NOTE — Addendum Note (Signed)
Addended by: Caleen Jobs B on: 04/26/2022 12:55 PM ? ? Modules accepted: Orders ? ?

## 2022-04-29 ENCOUNTER — Other Ambulatory Visit (HOSPITAL_BASED_OUTPATIENT_CLINIC_OR_DEPARTMENT_OTHER): Payer: Self-pay

## 2022-04-30 NOTE — Telephone Encounter (Signed)
The request has been approved. The authorization is effective for a maximum of 7 fills from 04/27/2022 to 11/23/2022, as long as the member is enrolled in their current health plan. ?

## 2022-05-16 ENCOUNTER — Other Ambulatory Visit: Payer: Self-pay | Admitting: Gastroenterology

## 2022-05-17 ENCOUNTER — Other Ambulatory Visit (HOSPITAL_BASED_OUTPATIENT_CLINIC_OR_DEPARTMENT_OTHER): Payer: Self-pay

## 2022-05-17 MED ORDER — PANTOPRAZOLE SODIUM 40 MG PO TBEC
40.0000 mg | DELAYED_RELEASE_TABLET | Freq: Every day | ORAL | 3 refills | Status: DC
  Filled 2022-05-17: qty 90, 90d supply, fill #0
  Filled 2022-10-07: qty 90, 90d supply, fill #1

## 2022-06-04 DIAGNOSIS — H5203 Hypermetropia, bilateral: Secondary | ICD-10-CM | POA: Diagnosis not present

## 2022-06-10 ENCOUNTER — Ambulatory Visit: Payer: 59 | Admitting: Podiatry

## 2022-06-10 DIAGNOSIS — M2011 Hallux valgus (acquired), right foot: Secondary | ICD-10-CM

## 2022-06-10 DIAGNOSIS — M21611 Bunion of right foot: Secondary | ICD-10-CM | POA: Diagnosis not present

## 2022-06-10 DIAGNOSIS — L600 Ingrowing nail: Secondary | ICD-10-CM

## 2022-06-10 NOTE — Progress Notes (Signed)
  Subjective:  Patient ID: Hailey Noble, female    DOB: 1966/02/08,  MRN: [id number]  Chief Complaint  Patient presents with   Ingrown Toenail       (NP) RIGHT INGROWN GREAT TOE, BUNION FORMING & PF    56 y.o. female presents with the above complaint. History confirmed with patient.  She previously damaged her left great toenail about 10 years ago a lawnmower ran over it.  She also has an ingrowing corner on the right medial side that is now doing better but was very painful after a pedicure few months ago.  Also getting a bunion on the right foot shoes rub on this.  Objective:  Physical Exam: warm, good capillary refill, no trophic changes or ulcerative lesions, normal DP and PT pulses, and normal sensory exam. Left Foot:  Dystrophic lateral border hallux nail, not painful Right Foot: Incurvated medial border no pain of hallux nail, she does have hallux valgus deformity with a bunion, reducible and good range of motion of the toe. Assessment:   1. Ingrowing right great toenail   2. Ingrowing left great toenail   3. Hallux valgus with bunions, right      Plan:  Patient was evaluated and treated and all questions answered.  We discussed treatment of the ingrowing and dystrophic nails and currently they are not symptomatic for.  We discussed the option of partial permanent matricectomy's if these return or worsen we discussed the recovery process of these.  She will return to see me as needed for this.  We also discussed the etiology and treatment options of hallux valgus and how much of this is hereditary.  We discussed surgical and nonsurgical treatment.  Currently only minimally symptomatic.  We discussed padding with a silicone pad and wearing wider shoes and the types of shoes that can cause more pressure on these.  If this begins or worsens we will take x-rays and discussed surgical options if it does not improve. Return if symptoms worsen or fail to improve.

## 2022-06-12 ENCOUNTER — Other Ambulatory Visit (HOSPITAL_BASED_OUTPATIENT_CLINIC_OR_DEPARTMENT_OTHER): Payer: Self-pay

## 2022-06-14 ENCOUNTER — Other Ambulatory Visit (HOSPITAL_BASED_OUTPATIENT_CLINIC_OR_DEPARTMENT_OTHER): Payer: Self-pay

## 2022-06-18 ENCOUNTER — Ambulatory Visit (INDEPENDENT_AMBULATORY_CARE_PROVIDER_SITE_OTHER): Payer: 59 | Admitting: Psychologist

## 2022-06-18 DIAGNOSIS — F411 Generalized anxiety disorder: Secondary | ICD-10-CM | POA: Diagnosis not present

## 2022-06-18 NOTE — Progress Notes (Signed)
                Hailey Alvidrez, PsyD 

## 2022-07-05 ENCOUNTER — Other Ambulatory Visit (HOSPITAL_BASED_OUTPATIENT_CLINIC_OR_DEPARTMENT_OTHER): Payer: Self-pay

## 2022-07-07 ENCOUNTER — Other Ambulatory Visit: Payer: Self-pay | Admitting: Gastroenterology

## 2022-07-07 ENCOUNTER — Other Ambulatory Visit: Payer: Self-pay | Admitting: Family Medicine

## 2022-07-08 ENCOUNTER — Other Ambulatory Visit (HOSPITAL_BASED_OUTPATIENT_CLINIC_OR_DEPARTMENT_OTHER): Payer: Self-pay

## 2022-07-08 MED ORDER — FLUTICASONE PROPIONATE 50 MCG/ACT NA SUSP
2.0000 | Freq: Every day | NASAL | 5 refills | Status: DC
  Filled 2022-07-08: qty 16, 30d supply, fill #0
  Filled 2022-10-07: qty 16, 30d supply, fill #1
  Filled 2022-12-17 (×3): qty 16, 30d supply, fill #2

## 2022-07-08 MED ORDER — URSODIOL 500 MG PO TABS
500.0000 mg | ORAL_TABLET | Freq: Two times a day (BID) | ORAL | 6 refills | Status: DC
  Filled 2022-07-08: qty 60, 30d supply, fill #0
  Filled 2022-08-08 – 2022-08-09 (×2): qty 60, 30d supply, fill #1
  Filled 2022-10-07: qty 60, 30d supply, fill #2

## 2022-07-15 ENCOUNTER — Ambulatory Visit (INDEPENDENT_AMBULATORY_CARE_PROVIDER_SITE_OTHER): Payer: 59 | Admitting: Psychologist

## 2022-07-15 DIAGNOSIS — F411 Generalized anxiety disorder: Secondary | ICD-10-CM | POA: Diagnosis not present

## 2022-07-15 NOTE — Progress Notes (Signed)
                Hailey Goodhart, PsyD 

## 2022-07-15 NOTE — Progress Notes (Signed)
[facility] Counselor/Therapist Progress Note  Patient ID: Hailey Noble, MRN: [id number],    Date: 07/15/2022  Time Spent: 2:04 pm to 2:22 pm; total time: 18 minutes   This session was held via in person. The patient consented to in-person therapy and was in the clinician's office. Limits of confidentiality were discussed with the patient.   Treatment Type: Individual Therapy  Reported Symptoms: Denied experiencing symptoms.   Mental Status Exam: Appearance:  Well Groomed     Behavior: Appropriate  Motor: Normal  Speech/Language:  Clear and Coherent  Affect: Appropriate  Mood: normal  Thought process: normal  Thought content:   WNL  Sensory/Perceptual disturbances:   WNL  Orientation: oriented to person, place, and time/date  Attention: Good  Concentration: Good  Memory: WNL  Fund of knowledge:  Good  Insight:   Fair  Judgment:  Good  Impulse Control: Good   Risk Assessment: Danger to Self:  No Self-injurious Behavior: No Danger to Others: No Duty to Warn:no Physical Aggression / Violence:No  Access to Firearms a concern: No  Gang Involvement:No   Subjective: Beginning the session, patient denied having any concerns. After reviewing the treatment plan, patient described that the events that contributed to seeking out counseling have been resolved. She briefly processed thoughts and emotions. She explored potential barriers and processed how to address them. She denied suicidal and homicidal ideation.    Interventions:  Worked on developing a therapeutic relationship with the patient using active listening and reflective statements. Provided emotional support using empathy and validation. Reviewed the treatment plan with the patient. Reviewed events since the intake. Praised the patient for doing well and explored what has assisted the patient. Used metaphors to assist the patient. Used socratic questions to assist the patient gain insight. Used summary and  reflective statements. Explored potential barriers. Processed and reflected on ways to address potential barriers. Normalized and validated expressed thoughts. Identified the theme of self-care and how it has assisted the patient. Discussed options for counseling Assessed for suicidal and homicidal ideation.   Homework: NA  Next Session: NA  Diagnosis: F41.1 generalized anxiety disorder  Plan:   Goals Reduce overall frequency, intensity, and duration of anxiety Stabilize anxiety level wile increasing ability to function Enhance ability to effectively cope with full variety of stressors Learn and implement coping skills that result in a reduction of anxiety   Objectives target date for all objectives is 06/19/2023 Verbalize an understanding of the cognitive, physiological, and behavioral components of anxiety Learning and implement calming skills to reduce overall anxiety Verbalize an understanding of the role that cognitive biases play in excessive irrational worry and persistent anxiety symptoms Identify, challenge, and replace based fearful talk Learn and implement problem solving strategies Identify and engage in pleasant activities Learning and implement personal and interpersonal skills to reduce anxiety and improve interpersonal relationships Learn to accept limitations in life and commit to tolerating, rather than avoiding, unpleasant emotions while accomplishing meaningful goals Identify major life conflicts from the past and present that form the basis for present anxiety Maintain involvement in work, family, and social activities Reestablish a consistent sleep-wake cycle Cooperate with a medical evaluation  Interventions Engage the patient in behavioral activation Use instruction, modeling, and role-playing to build the client's general social, communication, and/or conflict resolution skills Use Acceptance and Commitment Therapy to help client accept uncomfortable realities  in order to accomplish value-consistent goals Reinforce the client's insight into the role of his/her past emotional pain and present anxiety  Support the client in following through with work, family, and social activities Teach and implement sleep hygiene practices  Refer the patient to a physician for a psychotropic medication consultation Monito the clint's psychotropic medication compliance Discuss how anxiety typically involves excessive worry, various bodily expressions of tension, and avoidance of what is threatening that interact to maintain the problem  Teach the patient relaxation skills Assign the patient homework Discuss examples demonstrating that unrealistic worry overestimates the probability of threats and underestimates patient's ability  Assist the patient in analyzing his or her worries Help patient understand that avoidance is reinforcing   The patient and clinician reviewed the treatment plan on 07/15/2022. The patient approved of the treatment plan.   Conception Chancy, PsyD

## 2022-07-31 ENCOUNTER — Encounter (INDEPENDENT_AMBULATORY_CARE_PROVIDER_SITE_OTHER): Payer: Self-pay

## 2022-08-09 ENCOUNTER — Other Ambulatory Visit (HOSPITAL_BASED_OUTPATIENT_CLINIC_OR_DEPARTMENT_OTHER): Payer: Self-pay

## 2022-08-12 ENCOUNTER — Other Ambulatory Visit (HOSPITAL_BASED_OUTPATIENT_CLINIC_OR_DEPARTMENT_OTHER): Payer: Self-pay

## 2022-08-16 ENCOUNTER — Telehealth: Payer: 59 | Admitting: Family Medicine

## 2022-08-16 ENCOUNTER — Other Ambulatory Visit (HOSPITAL_BASED_OUTPATIENT_CLINIC_OR_DEPARTMENT_OTHER): Payer: Self-pay

## 2022-08-16 DIAGNOSIS — L237 Allergic contact dermatitis due to plants, except food: Secondary | ICD-10-CM

## 2022-08-16 MED ORDER — PREDNISONE 10 MG PO TABS
10.0000 mg | ORAL_TABLET | Freq: Every day | ORAL | 0 refills | Status: DC
  Filled 2022-08-16: qty 1, 1d supply, fill #0

## 2022-08-16 MED ORDER — PREDNISONE 10 MG PO TABS
10.0000 mg | ORAL_TABLET | Freq: Every day | ORAL | 0 refills | Status: AC
  Filled 2022-08-16: qty 21, 6d supply, fill #0

## 2022-08-16 NOTE — Addendum Note (Signed)
Addended by: Dellia Nims on: 08/16/2022 11:25 AM   Modules accepted: Orders

## 2022-08-16 NOTE — Progress Notes (Signed)
E Visit for Rash  We are sorry that you are not feeling well. Here is how we plan to help!  Based on what you shared with me it looks like you have contact dermatitis.  Contact dermatitis is a skin rash caused by something that touches the skin and causes irritation or inflammation.  Your skin may be red, swollen, dry, cracked, and itch.  The rash should go away in a few days but can last a few weeks.  If you get a rash, it's important to figure out what caused it so the irritant can be avoided in the future.  Based on what you shared with me you may have a virus or an allergic reaction.  Avoid contact with pregnant women until a diagnosis is made.  Most viral rashes are contagious (especially if a fever is present).  You can return to work or school after the rash is gone or when your doctor says it is safe to return with the rash.    Prednisone 10 mg daily for 6 days (see taper instructions below)   HOME CARE:  Take cool showers and avoid direct sunlight. Apply cool compress or wet dressings. Take a bath in an oatmeal bath.  Sprinkle content of one Aveeno packet under running faucet with comfortably warm water.  Bathe for 15-20 minutes, 1-2 times daily.  Pat dry with a towel. Do not rub the rash. Use hydrocortisone cream. Take an antihistamine like Benadryl for widespread rashes that itch.  The adult dose of Benadryl is 25-50 mg by mouth 4 times daily. Caution:  This type of medication may cause sleepiness.  Do not drink alcohol, drive, or operate dangerous machinery while taking antihistamines.  Do not take these medications if you have prostate enlargement.  Read package instructions thoroughly on all medications that you take.  GET HELP RIGHT AWAY IF:  Symptoms don't go away after treatment. Severe itching that persists. If you rash spreads or swells. If you rash begins to smell. If it blisters and opens or develops a yellow-brown crust. You develop a fever. You have a sore  throat. You become short of breath.  MAKE SURE YOU:  Understand these instructions. Will watch your condition. Will get help right away if you are not doing well or get worse.  Thank you for choosing an e-visit.  Your e-visit answers were reviewed by a board certified advanced clinical practitioner to complete your personal care plan. Depending upon the condition, your plan could have included both over the counter or prescription medications.  Please review your pharmacy choice. Make sure the pharmacy is open so you can pick up prescription now. If there is a problem, you may contact your provider through [employer] and have the prescription routed to another pharmacy.  Your safety is important to [name]. If you have drug allergies check your prescription carefully.   For the next 24 hours you can use MyChart to ask questions about today's visit, request a non-urgent call back, or ask for a work or school excuse. You will get an email in the next two days asking about your experience. I hope that your e-visit has been valuable and will speed your recovery.   I have provided 5 minutes of non face to face time during this encounter for chart review and documentation.

## 2022-08-29 ENCOUNTER — Ambulatory Visit: Payer: 59 | Admitting: Family Medicine

## 2022-08-29 ENCOUNTER — Encounter: Payer: Self-pay | Admitting: Family Medicine

## 2022-08-29 ENCOUNTER — Other Ambulatory Visit (HOSPITAL_BASED_OUTPATIENT_CLINIC_OR_DEPARTMENT_OTHER): Payer: Self-pay

## 2022-08-29 VITALS — BP 130/84 | HR 67 | Temp 98.1°F | Resp 18 | Ht 65.0 in | Wt 235.8 lb

## 2022-08-29 DIAGNOSIS — L237 Allergic contact dermatitis due to plants, except food: Secondary | ICD-10-CM

## 2022-08-29 DIAGNOSIS — Z6841 Body Mass Index (BMI) 40.0 and over, adult: Secondary | ICD-10-CM

## 2022-08-29 MED ORDER — PREDNISONE 10 MG PO TABS
ORAL_TABLET | ORAL | 0 refills | Status: DC
  Filled 2022-08-29: qty 20, 12d supply, fill #0

## 2022-08-29 MED ORDER — METHYLPREDNISOLONE ACETATE 80 MG/ML IJ SUSP
80.0000 mg | Freq: Once | INTRAMUSCULAR | Status: AC
Start: 2022-08-29 — End: 2022-08-29
  Administered 2022-08-29: 80 mg via INTRAMUSCULAR

## 2022-08-29 MED ORDER — WEGOVY 0.25 MG/0.5ML ~~LOC~~ SOAJ
0.2500 mg | SUBCUTANEOUS | 3 refills | Status: DC
  Filled 2022-08-29: qty 2, 28d supply, fill #0
  Filled ????-??-??: fill #1

## 2022-08-29 NOTE — Progress Notes (Signed)
Established Patient Office Visit  Subjective   Patient ID: Hailey Noble, female    DOB: [birth year]  Age: 56 y.o. MRN: [id number]  Chief Complaint  Patient presents with   Rash    Pt states having skin concern and states x2 weeks ago she was messing around in the yard. Pt states she had a virtual visit and she was given prednisone tabs. Rash is spreading on arms, stomach, back of the knees.     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 04/12/2022   Rash and other nonspecific skin eruption 01/30/2021   Pruritus 01/30/2021   Other rhinitis 01/30/2021   Adverse reaction to food, subsequent encounter 01/30/2021   Sleep apnea in adult 12/26/2020   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 06/01/2020   Left arm pain 06/01/2020   Peroneal tendinitis of left lower leg 02/16/2020   Leukocytosis 12/02/2019   Hyperglycemia 12/02/2019   Pain of left heel 12/02/2019   Pelvic pain 10/12/2019   Post-operative state 10/12/2019   Pain, abdominal, RLQ 11/28/2017   Insulin resistance 10/21/2017   Hyperlipidemia 06/09/2017   Knee pain, right 03/10/2017   Allergic state 12/08/2016   Vitamin D deficiency 11/22/2016   Depression with anxiety 04/06/2014   Obesity (BMI 30-39.9) 04/06/2014   Preventative health care 06/17/2011   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   INGUINAL PAIN, LEFT 12/05/2008   Past Medical History:  Diagnosis Date   Allergic state 12/08/2016   Allergy     Anxiety    Back pain    " i just have a weak loer back"    Fibroids    GERD (gastroesophageal reflux disease)    Hyperlipidemia 06/09/2017   Insulin resistance    Joint pain    Knee pain, right 03/10/2017   Plantar fasciitis    PONV (postoperative nausea and vomiting)    just nausea    Shortness of breath    not only if  i exert myselg    Sinusitis    Sleep apnea    Sleep apnea in adult 12/26/2020   Vitamin D deficiency 11/22/2016   Vitamin D deficiency    Past Surgical History:  Procedure Laterality Date   ABDOMINAL HYSTERECTOMY  09/2019   [place] spo   COLONOSCOPY  02-2009   with Henrene Pastor normal   CYSTOSCOPY N/A 10/12/2019   Procedure: CYSTOSCOPY;  Surgeon: Sherlyn Hay, DO;  Location: [facility];  Service: Gynecology;  Laterality: N/A;   ESOPHAGOGASTRODUODENOSCOPY  2012   GANGLION CYST EXCISION Right    right- wrist    NASAL SEPTUM SURGERY     NASAL SINUS SURGERY     TMJ ARTHROSCOPY     left   TOTAL LAPAROSCOPIC HYSTERECTOMY WITH BILATERAL SALPINGO OOPHORECTOMY Bilateral 10/12/2019   Procedure: TOTAL LAPAROSCOPIC HYSTERECTOMY WITH BILATERAL SALPINGO OOPHORECTOMY, Lysis Pelvic Adhseions ;  Surgeon: Sherlyn Hay, DO;  Location: [place];  Service: Gynecology;  Laterality: Bilateral;   Social History   Tobacco Use   Smoking status: Never   Smokeless tobacco: Never  Vaping Use   Vaping Use: Never used  Substance Use Topics   Alcohol use: Yes    Alcohol/week: 0.0 standard drinks of alcohol    Comment: rare   Drug use: No   Social History   Socioeconomic History   Marital status: Single    Spouse name: Not on file   Number of children: Not on file   Years of education: Not on file   Highest education level: Not on file  Occupational History   Occupation: Therapist, sports    Employer:   Tobacco Use   Smoking status: Never   Smokeless tobacco: Never  Vaping Use   Vaping Use: Never used  Substance and Sexual  Activity   Alcohol use: Yes    Alcohol/week: 0.0 standard drinks of alcohol    Comment: rare   Drug use: No   Sexual activity: Yes    Birth control/protection: I.U.D.  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 Works at [employer]. No dietary restrictions. Lives with dog   Social Determinants of Health   Financial Resource Strain: Not on file  Food Insecurity: Not on file  Transportation Needs: Not on file  Physical Activity: Not on file  Stress: Not on file  Social Connections: Not on file  Intimate Partner Violence: Not on file   Family Status  Relation Name Status   Father 67 Alive   Mother 75 Alive   PGF 75 Deceased   Sister 43 Alive   Brother  Alive   MGM 64 Deceased   MGF 72 Deceased   PGM mid 49 Deceased   Sister 60 Alive   Neg Hx  (Not Specified)   Family History  Problem Relation Age of Onset   Hypertension Father    Diabetes Father    Hyperlipidemia Father    Breast cancer Mother 92   Cancer Mother        breast   Thyroid disease Mother    Sleep apnea Mother    Colon cancer Paternal Grandfather        died at age 36   Cancer Paternal Grandfather        GI CA   Obesity Sister    Diabetes Sister    Hypertension Sister    Heart disease Maternal Grandmother        MI   Cancer Maternal Grandfather        lung, smoker   Diabetes Maternal Grandfather    Diabetes Paternal Grandmother    Heart disease Paternal Grandmother    Pancreatitis Sister    Esophageal cancer Neg Hx    Stomach cancer Neg Hx    Stroke Neg Hx    Colon polyps Neg Hx    Rectal cancer Neg Hx    Pancreatic cancer Neg Hx    Allergies  Allergen Reactions   Coconut Fatty Acids Hives   Codeine Hives and Itching   Mango Flavor Hives   [name] Itching and Swelling   Oxycodone-Acetaminophen     rash on face and head, itching   Tussionex Pennkinetic Er [Hydrocod Poli-Chlorphe Poli Er]      rash on face and head, itching   Ultram [Tramadol] Other (See Comments)  Dizzy and low blood pressure.       Review of Systems  Constitutional:  Negative for fever and malaise/fatigue.  HENT:  Negative for congestion.   Eyes:  Negative for blurred vision.  Respiratory:  Negative for shortness of breath.   Cardiovascular:  Negative for chest pain, palpitations and leg swelling.  Gastrointestinal:  Negative for abdominal pain, blood in stool and nausea.  Genitourinary:  Negative for dysuria and frequency.  Musculoskeletal:  Negative for falls.  Skin:  Positive for itching and rash.  Neurological:  Negative for dizziness, loss of consciousness and headaches.  Endo/Heme/Allergies:  Negative for environmental allergies.  Psychiatric/Behavioral:  Negative for depression. The patient is not nervous/anxious.       Objective:     BP 130/84 (BP Location: Left Arm, Patient Position: Sitting, Cuff Size: Large)   Pulse 67   Temp 98.1 F (36.7 C) (Oral)   Resp 18   Ht '5\' 5"'$  (1.651 m)   Wt 235 lb 12.8 oz (107 kg)   LMP 11/22/2018 (Approximate)   SpO2 96%   BMI 39.24 kg/m  BP Readings from Last 3 Encounters:  08/29/22 130/84  04/25/22 134/74  04/12/22 128/86   Wt Readings from Last 3 Encounters:  08/29/22 235 lb 12.8 oz (107 kg)  04/25/22 245 lb (111.1 kg)  04/25/22 245 lb 12.8 oz (111.5 kg)   SpO2 Readings from Last 3 Encounters:  08/29/22 96%  08/03/21 95%  07/27/21 96%      Physical Exam Vitals and nursing note reviewed.  Constitutional:      General: She is not in acute distress.    Appearance: She is obese. She is not ill-appearing.  Pulmonary:     Effort: Pulmonary effort is normal.  Skin:    Findings: Rash present. Rash is crusting and vesicular.     Comments: Rash on both arms, abd , back and legs she also has some on her forehead   Neurological:     Mental Status: She is alert.      No results found for any visits on 08/29/22.  Last CBC Lab  Results  Component Value Date   WBC 7.5 04/25/2022   HGB 12.9 04/25/2022   HCT 39.7 04/25/2022   MCV 88.0 04/25/2022   MCH 29.8 06/07/2021   RDW 13.5 04/25/2022   PLT 337.0 92/33/0076   Last metabolic panel Lab Results  Component Value Date   GLUCOSE 84 04/25/2022   NA 139 04/25/2022   K 4.4 04/25/2022   CL 104 04/25/2022   CO2 28 04/25/2022   BUN 13 04/25/2022   CREATININE 0.58 04/25/2022   GFRNONAA 108 01/24/2021   CALCIUM 8.9 04/25/2022   PROT 6.9 04/25/2022   ALBUMIN 4.2 04/25/2022   LABGLOB 2.5 01/24/2021   AGRATIO 1.7 01/24/2021   BILITOT 0.3 04/25/2022   ALKPHOS 142 (H) 04/25/2022   AST 17 04/25/2022   ALT 20 04/25/2022   ANIONGAP 8 10/08/2019   Last lipids Lab Results  Component Value Date   CHOL 187 04/25/2022   HDL 47.00 04/25/2022   LDLCALC 116 (H) 05/14/2021   LDLDIRECT 113.0 04/25/2022   TRIG 303.0 (H) 04/25/2022   CHOLHDL 4 04/25/2022   Last hemoglobin A1c Lab Results  Component Value Date   HGBA1C 5.6 04/25/2022   Last thyroid functions Lab Results  Component Value Date   TSH 1.83 04/25/2022   T3TOTAL 124 10/06/2017   T4TOTAL 5.9 01/24/2021   Last vitamin D Lab Results  Component Value Date  VD25OH 33.65 04/25/2022   Last vitamin B12 and Folate Lab Results  Component Value Date   VITAMINB12 678 10/06/2017   FOLATE 15.9 10/06/2017      The 10-year ASCVD risk score (Arnett DK, et al., 2019) is: 2.7%    Assessment & Plan:   Problem List Items Addressed This Visit       Unprioritized   Class 3 severe obesity without serious comorbidity with body mass index (BMI) of 40.0 to 44.9 in adult ([place])    Restart wegovy F/u pcp       Relevant Medications   Semaglutide-Weight Management (WEGOVY) 0.25 MG/0.5ML SOAJ   Allergic contact dermatitis due to plants, except food - Primary    Depo medrol 80 mg I'm pred taper  Benadryl prn       Relevant Medications   predniSONE (DELTASONE) 10 MG tablet    No follow-ups on file.     Ann Held, DO

## 2022-08-29 NOTE — Assessment & Plan Note (Signed)
Restart wegovy F/u pcp

## 2022-08-29 NOTE — Assessment & Plan Note (Signed)
Depo medrol 80 mg I'm pred taper  Benadryl prn

## 2022-09-04 ENCOUNTER — Other Ambulatory Visit (HOSPITAL_BASED_OUTPATIENT_CLINIC_OR_DEPARTMENT_OTHER): Payer: Self-pay

## 2022-09-05 ENCOUNTER — Encounter: Payer: 59 | Admitting: Family Medicine

## 2022-09-09 DIAGNOSIS — G4733 Obstructive sleep apnea (adult) (pediatric): Secondary | ICD-10-CM | POA: Diagnosis not present

## 2022-09-10 ENCOUNTER — Other Ambulatory Visit: Payer: Self-pay | Admitting: Family Medicine

## 2022-09-10 ENCOUNTER — Encounter: Payer: Self-pay | Admitting: Family Medicine

## 2022-09-10 MED ORDER — EPINEPHRINE 0.3 MG/0.3ML IJ SOAJ
0.3000 mg | INTRAMUSCULAR | 1 refills | Status: DC | PRN
  Filled 2022-09-10: qty 2, 30d supply, fill #0
  Filled 2023-09-06: qty 2, 30d supply, fill #1

## 2022-09-11 ENCOUNTER — Other Ambulatory Visit (HOSPITAL_BASED_OUTPATIENT_CLINIC_OR_DEPARTMENT_OTHER): Payer: Self-pay

## 2022-09-11 ENCOUNTER — Encounter (HOSPITAL_BASED_OUTPATIENT_CLINIC_OR_DEPARTMENT_OTHER): Payer: Self-pay

## 2022-09-26 ENCOUNTER — Other Ambulatory Visit: Payer: Self-pay | Admitting: Family Medicine

## 2022-09-26 ENCOUNTER — Other Ambulatory Visit (HOSPITAL_BASED_OUTPATIENT_CLINIC_OR_DEPARTMENT_OTHER): Payer: Self-pay

## 2022-09-26 DIAGNOSIS — F418 Other specified anxiety disorders: Secondary | ICD-10-CM

## 2022-09-26 MED ORDER — ESCITALOPRAM OXALATE 10 MG PO TABS
10.0000 mg | ORAL_TABLET | Freq: Every day | ORAL | 0 refills | Status: DC
  Filled 2022-10-07: qty 90, 90d supply, fill #0

## 2022-09-26 MED ORDER — WEGOVY 0.5 MG/0.5ML ~~LOC~~ SOAJ
0.5000 mg | SUBCUTANEOUS | 0 refills | Status: DC
  Filled 2022-09-26 – 2022-10-08 (×2): qty 2, 28d supply, fill #0

## 2022-09-26 MED ORDER — ESCITALOPRAM OXALATE 5 MG PO TABS
5.0000 mg | ORAL_TABLET | Freq: Every day | ORAL | 0 refills | Status: DC
  Filled 2022-09-26: qty 90, 90d supply, fill #0

## 2022-10-07 ENCOUNTER — Other Ambulatory Visit (HOSPITAL_BASED_OUTPATIENT_CLINIC_OR_DEPARTMENT_OTHER): Payer: Self-pay

## 2022-10-08 ENCOUNTER — Other Ambulatory Visit (HOSPITAL_BASED_OUTPATIENT_CLINIC_OR_DEPARTMENT_OTHER): Payer: Self-pay

## 2022-10-28 ENCOUNTER — Other Ambulatory Visit: Payer: Self-pay | Admitting: Family Medicine

## 2022-10-28 ENCOUNTER — Other Ambulatory Visit (HOSPITAL_BASED_OUTPATIENT_CLINIC_OR_DEPARTMENT_OTHER): Payer: Self-pay

## 2022-10-28 MED ORDER — WEGOVY 1 MG/0.5ML ~~LOC~~ SOAJ
1.0000 mg | SUBCUTANEOUS | 1 refills | Status: DC
  Filled 2022-10-28: qty 2, 28d supply, fill #0

## 2022-10-29 ENCOUNTER — Other Ambulatory Visit (HOSPITAL_BASED_OUTPATIENT_CLINIC_OR_DEPARTMENT_OTHER): Payer: Self-pay

## 2022-11-04 ENCOUNTER — Other Ambulatory Visit (HOSPITAL_BASED_OUTPATIENT_CLINIC_OR_DEPARTMENT_OTHER): Payer: Self-pay

## 2022-11-04 ENCOUNTER — Encounter: Payer: Self-pay | Admitting: Family Medicine

## 2022-11-04 ENCOUNTER — Ambulatory Visit: Payer: 59 | Admitting: Family Medicine

## 2022-11-04 VITALS — BP 140/66 | HR 83 | Temp 98.2°F | Ht 65.0 in | Wt 240.6 lb

## 2022-11-04 DIAGNOSIS — L089 Local infection of the skin and subcutaneous tissue, unspecified: Secondary | ICD-10-CM

## 2022-11-04 MED ORDER — AMOXICILLIN-POT CLAVULANATE 875-125 MG PO TABS
1.0000 | ORAL_TABLET | Freq: Two times a day (BID) | ORAL | 0 refills | Status: DC
  Filled 2022-11-04: qty 14, 7d supply, fill #0

## 2022-11-04 NOTE — Progress Notes (Signed)
Acute Office Visit  Subjective:     Patient ID: Hailey Noble, female    DOB: September 15, 1966, 56 y.o.   MRN: [id number]  Chief Complaint  Patient presents with   Insect Bite    Left eye lid is swelling but the eye brow is what hurts. Started yesterday afternoon.     HPI Patient is in today for eye irritation.   Patient reports small painful bump to left eyelid that her husband first noticed yesterday afternoon. Reports it was a slightly red and swollen, but today it has spread into her upper lid. States the bump on her eyebrow is the only painful area and it does feel firm under the skin. Otherwise she wouldn't even notice it. She denies any itching, severe pain, vision changes, drainage, fevers, chills. Reports she had her eyebrows done, but that was several weeks ago for her wedding. No symptoms until yesterday. She has not tried anything on the area yet.    ROS All review of systems negative except what is listed in the HPI      Objective:    BP (!) 140/66   Pulse 83   Temp 98.2 F (36.8 C) (Oral)   Ht '5\' 5"'$  (1.651 m)   Wt 240 lb 9.3 oz (109.1 kg)   LMP 11/22/2018 (Approximate)   SpO2 97%   BMI 40.03 kg/m    Physical Exam Vitals reviewed.  Constitutional:      Appearance: Normal appearance.  Eyes:     Extraocular Movements: Extraocular movements intact.     Conjunctiva/sclera: Conjunctivae normal.     Pupils: Pupils are equal, round, and reactive to light.     Comments: Slight induration at lesion of left eyebrow with surrounding edema and erythema, no warmth or drainage  Neurological:     General: No focal deficit present.     Mental Status: She is alert and oriented to person, place, and time. Mental status is at baseline.  Psychiatric:        Mood and Affect: Mood normal.        Behavior: Behavior normal.        Thought Content: Thought content normal.        Judgment: Judgment normal.        No results found for any visits on 11/04/22.       Assessment & Plan:   Problem List Items Addressed This Visit   None Visit Diagnoses     Local skin infection    -  Primary Discussed with Dr. Lorelei Pont for additional input.  Likely related to the area of tenderness at the eyebrow (possibly ingrown hair from recent plucking). Given that it is so close to the eye, we will be aggressive and treat with antibiotics. If not starting to notice some improvement in the next day or so, or if significantly worse, let us know and we can order a CT to check for orbital cellulitis.    Relevant Medications   amoxicillin-clavulanate (AUGMENTIN) 875-125 MG tablet       Meds ordered this encounter  Medications   amoxicillin-clavulanate (AUGMENTIN) 875-125 MG tablet    Sig: Take 1 tablet by mouth 2 (two) times daily for 7 days.    Dispense:  14 tablet    Refill:  0    Order Specific Question:   Supervising Provider    Answer:   Penni Homans A [[id number]]    Return if symptoms worsen or fail to improve.  Terrilyn Saver,  NP

## 2022-11-04 NOTE — Patient Instructions (Signed)
Likely related to the area of tenderness at the eyebrow (possibly ingrown hair from recent plucking). Given that it is so close to the eye, we will be aggressive and treat with antibiotics. If not starting to notice some improvement in the next day or so, or if significantly worse, let us know and we can order a CT to check for orbital cellulitis.

## 2022-11-10 NOTE — Assessment & Plan Note (Signed)
hgba1c acceptable, minimize simple carbs. Increase exercise as tolerated.  

## 2022-11-10 NOTE — Assessment & Plan Note (Signed)
Encourage heart healthy diet such as MIND or DASH diet, increase exercise, avoid trans fats, simple carbohydrates and processed foods, consider a krill or fish or flaxseed oil cap daily.  °

## 2022-11-10 NOTE — Assessment & Plan Note (Signed)
Patient encouraged to maintain heart healthy diet, regular exercise, adequate sleep. Consider daily probiotics. Take medications as prescribed  Labs ordered and reviewed  Colonoscopy 2022 repeat in 2027 MGM repeat after December 2023 Pap Covid and flu boosters

## 2022-11-10 NOTE — Assessment & Plan Note (Signed)
Supplement and monitor 

## 2022-11-11 ENCOUNTER — Ambulatory Visit (INDEPENDENT_AMBULATORY_CARE_PROVIDER_SITE_OTHER): Payer: 59 | Admitting: Family Medicine

## 2022-11-11 ENCOUNTER — Ambulatory Visit (HOSPITAL_BASED_OUTPATIENT_CLINIC_OR_DEPARTMENT_OTHER)
Admission: RE | Admit: 2022-11-11 | Discharge: 2022-11-11 | Disposition: A | Discharge status: Home / Self Care | Payer: 59 | Source: Ambulatory Visit | Attending: Family Medicine | Admitting: Family Medicine

## 2022-11-11 VITALS — BP 150/64 | HR 79 | Temp 97.8°F | Resp 16 | Ht 64.5 in | Wt 240.0 lb

## 2022-11-11 DIAGNOSIS — R739 Hyperglycemia, unspecified: Secondary | ICD-10-CM | POA: Diagnosis not present

## 2022-11-11 DIAGNOSIS — E559 Vitamin D deficiency, unspecified: Secondary | ICD-10-CM | POA: Diagnosis not present

## 2022-11-11 DIAGNOSIS — Z6841 Body Mass Index (BMI) 40.0 and over, adult: Secondary | ICD-10-CM

## 2022-11-11 DIAGNOSIS — M545 Low back pain, unspecified: Secondary | ICD-10-CM | POA: Insufficient documentation

## 2022-11-11 DIAGNOSIS — F418 Other specified anxiety disorders: Secondary | ICD-10-CM | POA: Diagnosis not present

## 2022-11-11 DIAGNOSIS — Z0000 Encounter for general adult medical examination without abnormal findings: Secondary | ICD-10-CM

## 2022-11-11 DIAGNOSIS — E7849 Other hyperlipidemia: Secondary | ICD-10-CM | POA: Diagnosis not present

## 2022-11-11 DIAGNOSIS — R748 Abnormal levels of other serum enzymes: Secondary | ICD-10-CM | POA: Insufficient documentation

## 2022-11-11 DIAGNOSIS — L578 Other skin changes due to chronic exposure to nonionizing radiation: Secondary | ICD-10-CM | POA: Insufficient documentation

## 2022-11-11 DIAGNOSIS — W19XXXA Unspecified fall, initial encounter: Secondary | ICD-10-CM | POA: Diagnosis not present

## 2022-11-11 DIAGNOSIS — E785 Hyperlipidemia, unspecified: Secondary | ICD-10-CM | POA: Diagnosis not present

## 2022-11-11 DIAGNOSIS — M533 Sacrococcygeal disorders, not elsewhere classified: Secondary | ICD-10-CM | POA: Diagnosis not present

## 2022-11-11 NOTE — Patient Instructions (Addendum)
Yer[place] Matte tea do not drink   Preventive Care 40-57 Years Old, Female Preventive care refers to lifestyle choices and visits with your health care provider that can promote health and wellness. Preventive care visits are also called wellness exams. What can I expect for my preventive care visit? Counseling Your health care provider may ask you questions about your: Medical history, including: Past medical problems. Family medical history. Pregnancy history. Current health, including: Menstrual cycle. Method of birth control. Emotional well-being. Home life and relationship well-being. Sexual activity and sexual health. Lifestyle, including: Alcohol, nicotine or tobacco, and drug use. Access to firearms. Diet, exercise, and sleep habits. Work and work environment. Sunscreen use. Safety issues such as seatbelt and bike helmet use. Physical exam Your health care provider will check your: Height and weight. These may be used to calculate your BMI (body mass index). BMI is a measurement that tells if you are at a healthy weight. Waist circumference. This measures the distance around your waistline. This measurement also tells if you are at a healthy weight and may help predict your risk of certain diseases, such as type 2 diabetes and high blood pressure. Heart rate and blood pressure. Body temperature. Skin for abnormal spots. What immunizations do I need?  Vaccines are usually given at various ages, according to a schedule. Your health care provider will recommend vaccines for you based on your age, medical history, and lifestyle or other factors, such as travel or where you work. What tests do I need? Screening Your health care provider may recommend screening tests for certain conditions. This may include: Lipid and cholesterol levels. Diabetes screening. This is done by checking your blood sugar (glucose) after you have not eaten for a while (fasting). Pelvic exam and Pap  test. Hepatitis B test. Hepatitis C test. HIV (human immunodeficiency virus) test. STI (sexually transmitted infection) testing, if you are at risk. Lung cancer screening. Colorectal cancer screening. Mammogram. Talk with your health care provider about when you should start having regular mammograms. This may depend on whether you have a family history of breast cancer. BRCA-related cancer screening. This may be done if you have a family history of breast, ovarian, tubal, or peritoneal cancers. Bone density scan. This is done to screen for osteoporosis. Talk with your health care provider about your test results, treatment options, and if necessary, the need for more tests. Follow these instructions at home: Eating and drinking  Eat a diet that includes fresh fruits and vegetables, whole grains, lean protein, and low-fat dairy products. Take vitamin and mineral supplements as recommended by your health care provider. Do not drink alcohol if: Your health care provider tells you not to drink. You are pregnant, may be pregnant, or are planning to become pregnant. If you drink alcohol: Limit how much you have to 0-1 drink a day. Know how much alcohol is in your drink. In the [place] one drink equals one 12 oz bottle of beer (355 mL), one 5 oz glass of wine (148 mL), or one 1 oz glass of hard liquor (44 mL). Lifestyle Brush your teeth every morning and night with fluoride toothpaste. Floss one time each day. Exercise for at least 30 minutes 5 or more days each week. Do not use any products that contain nicotine or tobacco. These products include cigarettes, chewing tobacco, and vaping devices, such as e-cigarettes. If you need help quitting, ask your health care provider. Do not use drugs. If you are sexually active, practice safe sex. Use   condom or other form of protection to prevent STIs. If you do not wish to become pregnant, use a form of birth control. If you plan to become pregnant,  see your health care provider for a prepregnancy visit. Take aspirin only as told by your health care provider. Make sure that you understand how much to take and what form to take. Work with your health care provider to find out whether it is safe and beneficial for you to take aspirin daily. Find healthy ways to manage stress, such as: Meditation, yoga, or listening to music. Journaling. Talking to a trusted person. Spending time with friends and family. Minimize exposure to UV radiation to reduce your risk of skin cancer. Safety Always wear your seat belt while driving or riding in a vehicle. Do not drive: If you have been drinking alcohol. Do not ride with someone who has been drinking. When you are tired or distracted. While texting. If you have been using any mind-altering substances or drugs. Wear a helmet and other protective equipment during sports activities. If you have firearms in your house, make sure you follow all gun safety procedures. Seek help if you have been physically or sexually abused. What's next? Visit your health care provider once a year for an annual wellness visit. Ask your health care provider how often you should have your eyes and teeth checked. Stay up to date on all vaccines. This information is not intended to replace advice given to you by your health care provider. Make sure you discuss any questions you have with your health care provider. Document Revised: 06/06/2021 Document Reviewed: 06/06/2021 Elsevier Patient Education  [place].

## 2022-11-11 NOTE — Assessment & Plan Note (Addendum)
Wellbutrin 75 mg did not help but Lexapro has been helpful at 10 mg daily. Stop the Wellbutrin.

## 2022-11-11 NOTE — Assessment & Plan Note (Signed)
Encouraged DASH or MIND diet, decrease po intake and increase exercise as tolerated. Needs 7-8 hours of sleep nightly. Avoid trans fats, eat small, frequent meals every 4-5 hours with lean proteins, complex carbs and healthy fats. Minimize simple carbs, high fat foods and processed foods. Notes the [facility] did not suppress her appetite until she hit one mg. Now she is doing well. If appetite suppression fails can increase dosing further.

## 2022-11-11 NOTE — Assessment & Plan Note (Signed)
She suffered a fall while hiking in [place] in October and while her low back pain is improving it is still present. Will check xray and Encouraged moist heat and gentle stretching as tolerated. May try NSAIDs and prescription meds as directed and report if symptoms worsen or seek immediate care

## 2022-11-11 NOTE — Assessment & Plan Note (Signed)
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 Check a xray

## 2022-11-11 NOTE — Assessment & Plan Note (Signed)
Referred to dermatology for further evaluation.

## 2022-11-11 NOTE — Progress Notes (Signed)
Subjective:   By signing my name below, I, Hailey Noble, attest that this documentation has been prepared under the direction and in the presence of Willette Alma MD, 11/11/2022   Patient ID: Hailey Noble, female    DOB: [birth year], 56 y.o.   MRN: [id number]  Chief Complaint  Patient presents with   Annual Exam         HPI Patient is in today for a comprehensive physical exam   Sugar Craving She is currently taking 75 mg of Wellbutrin and does not see a difference in her sugar cravings. She noticed a difference in cravings when she was taking 150 mg of Wellbutrin  Appetite She reports that she has increased to 1 mg of Wegovy on 11/07/2022 and noticed a difference in appetite cravings. She notes of some burning sensation in her abdominal area. She is currently taking Protonix for her symptoms. She is scheduled to follow up with her GI specialist, Dr.Gupta, on 11/12/2022 Wt Readings from Last 3 Encounters:  11/11/22 240 lb (108.9 kg)  11/04/22 240 lb 9.3 oz (109.1 kg)  08/29/22 235 lb 12.8 oz (107 kg)   Mood She states that she previously went to [employer] due to worsening mood. She was previously taking 10 mg of Lexapro and an additional 5 mg of the medication but since the appointments, is requesting to discontinue the 5 mg of the medication.   Sleep She reports that she has not taken the Hydroxyzine medication for sleep  Fall She reports of a fall on her knees a few months ago and noticed a bump on the left area above her knee and an additional bump above her right knee. She reports an additional fall during her time in [place] on her buttock on 10/20/2022. She states that the abrasion has resolved. However, if she presses the area, she experiences some tingling. She denies of any GI concerns.   Dermatology She is interested in being referred to a dermatologist  Ingrown Eyebrow She reports that she was seen by Olevia Bowens NP for an ingrown eyebrow due to recent plucking. She  has been taking 875-125 mg of Amoxicillin  She denies having any fever, new muscle pain, joint pain, hearing or vision symptoms, new moles, congestion, sinus pain, sore throat, chest pain, palpations, cough, SOB ,wheezing,n/v/d constipation, blood in stool, dysuria, frequency, hematuria, at this time  She denies of any changes to her family medical history Colonoscopy was last completed on 06/14/2021 Mammogram was last completed on 12/19/2021 10/12/2019 She reports of a total hysterectomy on 10/12/2019. Her last completed pelvis exam was 02/2022 She reports an influenza vaccine on 09/2022. She has not received the updated Covid vaccine.   Past Medical History:  Diagnosis Date   Allergic state 12/08/2016   Allergy    Anxiety    Back pain    " i just have a weak loer back"    Fibroids    GERD (gastroesophageal reflux disease)    Hyperlipidemia 06/09/2017   Insulin resistance    Joint pain    Knee pain, right 03/10/2017   Plantar fasciitis    PONV (postoperative nausea and vomiting)    just nausea    Shortness of breath    not only if  i exert myselg    Sinusitis    Sleep apnea    Sleep apnea in adult 12/26/2020   Vitamin D deficiency 11/22/2016   Vitamin D deficiency     Past Surgical History:  Procedure Laterality  Date   ABDOMINAL HYSTERECTOMY  09/2019   [place]   COLONOSCOPY  02-2009   with Henrene Pastor normal   CYSTOSCOPY N/A 10/12/2019   Procedure: CYSTOSCOPY;  Surgeon: Sherlyn Hay, DO;  Location: [facility];  Service: Gynecology;  Laterality: N/A;   ESOPHAGOGASTRODUODENOSCOPY  2012   GANGLION CYST EXCISION Right    right- wrist    NASAL SEPTUM SURGERY     NASAL SINUS SURGERY     TMJ ARTHROSCOPY     left   TOTAL LAPAROSCOPIC HYSTERECTOMY WITH BILATERAL SALPINGO OOPHORECTOMY Bilateral 10/12/2019   Procedure: TOTAL LAPAROSCOPIC HYSTERECTOMY WITH BILATERAL SALPINGO OOPHORECTOMY, Lysis Pelvic Adhseions ;  Surgeon: Sherlyn Hay, DO;   Location: [facility];  Service: Gynecology;  Laterality: Bilateral;    Family History  Problem Relation Age of Onset   Hypertension Father    Diabetes Father    Hyperlipidemia Father    Breast cancer Mother 73   Cancer Mother        breast   Thyroid disease Mother    Sleep apnea Mother    Colon cancer Paternal Grandfather        died at age 17   Cancer Paternal Grandfather        GI CA   Obesity Sister    Diabetes Sister    Hypertension Sister    Heart disease Maternal Grandmother        MI   Cancer Maternal Grandfather        lung, smoker   Diabetes Maternal Grandfather    Diabetes Paternal Grandmother    Heart disease Paternal Grandmother    Pancreatitis Sister    Esophageal cancer Neg Hx    Stomach cancer Neg Hx    Stroke Neg Hx    Colon polyps Neg Hx    Rectal cancer Neg Hx    Pancreatic cancer Neg Hx     Social History   Socioeconomic History   Marital status: Married    Spouse name: Not on file   Number of children: Not on file   Years of education: Not on file   Highest education level: Not on file  Occupational History   Occupation: Programmer, multimedia: [employer]  Tobacco Use   Smoking status: Never   Smokeless tobacco: Never  Vaping Use   Vaping Use: Never used  Substance and Sexual Activity   Alcohol use: Yes    Alcohol/week: 0.0 standard drinks of alcohol    Comment: rare   Drug use: No   Sexual activity: Yes    Birth control/protection: I.U.D.  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 Works at [employer]. No dietary restrictions. Lives with dog   Social Determinants of Health   Financial Resource Strain: Not on file  Food Insecurity: Not on file  Transportation Needs: Not on file  Physical Activity: Not on file  Stress: Not on file  Social Connections: Not on file  Intimate Partner Violence: Not on file    Outpatient  Medications Prior to Visit  Medication Sig Dispense Refill   acetaminophen (TYLENOL) 500 MG tablet Take 500 mg by mouth every 6 (six) hours as needed for headache.     Calcium Carbonate-Vitamin D (CALTRATE 600+D PO) Take 1,200 mg by mouth daily.     EPINEPHrine (EPIPEN 2-PAK) 0.3 mg/0.3 mL IJ SOAJ injection Inject 0.3  mg into the muscle as needed for anaphylaxis (repeat in 15 minutes if symptoms return seek care). 2 each 1   escitalopram (LEXAPRO) 10 MG tablet Take 1 tablet (10 mg total) by mouth daily. 90 tablet 0   fluticasone (FLONASE) 50 MCG/ACT nasal spray Place 2 sprays into both nostrils daily. 16 g 5   meloxicam (MOBIC) 15 MG tablet Take 1 tablet (15 mg total) by mouth daily. 45 tablet 1   metFORMIN (GLUCOPHAGE-XR) 500 MG 24 hr tablet Take 1 tablet (500 mg total) by mouth daily with breakfast. 90 tablet 1   pantoprazole (PROTONIX) 40 MG tablet Take 1 tablet (40 mg total) by mouth daily. 90 tablet 3   Polyethyl Glycol-Propyl Glycol (SYSTANE) 0.4-0.3 % SOLN Place 1 drop into both eyes daily as needed (Allergies).     Polyethylene Glycol 3350 (MIRALAX PO) Take by mouth.     Semaglutide-Weight Management (WEGOVY) 1 MG/0.5ML SOAJ Inject 1 mg into the skin once a week. 2 mL 1   ursodiol (URSO FORTE) 500 MG tablet Take 1 tablet (500 mg total) by mouth in the morning and at bedtime. Please call [phone] to call to schedule an office visit for more refills 60 tablet 6   Vitamin D, Cholecalciferol, 25 MCG (1000 UT) TABS Take 4,000 units daily 60 tablet    buPROPion (WELLBUTRIN) 75 MG tablet TAKE 1 TABLET BY MOUTH ONCE DAILY 90 tablet 1   amoxicillin-clavulanate (AUGMENTIN) 875-125 MG tablet Take 1 tablet by mouth 2 (two) times daily for 7 days. 14 tablet 0   escitalopram (LEXAPRO) 5 MG tablet Take 1 tablet (5 mg total) by mouth daily. Take with the Lexapro 10 mg to total 15 mg daily 90 tablet 0   hydrOXYzine (VISTARIL) 25 MG capsule Take 1 capsule (25 mg total) by mouth every 8 (eight) hours as  needed for anxiety. 30 capsule 0   Semaglutide-Weight Management (WEGOVY) 0.5 MG/0.5ML SOAJ Inject 0.5 mg into the skin once a week. 2 mL 0   No facility-administered medications prior to visit.    Allergies  Allergen Reactions   Coconut Fatty Acids Hives   Codeine Hives and Itching   Mango Flavor Hives   [name] Itching and Swelling   Oxycodone-Acetaminophen     rash on face and head, itching   Tussionex Pennkinetic Er [Hydrocod Poli-Chlorphe Poli Er]     rash on face and head, itching   Ultram [Tramadol] Other (See Comments)    Dizzy and low blood pressure.     Review of Systems  Constitutional:  Negative for fever.  HENT:  Negative for congestion, hearing loss, sinus pain and sore throat.   Eyes:  Negative for blurred vision.  Respiratory:  Negative for cough, shortness of breath and wheezing.   Cardiovascular:  Negative for chest pain and palpitations.  Gastrointestinal:  Negative for blood in stool, constipation, diarrhea, nausea and vomiting.  Genitourinary:  Negative for dysuria, frequency and hematuria.  Musculoskeletal:  Negative for joint pain and myalgias.  Skin:        (-) [place]  Neurological:  Positive for tingling ([place]).       Objective:    Physical Exam Constitutional:      General: She is not in acute distress.    Appearance: Normal appearance. She is not ill-appearing.  HENT:     Head: Normocephalic and atraumatic.     Right Ear: Tympanic membrane, ear canal and external ear normal.     Left Ear: Tympanic membrane, ear  canal and external ear normal.  Eyes:     Extraocular Movements: Extraocular movements intact.     Conjunctiva/sclera: Conjunctivae normal.     Pupils: Pupils are equal, round, and reactive to light.  Cardiovascular:     Rate and Rhythm: Normal rate and regular rhythm.     Heart sounds: Normal heart sounds. No murmur heard.    No gallop.  Pulmonary:     Effort: Pulmonary effort is normal. No respiratory distress.      Breath sounds: Normal breath sounds. No wheezing or rales.  Abdominal:     General: Bowel sounds are normal. There is no distension.     Palpations: Abdomen is soft.     Tenderness: There is no abdominal tenderness. There is no guarding.  Musculoskeletal:     Right lower leg: No edema.     Left lower leg: No edema.  Skin:    General: Skin is warm and dry.     Findings: No erythema.     Comments: Mobile circular about 1 cm under skin of left upper eyebrow.  Non tender, non hot, and non erythema   Neurological:     Mental Status: She is alert and oriented to person, place, and time.     Cranial Nerves: No cranial nerve deficit.     Coordination: Coordination normal.  Psychiatric:        Judgment: Judgment normal.     BP (!) 150/64 (BP Location: Right Arm, Patient Position: Sitting, Cuff Size: Large)   Pulse 79   Temp 97.8 F (36.6 C) (Oral)   Resp 16   Ht 5' 4.5" (1.638 m)   Wt 240 lb (108.9 kg)   LMP 11/22/2018 (Approximate)   SpO2 97%   BMI 40.56 kg/m  Wt Readings from Last 3 Encounters:  11/11/22 240 lb (108.9 kg)  11/04/22 240 lb 9.3 oz (109.1 kg)  08/29/22 235 lb 12.8 oz (107 kg)    Diabetic Foot Exam - Simple   No data filed    Lab Results  Component Value Date   WBC 7.5 04/25/2022   HGB 12.9 04/25/2022   HCT 39.7 04/25/2022   PLT 337.0 04/25/2022   GLUCOSE 84 04/25/2022   CHOL 187 04/25/2022   TRIG 303.0 (H) 04/25/2022   HDL 47.00 04/25/2022   LDLDIRECT 113.0 04/25/2022   LDLCALC 116 (H) 05/14/2021   ALT 20 04/25/2022   AST 17 04/25/2022   NA 139 04/25/2022   K 4.4 04/25/2022   CL 104 04/25/2022   CREATININE 0.58 04/25/2022   BUN 13 04/25/2022   CO2 28 04/25/2022   TSH 1.83 04/25/2022   INR 1.0 06/07/2021   HGBA1C 5.6 04/25/2022    Lab Results  Component Value Date   TSH 1.83 04/25/2022   Lab Results  Component Value Date   WBC 7.5 04/25/2022   HGB 12.9 04/25/2022   HCT 39.7 04/25/2022   MCV 88.0 04/25/2022   PLT 337.0 04/25/2022    Lab Results  Component Value Date   NA 139 04/25/2022   K 4.4 04/25/2022   CO2 28 04/25/2022   GLUCOSE 84 04/25/2022   BUN 13 04/25/2022   CREATININE 0.58 04/25/2022   BILITOT 0.3 04/25/2022   ALKPHOS 142 (H) 04/25/2022   AST 17 04/25/2022   ALT 20 04/25/2022   PROT 6.9 04/25/2022   ALBUMIN 4.2 04/25/2022   CALCIUM 8.9 04/25/2022   ANIONGAP 8 10/08/2019   GFR 100.77 04/25/2022   Lab Results  Component Value  Date   CHOL 187 04/25/2022   Lab Results  Component Value Date   HDL 47.00 04/25/2022   Lab Results  Component Value Date   LDLCALC 116 (H) 05/14/2021   Lab Results  Component Value Date   TRIG 303.0 (H) 04/25/2022   Lab Results  Component Value Date   CHOLHDL 4 04/25/2022   Lab Results  Component Value Date   HGBA1C 5.6 04/25/2022       Assessment & Plan:   Problem List Items Addressed This Visit     Preventative health care    Patient encouraged to maintain heart healthy diet, regular exercise, adequate sleep. Consider daily probiotics. Take medications as prescribed  Labs ordered and reviewed  Colonoscopy 2022 repeat in 2027 MGM repeat after December 2023 Pap at [facility], sees Jefm Miles, NP, had a TAH 09/2019 now no longer does paps just pelvics in March of 2023 Covid and flu boosters      Depression with anxiety    Wellbutrin 75 mg did not help but Lexapro has been helpful at 10 mg daily. Stop the Wellbutrin.       Vitamin D deficiency    Supplement and monitor       Relevant Orders   CBC   VITAMIN D 25 Hydroxy (Vit-D Deficiency, Fractures)   Hyperlipidemia    Encourage heart healthy diet such as MIND or DASH diet, increase exercise, avoid trans fats, simple carbohydrates and processed foods, consider a krill or fish or flaxseed oil cap daily.        Relevant Orders   TSH   Lipid panel   Hyperglycemia - Primary    hgba1c acceptable, minimize simple carbs. Increase exercise as tolerated.       Relevant Orders   CBC    Comprehensive metabolic panel   TSH   Hemoglobin A1c   Class 3 severe obesity without serious comorbidity with body mass index (BMI) of 40.0 to 44.9 in adult [facility])    Encouraged DASH or MIND diet, decrease po intake and increase exercise as tolerated. Needs 7-8 hours of sleep nightly. Avoid trans fats, eat small, frequent meals every 4-5 hours with lean proteins, complex carbs and healthy fats. Minimize simple carbs, high fat foods and processed foods. Notes the [facility] did not suppress her appetite until she hit one mg. Now she is doing well. If appetite suppression fails can increase dosing further.       Falls    Fell in July on her knees and now has calcium deposits on both tibial plateaus proximally, reassured it will resolve Second fall 10/20/2022 on sacrum with abrasion, which is healed but still osre in the area. Check a xray      Relevant Orders   CBC   Sun-damaged skin    Referred to dermatology for further evaluation      Relevant Orders   Ambulatory referral to Dermatology   Elevated alkaline phosphatase level   Relevant Orders   Alkaline phosphatase, isoenzymes   Low back pain    She suffered a fall while hiking in [place] in October and while her low back pain is improving it is still present. Will check xray and Encouraged moist heat and gentle stretching as tolerated. May try NSAIDs and prescription meds as directed and report if symptoms worsen or seek immediate care       Relevant Orders   DG Sacrum/Coccyx   No orders of the defined types were placed in this encounter.  I, Penni Homans, MD, personally preformed the services described in this documentation.  All medical record entries made by the scribe were at my direction and in my presence.  I have reviewed the chart and discharge instructions (if applicable) and agree that the record reflects my personal performance and is accurate and complete. 11/11/2022   I,Amber Collins,acting as a scribe for Penni Homans,  MD.,have documented all relevant documentation on the behalf of Shanasia Ibrahim, MD,as directed by  Penni Homans, MD while in the presence of Penni Homans, MD.    Penni Homans, MD

## 2022-11-12 ENCOUNTER — Other Ambulatory Visit (HOSPITAL_BASED_OUTPATIENT_CLINIC_OR_DEPARTMENT_OTHER): Payer: Self-pay

## 2022-11-12 ENCOUNTER — Ambulatory Visit: Payer: 59 | Admitting: Gastroenterology

## 2022-11-12 ENCOUNTER — Encounter: Payer: Self-pay | Admitting: Gastroenterology

## 2022-11-12 VITALS — BP 122/80 | HR 83 | Ht 64.5 in | Wt 239.0 lb

## 2022-11-12 DIAGNOSIS — K581 Irritable bowel syndrome with constipation: Secondary | ICD-10-CM

## 2022-11-12 DIAGNOSIS — K219 Gastro-esophageal reflux disease without esophagitis: Secondary | ICD-10-CM

## 2022-11-12 DIAGNOSIS — R1013 Epigastric pain: Secondary | ICD-10-CM | POA: Diagnosis not present

## 2022-11-12 DIAGNOSIS — K743 Primary biliary cirrhosis: Secondary | ICD-10-CM

## 2022-11-12 LAB — COMPREHENSIVE METABOLIC PANEL
ALT: 15 U/L (ref 0–35)
AST: 13 U/L (ref 0–37)
Albumin: 4.4 g/dL (ref 3.5–5.2)
Alkaline Phosphatase: 132 U/L — ABNORMAL HIGH (ref 39–117)
BUN: 17 mg/dL (ref 6–23)
CO2: 30 mEq/L (ref 19–32)
Calcium: 9.2 mg/dL (ref 8.4–10.5)
Chloride: 102 mEq/L (ref 96–112)
Creatinine, Ser: 0.71 mg/dL (ref 0.40–1.20)
GFR: 94.31 mL/min (ref >60.00)
Glucose, Bld: 79 mg/dL (ref 70–99)
Potassium: 3.9 mEq/L (ref 3.5–5.1)
Sodium: 138 mEq/L (ref 135–145)
Total Bilirubin: 0.2 mg/dL (ref 0.2–1.2)
Total Protein: 6.7 g/dL (ref 6.0–8.3)

## 2022-11-12 LAB — VITAMIN D 25 HYDROXY (VIT D DEFICIENCY, FRACTURES): VITD: 41.86 ng/mL (ref 30.00–100.00)

## 2022-11-12 LAB — LIPID PANEL
Cholesterol: 172 mg/dL (ref 0–200)
HDL: 47.2 mg/dL (ref >39.00)
NonHDL: 124.42
Total CHOL/HDL Ratio: 4
Triglycerides: 284 mg/dL — ABNORMAL HIGH (ref 0.0–149.0)
VLDL: 56.8 mg/dL — ABNORMAL HIGH (ref 0.0–40.0)

## 2022-11-12 LAB — CBC
HCT: 40.1 % (ref 36.0–46.0)
Hemoglobin: 13.4 g/dL (ref 12.0–15.0)
MCHC: 33.3 g/dL (ref 30.0–36.0)
MCV: 89.6 fl (ref 78.0–100.0)
Platelets: 364 10*3/uL (ref 150.0–400.0)
RBC: 4.47 Mil/uL (ref 3.87–5.11)
RDW: 13.5 % (ref 11.5–15.5)
WBC: 8.7 10*3/uL (ref 4.0–10.5)

## 2022-11-12 LAB — TSH: TSH: 1.6 u[IU]/mL (ref 0.35–5.50)

## 2022-11-12 LAB — LDL CHOLESTEROL, DIRECT: Direct LDL: 103 mg/dL

## 2022-11-12 LAB — HEMOGLOBIN A1C: Hgb A1c MFr Bld: 5.7 % (ref 4.6–6.5)

## 2022-11-12 MED ORDER — PANTOPRAZOLE SODIUM 40 MG PO TBEC
40.0000 mg | DELAYED_RELEASE_TABLET | Freq: Every day | ORAL | 4 refills | Status: DC
  Filled 2022-11-12 – 2023-01-03 (×2): qty 90, 90d supply, fill #0
  Filled 2023-03-31: qty 90, 90d supply, fill #1
  Filled 2023-06-27: qty 90, 90d supply, fill #2
  Filled 2023-09-23: qty 90, 90d supply, fill #3

## 2022-11-12 MED ORDER — URSODIOL 500 MG PO TABS
500.0000 mg | ORAL_TABLET | Freq: Three times a day (TID) | ORAL | 4 refills | Status: DC
  Filled 2022-11-12: qty 270, 90d supply, fill #0

## 2022-11-12 MED ORDER — URSODIOL 500 MG PO TABS
500.0000 mg | ORAL_TABLET | Freq: Three times a day (TID) | ORAL | 4 refills | Status: DC
  Filled 2022-11-12: qty 80, 27d supply, fill #0
  Filled 2022-11-13: qty 90, 30d supply, fill #0
  Filled 2022-11-13: qty 10, 3d supply, fill #0
  Filled 2022-12-17 (×3): qty 90, 30d supply, fill #1
  Filled 2023-01-31: qty 90, 30d supply, fill #2
  Filled 2023-02-26: qty 90, 30d supply, fill #3
  Filled 2023-03-28: qty 90, 30d supply, fill #4
  Filled 2023-04-28: qty 90, 30d supply, fill #5
  Filled 2023-05-28: qty 90, 30d supply, fill #6
  Filled 2023-06-27: qty 90, 30d supply, fill #7
  Filled 2023-07-28: qty 90, 30d supply, fill #8
  Filled 2023-08-22: qty 90, 30d supply, fill #9
  Filled 2023-10-09: qty 90, 30d supply, fill #10
  Filled 2023-11-03: qty 90, 30d supply, fill #11

## 2022-11-12 NOTE — Patient Instructions (Addendum)
_______________________________________________________  If you are age 57 or older, your body mass index should be between 23-30. Your Body mass index is 40.39 kg/m. If this is out of the aforementioned range listed, please consider follow up with your Primary Care Provider.  If you are age 52 or younger, your body mass index should be between 19-25. Your Body mass index is 40.39 kg/m. If this is out of the aformentioned range listed, please consider follow up with your Primary Care Provider.   ________________________________________________________  The [facility] providers would like to encourage you to use [facility] to communicate with providers for non-urgent requests or questions.  Due to long hold times on the telephone, sending your provider a message by [facility] may be a faster and more efficient way to get a response.  Please allow 48 business hours for a response.  Please remember that this is for non-urgent requests.  _______________________________________________________  We have sent the following medications to your pharmacy for you to pick up at your convenience: Urso Protonix  Please follow up in 3 months. Give [name] a call at [phone] to schedule an appointment. Call Mid December for a February appointment.  Please try walking daily   Please call with any questions or concerns.  Thank you,  Dr. Jackquline Denmark

## 2022-11-12 NOTE — Progress Notes (Signed)
Chief Complaint: FU  Referring Provider:  Mosie Lukes, MD      ASSESSMENT AND PLAN;   #1. GERD with occ nausea/epi pain (d/t Wegovy). Neg EGD 05/2021  #2. IBS-C. Neg colon 05/2021  #3. PBC (elevated Alk phos, +AMA, Liver Bx 05/2021 c/w PBC, No cirrhosis)  Plan: -Increased  URSO 537m po TID (3/day) #180, 4RF -Wt loss  -Protonix 456mpo qd #90 4RF -If still with problems, proceed with GES/HIDA with EF followed by CT scan abdo/pelvis. -FU in 3 months.  Lose 10lb in 3 months. Rpt LFTs in 3 months. 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  Has been having nausea, early satiety and postprandial abdominal discomfort without vomiting ever since she has been on 1 mg Wegovy (since sept 2023) for wt loss.  She has been able to lose 5 pounds since then.  No heartburn with Protonix 40 mg p.o. daily.  No odynophagia or dysphagia.  She has been taking MiraLAX 17 g p.o. daily with resultant improvement in her constipation.  She has been drinking enough water.  Had negative colonoscopy except for fair prep as below  No alcohol.  No jaundice dark urine or pale stools.   FH - colon ca-paternal aunt.  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 - Otherwise grossly normal colonoscopy to cecum. - No specimens  collected. - Repeat colonoscopy in 5 years for screening purposes d/t quality of preparation/previous history of polyp with 2-day prep. Earlier, if clinically indicated.  EGD 05/2021 - Gastritis. Bx- neg for HP - A few gastric polyps. Resected and retrieved x 3. Bx-fundic gland polyps - Neg SB bx for celiac   Colonoscopy 05/02/2016 - One 1 mm polyp in the cecum, removed with a cold snare. Resected and retrieved. - The examination was otherwise normal on direct and retroflexion views.  EGD 09/2011: Nl   Liver Bx 06/07/2021 A. LIVER, LEFT LOBE, NEEDLE CORE BIOPSY:  - Patchy portal-based inflammation and mild to moderate lobular  inflammation and loss of bile ducts;  - Minimal fibrosis (stage 0-1 of 4)  Past Medical History:  Diagnosis Date   Allergic state 12/08/2016   Allergy    Anxiety    Back pain    " i just have a weak loer back"    Fibroids    GERD (gastroesophageal reflux disease)    Hyperlipidemia 06/09/2017   Insulin resistance    Joint pain  Knee pain, right 03/10/2017   Plantar fasciitis    PONV (postoperative nausea and vomiting)    just nausea    Shortness of breath    not only if  i exert myselg    Sinusitis    Sleep apnea    Sleep apnea in adult 12/26/2020   Vitamin D deficiency 11/22/2016   Vitamin D deficiency     Past Surgical History:  Procedure Laterality Date   ABDOMINAL HYSTERECTOMY  09/2019   [facility]   COLONOSCOPY  02-2009   with [facility] normal   CYSTOSCOPY N/A 10/12/2019   Procedure: CYSTOSCOPY;  Surgeon: Hailey Hay, [facility];  Location: [facility];  Service: Gynecology;  Laterality: N/A;   ESOPHAGOGASTRODUODENOSCOPY  2012   GANGLION CYST EXCISION Right    right- wrist    NASAL SEPTUM SURGERY     NASAL SINUS SURGERY     TMJ ARTHROSCOPY     left   TOTAL LAPAROSCOPIC HYSTERECTOMY WITH BILATERAL SALPINGO OOPHORECTOMY Bilateral 10/12/2019   Procedure: TOTAL LAPAROSCOPIC HYSTERECTOMY WITH BILATERAL SALPINGO OOPHORECTOMY,  Lysis Pelvic Adhseions ;  Surgeon: Hailey Hay, [facility];  Location: [facility];  Service: Gynecology;  Laterality: Bilateral;    Family History  Problem Relation Age of Onset   Hypertension Father    Diabetes Father    Hyperlipidemia Father    Breast cancer Mother 64   Cancer Mother        breast   Thyroid disease Mother    Sleep apnea Mother    Colon cancer Paternal Grandfather        died at age 27   Cancer Paternal Grandfather        GI CA   Obesity Sister    Diabetes Sister    Hypertension Sister    Heart disease Maternal Grandmother        MI   Cancer Maternal Grandfather        lung, smoker   Diabetes Maternal Grandfather    Diabetes Paternal Grandmother    Heart disease Paternal Grandmother    Pancreatitis Sister    Esophageal cancer Neg Hx    Stomach cancer Neg Hx    Stroke Neg Hx    Colon polyps Neg Hx    Rectal cancer Neg Hx    Pancreatic cancer Neg Hx     Social History   Tobacco Use   Smoking status: Never   Smokeless tobacco: Never  Vaping Use   Vaping Use: Never used  Substance Use Topics   Alcohol use: Yes    Alcohol/week: 0.0 standard drinks of alcohol    Comment: rare   Drug use: No    Current Outpatient Medications  Medication Sig Dispense Refill   acetaminophen (TYLENOL) 500 MG tablet Take 500 mg by mouth every 6 (six) hours as needed for headache.     Calcium Carbonate-Vitamin D (CALTRATE 600+D PO) Take 1,200 mg by mouth daily.     EPINEPHrine (EPIPEN 2-PAK) 0.3 mg/0.3 mL IJ SOAJ injection Inject 0.3 mg into the muscle as needed for anaphylaxis (repeat in 15 minutes if symptoms return seek care). 2 each 1   escitalopram (LEXAPRO) 10 MG tablet Take 1 tablet (10 mg total) by mouth daily. 90 tablet 0   fluticasone (FLONASE) 50 MCG/ACT nasal spray Place 2 sprays into both nostrils daily. 16 g 5   meloxicam (MOBIC) 15 MG tablet Take 1 tablet (15 mg total) by mouth daily. 45 tablet 1   metFORMIN (GLUCOPHAGE-XR)  500 MG 24 hr  tablet Take 1 tablet (500 mg total) by mouth daily with breakfast. 90 tablet 1   pantoprazole (PROTONIX) 40 MG tablet Take 1 tablet (40 mg total) by mouth daily. 90 tablet 3   Polyethyl Glycol-Propyl Glycol (SYSTANE) 0.4-0.3 % SOLN Place 1 drop into both eyes daily as needed (Allergies).     Polyethylene Glycol 3350 (MIRALAX PO) Take by mouth.     Semaglutide-Weight Management (WEGOVY) 1 MG/0.5ML SOAJ Inject 1 mg into the skin once a week. 2 mL 1   ursodiol (URSO FORTE) 500 MG tablet Take 1 tablet (500 mg total) by mouth in the morning and at bedtime. Please call [phone] to call to schedule an office visit for more refills 60 tablet 6   Vitamin D, Cholecalciferol, 25 MCG (1000 UT) TABS Take 4,000 units daily 60 tablet    No current facility-administered medications for this visit.    Allergies  Allergen Reactions   Coconut Fatty Acids Hives   Codeine Hives and Itching   Mango Flavor Hives   [name] Itching and Swelling   Oxycodone-Acetaminophen     rash on face and head, itching   Tussionex Pennkinetic Er [Hydrocod Poli-Chlorphe Poli Er]     rash on face and head, itching   Ultram [Tramadol] Other (See Comments)    Dizzy and low blood pressure.     Review of Systems:  Psychiatric/Behavioral: Has anxiety or depression     Physical Exam:    BP 122/80   Pulse 83   Ht 5' 4.5" (1.638 m)   Wt 239 lb (108.4 kg)   LMP 11/22/2018 (Approximate)   BMI 40.39 kg/m  Wt Readings from Last 3 Encounters:  11/12/22 239 lb (108.4 kg)  11/11/22 240 lb (108.9 kg)  11/04/22 240 lb 9.3 oz (109.1 kg)   Constitutional:  Well-developed, in no acute distress. Psychiatric: Normal mood and affect. Behavior is normal. HEENT: Pupils normal.  Conjunctivae are normal. No scleral icterus. Cardiovascular: Normal rate, regular rhythm. No edema Pulmonary/chest: Effort normal and breath sounds normal. No wheezing, rales or rhonchi. Abdominal: Soft, nondistended.  Mild epigastric tenderness and  mild left lower quadrant abdominal tenderness without rebound.. Bowel sounds active throughout. There are no masses palpable. No hepatomegaly. Neurological: Alert and oriented to person place and time. Skin: Skin is warm and dry. No rashes noted.  Data Reviewed: I have personally reviewed following labs and imaging studies  CBC:    Latest Ref Rng & Units 11/11/2022    3:53 PM 04/25/2022    9:47 AM 07/27/2021    9:06 AM  CBC  WBC 4.0 - 10.5 K/uL 8.7  7.5  7.0   Hemoglobin 12.0 - 15.0 g/dL 13.4  12.9  12.7   Hematocrit 36.0 - 46.0 % 40.1  39.7  38.0   Platelets 150.0 - 400.0 K/uL 364.0  337.0  358.0     CMP:    Latest Ref Rng & Units 11/11/2022    3:53 PM 04/25/2022    9:47 AM 07/25/2021    3:58 PM  CMP  Glucose 70 - 99 mg/dL 79  84    BUN 6 - 23 mg/dL 17  13    Creatinine 0.40 - 1.20 mg/dL 0.71  0.58    Sodium 135 - 145 mEq/L 138  139    Potassium 3.5 - 5.1 mEq/L 3.9  4.4    Chloride 96 - 112 mEq/L 102  104    CO2 19 - 32 mEq/L 30  28    Calcium 8.4 - 10.5 mg/dL 9.2  8.9    Total Protein 6.0 - 8.3 g/dL 6.7  6.9  6.6   Total Bilirubin 0.2 - 1.2 mg/dL 0.2  0.3  0.2   Alkaline Phos 39 - 117 U/L 132  142  125   AST 0 - 37 U/L _0 ALT 0 - 35 U/L _1 Carmell Austria, MD 11/12/2022, 3:58 PM  Cc: Hailey Lukes, MD

## 2022-11-13 ENCOUNTER — Other Ambulatory Visit (HOSPITAL_BASED_OUTPATIENT_CLINIC_OR_DEPARTMENT_OTHER): Payer: Self-pay

## 2022-11-19 NOTE — Progress Notes (Unsigned)
PATIENT: [facility] DOB: 1966-04-25  REASON FOR VISIT: follow up HISTORY FROM: patient  HISTORY OF PRESENT ILLNESS: Today 11/19/22:    11/20/21: Hailey Noble is a 56 year old female with a history of obstructive sleep apnea on CPAP.  She returns today for follow-up.  She reports that she did have a mask refitting in July and now has a F 20 fullface mask that works well for her.  She states however the DME company keeps sending her the wrong mask.  Otherwise the CPAP works well for her.  She states that she had COVID in July.  She states since then she has had ongoing issues with congestion.  She has tried multiple over-the-counter medications with minimal benefit.  She is considering getting her PCP to refer her to ENT.  05/15/21: [facility] is a 56 year old female with a history of obstructive sleep apnea on CPAP.  She returns today for follow-up.  She reports that she is having trouble with the mask leaking.  She states that often wakes her up during the night.  She states that if she sleeps on her side the mask will leak.  When she first started CPAP she did notice the benefit.    HISTORY 11/13/2020: I reviewed her CPAP compliance data from 10/02/2020 through 10/31/2020, which is a total of 30 days, during which time she used her machine 26 days with percent use days greater than 4 hours at 70%, indicating adequate compliance with an average usage of 5 hours and 18 minutes, residual AHI at goal at 1.6/h, leak consistently on the higher side with the 95th percentile at 60.8 L/min on a pressure of 14 cm with EPR of 3.  She reports doing well, she has initial adjustment challenges to her CPAP but feels well adapted to treatment and has benefited from it.  She reports better sleep consolidation, better sleep quality and less daytime somnolence.  She does notice a leak from the mask, she tries to sleep sideways but she sleeps with her arm extended above her head on the right side.  She would wake up  at times and the mask is leaking from the mouth and mouth is slightly open, she does have some dryness in her mouth from the CPAP, she also uses a humidifier in the room as well as distilled water in the humidifier chamber of the machine.  She uses a DreamWear full facemask.  She was started on Saxenda about a month ago.  She is still adjusting the dosing with her weight loss specialist.  Her right nostril is often stopped up and she does have some nosebleeds from the right side which is not severe.   REVIEW OF SYSTEMS: Out of a complete 14 system review of symptoms, the patient complains only of the following symptoms, and all other reviewed systems are negative.  FSS 46 ESS 15  ALLERGIES: Allergies  Allergen Reactions   Coconut Fatty Acids Hives   Codeine Hives and Itching   Mango Flavor Hives   [name] Itching and Swelling   Oxycodone-Acetaminophen     rash on face and head, itching   Tussionex Pennkinetic Er [Hydrocod Poli-Chlorphe Poli Er]     rash on face and head, itching   Ultram [Tramadol] Other (See Comments)    Dizzy and low blood pressure.     HOME MEDICATIONS: Outpatient Medications Prior to Visit  Medication Sig Dispense Refill   acetaminophen (TYLENOL) 500 MG tablet Take 500 mg by mouth every 6 (  six) hours as needed for headache.     Calcium Carbonate-Vitamin D (CALTRATE 600+D PO) Take 1,200 mg by mouth daily.     EPINEPHrine (EPIPEN 2-PAK) 0.3 mg/0.3 mL IJ SOAJ injection Inject 0.3 mg into the muscle as needed for anaphylaxis (repeat in 15 minutes if symptoms return seek care). 2 each 1   escitalopram (LEXAPRO) 10 MG tablet Take 1 tablet (10 mg total) by mouth daily. 90 tablet 0   fluticasone (FLONASE) 50 MCG/ACT nasal spray Place 2 sprays into both nostrils daily. 16 g 5   meloxicam (MOBIC) 15 MG tablet Take 1 tablet (15 mg total) by mouth daily. 45 tablet 1   metFORMIN (GLUCOPHAGE-XR) 500 MG 24 hr tablet Take 1 tablet (500 mg total) by mouth daily with breakfast.  90 tablet 1   pantoprazole (PROTONIX) 40 MG tablet Take 1 tablet (40 mg total) by mouth daily. 90 tablet 4   Polyethyl Glycol-Propyl Glycol (SYSTANE) 0.4-0.3 % SOLN Place 1 drop into both eyes daily as needed (Allergies).     Polyethylene Glycol 3350 (MIRALAX PO) Take by mouth.     Semaglutide-Weight Management (WEGOVY) 1 MG/0.5ML SOAJ Inject 1 mg into the skin once a week. 2 mL 1   ursodiol (URSO FORTE) 500 MG tablet Take 1 tablet (500 mg total) by mouth 3 (three) times daily. 270 tablet 4   Vitamin D, Cholecalciferol, 25 MCG (1000 UT) TABS Take 4,000 units daily 60 tablet    No facility-administered medications prior to visit.    PAST MEDICAL HISTORY: Past Medical History:  Diagnosis Date   Allergic state 12/08/2016   Allergy    Anxiety    Back pain    " i just have a weak loer back"    Fibroids    GERD (gastroesophageal reflux disease)    Hyperlipidemia 06/09/2017   Insulin resistance    Joint pain    Knee pain, right 03/10/2017   Plantar fasciitis    PONV (postoperative nausea and vomiting)    just nausea    Shortness of breath    not only if  i exert myselg    Sinusitis    Sleep apnea    Sleep apnea in adult 12/26/2020   Vitamin D deficiency 11/22/2016   Vitamin D deficiency     PAST SURGICAL HISTORY: Past Surgical History:  Procedure Laterality Date   ABDOMINAL HYSTERECTOMY  09/2019   [facility]   COLONOSCOPY  02-2009   with Henrene Pastor normal   CYSTOSCOPY N/A 10/12/2019   Procedure: CYSTOSCOPY;  Surgeon: Sherlyn Hay, DO;  Location: [place] [facility];  Service: Gynecology;  Laterality: N/A;   ESOPHAGOGASTRODUODENOSCOPY  2012   GANGLION CYST EXCISION Right    right- wrist    NASAL SEPTUM SURGERY     NASAL SINUS SURGERY     TMJ ARTHROSCOPY     left   TOTAL LAPAROSCOPIC HYSTERECTOMY WITH BILATERAL SALPINGO OOPHORECTOMY Bilateral 10/12/2019   Procedure: TOTAL LAPAROSCOPIC HYSTERECTOMY WITH BILATERAL SALPINGO OOPHORECTOMY, Lysis Pelvic Adhseions ;   Surgeon: Sherlyn Hay, DO;  Location: [facility];  Service: Gynecology;  Laterality: Bilateral;    FAMILY HISTORY: Family History  Problem Relation Age of Onset   Hypertension Father    Diabetes Father    Hyperlipidemia Father    Breast cancer Mother 9   Cancer Mother        breast   Thyroid disease Mother    Sleep apnea Mother    Colon cancer Paternal Grandfather  died at age 31   Cancer Paternal Grandfather        GI CA   Obesity Sister    Diabetes Sister    Hypertension Sister    Heart disease Maternal Grandmother        MI   Cancer Maternal Grandfather        lung, smoker   Diabetes Maternal Grandfather    Diabetes Paternal Grandmother    Heart disease Paternal Grandmother    Pancreatitis Sister    Esophageal cancer Neg Hx    Stomach cancer Neg Hx    Stroke Neg Hx    Colon polyps Neg Hx    Rectal cancer Neg Hx    Pancreatic cancer Neg Hx     SOCIAL HISTORY: Social History   Socioeconomic History   Marital status: Married    Spouse name: Not on file   Number of children: Not on file   Years of education: Not on file   Highest education level: Not on file  Occupational History   Occupation: Programmer, multimedia: [employer]  Tobacco Use   Smoking status: Never   Smokeless tobacco: Never  Vaping Use   Vaping Use: Never used  Substance and Sexual Activity   Alcohol use: Yes    Alcohol/week: 0.0 standard drinks of alcohol    Comment: rare   Drug use: No   Sexual activity: Yes    Birth control/protection: I.U.D.  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 Works at [employer]. No dietary restrictions. Lives with dog   Social Determinants of Health   Financial Resource Strain: Not on file  Food Insecurity: Not on file  Transportation Needs: Not on file  Physical Activity: Not on file  Stress: Not on file  Social Connections:  Not on file  Intimate Partner Violence: Not on file      PHYSICAL EXAM  There were no vitals filed for this visit.  There is no height or weight on file to calculate BMI.  Generalized: Well developed, in no acute distress  Chest: Lungs clear to auscultation bilaterally  Neurological examination  Mentation: Alert oriented to time, place, history taking. Follows all commands speech and language fluent Cranial nerve II-XII: Extraocular movements were full, visual field were full on confrontational test Head turning and shoulder shrug  were normal and symmetric. Motor: The motor testing reveals 5 over 5 strength of all 4 extremities. Good symmetric motor tone is noted throughout.  Sensory: Sensory testing is intact to soft touch on all 4 extremities. No evidence of extinction is noted.  Gait and station: Gait is normal.    DIAGNOSTIC DATA (LABS, IMAGING, TESTING) - I reviewed patient records, labs, notes, testing and imaging myself where available.  Lab Results  Component Value Date   WBC 8.7 11/11/2022   HGB 13.4 11/11/2022   HCT 40.1 11/11/2022   MCV 89.6 11/11/2022   PLT 364.0 11/11/2022      Component Value Date/Time   NA 138 11/11/2022 1553   NA 143 01/24/2021 0843   K 3.9 11/11/2022 1553   CL 102 11/11/2022 1553   CO2 30 11/11/2022 1553   GLUCOSE 79 11/11/2022 1553   GLUCOSE 86 11/13/2009 0000   BUN 17 11/11/2022 1553   BUN 16 01/24/2021 0843   CREATININE 0.71 11/11/2022 1553   CREATININE 0.67 11/22/2016 1521   CALCIUM 9.2  11/11/2022 1553   PROT 6.7 11/11/2022 1553   PROT 6.8 01/24/2021 0843   ALBUMIN 4.4 11/11/2022 1553   ALBUMIN 4.3 01/24/2021 0843   AST 13 11/11/2022 1553   ALT 15 11/11/2022 1553   ALKPHOS 132 (H) 11/11/2022 1553   BILITOT 0.2 11/11/2022 1553   BILITOT 0.2 01/24/2021 0843   GFRNONAA 108 01/24/2021 0843   GFRAA 124 01/24/2021 0843   Lab Results  Component Value Date   CHOL 172 11/11/2022   HDL 47.20 11/11/2022   LDLCALC 116 (H)  05/14/2021   LDLDIRECT 103.0 11/11/2022   TRIG 284.0 (H) 11/11/2022   CHOLHDL 4 11/11/2022   Lab Results  Component Value Date   HGBA1C 5.7 11/11/2022   Lab Results  Component Value Date   VITAMINB12 678 10/06/2017   Lab Results  Component Value Date   TSH 1.60 11/11/2022      ASSESSMENT AND PLAN 56 y.o. year old female  has a past medical history of Allergic state (12/08/2016), Allergy, Anxiety, Back pain, Fibroids, GERD (gastroesophageal reflux disease), Hyperlipidemia (06/09/2017), Insulin resistance, Joint pain, Knee pain, right (03/10/2017), Plantar fasciitis, PONV (postoperative nausea and vomiting), Shortness of breath, Sinusitis, Sleep apnea, Sleep apnea in adult (12/26/2020), Vitamin D deficiency (11/22/2016), and Vitamin D deficiency. here with:  OSA on CPAP  - CPAP compliance good - Good treatment of AHI  - Mask refitting ordered - Encourage patient to use CPAP nightly and > 4 hours each night - F/U in 1 year or sooner if needed   Ward Givens, MSN, NP-C 11/19/2022, 4:41 PM [facility] [address], [place], [place] [address] [phone]

## 2022-11-20 ENCOUNTER — Ambulatory Visit: Payer: 59 | Admitting: Adult Health

## 2022-11-20 ENCOUNTER — Encounter: Payer: Self-pay | Admitting: Adult Health

## 2022-11-20 VITALS — BP 134/75 | HR 76 | Ht 65.0 in | Wt 239.0 lb

## 2022-11-20 DIAGNOSIS — G4733 Obstructive sleep apnea (adult) (pediatric): Secondary | ICD-10-CM

## 2022-11-21 ENCOUNTER — Other Ambulatory Visit: Payer: Self-pay

## 2022-11-21 ENCOUNTER — Other Ambulatory Visit: Payer: Self-pay | Admitting: Family Medicine

## 2022-11-21 ENCOUNTER — Other Ambulatory Visit (HOSPITAL_BASED_OUTPATIENT_CLINIC_OR_DEPARTMENT_OTHER): Payer: Self-pay

## 2022-11-21 MED ORDER — WEGOVY 1.7 MG/0.75ML ~~LOC~~ SOAJ
1.7000 mg | SUBCUTANEOUS | 0 refills | Status: DC
  Filled 2022-11-21: qty 3, 28d supply, fill #0

## 2022-11-23 IMAGING — US US ABDOMEN COMPLETE
1 series · 13 of 25 positions shown · non-contrast
Comparison: None.

CLINICAL DATA: Epigastric pain, nausea and vomiting.

EXAM:
ABDOMEN ULTRASOUND COMPLETE

[Series 1: us abdomen complete · 13 of 74 slices shown]
[im 1/74]
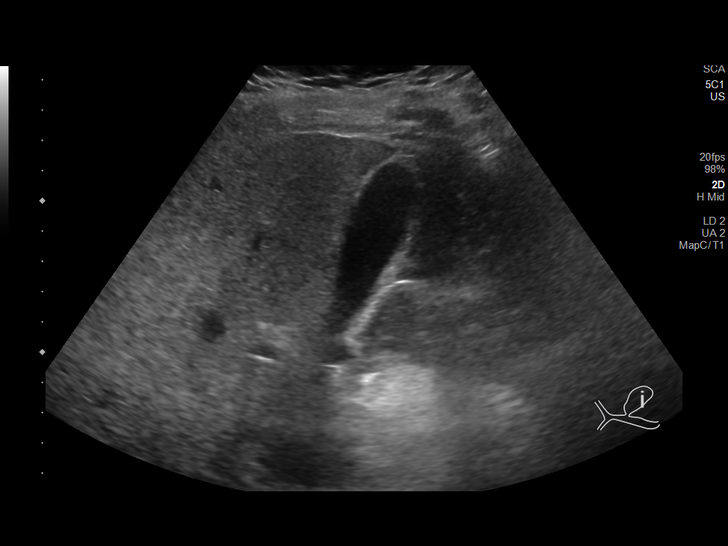
[im 7/74]
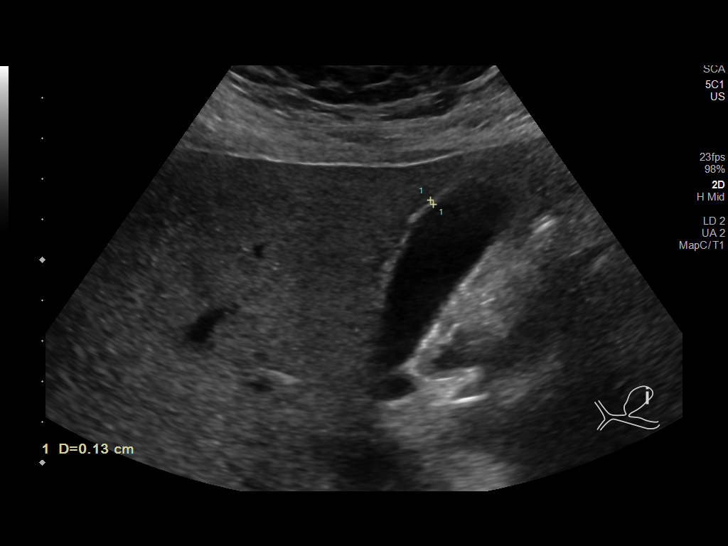
[im 13/74]
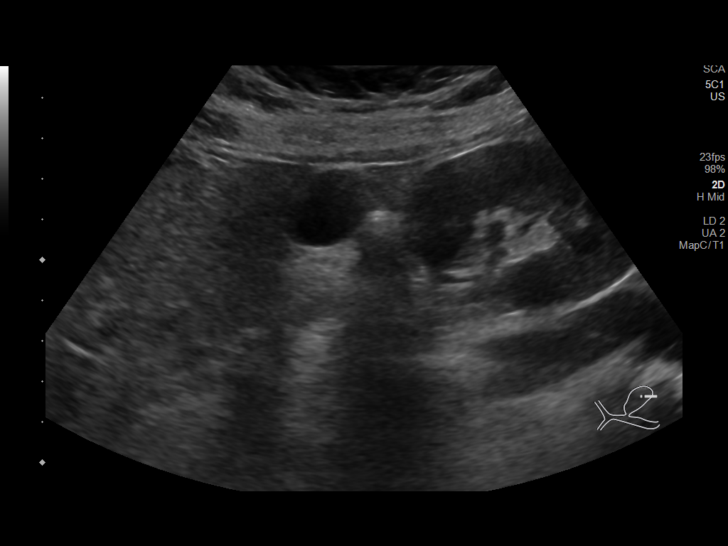
[im 19/74]
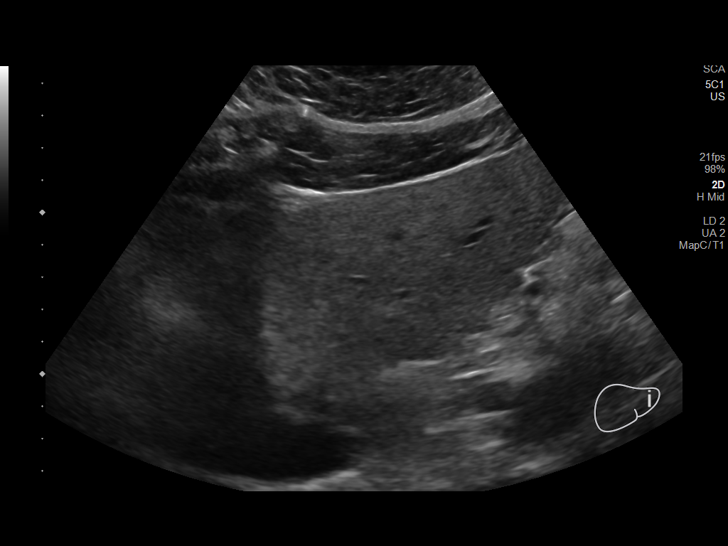
[im 25/74]
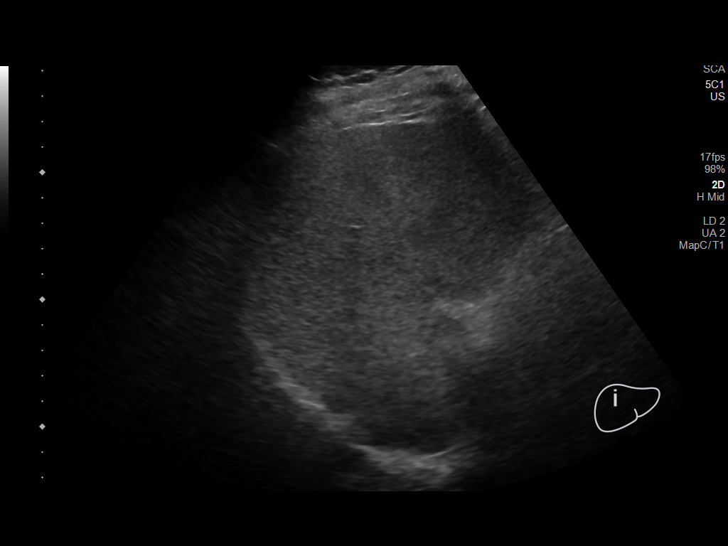
[im 31/74]
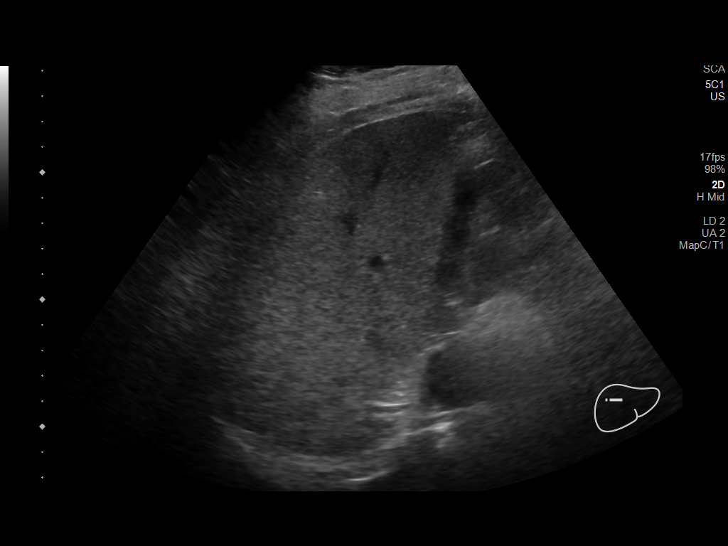
[im 37/74]
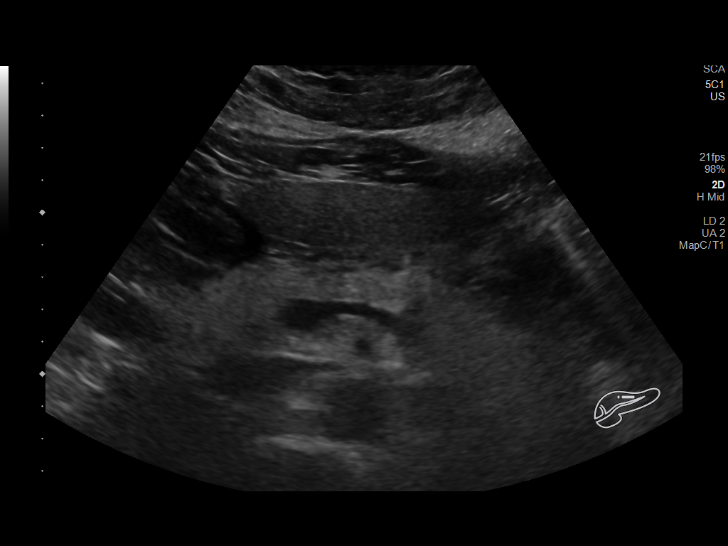
[im 43/74]
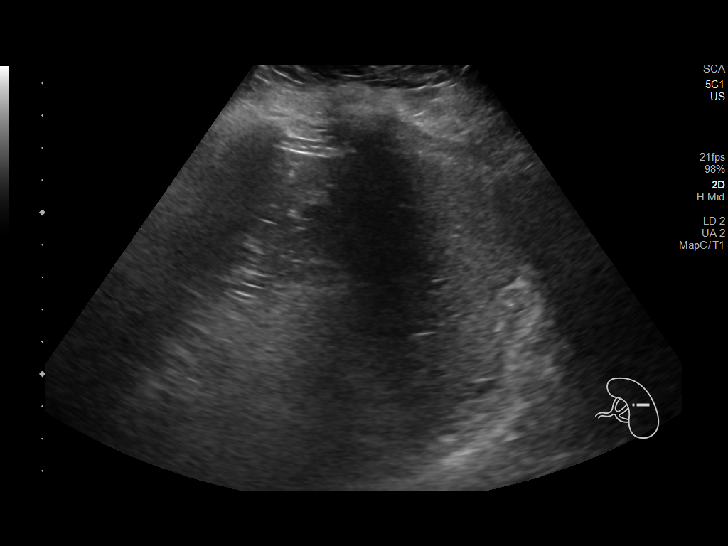
[im 49/74]
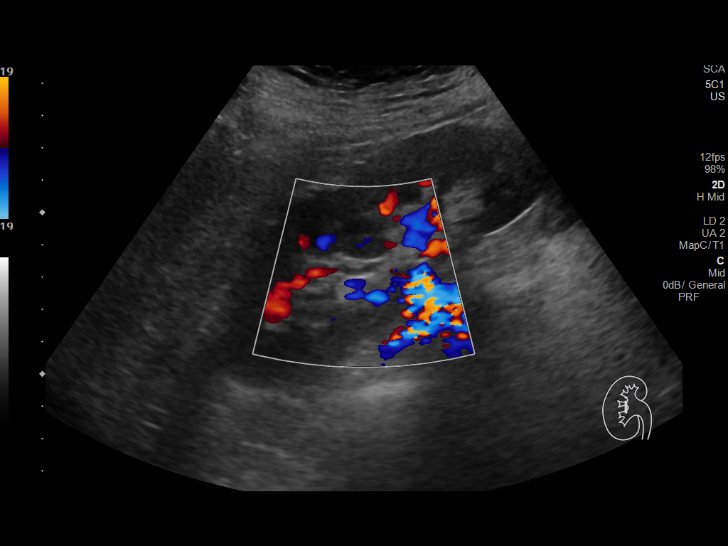
[im 55/74]
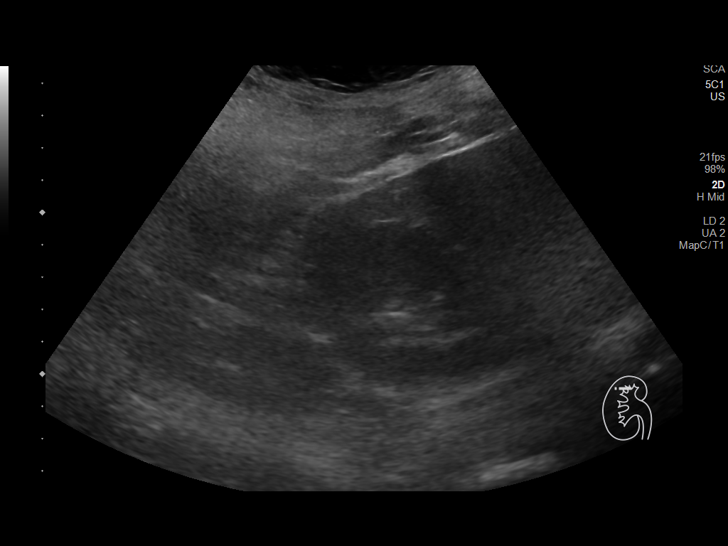
[im 61/74]
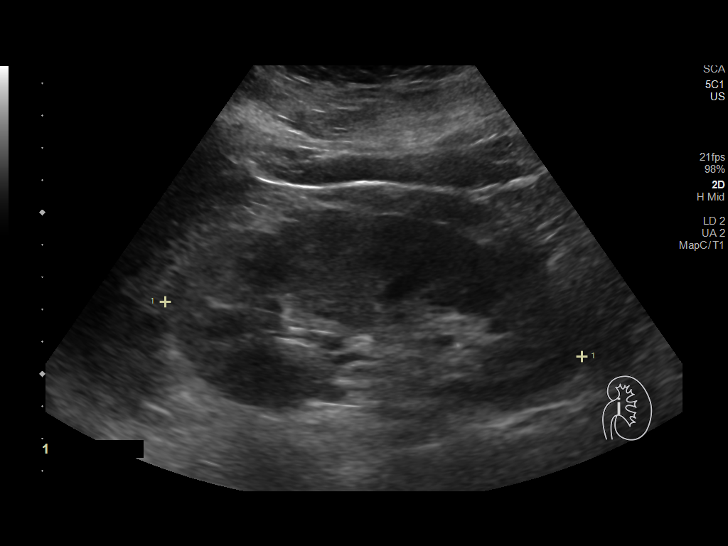
[im 67/74]
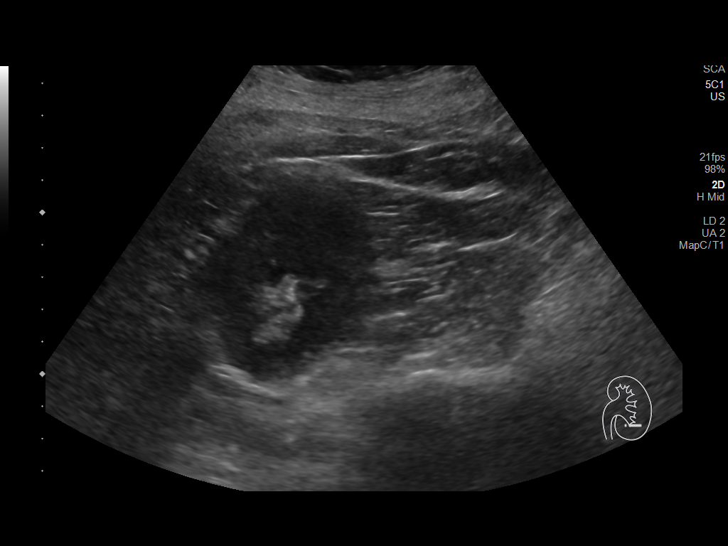
[im 74/74]
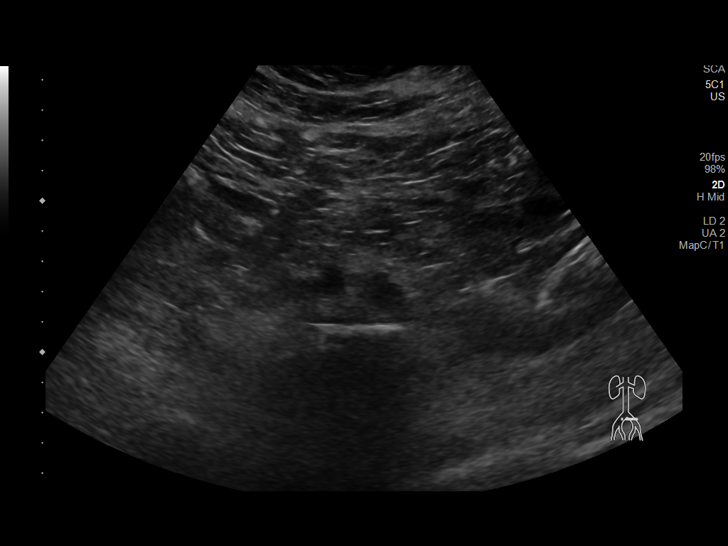

[13 of 25 positions shown; findings below may reference images not displayed]

FINDINGS: Gallbladder: No gallstones or wall thickening visualized. 5 mm
gallbladder polyp. No sonographic Murphy sign noted by sonographer.

Common bile duct: Diameter: 3 mm

Liver: No focal lesion identified. Diffusely increased parenchymal
echogenicity. Portal vein is patent on color Doppler imaging with
normal direction of blood flow towards the liver.

IVC: No abnormality visualized.

Pancreas: Visualized portion unremarkable.

Spleen: Size and appearance within normal limits.

Right Kidney: Length: 11.8 cm. Echogenicity within normal limits. No
mass or hydronephrosis visualized.

Left Kidney: Length: 13 cm. Echogenicity within normal limits. No
mass or hydronephrosis visualized.

Abdominal aorta: No aneurysm visualized.

Other findings: None.
IMPRESSION: 1. The echogenicity of the liver is increased. This is a nonspecific
finding but is most commonly seen with fatty infiltration of the
liver. There are no obvious focal liver lesions.
2. Gallbladder polyp measuring 5 mm. No further imaging evaluation
or follow-up necessary. Per consensus guidelines, this requires no
additional evaluation or specific follow-up. This recommendation
follows ACR consensus guidelines: White Paper of the ACR Incidental
findings Committee II on Gallbladder and Biliary Findings. [HOSPITAL] 9636:;[DATE].

## 2022-12-04 ENCOUNTER — Encounter: Payer: Self-pay | Admitting: Family Medicine

## 2022-12-06 ENCOUNTER — Other Ambulatory Visit (HOSPITAL_BASED_OUTPATIENT_CLINIC_OR_DEPARTMENT_OTHER): Payer: Self-pay

## 2022-12-06 ENCOUNTER — Other Ambulatory Visit: Payer: Self-pay | Admitting: Family Medicine

## 2022-12-06 MED ORDER — BUPROPION HCL 75 MG PO TABS
75.0000 mg | ORAL_TABLET | Freq: Every day | ORAL | 1 refills | Status: DC
  Filled 2022-12-06 (×2): qty 90, 90d supply, fill #0
  Filled 2023-03-03: qty 90, 90d supply, fill #1

## 2022-12-09 DIAGNOSIS — G4733 Obstructive sleep apnea (adult) (pediatric): Secondary | ICD-10-CM | POA: Diagnosis not present

## 2022-12-17 ENCOUNTER — Other Ambulatory Visit (HOSPITAL_BASED_OUTPATIENT_CLINIC_OR_DEPARTMENT_OTHER): Payer: Self-pay

## 2022-12-17 ENCOUNTER — Other Ambulatory Visit: Payer: Self-pay

## 2022-12-17 ENCOUNTER — Other Ambulatory Visit: Payer: Self-pay | Admitting: Family Medicine

## 2022-12-17 MED ORDER — METFORMIN HCL ER 500 MG PO TB24
500.0000 mg | ORAL_TABLET | Freq: Every day | ORAL | 1 refills | Status: DC
  Filled 2022-12-17: qty 90, 90d supply, fill #0
  Filled 2023-03-11: qty 90, 90d supply, fill #1

## 2022-12-17 MED ORDER — WEGOVY 2.4 MG/0.75ML ~~LOC~~ SOAJ
2.4000 mg | SUBCUTANEOUS | 2 refills | Status: DC
  Filled 2022-12-17 – 2023-03-14 (×4): qty 3, 28d supply, fill #0

## 2022-12-18 ENCOUNTER — Telehealth: Payer: Self-pay

## 2022-12-18 ENCOUNTER — Other Ambulatory Visit (HOSPITAL_BASED_OUTPATIENT_CLINIC_OR_DEPARTMENT_OTHER): Payer: Self-pay

## 2022-12-18 NOTE — Telephone Encounter (Signed)
PA initiated via Covermymeds; KEY: [id number]. Awaiting determination.

## 2022-12-19 ENCOUNTER — Other Ambulatory Visit (HOSPITAL_BASED_OUTPATIENT_CLINIC_OR_DEPARTMENT_OTHER): Payer: Self-pay

## 2022-12-19 NOTE — Telephone Encounter (Signed)
PA denied.   For renewal of Wegovy pens in adult patients needing weight loss or weight management,  our guideline named ANTI-OBESITY AGENTS (reviewed for [facility]) requires that you have lost at least 5% of baseline body weight.  This request has been denied because we did not receive information that you meet this  requirement. Please work with your doctor to use a different medication or get [name] more  information if it will allow [name] to approve this request.

## 2022-12-20 ENCOUNTER — Other Ambulatory Visit (HOSPITAL_BASED_OUTPATIENT_CLINIC_OR_DEPARTMENT_OTHER): Payer: Self-pay

## 2022-12-20 DIAGNOSIS — Z1231 Encounter for screening mammogram for malignant neoplasm of breast: Secondary | ICD-10-CM | POA: Diagnosis not present

## 2022-12-20 LAB — HM MAMMOGRAPHY

## 2022-12-24 ENCOUNTER — Encounter: Payer: Self-pay | Admitting: Family Medicine

## 2022-12-26 ENCOUNTER — Other Ambulatory Visit (HOSPITAL_BASED_OUTPATIENT_CLINIC_OR_DEPARTMENT_OTHER): Payer: Self-pay

## 2022-12-27 ENCOUNTER — Other Ambulatory Visit (HOSPITAL_BASED_OUTPATIENT_CLINIC_OR_DEPARTMENT_OTHER): Payer: Self-pay

## 2022-12-27 ENCOUNTER — Telehealth: Payer: Self-pay

## 2022-12-27 NOTE — Telephone Encounter (Signed)
Spoke with pt to let her know that her prior [facility] was denied due to not losing enough weight. Pt would like to know if there is another medication she could try because she feels like her body may develop tolerance to the drug.

## 2022-12-30 NOTE — Telephone Encounter (Signed)
Lvm with info and told to call back if she would like to try

## 2022-12-31 NOTE — Telephone Encounter (Signed)
Left message on machine to call back  

## 2023-01-01 NOTE — Telephone Encounter (Signed)
Pt came into office to let Dr. Charlett Blake know that she isn't going to bother with the [place] and if there was anything we were doing on our regarding it to disregard.

## 2023-01-03 ENCOUNTER — Other Ambulatory Visit (HOSPITAL_BASED_OUTPATIENT_CLINIC_OR_DEPARTMENT_OTHER): Payer: Self-pay

## 2023-01-03 ENCOUNTER — Other Ambulatory Visit: Payer: Self-pay

## 2023-01-03 ENCOUNTER — Other Ambulatory Visit: Payer: Self-pay | Admitting: Family Medicine

## 2023-01-03 DIAGNOSIS — F418 Other specified anxiety disorders: Secondary | ICD-10-CM

## 2023-01-03 MED ORDER — ESCITALOPRAM OXALATE 10 MG PO TABS
10.0000 mg | ORAL_TABLET | Freq: Every day | ORAL | 0 refills | Status: DC
  Filled 2023-01-03: qty 90, 90d supply, fill #0

## 2023-01-05 IMAGING — US US BIOPSY CORE LIVER
1 series · 13 of 13 positions shown · non-contrast
Comparison: none

INDICATION: Elevated alkaline phosphatase

[Series 1: us biopsy (liver) · 13 of 13 slices shown]
[im 1/13]
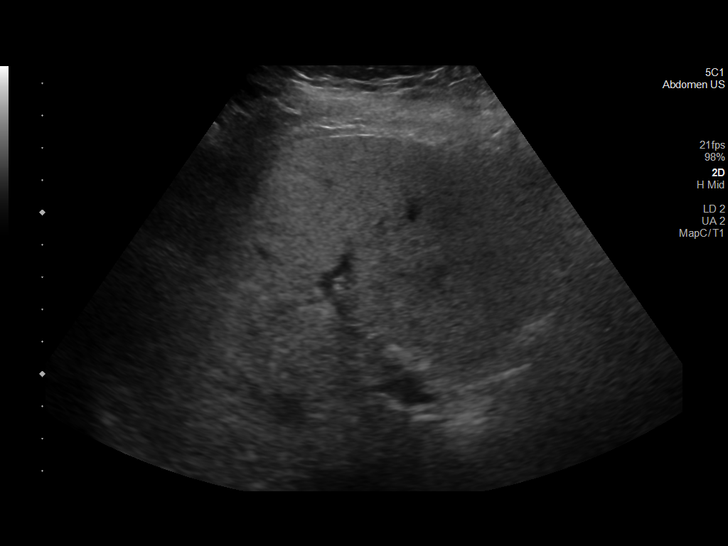
[im 2/13]
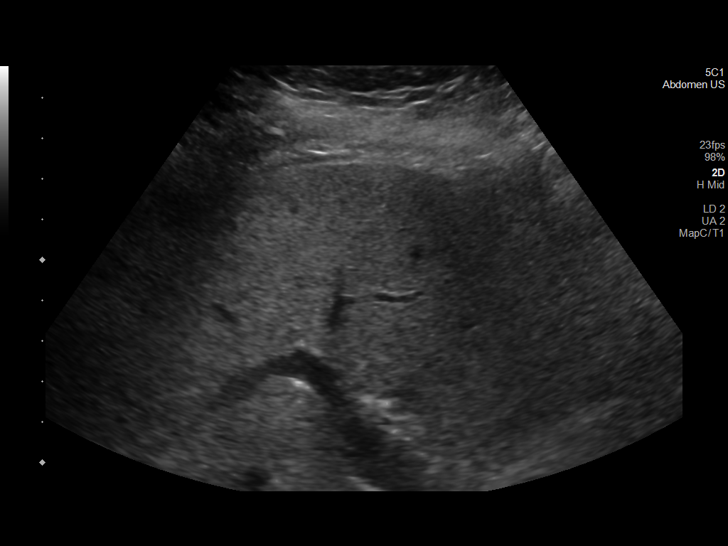
[im 3/13]
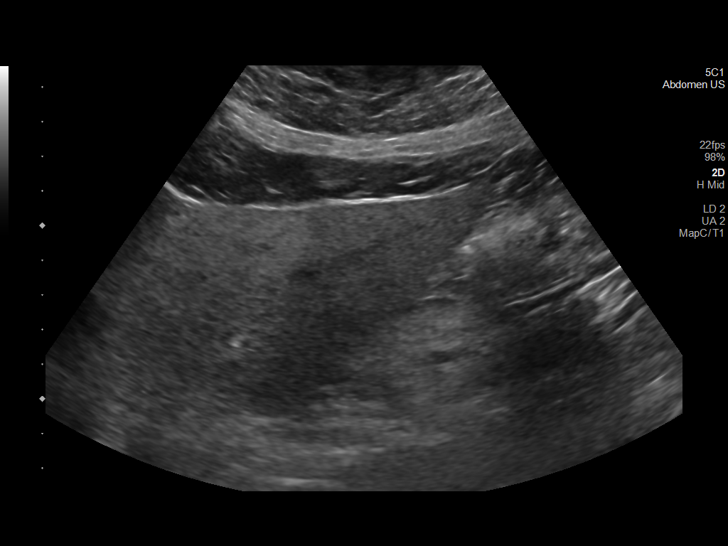
[im 4/13]
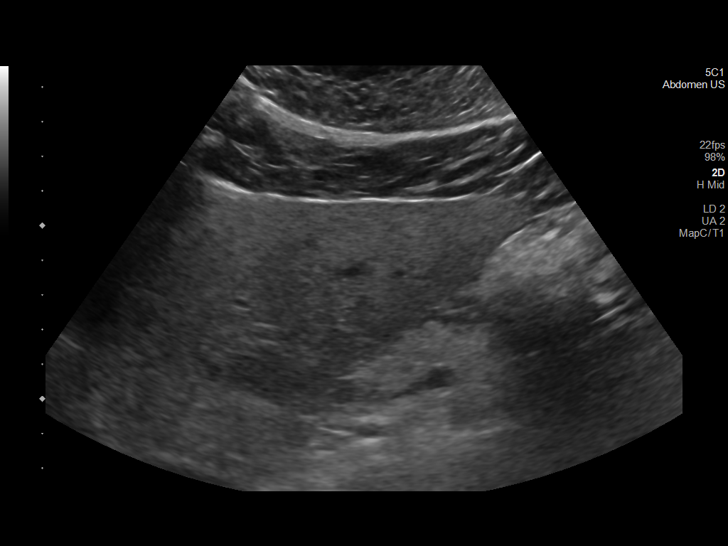
[im 5/13]
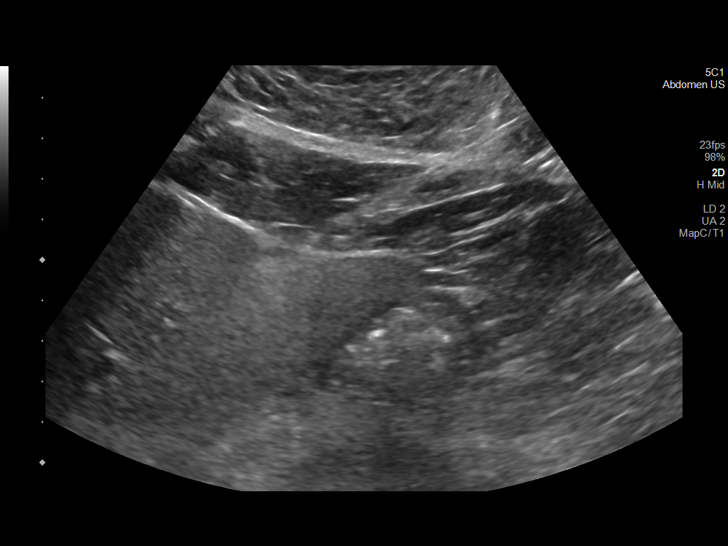
[im 6/13]
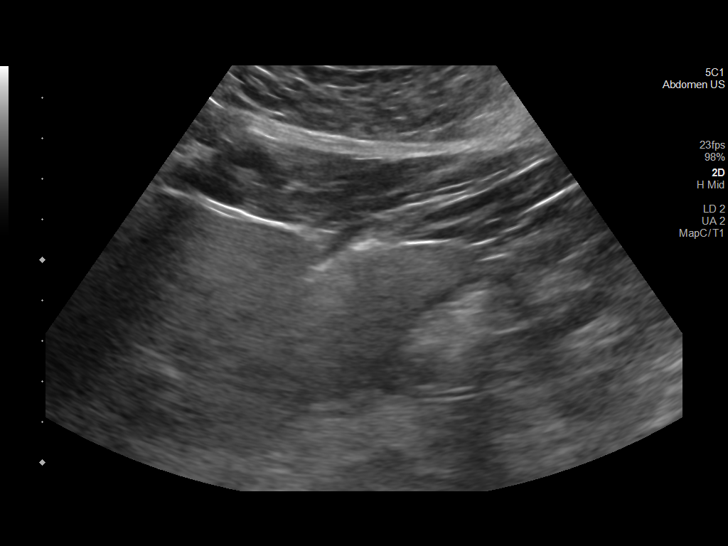
[im 7/13]
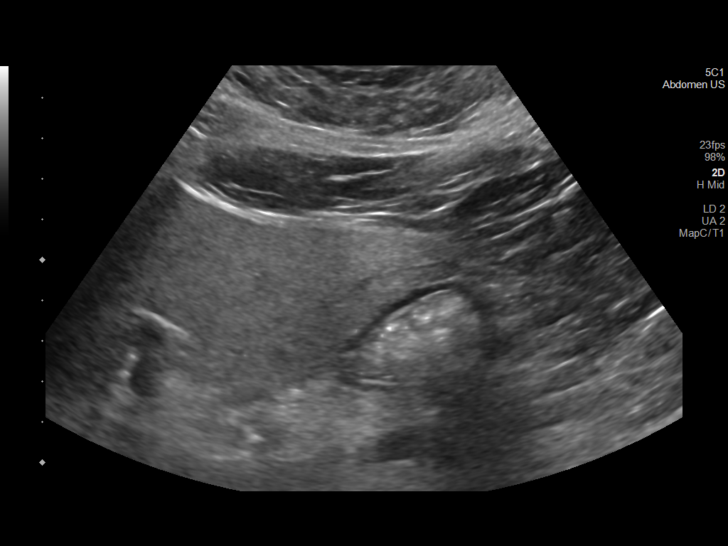
[im 8/13]
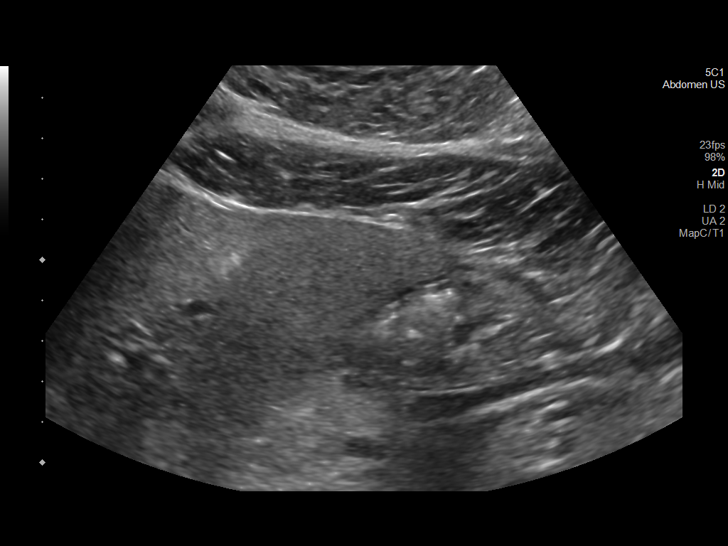
[im 9/13]
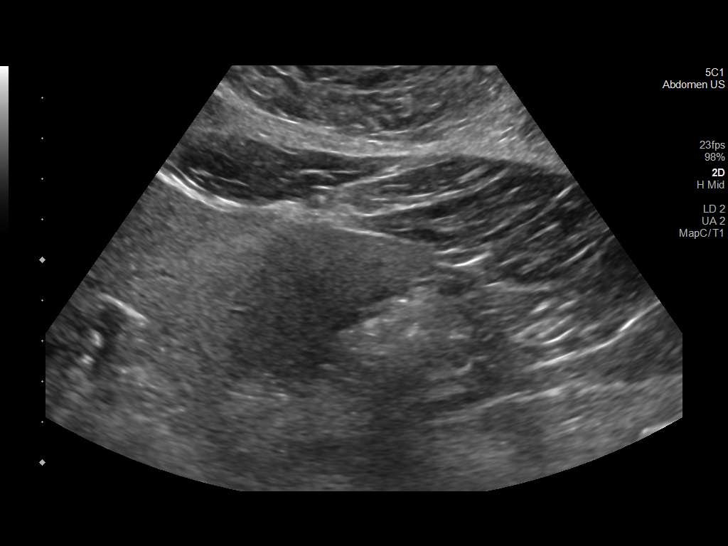
[im 10/13]
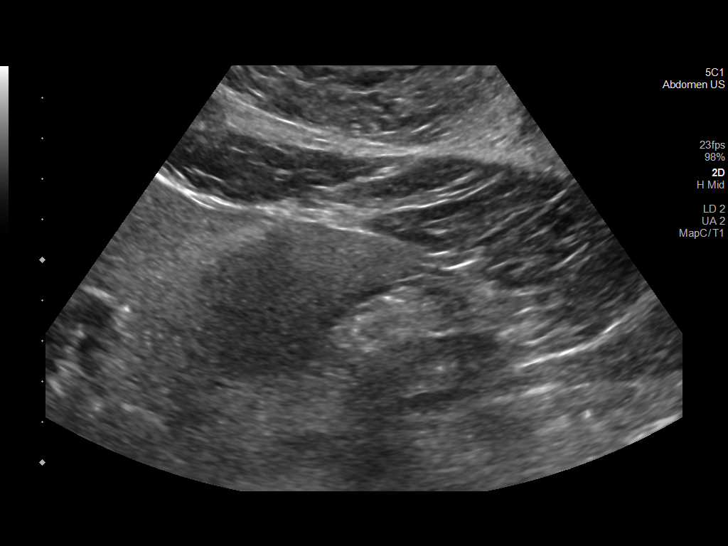
[im 11/13]
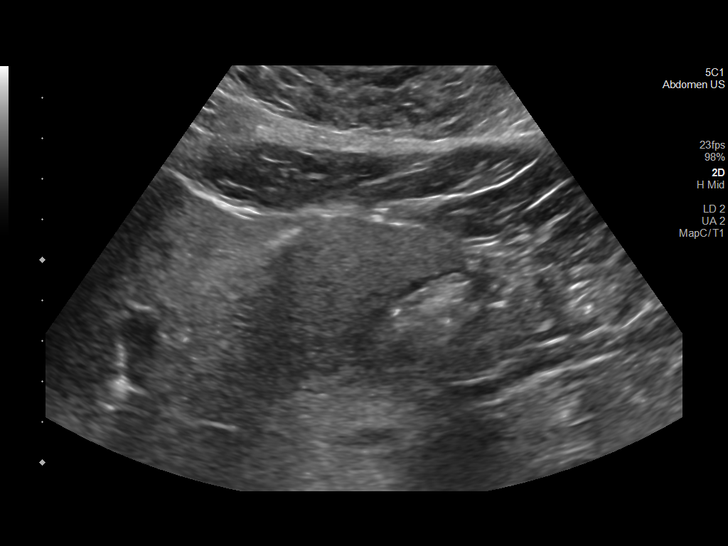
[im 12/13]
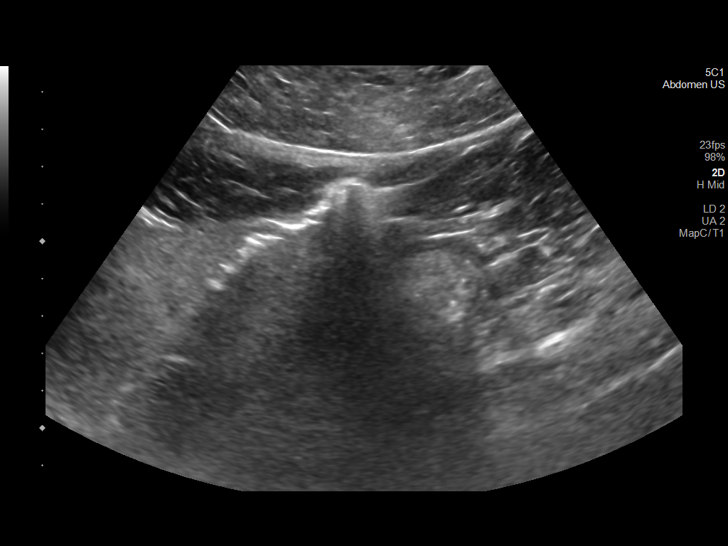
[im 13/13]
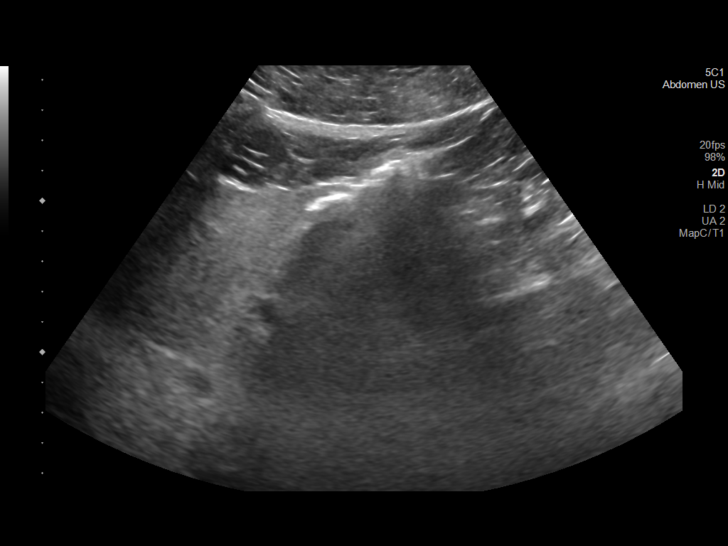

[13 of 13 positions shown; findings below may reference images not displayed]

Positive AMA

Possible primary biliary cirrhosis

EXAM:
Ultrasound-guided random liver biopsy

MEDICATIONS:
None.

ANESTHESIA/SEDATION:
Moderate (conscious) sedation was employed during this procedure. A
total of Versed 1.5 mg and Fentanyl 75 mcg was administered
intravenously.

Moderate Sedation Time: 11 minutes. The patient's level of
consciousness and vital signs were monitored continuously by
radiology nursing throughout the procedure under my direct
supervision.

COMPLICATIONS:
None immediate.

PROCEDURE:
Informed written consent was obtained from the patient after a
thorough discussion of the procedural risks, benefits and
alternatives. All questions were addressed. Maximal Sterile Barrier
Technique was utilized including caps, mask, sterile gowns, sterile
gloves, sterile drape, hand hygiene and skin antiseptic. A timeout
was performed prior to the initiation of the procedure.

Patient position supine on the ultrasound table.

Epigastric skin prepped and draped in usual sterile fashion.

Following local lidocaine administration, 17 gauge introducer needle
was advanced into the left hepatic lobe, and three 18 gauge cores
were obtained utilizing continuous ultrasound guidance.

Gelfoam slurry was administered through the introducer needle at the
biopsy site.

Samples were sent to pathology in formalin.

Needle removed and hemostasis achieved with 5 minutes of manual
compression.

Post procedure ultrasound images showed no evidence of significant
hemorrhage.
IMPRESSION: Ultrasound-guided random biopsy of left liver lobe as above.

## 2023-01-06 ENCOUNTER — Other Ambulatory Visit (HOSPITAL_BASED_OUTPATIENT_CLINIC_OR_DEPARTMENT_OTHER): Payer: Self-pay

## 2023-01-07 ENCOUNTER — Other Ambulatory Visit (HOSPITAL_BASED_OUTPATIENT_CLINIC_OR_DEPARTMENT_OTHER): Payer: Self-pay

## 2023-01-08 ENCOUNTER — Other Ambulatory Visit (HOSPITAL_BASED_OUTPATIENT_CLINIC_OR_DEPARTMENT_OTHER): Payer: Self-pay

## 2023-01-09 ENCOUNTER — Other Ambulatory Visit (HOSPITAL_BASED_OUTPATIENT_CLINIC_OR_DEPARTMENT_OTHER): Payer: Self-pay

## 2023-01-13 ENCOUNTER — Other Ambulatory Visit (HOSPITAL_BASED_OUTPATIENT_CLINIC_OR_DEPARTMENT_OTHER): Payer: Self-pay

## 2023-01-14 ENCOUNTER — Other Ambulatory Visit (HOSPITAL_BASED_OUTPATIENT_CLINIC_OR_DEPARTMENT_OTHER): Payer: Self-pay

## 2023-01-16 ENCOUNTER — Other Ambulatory Visit (HOSPITAL_BASED_OUTPATIENT_CLINIC_OR_DEPARTMENT_OTHER): Payer: Self-pay

## 2023-01-17 ENCOUNTER — Other Ambulatory Visit (HOSPITAL_BASED_OUTPATIENT_CLINIC_OR_DEPARTMENT_OTHER): Payer: Self-pay

## 2023-01-20 ENCOUNTER — Other Ambulatory Visit (HOSPITAL_BASED_OUTPATIENT_CLINIC_OR_DEPARTMENT_OTHER): Payer: Self-pay

## 2023-01-21 ENCOUNTER — Other Ambulatory Visit (HOSPITAL_BASED_OUTPATIENT_CLINIC_OR_DEPARTMENT_OTHER): Payer: Self-pay

## 2023-01-23 ENCOUNTER — Other Ambulatory Visit (HOSPITAL_BASED_OUTPATIENT_CLINIC_OR_DEPARTMENT_OTHER): Payer: Self-pay

## 2023-01-27 ENCOUNTER — Other Ambulatory Visit (HOSPITAL_BASED_OUTPATIENT_CLINIC_OR_DEPARTMENT_OTHER): Payer: Self-pay

## 2023-01-28 ENCOUNTER — Other Ambulatory Visit (HOSPITAL_BASED_OUTPATIENT_CLINIC_OR_DEPARTMENT_OTHER): Payer: Self-pay

## 2023-01-29 ENCOUNTER — Other Ambulatory Visit (HOSPITAL_BASED_OUTPATIENT_CLINIC_OR_DEPARTMENT_OTHER): Payer: Self-pay

## 2023-01-30 ENCOUNTER — Other Ambulatory Visit (HOSPITAL_BASED_OUTPATIENT_CLINIC_OR_DEPARTMENT_OTHER): Payer: Self-pay

## 2023-02-03 ENCOUNTER — Other Ambulatory Visit (HOSPITAL_BASED_OUTPATIENT_CLINIC_OR_DEPARTMENT_OTHER): Payer: Self-pay

## 2023-02-06 ENCOUNTER — Other Ambulatory Visit (HOSPITAL_BASED_OUTPATIENT_CLINIC_OR_DEPARTMENT_OTHER): Payer: Self-pay

## 2023-02-10 ENCOUNTER — Other Ambulatory Visit (HOSPITAL_BASED_OUTPATIENT_CLINIC_OR_DEPARTMENT_OTHER): Payer: Self-pay

## 2023-02-11 ENCOUNTER — Other Ambulatory Visit (HOSPITAL_BASED_OUTPATIENT_CLINIC_OR_DEPARTMENT_OTHER): Payer: Self-pay

## 2023-02-13 ENCOUNTER — Encounter: Payer: Self-pay | Admitting: Family Medicine

## 2023-02-17 ENCOUNTER — Other Ambulatory Visit (HOSPITAL_BASED_OUTPATIENT_CLINIC_OR_DEPARTMENT_OTHER): Payer: Self-pay

## 2023-02-18 ENCOUNTER — Other Ambulatory Visit (HOSPITAL_BASED_OUTPATIENT_CLINIC_OR_DEPARTMENT_OTHER): Payer: Self-pay

## 2023-02-20 ENCOUNTER — Encounter (HOSPITAL_BASED_OUTPATIENT_CLINIC_OR_DEPARTMENT_OTHER): Payer: Self-pay

## 2023-02-20 ENCOUNTER — Other Ambulatory Visit (HOSPITAL_BASED_OUTPATIENT_CLINIC_OR_DEPARTMENT_OTHER): Payer: Self-pay

## 2023-02-21 ENCOUNTER — Other Ambulatory Visit (HOSPITAL_BASED_OUTPATIENT_CLINIC_OR_DEPARTMENT_OTHER): Payer: Self-pay

## 2023-02-24 ENCOUNTER — Other Ambulatory Visit: Payer: Self-pay

## 2023-02-24 ENCOUNTER — Other Ambulatory Visit (HOSPITAL_BASED_OUTPATIENT_CLINIC_OR_DEPARTMENT_OTHER): Payer: Self-pay

## 2023-02-25 ENCOUNTER — Other Ambulatory Visit: Payer: Self-pay

## 2023-03-05 ENCOUNTER — Other Ambulatory Visit (HOSPITAL_BASED_OUTPATIENT_CLINIC_OR_DEPARTMENT_OTHER): Payer: Self-pay

## 2023-03-05 ENCOUNTER — Encounter: Payer: Self-pay | Admitting: Gastroenterology

## 2023-03-05 ENCOUNTER — Ambulatory Visit: Payer: Commercial Managed Care - PPO | Admitting: Gastroenterology

## 2023-03-05 ENCOUNTER — Other Ambulatory Visit (INDEPENDENT_AMBULATORY_CARE_PROVIDER_SITE_OTHER): Payer: Commercial Managed Care - PPO

## 2023-03-05 ENCOUNTER — Other Ambulatory Visit: Payer: Self-pay

## 2023-03-05 VITALS — BP 124/82 | HR 79 | Ht 66.0 in | Wt 246.0 lb

## 2023-03-05 DIAGNOSIS — K743 Primary biliary cirrhosis: Secondary | ICD-10-CM

## 2023-03-05 LAB — COMPREHENSIVE METABOLIC PANEL
ALT: 20 U/L (ref 0–35)
AST: 16 U/L (ref 0–37)
Albumin: 4.1 g/dL (ref 3.5–5.2)
Alkaline Phosphatase: 132 U/L — ABNORMAL HIGH (ref 39–117)
BUN: 15 mg/dL (ref 6–23)
CO2: 25 mEq/L (ref 19–32)
Calcium: 9.1 mg/dL (ref 8.4–10.5)
Chloride: 104 mEq/L (ref 96–112)
Creatinine, Ser: 0.63 mg/dL (ref 0.40–1.20)
GFR: 98.18 mL/min (ref >60.00)
Glucose, Bld: 98 mg/dL (ref 70–99)
Potassium: 4.2 mEq/L (ref 3.5–5.1)
Sodium: 138 mEq/L (ref 135–145)
Total Bilirubin: 0.4 mg/dL (ref 0.2–1.2)
Total Protein: 6.8 g/dL (ref 6.0–8.3)

## 2023-03-05 LAB — LIPID PANEL
Cholesterol: 171 mg/dL (ref 0–200)
HDL: 43 mg/dL (ref >39.00)
NonHDL: 127.68
Total CHOL/HDL Ratio: 4
Triglycerides: 287 mg/dL — ABNORMAL HIGH (ref 0.0–149.0)
VLDL: 57.4 mg/dL — ABNORMAL HIGH (ref 0.0–40.0)

## 2023-03-05 LAB — CBC WITH DIFFERENTIAL/PLATELET
Basophils Absolute: 0.1 10*3/uL (ref 0.0–0.1)
Basophils Relative: 1.1 % (ref 0.0–3.0)
Eosinophils Absolute: 0.2 10*3/uL (ref 0.0–0.7)
Eosinophils Relative: 2.1 % (ref 0.0–5.0)
HCT: 40.7 % (ref 36.0–46.0)
Hemoglobin: 13.7 g/dL (ref 12.0–15.0)
Lymphocytes Relative: 24 % (ref 12.0–46.0)
Lymphs Abs: 1.7 10*3/uL (ref 0.7–4.0)
MCHC: 33.6 g/dL (ref 30.0–36.0)
MCV: 88.1 fl (ref 78.0–100.0)
Monocytes Absolute: 0.4 10*3/uL (ref 0.1–1.0)
Monocytes Relative: 5.9 % (ref 3.0–12.0)
Neutro Abs: 4.9 10*3/uL (ref 1.4–7.7)
Neutrophils Relative %: 66.9 % (ref 43.0–77.0)
Platelets: 364 10*3/uL (ref 150.0–400.0)
RBC: 4.62 Mil/uL (ref 3.87–5.11)
RDW: 13.2 % (ref 11.5–15.5)
WBC: 7.3 10*3/uL (ref 4.0–10.5)

## 2023-03-05 LAB — VITAMIN D 25 HYDROXY (VIT D DEFICIENCY, FRACTURES): VITD: 29.01 ng/mL — ABNORMAL LOW (ref 30.00–100.00)

## 2023-03-05 LAB — LDL CHOLESTEROL, DIRECT: Direct LDL: 98 mg/dL

## 2023-03-05 NOTE — Patient Instructions (Addendum)
Your provider has requested that you go to the basement level for lab work before leaving today. Press "B" on the elevator. The lab is located at the first door on the left as you exit the elevator.  You have been scheduled for an abdominal ultrasound at [facility] (1st floor of hospital) on 03/06/23 at 8:00am. Please arrive 30 minutes prior to your appointment for registration. Make certain not to have anything to eat or drink 6 hours prior to your appointment. Should you need to reschedule your appointment, please contact radiology at [phone]. This test typically takes about 30 minutes to perform.  Continue pantoprazole 40 mg daily and Urso three times a day.  The [place] [facility] providers would like to encourage you to use [facility] to communicate with providers for non-urgent requests or questions.  Due to long hold times on the telephone, sending your provider a message by [facility] may be a faster and more efficient way to get a response.  Please allow 48 business hours for a response.  Please remember that this is for non-urgent requests.   Due to recent changes in healthcare laws, you may see the results of your imaging and laboratory studies on MyChart before your provider has had a chance to review them.  We understand that in some cases there may be results that are confusing or concerning to you. Not all laboratory results come back in the same time frame and the provider may be waiting for multiple results in order to interpret others.  Please give [name] 48 hours in order for your provider to thoroughly review all the results before contacting the office for clarification of your results.

## 2023-03-05 NOTE — Progress Notes (Signed)
Chief Complaint: FU  Referring Provider:  Mosie Lukes, MD      ASSESSMENT AND PLAN;   #1. GERD. Neg EGD 05/2021  #2. IBS-C. Neg colon 05/2021  #3. 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 -CBC, CMP, AFP, lipid profile, Vit D. -[name] abdo -FU appt with wt loss clinic (she will make appointment).  She will also get in touch with Dr. Charlett Blake regarding bone density scan. -FU in 1 yr.  -Rpt screening colonoscopy 05/2026    HPI:    Hailey Noble is a 57 y.o. 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  No problems  Doing great.  Her upper GI symptoms have resolved ever since she has stopped Wegovy.  Unfortunately has gained weight as below.  No heartburn with Protonix 40 mg p.o. daily.  No odynophagia or dysphagia.  She has been taking MiraLAX 17 g p.o. daily with resultant improvement in her constipation.  She has been drinking enough water.  Had negative colonoscopy except for fair prep as below  No alcohol.  No jaundice dark urine or pale stools   FH - colon ca-paternal aunt.  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 - Otherwise grossly normal colonoscopy to cecum. - No specimens collected. - Repeat colonoscopy in 5 years for screening purposes d/t quality  of preparation/previous history of polyp with 2-day prep. Earlier, if clinically indicated.  EGD 05/2021 - Gastritis. Bx- neg for HP - A few gastric polyps. Resected and retrieved x 3. Bx-fundic gland polyps - Neg SB bx for celiac   Colonoscopy 05/02/2016 - One 1 mm polyp in the cecum, removed with a cold snare. Resected and retrieved. - The examination was otherwise normal on direct and retroflexion views.  EGD 09/2011: Nl   Liver Bx 06/07/2021 A. LIVER, LEFT LOBE, NEEDLE CORE BIOPSY:  - Patchy portal-based inflammation and mild to moderate lobular  inflammation and loss of bile ducts;  - Minimal fibrosis (stage 0-1 of 4)  Past Medical History:  Diagnosis Date   Allergic state 12/08/2016   Allergy    Anxiety    Back pain    " i just have a weak loer back"    Fibroids    GERD (gastroesophageal reflux disease)    Hyperlipidemia 06/09/2017   Insulin resistance    Joint pain    Knee pain, right 03/10/2017   Plantar fasciitis    PONV (postoperative nausea and  vomiting)    just nausea    Shortness of breath    not only if  i exert myselg    Sinusitis    Sleep apnea    Sleep apnea in adult 12/26/2020   Vitamin D deficiency 11/22/2016   Vitamin D deficiency     Past Surgical History:  Procedure Laterality Date   ABDOMINAL HYST[place]CTOMY  09/2019   [place]   COLONOSCOPY  02-2009   with Henrene Pastor normal   CYSTOSCOPY N/A 10/12/2019   Procedure: CYSTOSCOPY;  Surgeon: Sherlyn Hay, DO;  Location: [facility];  Service: Gynecology;  Laterality: N/A;   ESOPHAGOGASTRODUODENOSCOPY  2012   GANGLION CYST EXCISION Right    right- wrist    NASAL SEPTUM SURGERY     NASAL SINUS SURGERY     TMJ ARTHROSCOPY     left   TOTAL LAPAROSCOPIC HYSTERECTOMY WITH BILATERAL SALPINGO OOPHORECTOMY Bilateral 10/12/2019   Procedure: TOTAL LAPAROSCOPIC HYSTERECTOMY WITH BILATERAL SALPINGO OOPHORECTOMY, Lysis Pelvic Adhseions ;  Surgeon: Sherlyn Hay, DO;  Location: [facility];  Service: Gynecology;  Laterality: Bilateral;    Family History  Problem Relation Age of Onset   Hypertension Father    Diabetes Father    Hyperlipidemia Father    Breast cancer Mother 25   Cancer Mother        breast   Thyroid disease Mother    Sleep apnea Mother    Colon cancer Paternal Grandfather        died at age 75   Cancer Paternal Grandfather        GI CA   Obesity Sister    Diabetes Sister    Hypertension Sister    Heart disease Maternal Grandmother        MI   Cancer Maternal Grandfather        lung, smoker   Diabetes Maternal Grandfather    Diabetes Paternal Grandmother    Heart disease Paternal Grandmother    Pancreatitis Sister    Esophageal cancer Neg Hx    Stomach cancer Neg Hx    Stroke Neg Hx    Colon polyps Neg Hx    Rectal cancer Neg Hx    Pancreatic cancer Neg Hx     Social History   Tobacco Use   Smoking status: Never   Smokeless tobacco: Never  Vaping Use   Vaping Use: Never used  Substance Use Topics   Alcohol use: Yes    Alcohol/week: 0.0 standard drinks of alcohol    Comment: rare   Drug use: No    Current Outpatient Medications  Medication Sig Dispense Refill   acetaminophen (TYLENOL) 500 MG tablet Take 500 mg by mouth every 6 (six) hours as needed for headache.     buPROPion (WELLBUTRIN) 75 MG tablet Take 1 tablet (75 mg total) by mouth daily. 90 tablet 1   Calcium Carbonate-Vitamin D (CALTRATE 600+D PO) Take 1,200 mg by mouth daily.     EPINEPHrine (EPIPEN 2-PAK) 0.3 mg/0.3 mL IJ SOAJ injection Inject 0.3 mg into the muscle as needed for anaphylaxis (repeat in 15 minutes if symptoms return seek care). 2 each 1   escitalopram (LEXAPRO) 10 MG tablet Take 1 tablet (10 mg total) by mouth daily. 90 tablet 0   fluticasone (FLONASE) 50 MCG/ACT nasal spray Place 2 sprays into both nostrils daily. 16 g 5   meloxicam (MOBIC) 15 MG tablet Take 1 tablet (15 mg total) by mouth daily. 45 tablet 1  metFORMIN  (GLUCOPHAGE-XR) 500 MG 24 hr tablet Take 1 tablet (500 mg total) by mouth daily with breakfast. 90 tablet 1   pantoprazole (PROTONIX) 40 MG tablet Take 1 tablet (40 mg total) by mouth daily. 90 tablet 4   Polyethyl Glycol-Propyl Glycol (SYSTANE) 0.4-0.3 % SOLN Place 1 drop into both eyes daily as needed (Allergies).     Polyethylene Glycol 3350 (MIRALAX PO) Take by mouth.     ursodiol (URSO FORTE) 500 MG tablet Take 1 tablet (500 mg total) by mouth 3 (three) times daily. 270 tablet 4   Vitamin D, Cholecalciferol, 25 MCG (1000 UT) TABS Take 4,000 units daily 60 tablet    No current facility-administered medications for this visit.    Allergies  Allergen Reactions   Coconut Fatty Acids Hives   Codeine Hives and Itching   Mango Flavor Hives   [name] Itching and Swelling   Oxycodone-Acetaminophen     rash on face and head, itching   Tussionex Pennkinetic Er [Hydrocod Poli-Chlorphe Poli Er]     rash on face and head, itching   Ultram [Tramadol] Other (See Comments)    Dizzy and low blood pressure.     Review of Systems:  Psychiatric/Behavioral: Has anxiety or depression     Physical Exam:    BP 124/82   Pulse 79   Ht '5\' 6"'$  (1.676 m)   Wt 246 lb (111.6 kg)   LMP 11/22/2018 (Approximate)   SpO2 96%   BMI 39.71 kg/m  Wt Readings from Last 3 Encounters:  03/05/23 246 lb (111.6 kg)  11/20/22 239 lb (108.4 kg)  11/12/22 239 lb (108.4 kg)   Constitutional:  Well-developed, in no acute distress. Psychiatric: Normal mood and affect. Behavior is normal. HEENT: Pupils normal.  Conjunctivae are normal. No scleral icterus. Cardiovascular: Normal rate, regular rhythm. No edema Pulmonary/chest: Effort normal and breath sounds normal. No wheezing, rales or rhonchi. Abdominal: Soft, nondistended.  Mild epigastric tenderness and mild left lower quadrant abdominal tenderness without rebound.. Bowel sounds active throughout. There are no masses palpable.  Liver is palpated 3 cm below the  costal margin. Neurological: Alert and oriented to person place and time. Skin: Skin is warm and dry. No rashes noted.  Data Reviewed: I have personally reviewed following labs and imaging studies  CBC:    Latest Ref Rng & Units 11/11/2022    3:53 PM 04/25/2022    9:47 AM 07/27/2021    9:06 AM  CBC  WBC 4.0 - 10.5 K/uL 8.7  7.5  7.0   Hemoglobin 12.0 - 15.0 g/dL 13.4  12.9  12.7   Hematocrit 36.0 - 46.0 % 40.1  39.7  38.0   Platelets 150.0 - 400.0 K/uL 364.0  337.0  358.0     CMP:    Latest Ref Rng & Units 11/11/2022    3:53 PM 04/25/2022    9:47 AM 07/25/2021    3:58 PM  CMP  Glucose 70 - 99 mg/dL 79  84    BUN 6 - 23 mg/dL 17  13    Creatinine 0.40 - 1.20 mg/dL 0.71  0.58    Sodium 135 - 145 mEq/L 138  139    Potassium 3.5 - 5.1 mEq/L 3.9  4.4    Chloride 96 - 112 mEq/L 102  104    CO2 19 - 32 mEq/L 30  28    Calcium 8.4 - 10.5 mg/dL 9.2  8.9    Total Protein 6.0 - 8.3 g/dL 6.7  6.9  6.6   Total Bilirubin 0.2 - 1.2 mg/dL 0.2  0.3  0.2   Alkaline Phos 39 - 117 U/L 132  142  125   AST 0 - 37 U/L '13  17  14   '$ ALT 0 - 35 U/L '15  20  17      '$ Carmell Austria, MD 03/05/2023, 8:40 AM  Cc: Mosie Lukes, MD

## 2023-03-06 ENCOUNTER — Ambulatory Visit (HOSPITAL_COMMUNITY): Payer: Commercial Managed Care - PPO

## 2023-03-06 LAB — AFP TUMOR MARKER: AFP-Tumor Marker: 1.2 ng/mL

## 2023-03-07 ENCOUNTER — Ambulatory Visit (HOSPITAL_COMMUNITY): Payer: Commercial Managed Care - PPO

## 2023-03-11 ENCOUNTER — Ambulatory Visit (INDEPENDENT_AMBULATORY_CARE_PROVIDER_SITE_OTHER): Payer: Commercial Managed Care - PPO

## 2023-03-11 DIAGNOSIS — K743 Primary biliary cirrhosis: Secondary | ICD-10-CM

## 2023-03-11 DIAGNOSIS — K7689 Other specified diseases of liver: Secondary | ICD-10-CM | POA: Diagnosis not present

## 2023-03-12 ENCOUNTER — Other Ambulatory Visit (HOSPITAL_BASED_OUTPATIENT_CLINIC_OR_DEPARTMENT_OTHER): Payer: Self-pay

## 2023-03-13 ENCOUNTER — Other Ambulatory Visit (HOSPITAL_BASED_OUTPATIENT_CLINIC_OR_DEPARTMENT_OTHER): Payer: Self-pay

## 2023-03-14 ENCOUNTER — Other Ambulatory Visit: Payer: Self-pay

## 2023-03-14 ENCOUNTER — Other Ambulatory Visit (HOSPITAL_BASED_OUTPATIENT_CLINIC_OR_DEPARTMENT_OTHER): Payer: Self-pay

## 2023-03-19 DIAGNOSIS — G4733 Obstructive sleep apnea (adult) (pediatric): Secondary | ICD-10-CM | POA: Diagnosis not present

## 2023-03-31 ENCOUNTER — Other Ambulatory Visit: Payer: Self-pay

## 2023-03-31 ENCOUNTER — Other Ambulatory Visit: Payer: Self-pay | Admitting: Family Medicine

## 2023-03-31 ENCOUNTER — Other Ambulatory Visit (HOSPITAL_BASED_OUTPATIENT_CLINIC_OR_DEPARTMENT_OTHER): Payer: Self-pay

## 2023-03-31 DIAGNOSIS — F418 Other specified anxiety disorders: Secondary | ICD-10-CM

## 2023-03-31 MED ORDER — ESCITALOPRAM OXALATE 10 MG PO TABS
10.0000 mg | ORAL_TABLET | Freq: Every day | ORAL | 0 refills | Status: DC
Start: 2023-03-31 — End: 2023-06-27
  Filled 2023-03-31: qty 90, 90d supply, fill #0

## 2023-04-07 ENCOUNTER — Encounter: Payer: Self-pay | Admitting: *Deleted

## 2023-04-19 DIAGNOSIS — G4733 Obstructive sleep apnea (adult) (pediatric): Secondary | ICD-10-CM | POA: Diagnosis not present

## 2023-05-19 DIAGNOSIS — G4733 Obstructive sleep apnea (adult) (pediatric): Secondary | ICD-10-CM | POA: Diagnosis not present

## 2023-06-02 ENCOUNTER — Other Ambulatory Visit (HOSPITAL_BASED_OUTPATIENT_CLINIC_OR_DEPARTMENT_OTHER): Payer: Self-pay

## 2023-06-02 ENCOUNTER — Other Ambulatory Visit: Payer: Self-pay | Admitting: Family Medicine

## 2023-06-02 MED ORDER — BUPROPION HCL 75 MG PO TABS
75.0000 mg | ORAL_TABLET | Freq: Every day | ORAL | 1 refills | Status: DC
  Filled 2023-06-02: qty 90, 90d supply, fill #0
  Filled 2023-08-29: qty 90, 90d supply, fill #1

## 2023-06-09 ENCOUNTER — Other Ambulatory Visit: Payer: Self-pay | Admitting: Family Medicine

## 2023-06-09 ENCOUNTER — Other Ambulatory Visit (HOSPITAL_BASED_OUTPATIENT_CLINIC_OR_DEPARTMENT_OTHER): Payer: Self-pay

## 2023-06-09 MED ORDER — METFORMIN HCL ER 500 MG PO TB24
500.0000 mg | ORAL_TABLET | Freq: Every day | ORAL | 0 refills | Status: DC
  Filled 2023-06-09: qty 90, 90d supply, fill #0

## 2023-06-17 DIAGNOSIS — G4733 Obstructive sleep apnea (adult) (pediatric): Secondary | ICD-10-CM | POA: Diagnosis not present

## 2023-06-24 DIAGNOSIS — H524 Presbyopia: Secondary | ICD-10-CM | POA: Diagnosis not present

## 2023-06-24 DIAGNOSIS — H04123 Dry eye syndrome of bilateral lacrimal glands: Secondary | ICD-10-CM | POA: Diagnosis not present

## 2023-06-24 DIAGNOSIS — H2513 Age-related nuclear cataract, bilateral: Secondary | ICD-10-CM | POA: Diagnosis not present

## 2023-06-24 DIAGNOSIS — H5201 Hypermetropia, right eye: Secondary | ICD-10-CM | POA: Diagnosis not present

## 2023-06-24 DIAGNOSIS — H52222 Regular astigmatism, left eye: Secondary | ICD-10-CM | POA: Diagnosis not present

## 2023-06-27 ENCOUNTER — Other Ambulatory Visit (HOSPITAL_BASED_OUTPATIENT_CLINIC_OR_DEPARTMENT_OTHER): Payer: Self-pay

## 2023-06-27 ENCOUNTER — Other Ambulatory Visit: Payer: Self-pay

## 2023-06-27 ENCOUNTER — Other Ambulatory Visit: Payer: Self-pay | Admitting: Family Medicine

## 2023-06-27 DIAGNOSIS — F418 Other specified anxiety disorders: Secondary | ICD-10-CM

## 2023-06-27 MED ORDER — ESCITALOPRAM OXALATE 10 MG PO TABS
10.0000 mg | ORAL_TABLET | Freq: Every day | ORAL | 1 refills | Status: DC
Start: 2023-06-27 — End: 2023-12-22
  Filled 2023-06-27: qty 90, 90d supply, fill #0
  Filled 2023-09-23: qty 90, 90d supply, fill #1

## 2023-07-09 ENCOUNTER — Other Ambulatory Visit: Payer: Self-pay | Admitting: Family Medicine

## 2023-07-09 ENCOUNTER — Other Ambulatory Visit (HOSPITAL_BASED_OUTPATIENT_CLINIC_OR_DEPARTMENT_OTHER): Payer: Self-pay

## 2023-07-09 MED ORDER — FLUTICASONE PROPIONATE 50 MCG/ACT NA SUSP: 2.0000 | Freq: Every day | NASAL | 5 refills | Status: DC

## 2023-07-17 DIAGNOSIS — G4733 Obstructive sleep apnea (adult) (pediatric): Secondary | ICD-10-CM | POA: Diagnosis not present

## 2023-08-17 DIAGNOSIS — G4733 Obstructive sleep apnea (adult) (pediatric): Secondary | ICD-10-CM | POA: Diagnosis not present

## 2023-08-29 ENCOUNTER — Other Ambulatory Visit (HOSPITAL_BASED_OUTPATIENT_CLINIC_OR_DEPARTMENT_OTHER): Payer: Self-pay

## 2023-09-08 ENCOUNTER — Other Ambulatory Visit: Payer: Self-pay | Admitting: Family Medicine

## 2023-09-08 ENCOUNTER — Other Ambulatory Visit (HOSPITAL_BASED_OUTPATIENT_CLINIC_OR_DEPARTMENT_OTHER): Payer: Self-pay

## 2023-09-08 MED ORDER — METFORMIN HCL ER 500 MG PO TB24
500.0000 mg | ORAL_TABLET | Freq: Every day | ORAL | 0 refills | Status: DC
  Filled 2023-09-08: qty 90, 90d supply, fill #0

## 2023-09-15 DIAGNOSIS — G4733 Obstructive sleep apnea (adult) (pediatric): Secondary | ICD-10-CM | POA: Diagnosis not present

## 2023-10-08 ENCOUNTER — Other Ambulatory Visit (HOSPITAL_BASED_OUTPATIENT_CLINIC_OR_DEPARTMENT_OTHER): Payer: Self-pay

## 2023-10-15 DIAGNOSIS — G4733 Obstructive sleep apnea (adult) (pediatric): Secondary | ICD-10-CM | POA: Diagnosis not present

## 2023-11-13 ENCOUNTER — Encounter: Payer: Commercial Managed Care - PPO | Admitting: Family Medicine

## 2023-11-15 DIAGNOSIS — G4733 Obstructive sleep apnea (adult) (pediatric): Secondary | ICD-10-CM | POA: Diagnosis not present

## 2023-11-16 NOTE — Assessment & Plan Note (Signed)
Supplement and monitor

## 2023-11-16 NOTE — Assessment & Plan Note (Signed)
Patient encouraged to maintain heart healthy diet, regular exercise, adequate sleep. Consider daily probiotics. Take medications as prescribed  Labs ordered and reviewed  Colonoscopy 2022 repeat in 2027 [facility] 11/2022 repeat every 1-2 years Pap at [facility], sees Lisbeth Ply, NP, had a TAH 09/2019 now no longer does paps just pelvics in March of 2023 Covid and flu boosters

## 2023-11-16 NOTE — Assessment & Plan Note (Signed)
Encourage heart healthy diet such as MIND or DASH diet, increase exercise, avoid trans fats, simple carbohydrates and processed foods, consider a krill or fish or flaxseed oil cap daily.  °

## 2023-11-16 NOTE — Assessment & Plan Note (Signed)
hgba1c acceptable, minimize simple carbs. Increase exercise as tolerated.  

## 2023-11-16 NOTE — Assessment & Plan Note (Signed)
Encouraged DASH or MIND diet, decrease po intake and increase exercise as tolerated. Needs 7-8 hours of sleep nightly. Avoid trans fats, eat small, frequent meals every 4-5 hours with lean proteins, complex carbs and healthy fats. Minimize simple carbs, high fat foods and processed foods 

## 2023-11-17 ENCOUNTER — Encounter: Payer: Self-pay | Admitting: *Deleted

## 2023-11-17 ENCOUNTER — Ambulatory Visit: Payer: Commercial Managed Care - PPO | Admitting: Medical

## 2023-11-17 ENCOUNTER — Other Ambulatory Visit (HOSPITAL_BASED_OUTPATIENT_CLINIC_OR_DEPARTMENT_OTHER): Payer: Self-pay

## 2023-11-17 VITALS — BP 128/62 | HR 82 | Temp 98.2°F | Resp 18 | Ht 66.0 in | Wt 254.0 lb

## 2023-11-17 DIAGNOSIS — H9201 Otalgia, right ear: Secondary | ICD-10-CM | POA: Diagnosis not present

## 2023-11-17 MED ORDER — BENZONATATE 100 MG PO CAPS
100.0000 mg | ORAL_CAPSULE | Freq: Three times a day (TID) | ORAL | 0 refills | Status: DC | PRN
  Filled 2023-11-17: qty 30, 10d supply, fill #0

## 2023-11-17 MED ORDER — AMOXICILLIN-POT CLAVULANATE 875-125 MG PO TABS
1.0000 | ORAL_TABLET | Freq: Two times a day (BID) | ORAL | 0 refills | Status: DC
  Filled 2023-11-17: qty 20, 10d supply, fill #0

## 2023-11-17 MED ORDER — FLUTICASONE PROPIONATE 50 MCG/ACT NA SUSP
2.0000 | Freq: Every day | NASAL | 1 refills | Status: DC
  Filled 2023-11-17: qty 16, 30d supply, fill #0
  Filled 2023-12-10 – 2023-12-11 (×2): qty 16, 30d supply, fill #1

## 2023-11-17 MED ORDER — ALBUTEROL SULFATE HFA 108 (90 BASE) MCG/ACT IN AERS
2.0000 | INHALATION_SPRAY | Freq: Four times a day (QID) | RESPIRATORY_TRACT | 0 refills | Status: DC | PRN
  Filled 2023-11-17: qty 6.7, 25d supply, fill #0

## 2023-11-17 NOTE — Patient Instructions (Signed)
Early onset ear infection and sinus infection(approaching holiday thursday) Rapid onset of symptoms including ear fullness, nasal congestion, cough with yellow/green sputum, and sore throat. No fever. Negative home COVID tests. Physical exam reveals potential early ear infections and maxillary sinus infection. -Start Augmentin antibiotic. -Continue use of Flonase and saline spray.  Bronchitis Patient reports wheezing and shortness of breath, but lungs sound clear on exam. -Prescribe Albuterol inhaler for use as needed. -Prescribe Benzonatate for cough.  Sleep Apnea Patient reports using CPAP regularly. -Continue current management.  Follow up date depending on how you respond to treatment.

## 2023-11-17 NOTE — Progress Notes (Unsigned)
Subjective:    Patient ID: Hailey Noble, female    DOB: 03-14-1966, 57 y.o.   MRN: [id number]  HPI Discussed the use of AI scribe software for clinical note transcription with the patient, who gave verbal consent to proceed.  History of Present Illness   The patient, with a history of sleep apnea, presented with symptoms that began on Saturday, rapidly progressing from a sensation of right ear fullness and head congestion to a loss of voice and coughing. The patient described the cough as productive with yellow and green sputum. She also reported a sensation of chest congestion and wheezing, which she had not previously experienced with illnesses. The patient denied any history of reactive airways or asthma.  The patient described a sensation of fullness and tightness in the right ear, but denied significant pain. She reported discomfort when swallowing, likening it to the sensation of a razor blade. She also reported nasal congestion and pain in the area above her teeth and through the maxillary sinus region.  The patient denied experiencing fevers, but reported feeling achy and having chills on the previous day. She had tested negative for COVID-19 at home on three separate occasions and had received the flu vaccine. Despite these preventative measures, the patient's symptoms had accelerated rapidly, causing concern due to the upcoming short holiday week.       Past Medical History:  Diagnosis Date   Allergic state 12/08/2016   Allergy    Anxiety    Back pain    " i just have a weak loer back"    Fibroids    GERD (gastroesophageal reflux disease)    Hyperlipidemia 06/09/2017   Insulin resistance    Joint pain    Knee pain, right 03/10/2017   Plantar fasciitis    PONV (postoperative nausea and vomiting)    just nausea    Shortness of breath    not only if  i exert myselg    Sinusitis    Sleep apnea    Sleep apnea in adult 12/26/2020   Vitamin D deficiency 11/22/2016   Vitamin  D deficiency      Social History   Socioeconomic History   Marital status: Married    Spouse name: Onalee Hua   Number of children: Not on file   Years of education: Not on file   Highest education level: Not on file  Occupational History   Occupation: Teacher, adult education: [employer]  Tobacco Use   Smoking status: Never   Smokeless tobacco: Never  Vaping Use   Vaping status: Never Used  Substance and Sexual Activity   Alcohol use: Yes    Alcohol/week: 0.0 standard drinks of alcohol    Comment: rare   Drug use: No   Sexual activity: Yes    Birth control/protection: I.U.D.  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 Works at [employer] center. No dietary restrictions. Lives with dog      Right Handed   1 cup of Coffee per Day   Social Determinants of Health   Financial Resource Strain: Not on file  Food Insecurity: Not on file  Transportation Needs: Not on file  Physical Activity: Not on file  Stress: Not on file  Social Connections: Not on file  Intimate Partner Violence: Not on file    Past Surgical History:  Procedure Laterality Date   ABDOMINAL  HYST[place]CTOMY  09/2019   [place] [facility]   COLONOSCOPY  02-2009   with Marina Goodell normal   CYSTOSCOPY N/A 10/12/2019   Procedure: CYSTOSCOPY;  Surgeon: Edwinna Areola, DO;  Location: [facility];  Service: Gynecology;  Laterality: N/A;   ESOPHAGOGASTRODUODENOSCOPY  2012   GANGLION CYST EXCISION Right    right- wrist    NASAL SEPTUM SURGERY     NASAL SINUS SURGERY     TMJ ARTHROSCOPY     left   TOTAL LAPAROSCOPIC HYSTERECTOMY WITH BILATERAL SALPINGO OOPHORECTOMY Bilateral 10/12/2019   Procedure: TOTAL LAPAROSCOPIC HYSTERECTOMY WITH BILATERAL SALPINGO OOPHORECTOMY, Lysis Pelvic Adhseions ;  Surgeon: Edwinna Areola, DO;  Location: [facility];  Service: Gynecology;  Laterality: Bilateral;    Family History   Problem Relation Age of Onset   Hypertension Father    Diabetes Father    Hyperlipidemia Father    Breast cancer Mother 44   Cancer Mother        breast   Thyroid disease Mother    Sleep apnea Mother    Colon cancer Paternal Grandfather        died at age 51   Cancer Paternal Grandfather        GI CA   Obesity Sister    Diabetes Sister    Hypertension Sister    Heart disease Maternal Grandmother        MI   Cancer Maternal Grandfather        lung, smoker   Diabetes Maternal Grandfather    Diabetes Paternal Grandmother    Heart disease Paternal Grandmother    Pancreatitis Sister    Esophageal cancer Neg Hx    Stomach cancer Neg Hx    Stroke Neg Hx    Colon polyps Neg Hx    Rectal cancer Neg Hx    Pancreatic cancer Neg Hx     Allergies  Allergen Reactions   Coconut Fatty Acid Hives   Codeine Hives and Itching   Mango Flavor Hives   [name] Itching and Swelling   Oxycodone-Acetaminophen     rash on face and head, itching   Tussionex Pennkinetic Er [Hydrocod Poli-Chlorphe Poli Er]     rash on face and head, itching   Ultram [Tramadol] Other (See Comments)    Dizzy and low blood pressure.     Current Outpatient Medications on File Prior to Visit  Medication Sig Dispense Refill   acetaminophen (TYLENOL) 500 MG tablet Take 500 mg by mouth every 6 (six) hours as needed for headache.     buPROPion (WELLBUTRIN) 75 MG tablet Take 1 tablet (75 mg total) by mouth daily. 90 tablet 1   Calcium Carbonate-Vitamin D (CALTRATE 600+D PO) Take 1,200 mg by mouth daily.     EPINEPHrine (EPIPEN 2-PAK) 0.3 mg/0.3 mL IJ SOAJ injection Inject 0.3 mg into the muscle as needed for anaphylaxis (repeat in 15 minutes if symptoms return seek care). 2 each 1   escitalopram (LEXAPRO) 10 MG tablet Take 1 tablet (10 mg total) by mouth daily. 90 tablet 1   fluticasone (FLONASE) 50 MCG/ACT nasal spray Place 2 sprays into both nostrils daily. 16 g 5   metFORMIN (GLUCOPHAGE-XR) 500 MG 24 hr tablet  Take 1 tablet (500 mg total) by mouth daily with breakfast. *Need appointment for refills.* 90 tablet 0   pantoprazole (PROTONIX) 40 MG tablet Take 1 tablet (40 mg total) by mouth daily. 90 tablet 4   Polyethylene Glycol 3350 (MIRALAX PO) Take by  mouth.     Vitamin D, Cholecalciferol, 25 MCG (1000 UT) TABS Take 4,000 units daily 60 tablet    No current facility-administered medications on file prior to visit.    BP 128/62   Pulse 82   Temp 98.2 F (36.8 C)   Resp 18   Ht 5\' 6"  (1.676 m)   Wt 254 lb (115.2 kg)   LMP 11/22/2018 (Approximate)   SpO2 95%   BMI 41.00 kg/m       Review of Systems  Constitutional:  Negative for fatigue and fever.  HENT:  Positive for congestion, ear pain, sinus pressure, sinus pain and sore throat.   Cardiovascular:  Negative for chest pain and palpitations.  Gastrointestinal:  Negative for abdominal pain.  Musculoskeletal:  Negative for back pain, joint swelling, myalgias and neck stiffness.  Skin:  Negative for rash.  Neurological:  Negative for dizziness, seizures, weakness and headaches.  Hematological:  Negative for adenopathy. Does not bruise/bleed easily.  Psychiatric/Behavioral:  Negative for behavioral problems, confusion and decreased concentration.        Objective:   Physical Exam  General- No acute distress. Pleasant patient. Neck- Full range of motion, no jvd Lungs- Clear, even and unlabored. Heart- regular rate and rhythm. Neurologic- CNII- XII grossly intact.  Heent- frontal and maxillary sinus pressure. Ears- bilateral central redness to both tms. Boggy turbinates. Faint posterior pharynx redess.  HEENT: Right external auditory canal  partially obstructed with wax. Tympanic membrane with red streak in middle with noperforation.  Mild Pharyngeal erythema. Left tympanic membrane with slight erythema. Mild maxilary sinus tenderness. CHEST: Lungs clear to auscultation.    Assessment & Plan:   Early onset ear infection and  sinus infection(approaching holiday thursday) Rapid onset of symptoms including ear fullness, nasal congestion, cough with yellow/green sputum, and sore throat. No fever. Negative home COVID tests. Physical exam reveals potential early ear infections and maxillary sinus infection. -Start Augmentin antibiotic. -Continue use of Flonase and saline spray.  Bronchitis Patient reports wheezing and shortness of breath, but lungs sound clear on exam. -Prescribe Albuterol inhaler for use as needed. -Prescribe Benzonatate for cough.  Sleep Apnea Patient reports using CPAP regularly. -Continue current management.  Follow up date depending on how you respond to treatment.  Esperanza Richters, PA-C

## 2023-11-18 ENCOUNTER — Ambulatory Visit (INDEPENDENT_AMBULATORY_CARE_PROVIDER_SITE_OTHER): Payer: Commercial Managed Care - PPO | Admitting: Family Medicine

## 2023-11-18 ENCOUNTER — Encounter: Payer: Self-pay | Admitting: Family Medicine

## 2023-11-18 ENCOUNTER — Other Ambulatory Visit (HOSPITAL_BASED_OUTPATIENT_CLINIC_OR_DEPARTMENT_OTHER): Payer: Self-pay

## 2023-11-18 ENCOUNTER — Telehealth (INDEPENDENT_AMBULATORY_CARE_PROVIDER_SITE_OTHER): Payer: Commercial Managed Care - PPO | Admitting: Adult Health

## 2023-11-18 VITALS — BP 150/92 | HR 97 | Temp 98.3°F | Resp 18 | Ht 65.0 in | Wt 254.8 lb

## 2023-11-18 DIAGNOSIS — E66813 Obesity, class 3: Secondary | ICD-10-CM

## 2023-11-18 DIAGNOSIS — Z0000 Encounter for general adult medical examination without abnormal findings: Secondary | ICD-10-CM | POA: Diagnosis not present

## 2023-11-18 DIAGNOSIS — R0600 Dyspnea, unspecified: Secondary | ICD-10-CM

## 2023-11-18 DIAGNOSIS — Z6841 Body Mass Index (BMI) 40.0 and over, adult: Secondary | ICD-10-CM | POA: Diagnosis not present

## 2023-11-18 DIAGNOSIS — E559 Vitamin D deficiency, unspecified: Secondary | ICD-10-CM

## 2023-11-18 DIAGNOSIS — G4733 Obstructive sleep apnea (adult) (pediatric): Secondary | ICD-10-CM

## 2023-11-18 DIAGNOSIS — R739 Hyperglycemia, unspecified: Secondary | ICD-10-CM | POA: Diagnosis not present

## 2023-11-18 DIAGNOSIS — E7849 Other hyperlipidemia: Secondary | ICD-10-CM

## 2023-11-18 MED ORDER — FAMOTIDINE 10 MG PO TABS: 10.0000 mg | ORAL_TABLET | Freq: Every day | ORAL | Status: DC

## 2023-11-18 MED ORDER — PANTOPRAZOLE SODIUM 40 MG PO TBEC: 40.0000 mg | DELAYED_RELEASE_TABLET | Freq: Every day | ORAL | Status: DC | PRN

## 2023-11-18 MED ORDER — EPINEPHRINE 0.3 MG/0.3ML IJ SOAJ
0.3000 mg | INTRAMUSCULAR | 1 refills | Status: AC | PRN
  Filled 2023-11-18: qty 2, 9d supply, fill #0

## 2023-11-18 MED ORDER — KRILL OIL 500 MG PO CAPS: 2.0000 | ORAL_CAPSULE | Freq: Every day | ORAL | Status: DC

## 2023-11-18 NOTE — Patient Instructions (Addendum)
Recommend calcium intake of 1200 to 1500 mg daily, divided into roughly 3 doses. Best source is the diet and a single dairy serving is about 500 mg, a supplement of calcium citrate once or twice daily to balance diet is fine if not getting enough in diet. Also need Vitamin D 2000 IU caps, 1 cap daily if not already taking vitamin D. Also recommend weight baring exercise on hips and upper body to keep bones strong   Netflix Live to 100 the Secrets of the Blue Zones   4000, 8000 steps  60-80 ounces of fluids, ideally 10 ounces every 1-2 hours    Novarax is the new covid vaccine  Protein intake every 3-4 hours  Preventive Care 28-45 Years Old, Female Preventive care refers to lifestyle choices and visits with your health care provider that can promote health and wellness. Preventive care visits are also called wellness exams. What can I expect for my preventive care visit? Counseling Your health care provider may ask you questions about your: Medical history, including: Past medical problems. Family medical history. Pregnancy history. Current health, including: Menstrual cycle. Method of birth control. Emotional well-being. Home life and relationship well-being. Sexual activity and sexual health. Lifestyle, including: Alcohol, nicotine or tobacco, and drug use. Access to firearms. Diet, exercise, and sleep habits. Work and work Astronomer. Sunscreen use. Safety issues such as seatbelt and bike helmet use. Physical exam Your health care provider will check your: Height and weight. These may be used to calculate your BMI (body mass index). BMI is a measurement that tells if you are at a healthy weight. Waist circumference. This measures the distance around your waistline. This measurement also tells if you are at a healthy weight and may help predict your risk of certain diseases, such as type 2 diabetes and high blood pressure. Heart rate and blood pressure. Body  temperature. Skin for abnormal spots. What immunizations do I need?  Vaccines are usually given at various ages, according to a schedule. Your health care provider will recommend vaccines for you based on your age, medical history, and lifestyle or other factors, such as travel or where you work. What tests do I need? Screening Your health care provider may recommend screening tests for certain conditions. This may include: Lipid and cholesterol levels. Diabetes screening. This is done by checking your blood sugar (glucose) after you have not eaten for a while (fasting). Pelvic exam and Pap test. Hepatitis B test. Hepatitis C test. HIV (human immunodeficiency virus) test. STI (sexually transmitted infection) testing, if you are at risk. Lung cancer screening. Colorectal cancer screening. Mammogram. Talk with your health care provider about when you should start having regular mammograms. This may depend on whether you have a family history of breast cancer. BRCA-related cancer screening. This may be done if you have a family history of breast, ovarian, tubal, or peritoneal cancers. Bone density scan. This is done to screen for osteoporosis. Talk with your health care provider about your test results, treatment options, and if necessary, the need for more tests. Follow these instructions at home: Eating and drinking  Eat a diet that includes fresh fruits and vegetables, whole grains, lean protein, and low-fat dairy products. Take vitamin and mineral supplements as recommended by your health care provider. Do not drink alcohol if: Your health care provider tells you not to drink. You are pregnant, may be pregnant, or are planning to become pregnant. If you drink alcohol: Limit how much you have to 0-1 drink a  day. Know how much alcohol is in your drink. In the [place] one drink equals one 12 oz bottle of beer (355 mL), one 5 oz glass of wine (148 mL), or one 1 oz glass of hard liquor (44  mL). Lifestyle Brush your teeth every morning and night with fluoride toothpaste. Floss one time each day. Exercise for at least 30 minutes 5 or more days each week. Do not use any products that contain nicotine or tobacco. These products include cigarettes, chewing tobacco, and vaping devices, such as e-cigarettes. If you need help quitting, ask your health care provider. Do not use drugs. If you are sexually active, practice safe sex. Use a condom or other form of protection to prevent STIs. If you do not wish to become pregnant, use a form of birth control. If you plan to become pregnant, see your health care provider for a prepregnancy visit. Take aspirin only as told by your health care provider. Make sure that you understand how much to take and what form to take. Work with your health care provider to find out whether it is safe and beneficial for you to take aspirin daily. Find healthy ways to manage stress, such as: Meditation, yoga, or listening to music. Journaling. Talking to a trusted person. Spending time with friends and family. Minimize exposure to UV radiation to reduce your risk of skin cancer. Safety Always wear your seat belt while driving or riding in a vehicle. Do not drive: If you have been drinking alcohol. Do not ride with someone who has been drinking. When you are tired or distracted. While texting. If you have been using any mind-altering substances or drugs. Wear a helmet and other protective equipment during sports activities. If you have firearms in your house, make sure you follow all gun safety procedures. Seek help if you have been physically or sexually abused. What's next? Visit your health care provider once a year for an annual wellness visit. Ask your health care provider how often you should have your eyes and teeth checked. Stay up to date on all vaccines. This information is not intended to replace advice given to you by your health care  provider. Make sure you discuss any questions you have with your health care provider. Document Revised: 06/06/2021 Document Reviewed: 06/06/2021 Elsevier Patient Education  2024 [employer].

## 2023-11-19 ENCOUNTER — Ambulatory Visit: Payer: Commercial Managed Care - PPO

## 2023-11-19 ENCOUNTER — Other Ambulatory Visit: Payer: Self-pay | Admitting: *Deleted

## 2023-11-19 DIAGNOSIS — R748 Abnormal levels of other serum enzymes: Secondary | ICD-10-CM | POA: Diagnosis not present

## 2023-11-19 LAB — VITAMIN D 25 HYDROXY (VIT D DEFICIENCY, FRACTURES): VITD: 33.12 ng/mL (ref 30.00–100.00)

## 2023-11-19 LAB — CBC WITH DIFFERENTIAL/PLATELET
Basophils Absolute: 0.1 10*3/uL (ref 0.0–0.1)
Basophils Relative: 1 % (ref 0.0–3.0)
Eosinophils Absolute: 0.2 10*3/uL (ref 0.0–0.7)
Eosinophils Relative: 1.7 % (ref 0.0–5.0)
HCT: 40.6 % (ref 36.0–46.0)
Hemoglobin: 13.4 g/dL (ref 12.0–15.0)
Lymphocytes Relative: 17 % (ref 12.0–46.0)
Lymphs Abs: 2 10*3/uL (ref 0.7–4.0)
MCHC: 33.1 g/dL (ref 30.0–36.0)
MCV: 89.4 fL (ref 78.0–100.0)
Monocytes Absolute: 0.9 10*3/uL (ref 0.1–1.0)
Monocytes Relative: 7.6 % (ref 3.0–12.0)
Neutro Abs: 8.4 10*3/uL — ABNORMAL HIGH (ref 1.4–7.7)
Neutrophils Relative %: 72.7 % (ref 43.0–77.0)
Platelets: 359 10*3/uL (ref 150.0–400.0)
RBC: 4.54 Mil/uL (ref 3.87–5.11)
RDW: 13.6 % (ref 11.5–15.5)
WBC: 11.5 10*3/uL — ABNORMAL HIGH (ref 4.0–10.5)

## 2023-11-19 LAB — LIPID PANEL
Cholesterol: 163 mg/dL (ref 0–200)
HDL: 36.9 mg/dL — ABNORMAL LOW (ref >39.00)
LDL Cholesterol: 73 mg/dL (ref 0–99)
NonHDL: 126.06
Total CHOL/HDL Ratio: 4
Triglycerides: 263 mg/dL — ABNORMAL HIGH (ref 0.0–149.0)
VLDL: 52.6 mg/dL — ABNORMAL HIGH (ref 0.0–40.0)

## 2023-11-19 LAB — COMPREHENSIVE METABOLIC PANEL
ALT: 18 U/L (ref 0–35)
AST: 13 U/L (ref 0–37)
Albumin: 4.4 g/dL (ref 3.5–5.2)
Alkaline Phosphatase: 148 U/L — ABNORMAL HIGH (ref 39–117)
BUN: 12 mg/dL (ref 6–23)
CO2: 30 meq/L (ref 19–32)
Calcium: 9.6 mg/dL (ref 8.4–10.5)
Chloride: 103 meq/L (ref 96–112)
Creatinine, Ser: 0.7 mg/dL (ref 0.40–1.20)
GFR: 95.25 mL/min (ref >60.00)
Glucose, Bld: 90 mg/dL (ref 70–99)
Potassium: 3.9 meq/L (ref 3.5–5.1)
Sodium: 140 meq/L (ref 135–145)
Total Bilirubin: 0.3 mg/dL (ref 0.2–1.2)
Total Protein: 6.9 g/dL (ref 6.0–8.3)

## 2023-11-19 LAB — TSH: TSH: 1.55 u[IU]/mL (ref 0.35–5.50)

## 2023-11-19 LAB — HEMOGLOBIN A1C: Hgb A1c MFr Bld: 5.9 % (ref 4.6–6.5)

## 2023-11-19 NOTE — Progress Notes (Signed)
Subjective:    Patient ID: Hailey Noble Hence, female    DOB: 1966-02-20, 57 y.o.   MRN: [id number]  Chief Complaint  Patient presents with   Annual Exam    HPI Discussed the use of AI scribe software for clinical note transcription with the patient, who gave verbal consent to proceed.  History of Present Illness   The patient, with a history of sleep apnea managed with a CPAP machine, presents with a recent upper respiratory infection for which she has been taking Robitussin expectorant. She reports improvement in her symptoms, which initially presented as a head cold and have now localized to her throat. She also reports shortness of breath, particularly when walking a mile or climbing stairs, attributing it to her weight gain and long work hours. She denies any chest pain or palpitations. The patient also reports experiencing headaches over the past two to three weeks, which she attributes to weather changes and stress from personal life events. She also mentions a sensation of hearing her heartbeat in her eardrum when lying down at night. The patient is on Lexapro, Pantoprazole, and Ursodiol, and reports taking Pantoprazole two to three times a week as needed. She is also on an EpiPen which she requests to be refilled. The patient has not received her flu shot or COVID booster yet. She has a history of hysterectomy and is due for a colonoscopy in 2022. She has not had a bone density test yet. The patient expresses concern about her weight and is interested in weight loss strategies.        Past Medical History:  Diagnosis Date   Allergic state 12/08/2016   Allergy    Anxiety    Back pain    " i just have a weak loer back"    Fibroids    GERD (gastroesophageal reflux disease)    Hyperlipidemia 06/09/2017   Insulin resistance    Joint pain    Knee pain, right 03/10/2017   Plantar fasciitis    PONV (postoperative nausea and vomiting)    just nausea    Shortness of breath    not only  if  i exert myselg    Sinusitis    Sleep apnea    Sleep apnea in adult 12/26/2020   Vitamin D deficiency 11/22/2016   Vitamin D deficiency     Past Surgical History:  Procedure Laterality Date   COLONOSCOPY  02/2009   with Marina Goodell normal   CYSTOSCOPY N/A 10/12/2019   Procedure: CYSTOSCOPY;  Surgeon: Edwinna Areola, DO;  Location: [facility];  Service: Gynecology;  Laterality: N/A;   ESOPHAGOGASTRODUODENOSCOPY  2012   GANGLION CYST EXCISION Right    right- wrist    NASAL SEPTUM SURGERY     NASAL SINUS SURGERY     TMJ ARTHROSCOPY     left   TOTAL ABDOMINAL HYSTERECTOMY  09/2019   TOTAL LAPAROSCOPIC HYSTERECTOMY WITH BILATERAL SALPINGO OOPHORECTOMY Bilateral 10/12/2019   Procedure: TOTAL LAPAROSCOPIC HYSTERECTOMY WITH BILATERAL SALPINGO OOPHORECTOMY, Lysis Pelvic Adhseions ;  Surgeon: Edwinna Areola, DO;  Location: [facility];  Service: Gynecology;  Laterality: Bilateral;    Family History  Problem Relation Age of Onset   Hypertension Father    Diabetes Father    Hyperlipidemia Father    Breast cancer Mother 36   Cancer Mother        breast   Thyroid disease Mother    Sleep apnea Mother    Colon cancer Paternal  Grandfather        died at age 10   Cancer Paternal Grandfather        GI CA   Obesity Sister    Diabetes Sister    Hypertension Sister    Heart disease Maternal Grandmother        MI   Cancer Maternal Grandfather        lung, smoker   Diabetes Maternal Grandfather    Diabetes Paternal Grandmother    Heart disease Paternal Grandmother    Pancreatitis Sister    Esophageal cancer Neg Hx    Stomach cancer Neg Hx    Stroke Neg Hx    Colon polyps Neg Hx    Rectal cancer Neg Hx    Pancreatic cancer Neg Hx     Social History   Socioeconomic History   Marital status: Married    Spouse name: Onalee Hua   Number of children: Not on file   Years of education: Not on file   Highest education level: Not on file   Occupational History   Occupation: Teacher, adult education: [employer]  Tobacco Use   Smoking status: Never   Smokeless tobacco: Never  Vaping Use   Vaping status: Never Used  Substance and Sexual Activity   Alcohol use: Yes    Alcohol/week: 0.0 standard drinks of alcohol    Comment: rare   Drug use: No   Sexual activity: Yes    Birth control/protection: I.U.D.  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 Works at [employer] center. No dietary restrictions. Lives with dog      Right Handed   1 cup of Coffee per Day   Social Determinants of Health   Financial Resource Strain: Not on file  Food Insecurity: Not on file  Transportation Needs: Not on file  Physical Activity: Not on file  Stress: Not on file  Social Connections: Not on file  Intimate Partner Violence: Not on file    Outpatient Medications Prior to Visit  Medication Sig Dispense Refill   acetaminophen (TYLENOL) 500 MG tablet Take 500 mg by mouth every 6 (six) hours as needed for headache.     albuterol (VENTOLIN HFA) 108 (90 Base) MCG/ACT inhaler Inhale 2 puffs into the lungs every 6 (six) hours as needed. 6.7 g 0   amoxicillin-clavulanate (AUGMENTIN) 875-125 MG tablet Take 1 tablet by mouth 2 (two) times daily. 20 tablet 0   benzonatate (TESSALON) 100 MG capsule Take 1 capsule (100 mg total) by mouth 3 (three) times daily as needed for cough. 30 capsule 0   buPROPion (WELLBUTRIN) 75 MG tablet Take 1 tablet (75 mg total) by mouth daily. 90 tablet 1   Calcium Carbonate-Vitamin D (CALTRATE 600+D PO) Take 1,200 mg by mouth daily.     escitalopram (LEXAPRO) 10 MG tablet Take 1 tablet (10 mg total) by mouth daily. 90 tablet 1   fluticasone (FLONASE) 50 MCG/ACT nasal spray Place 2 sprays into both nostrils daily. 16 g 1   metFORMIN (GLUCOPHAGE-XR) 500 MG 24 hr tablet Take 1 tablet (500 mg total) by mouth daily with breakfast. *Need  appointment for refills.* 90 tablet 0   Polyethylene Glycol 3350 (MIRALAX PO) Take by mouth.     Vitamin D, Cholecalciferol, 25 MCG (1000 UT) TABS Take 4,000 units daily 60 tablet    EPINEPHrine (EPIPEN 2-PAK) 0.3 mg/0.3 mL IJ SOAJ  injection Inject 0.3 mg into the muscle as needed for anaphylaxis (repeat in 15 minutes if symptoms return seek care). 2 each 1   fluticasone (FLONASE) 50 MCG/ACT nasal spray Place 2 sprays into both nostrils daily. 16 g 5   Krill Oil 500 MG CAPS Take 1 each by mouth daily.     pantoprazole (PROTONIX) 40 MG tablet Take 1 tablet (40 mg total) by mouth daily. 90 tablet 4   No facility-administered medications prior to visit.    Allergies  Allergen Reactions   Coconut Fatty Acid Hives   Codeine Hives and Itching   Mango Flavor Hives   [name] Itching and Swelling   Oxycodone-Acetaminophen     rash on face and head, itching   Tussionex Pennkinetic Er [Hydrocod Poli-Chlorphe Poli Er]     rash on face and head, itching   Ultram [Tramadol] Other (See Comments)    Dizzy and low blood pressure.     Review of Systems  Constitutional:  Positive for malaise/fatigue. Negative for chills and fever.  HENT:  Positive for congestion and sore throat. Negative for hearing loss.   Eyes:  Negative for discharge.  Respiratory:  Negative for cough, sputum production and shortness of breath.   Cardiovascular:  Negative for chest pain, palpitations and leg swelling.  Gastrointestinal:  Positive for heartburn. Negative for abdominal pain, blood in stool, constipation, diarrhea, nausea and vomiting.  Genitourinary:  Negative for dysuria, frequency, hematuria and urgency.  Musculoskeletal:  Negative for back pain, falls and myalgias.  Skin:  Negative for rash.  Neurological:  Negative for dizziness, sensory change, loss of consciousness, weakness and headaches.  Endo/Heme/Allergies:  Negative for environmental allergies. Does not bruise/bleed easily.  Psychiatric/Behavioral:   Negative for depression and suicidal ideas. The patient is not nervous/anxious and does not have insomnia.        Objective:    Physical Exam Constitutional:      General: She is not in acute distress.    Appearance: Normal appearance. She is not diaphoretic.  HENT:     Head: Normocephalic and atraumatic.     Right Ear: Tympanic membrane, ear canal and external ear normal.     Left Ear: Tympanic membrane, ear canal and external ear normal.     Nose: Nose normal.     Mouth/Throat:     Mouth: Mucous membranes are moist.     Pharynx: Oropharynx is clear. No oropharyngeal exudate.  Eyes:     General: No scleral icterus.       Right eye: No discharge.        Left eye: No discharge.     Conjunctiva/sclera: Conjunctivae normal.     Pupils: Pupils are equal, round, and reactive to light.  Neck:     Thyroid: No thyromegaly.  Cardiovascular:     Rate and Rhythm: Normal rate and regular rhythm.     Heart sounds: Normal heart sounds. No murmur heard. Pulmonary:     Effort: Pulmonary effort is normal. No respiratory distress.     Breath sounds: Normal breath sounds. No wheezing or rales.  Abdominal:     General: Bowel sounds are normal. There is no distension.     Palpations: Abdomen is soft. There is no mass.     Tenderness: There is no abdominal tenderness.  Musculoskeletal:        General: No tenderness. Normal range of motion.     Cervical back: Normal range of motion and neck supple.  Lymphadenopathy:  Cervical: No cervical adenopathy.  Skin:    General: Skin is warm and dry.     Findings: No rash.  Neurological:     General: No focal deficit present.     Mental Status: She is alert and oriented to person, place, and time.     Cranial Nerves: No cranial nerve deficit.     Coordination: Coordination normal.     Deep Tendon Reflexes: Reflexes are normal and symmetric. Reflexes normal.  Psychiatric:        Mood and Affect: Mood normal.        Behavior: Behavior normal.         Thought Content: Thought content normal.        Judgment: Judgment normal.     BP (!) 150/92 (BP Location: Left Arm, Patient Position: Sitting, Cuff Size: Large)   Pulse 97   Temp 98.3 F (36.8 C) (Oral)   Resp 18   Ht 5\' 5"  (1.651 m)   Wt 254 lb 12.8 oz (115.6 kg)   LMP 11/22/2018 (Approximate)   SpO2 97%   BMI 42.40 kg/m  Wt Readings from Last 3 Encounters:  11/18/23 254 lb 12.8 oz (115.6 kg)  11/17/23 254 lb (115.2 kg)  03/05/23 246 lb (111.6 kg)    Diabetic Foot Exam - Simple   No data filed    Lab Results  Component Value Date   WBC 11.5 (H) 11/18/2023   HGB 13.4 11/18/2023   HCT 40.6 11/18/2023   PLT 359.0 11/18/2023   GLUCOSE 90 11/18/2023   CHOL 163 11/18/2023   TRIG 263.0 (H) 11/18/2023   HDL 36.90 (L) 11/18/2023   LDLDIRECT 98.0 03/05/2023   LDLCALC 73 11/18/2023   ALT 18 11/18/2023   AST 13 11/18/2023   NA 140 11/18/2023   K 3.9 11/18/2023   CL 103 11/18/2023   CREATININE 0.70 11/18/2023   BUN 12 11/18/2023   CO2 30 11/18/2023   TSH 1.55 11/18/2023   INR 1.0 06/07/2021   HGBA1C 5.9 11/18/2023    Lab Results  Component Value Date   TSH 1.55 11/18/2023   Lab Results  Component Value Date   WBC 11.5 (H) 11/18/2023   HGB 13.4 11/18/2023   HCT 40.6 11/18/2023   MCV 89.4 11/18/2023   PLT 359.0 11/18/2023   Lab Results  Component Value Date   NA 140 11/18/2023   K 3.9 11/18/2023   CO2 30 11/18/2023   GLUCOSE 90 11/18/2023   BUN 12 11/18/2023   CREATININE 0.70 11/18/2023   BILITOT 0.3 11/18/2023   ALKPHOS 148 (H) 11/18/2023   AST 13 11/18/2023   ALT 18 11/18/2023   PROT 6.9 11/18/2023   ALBUMIN 4.4 11/18/2023   CALCIUM 9.6 11/18/2023   ANIONGAP 8 10/08/2019   GFR 95.25 11/18/2023   Lab Results  Component Value Date   CHOL 163 11/18/2023   Lab Results  Component Value Date   HDL 36.90 (L) 11/18/2023   Lab Results  Component Value Date   LDLCALC 73 11/18/2023   Lab Results  Component Value Date   TRIG 263.0 (H)  11/18/2023   Lab Results  Component Value Date   CHOLHDL 4 11/18/2023   Lab Results  Component Value Date   HGBA1C 5.9 11/18/2023       Assessment & Plan:  Class 3 severe obesity without serious comorbidity with body mass index (BMI) of 40.0 to 44.9 in adult, unspecified obesity type (HCC) Assessment & Plan: Encouraged DASH or MIND diet,  decrease po intake and increase exercise as tolerated. Needs 7-8 hours of sleep nightly. Avoid trans fats, eat small, frequent meals every 4-5 hours with lean proteins, complex carbs and healthy fats. Minimize simple carbs, high fat foods and processed foods.    Hyperglycemia Assessment & Plan: hgba1c acceptable, minimize simple carbs. Increase exercise as tolerated.   Orders: -     ECHOCARDIOGRAM COMPLETE; Future -     Comprehensive metabolic panel -     TSH -     Hemoglobin A1c  Other hyperlipidemia Assessment & Plan: Encourage heart healthy diet such as MIND or DASH diet, increase exercise, avoid trans fats, simple carbohydrates and processed foods, consider a krill or fish or flaxseed oil cap daily.    Orders: -     Lipid panel  Preventative health care Assessment & Plan: Patient encouraged to maintain heart healthy diet, regular exercise, adequate sleep. Consider daily probiotics. Take medications as prescribed  Labs ordered and reviewed  Colonoscopy 2022 repeat in 2027 [facility] 11/2022 repeat every 1-2 years Pap at [facility], sees Lisbeth Ply, NP, had a TAH 09/2019 now no longer does paps just pelvics in March of 2023 Covid and flu boosters   Vitamin D deficiency Assessment & Plan: Supplement and monitor   Orders: -     VITAMIN D 25 Hydroxy (Vit-D Deficiency, Fractures)  Dyspnea, unspecified type -     ECHOCARDIOGRAM COMPLETE; Future -     CT CARDIAC SCORING (SELF PAY ONLY); Future -     CBC with Differential/Platelet  Other orders -     Pantoprazole Sodium; Take 1 tablet (40 mg total) by mouth daily as needed. -     Famotidine;  Take 1 tablet (10 mg total) by mouth daily. -     Krill Oil; Take 2 capsules (1,000 mg total) by mouth daily. -     EPINEPHrine; Inject 0.3 mg into the muscle as needed for anaphylaxis (repeat in 15 minutes, if symptoms return seek care).  Dispense: 2 each; Refill: 1    Assessment and Plan    Upper Respiratory Infection Improvement with Augmentin, Albuterol inhaler, Benzonatate, and Flonase. Also using Robitussin expectorant and maintaining hydration. -Continue current medications and hydration.  Shortness of Breath Noted with exertion and bending over, no associated chest pain or palpitations. Likely due to deconditioning and obesity. No family history of cardiac disease under age 65. -Order echocardiogram and coronary artery calcium score. -Check thyroid, kidney function, and A1C. -Consider referral to bariatric medicine if patient is interested.  Headaches Recent onset, possibly related to weather changes and CPAP use. -Continue current management, monitor for changes.  Hyperglycemia A1C trending upwards, patient has lost weight in the past. -If A1C reaches 6.5 or higher, consider starting Mounjaro. -Encourage adherence to a diet low in processed carbohydrates and high in protein (MIND diet).  General Health Maintenance -Continue Lexapro 10mg  for mental health. -Continue Pantoprazole and Miralax as needed for stomach issues. -Continue Pepcid 10mg  as needed. -Continue Ursodiol for fatty liver, monitor with abdominal ultrasound every 2 years. -Refill EpiPen prescription. -Encourage minimum of 4000 steps daily, ideally 8000. -Encourage annual skin cancer screening starting at age 33. -Start bone density screenings at age 39. -Encourage calcium intake of 1200-1500mg  daily. -Consider COVID booster with Novorex. -Schedule follow-up in 6 months.         Danise Edge, MD

## 2023-11-23 LAB — ALKALINE PHOSPHATASE ISOENZYMES
Alkaline phosphatase (APISO): 141 U/L (ref 37–153)
Bone Isoenzymes: 38 % (ref 28–66)
Intestinal Isoenzymes: 11 % (ref 1–24)
Liver Isoenzymes: 51 % (ref 25–69)
Macrohepatic isoenzymes: 0 % (ref <=0)
Placental isoenzymes: 0 % (ref <=0)

## 2023-11-24 ENCOUNTER — Other Ambulatory Visit: Payer: Self-pay | Admitting: Family Medicine

## 2023-11-24 ENCOUNTER — Other Ambulatory Visit (HOSPITAL_BASED_OUTPATIENT_CLINIC_OR_DEPARTMENT_OTHER): Payer: Self-pay

## 2023-11-24 MED ORDER — BUPROPION HCL 75 MG PO TABS
75.0000 mg | ORAL_TABLET | Freq: Every day | ORAL | 1 refills | Status: DC
  Filled 2023-11-24: qty 90, 90d supply, fill #0
  Filled 2024-02-20: qty 90, 90d supply, fill #1

## 2023-11-26 ENCOUNTER — Other Ambulatory Visit (HOSPITAL_BASED_OUTPATIENT_CLINIC_OR_DEPARTMENT_OTHER): Payer: Self-pay

## 2023-11-26 ENCOUNTER — Other Ambulatory Visit: Payer: Self-pay | Admitting: Family Medicine

## 2023-11-26 ENCOUNTER — Encounter: Payer: Self-pay | Admitting: Family Medicine

## 2023-11-26 ENCOUNTER — Ambulatory Visit (HOSPITAL_BASED_OUTPATIENT_CLINIC_OR_DEPARTMENT_OTHER)
Admission: RE | Admit: 2023-11-26 | Discharge: 2023-11-26 | Disposition: A | Discharge status: Home / Self Care | Payer: Self-pay | Source: Ambulatory Visit | Attending: Family Medicine | Admitting: Family Medicine

## 2023-11-26 DIAGNOSIS — R0600 Dyspnea, unspecified: Secondary | ICD-10-CM

## 2023-11-26 DIAGNOSIS — I2510 Atherosclerotic heart disease of native coronary artery without angina pectoris: Secondary | ICD-10-CM

## 2023-11-26 DIAGNOSIS — E88819 Insulin resistance, unspecified: Secondary | ICD-10-CM

## 2023-11-26 DIAGNOSIS — E66813 Obesity, class 3: Secondary | ICD-10-CM

## 2023-11-26 DIAGNOSIS — E7849 Other hyperlipidemia: Secondary | ICD-10-CM

## 2023-11-26 DIAGNOSIS — R739 Hyperglycemia, unspecified: Secondary | ICD-10-CM

## 2023-11-26 DIAGNOSIS — G4730 Sleep apnea, unspecified: Secondary | ICD-10-CM

## 2023-11-26 MED ORDER — ATORVASTATIN CALCIUM 10 MG PO TABS
10.0000 mg | ORAL_TABLET | Freq: Every day | ORAL | 1 refills | Status: DC
  Filled 2023-11-26: qty 90, 90d supply, fill #0

## 2023-11-27 ENCOUNTER — Other Ambulatory Visit (HOSPITAL_BASED_OUTPATIENT_CLINIC_OR_DEPARTMENT_OTHER): Payer: Self-pay

## 2023-11-28 ENCOUNTER — Other Ambulatory Visit: Payer: Self-pay | Admitting: Emergency Medicine

## 2023-11-28 DIAGNOSIS — I2510 Atherosclerotic heart disease of native coronary artery without angina pectoris: Secondary | ICD-10-CM

## 2023-12-01 ENCOUNTER — Other Ambulatory Visit: Payer: Self-pay | Admitting: Family Medicine

## 2023-12-01 ENCOUNTER — Other Ambulatory Visit (HOSPITAL_BASED_OUTPATIENT_CLINIC_OR_DEPARTMENT_OTHER): Payer: Self-pay

## 2023-12-01 ENCOUNTER — Ambulatory Visit (HOSPITAL_BASED_OUTPATIENT_CLINIC_OR_DEPARTMENT_OTHER)
Admission: RE | Admit: 2023-12-01 | Discharge: 2023-12-01 | Disposition: A | Discharge status: Home / Self Care | Payer: Commercial Managed Care - PPO | Source: Ambulatory Visit | Attending: Family Medicine | Admitting: Family Medicine

## 2023-12-01 DIAGNOSIS — E559 Vitamin D deficiency, unspecified: Secondary | ICD-10-CM

## 2023-12-01 DIAGNOSIS — R0600 Dyspnea, unspecified: Secondary | ICD-10-CM

## 2023-12-01 DIAGNOSIS — R0609 Other forms of dyspnea: Secondary | ICD-10-CM

## 2023-12-01 DIAGNOSIS — E7849 Other hyperlipidemia: Secondary | ICD-10-CM

## 2023-12-01 DIAGNOSIS — R739 Hyperglycemia, unspecified: Secondary | ICD-10-CM

## 2023-12-01 DIAGNOSIS — Z0000 Encounter for general adult medical examination without abnormal findings: Secondary | ICD-10-CM

## 2023-12-01 LAB — ECHOCARDIOGRAM COMPLETE
AR max vel: 2.15 cm2
AV Area VTI: 2.43 cm2
AV Area mean vel: 2.25 cm2
AV Mean grad: 5 mm[Hg]
AV Peak grad: 9.6 mm[Hg]
Ao pk vel: 1.55 m/s
Area-P 1/2: 4.1 cm2
Calc EF: 66.2 %
MV M vel: 2.13 m/s
MV Peak grad: 18.1 mm[Hg]
S' Lateral: 3.1 cm
Single Plane A2C EF: 69.3 %
Single Plane A4C EF: 66.6 %

## 2023-12-01 MED ORDER — METFORMIN HCL ER 500 MG PO TB24
500.0000 mg | ORAL_TABLET | Freq: Every day | ORAL | 1 refills | Status: DC
  Filled 2023-12-01: qty 90, 90d supply, fill #0
  Filled 2024-03-01: qty 90, 90d supply, fill #1

## 2023-12-04 ENCOUNTER — Other Ambulatory Visit (HOSPITAL_BASED_OUTPATIENT_CLINIC_OR_DEPARTMENT_OTHER): Payer: Self-pay

## 2023-12-05 ENCOUNTER — Other Ambulatory Visit: Payer: Self-pay | Admitting: Medical

## 2023-12-05 ENCOUNTER — Other Ambulatory Visit (HOSPITAL_BASED_OUTPATIENT_CLINIC_OR_DEPARTMENT_OTHER): Payer: Self-pay

## 2023-12-05 MED ORDER — ALBUTEROL SULFATE HFA 108 (90 BASE) MCG/ACT IN AERS
2.0000 | INHALATION_SPRAY | Freq: Four times a day (QID) | RESPIRATORY_TRACT | 0 refills | Status: DC | PRN
  Filled 2023-12-05: qty 6.7, 25d supply, fill #0

## 2023-12-10 ENCOUNTER — Other Ambulatory Visit (HOSPITAL_BASED_OUTPATIENT_CLINIC_OR_DEPARTMENT_OTHER): Payer: Self-pay

## 2023-12-11 ENCOUNTER — Ambulatory Visit: Payer: Commercial Managed Care - PPO | Admitting: Family

## 2023-12-11 ENCOUNTER — Ambulatory Visit (HOSPITAL_BASED_OUTPATIENT_CLINIC_OR_DEPARTMENT_OTHER)
Admission: RE | Admit: 2023-12-11 | Discharge: 2023-12-11 | Disposition: A | Discharge status: Home / Self Care | Payer: Commercial Managed Care - PPO | Source: Ambulatory Visit | Attending: Family | Admitting: Family

## 2023-12-11 ENCOUNTER — Other Ambulatory Visit (HOSPITAL_BASED_OUTPATIENT_CLINIC_OR_DEPARTMENT_OTHER): Payer: Self-pay

## 2023-12-11 VITALS — BP 130/84 | HR 78 | Temp 98.5°F | Ht 65.0 in | Wt 249.4 lb

## 2023-12-11 DIAGNOSIS — R059 Cough, unspecified: Secondary | ICD-10-CM

## 2023-12-11 DIAGNOSIS — J209 Acute bronchitis, unspecified: Secondary | ICD-10-CM

## 2023-12-11 DIAGNOSIS — R918 Other nonspecific abnormal finding of lung field: Secondary | ICD-10-CM | POA: Diagnosis not present

## 2023-12-11 MED ORDER — ALBUTEROL SULFATE HFA 108 (90 BASE) MCG/ACT IN AERS
2.0000 | INHALATION_SPRAY | Freq: Four times a day (QID) | RESPIRATORY_TRACT | 0 refills | Status: DC | PRN
  Filled 2023-12-11: qty 6.7, 25d supply, fill #0

## 2023-12-11 MED ORDER — PREDNISONE 20 MG PO TABS
20.0000 mg | ORAL_TABLET | Freq: Every day | ORAL | 0 refills | Status: DC
  Filled 2023-12-11: qty 5, 5d supply, fill #0

## 2023-12-11 MED ORDER — DOXYCYCLINE HYCLATE 100 MG PO TABS
100.0000 mg | ORAL_TABLET | Freq: Two times a day (BID) | ORAL | 0 refills | Status: DC
  Filled 2023-12-11: qty 14, 7d supply, fill #0

## 2023-12-11 MED ORDER — PROMETHAZINE-DM 6.25-15 MG/5ML PO SYRP
5.0000 mL | ORAL_SOLUTION | Freq: Four times a day (QID) | ORAL | 0 refills | Status: DC | PRN
  Filled 2023-12-11: qty 118, 6d supply, fill #0

## 2023-12-11 NOTE — Progress Notes (Signed)
Hailey Noble is a 57 y.o. 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 04/12/2022   Rash and other nonspecific skin eruption 01/30/2021   Pruritus 01/30/2021   Other rhinitis 01/30/2021   Adverse reaction to food, subsequent encounter 01/30/2021   Sleep apnea in adult 12/26/2020   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 06/01/2020   Left arm pain 06/01/2020   Peroneal tendinitis of left lower leg 02/16/2020   Leukocytosis 12/02/2019   Hyperglycemia 12/02/2019   Pain of left heel 12/02/2019   Pelvic pain 10/12/2019   Post-operative state 10/12/2019   Pain, abdominal, RLQ 11/28/2017   Insulin resistance 10/21/2017   Hyperlipidemia 06/09/2017   Knee pain, right 03/10/2017   Allergy 12/08/2016   Vitamin D deficiency 11/22/2016   Depression with anxiety 04/06/2014   Obesity (BMI 30-39.9) 04/06/2014   Preventative health care 06/17/2011   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     atorvastatin (LIPITOR) 10 MG tablet Take 1 tablet (10 mg total) by mouth daily. 90 tablet 1   buPROPion (WELLBUTRIN) 75 MG tablet Take 1 tablet (75 mg total) by mouth daily. 90 tablet 1   Calcium Carbonate-Vitamin D (CALTRATE 600+D PO) Take 1,200 mg by mouth daily.     doxycycline (VIBRA-TABS) 100 MG tablet Take 1  tablet (100 mg total) by mouth 2 (two) times daily. 14 tablet 0   EPINEPHrine (EPIPEN 2-PAK) 0.3 mg/0.3 mL IJ SOAJ injection Inject 0.3 mg into the muscle as needed for anaphylaxis (repeat in 15 minutes, if symptoms return seek care). 2 each 1   escitalopram (LEXAPRO) 10 MG tablet Take 1 tablet (10 mg total) by mouth daily. 90 tablet 1   famotidine (PEPCID) 10 MG tablet Take 1 tablet (10 mg total) by mouth daily.     fluticasone (FLONASE) 50 MCG/ACT nasal spray Place 2 sprays into both nostrils daily. 16 g 1   Krill Oil 500 MG CAPS Take 2 capsules (1,000 mg total) by mouth daily.     metFORMIN (GLUCOPHAGE-XR) 500 MG 24 hr tablet Take 1 tablet (500 mg total) by mouth daily with breakfast. 90 tablet 1   pantoprazole (PROTONIX) 40 MG tablet Take 1 tablet (40 mg total) by mouth daily as needed.     Polyethylene Glycol 3350 (MIRALAX PO) Take by mouth.     predniSONE (DELTASONE) 20 MG tablet Take 1 tablet (20 mg total) by mouth daily with breakfast. 5 tablet 0   promethazine-dextromethorphan (PROMETHAZINE-DM) 6.25-15 MG/5ML syrup Take 5 mLs by mouth 4 (four) times daily as needed for cough. 118 mL 0   Vitamin D, Cholecalciferol, 25 MCG (1000 UT) TABS Take 4,000 units daily 60 tablet    albuterol (VENTOLIN HFA) 108 (90 Base) MCG/ACT inhaler Inhale 2 puffs into the lungs every 6 (  six) hours as needed. 6.7 g 0   No current facility-administered medications for this visit.    Allergies: Coconut fatty acid, Codeine, Mango flavoring agent (non-screening), [name], Oxycodone-acetaminophen, Tussionex pennkinetic er [hydrocod poli-chlorphe poli er], and Ultram [tramadol]  Past Medical History:  Diagnosis Date   Allergic state 12/08/2016   Allergy    Anxiety    Back pain    " i just have a weak loer back"    Fibroids    GERD (gastroesophageal reflux disease)    Hyperlipidemia 06/09/2017   Insulin resistance    Joint pain    Knee pain, right 03/10/2017   Plantar fasciitis    PONV (postoperative  nausea and vomiting)    just nausea    Shortness of breath    not only if  i exert myselg    Sinusitis    Sleep apnea    Sleep apnea in adult 12/26/2020   Vitamin D deficiency 11/22/2016   Vitamin D deficiency     Past Surgical History:  Procedure Laterality Date   COLONOSCOPY  02/2009   with Marina Goodell normal   CYSTOSCOPY N/A 10/12/2019   Procedure: CYSTOSCOPY;  Surgeon: Edwinna Areola, DO;  Location: [facility];  Service: Gynecology;  Laterality: N/A;   ESOPHAGOGASTRODUODENOSCOPY  2012   GANGLION CYST EXCISION Right    right- wrist    NASAL SEPTUM SURGERY     NASAL SINUS SURGERY     TMJ ARTHROSCOPY     left   TOTAL ABDOMINAL HYSTERECTOMY  09/2019   TOTAL LAPAROSCOPIC HYSTERECTOMY WITH BILATERAL SALPINGO OOPHORECTOMY Bilateral 10/12/2019   Procedure: TOTAL LAPAROSCOPIC HYSTERECTOMY WITH BILATERAL SALPINGO OOPHORECTOMY, Lysis Pelvic Adhseions ;  Surgeon: Edwinna Areola, DO;  Location: [facility];  Service: Gynecology;  Laterality: Bilateral;    Family History  Problem Relation Age of Onset   Hypertension Father    Diabetes Father    Hyperlipidemia Father    Breast cancer Mother 20   Cancer Mother        breast   Thyroid disease Mother    Sleep apnea Mother    Colon cancer Paternal Grandfather        died at age 75   Cancer Paternal Grandfather        GI CA   Obesity Sister    Diabetes Sister    Hypertension Sister    Heart disease Maternal Grandmother        MI   Cancer Maternal Grandfather        lung, smoker   Diabetes Maternal Grandfather    Diabetes Paternal Grandmother    Heart disease Paternal Grandmother    Pancreatitis Sister    Esophageal cancer Neg Hx    Stomach cancer Neg Hx    Stroke Neg Hx    Colon polyps Neg Hx    Rectal cancer Neg Hx    Pancreatic cancer Neg Hx     Social History   Tobacco Use   Smoking status: Never   Smokeless tobacco: Never  Substance Use Topics   Alcohol use: Yes     Alcohol/week: 0.0 standard drinks of alcohol    Comment: rare    Subjective:   4 day history of cough/ congestion; notes that husband is sick with similar symptoms; was treated for sinus infection at the end of November; did feel that original infection cleared completely;  Notes that upper chest feels tight;   Objective:  Vitals:   12/11/23 0904  BP:  130/84  Pulse: 78  Temp: 98.5 F (36.9 C)  TempSrc: Oral  SpO2: 97%  Weight: 249 lb 6.4 oz (113.1 kg)  Height: 5\' 5"  (1.651 m)    General: Well developed, well nourished, in no acute distress  Skin : Warm and dry.  Head: Normocephalic and atraumatic  Eyes: Sclera and conjunctiva clear; pupils round and reactive to light; extraocular movements intact  Ears: External normal; canals clear; tympanic membranes normal  Oropharynx: Pink, supple. No suspicious lesions  Neck: Supple without thyromegaly, adenopathy  Lungs: Respirations unlabored; coarse breath sounds noted in bilateral lobes CVS exam: normal rate and regular rhythm.  Neurologic: Alert and oriented; speech intact; face symmetrical; moves all extremities well; CNII-XII intact without focal deficit   Assessment:  1. Cough, unspecified type   2. Acute bronchitis, unspecified organism     Plan:   Concern for early pneumonia; will update CXR today; Rx for Doxycycline, Prednisone and Promethazine DM to use at night; increase fluids, rest and follow up to be determined.   No follow-ups on file.  Orders Placed This Encounter  Procedures   DG Chest 2 View    Standing Status:   Future    Number of Occurrences:   1    Expiration Date:   12/10/2024    Reason for Exam (SYMPTOM  OR DIAGNOSIS REQUIRED):   cough/ rule out pneumonia    Is patient pregnant?:   No    Preferred imaging location?:   [facility]    Requested Prescriptions   Signed Prescriptions Disp Refills   albuterol (VENTOLIN HFA) 108 (90 Base) MCG/ACT inhaler 6.7 g 0    Sig: Inhale 2 puffs into the  lungs every 6 (six) hours as needed.   promethazine-dextromethorphan (PROMETHAZINE-DM) 6.25-15 MG/5ML syrup 118 mL 0    Sig: Take 5 mLs by mouth 4 (four) times daily as needed for cough.   doxycycline (VIBRA-TABS) 100 MG tablet 14 tablet 0    Sig: Take 1 tablet (100 mg total) by mouth 2 (two) times daily.   predniSONE (DELTASONE) 20 MG tablet 5 tablet 0    Sig: Take 1 tablet (20 mg total) by mouth daily with breakfast.

## 2023-12-22 ENCOUNTER — Other Ambulatory Visit: Payer: Self-pay | Admitting: Family Medicine

## 2023-12-22 ENCOUNTER — Other Ambulatory Visit (HOSPITAL_BASED_OUTPATIENT_CLINIC_OR_DEPARTMENT_OTHER): Payer: Self-pay

## 2023-12-22 DIAGNOSIS — Z1231 Encounter for screening mammogram for malignant neoplasm of breast: Secondary | ICD-10-CM | POA: Diagnosis not present

## 2023-12-22 DIAGNOSIS — F418 Other specified anxiety disorders: Secondary | ICD-10-CM

## 2023-12-22 LAB — HM MAMMOGRAPHY

## 2023-12-22 MED ORDER — ESCITALOPRAM OXALATE 10 MG PO TABS
10.0000 mg | ORAL_TABLET | Freq: Every day | ORAL | 1 refills | Status: DC
  Filled 2023-12-22: qty 90, 90d supply, fill #0
  Filled 2024-03-19: qty 90, 90d supply, fill #1

## 2023-12-24 DIAGNOSIS — K743 Primary biliary cirrhosis: Secondary | ICD-10-CM

## 2023-12-24 HISTORY — DX: Primary biliary cirrhosis: K74.3

## 2023-12-25 ENCOUNTER — Encounter: Payer: Self-pay | Admitting: Emergency Medicine

## 2023-12-30 ENCOUNTER — Other Ambulatory Visit: Payer: Self-pay | Admitting: Family

## 2023-12-30 ENCOUNTER — Other Ambulatory Visit (HOSPITAL_BASED_OUTPATIENT_CLINIC_OR_DEPARTMENT_OTHER): Payer: Self-pay

## 2023-12-30 MED ORDER — ALBUTEROL SULFATE HFA 108 (90 BASE) MCG/ACT IN AERS: 2.0000 | INHALATION_SPRAY | Freq: Four times a day (QID) | RESPIRATORY_TRACT | 0 refills | Status: DC | PRN

## 2024-01-09 ENCOUNTER — Other Ambulatory Visit (HOSPITAL_BASED_OUTPATIENT_CLINIC_OR_DEPARTMENT_OTHER): Payer: Self-pay

## 2024-01-09 ENCOUNTER — Other Ambulatory Visit: Payer: Self-pay | Admitting: Medical

## 2024-01-09 MED ORDER — FLUTICASONE PROPIONATE 50 MCG/ACT NA SUSP
2.0000 | Freq: Every day | NASAL | 1 refills | Status: DC
  Filled 2024-01-09: qty 16, 30d supply, fill #0
  Filled 2024-02-09: qty 16, 30d supply, fill #1

## 2024-01-25 NOTE — Progress Notes (Signed)
 Cardiology Office Note:  .   Date:  01/28/2024  ID:  Hailey Noble, DOB 1966/03/16, MRN [id number] PCP: Domenica Harlene LABOR, MD  [facility] Providers Cardiologist:  Darryle ONEIDA Decent, MD { History of Present Illness: .   Hailey Noble is a 58 y.o. female with history of coronary calcium  who presents for the evaluation of SOB at the request of Domenica Harlene LABOR, MD.  Chief Complaint: SOB   History of Present Illness   Hailey Noble is a 58 year old female with hypertension and obesity who presents with an elevated coronary calcium  score.  She experiences shortness of breath since November, particularly when bending down to tie her shoes and while walking her dogs, especially in cold weather. The sensation is described as similar to having a piece of peppermint or wintergreen in her mouth, with discomfort localized to her chest and occasionally radiating underneath the left breast. These symptoms do not occur at rest. She has a history of obesity and hyperlipidemia and was recently started on Lipitor 10 mg daily, aspirin, and CoQ10 by her primary care physician. Her most recent coronary calcium  score was 7.1, placing her in the 74th percentile.  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 No history of asthma or previous episodes of shortness of breath.  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 No history of diabetes, heart attack, or stroke. She does not take blood pressure medication and has never smoked or consumed alcohol due to nausea.  Family history reveals her father has arrhythmias and congestive heart failure, but there is no strong family history of coronary artery disease. She is married, works as an facilities manager, and has no children.           Problem List Coronary calcium   -CAC 7.1 (74th percentile) 2. HLD -T chol 163, TG 263, HDL 36, LDL 73 3. Obesity -BMI 41     ROS: All other ROS reviewed and negative. Pertinent positives noted in the HPI.     Studies Reviewed: SABRA   EKG Interpretation Date/Time:  Wednesday January 28 2024 09:07:29 EST Ventricular Rate:  72 PR Interval:  150 QRS Duration:  86 QT Interval:  392 QTC Calculation: 429 R Axis:   40  Text Interpretation: Normal sinus rhythm Normal ECG Confirmed by Decent Darryle [phone]) on 01/28/2024 9:11:52 AM    TTE 12/01/2023  1. Left ventricular ejection fraction, by estimation, is 60 to 65%. The  left ventricle has normal function. The left ventricle has no regional  wall motion abnormalities. Left ventricular diastolic parameters are  consistent with Grade I diastolic  dysfunction (impaired relaxation).   2. Right ventricular systolic function is normal. The right ventricular  size is normal.   3. The mitral valve is normal in structure. Trivial mitral valve  regurgitation. No evidence of mitral stenosis.   4. The aortic valve is tricuspid. Aortic valve regurgitation is not  visualized. No aortic stenosis is present.  Physical Exam:   VS:  BP 130/88 (BP Location: Left Arm, Patient Position: Sitting, Cuff Size: Large)   Pulse 72   Ht 5' 5 (1.651 m)   Wt 249 lb (112.9 kg)   LMP 11/22/2018 (Approximate)   BMI 41.44 kg/m    Wt  Readings from Last 3 Encounters:  01/28/24 249 lb (112.9 kg)  12/11/23 249 lb 6.4 oz (113.1 kg)  11/18/23 254 lb 12.8 oz (115.6 kg)    GEN: Well nourished, well developed in no acute distress NECK: No JVD; No carotid bruits CARDIAC: RRR, no murmurs, rubs, gallops RESPIRATORY:  Clear to auscultation without rales, wheezing or rhonchi  ABDOMEN: Soft, non-tender, non-distended EXTREMITIES:  No edema; No deformity  ASSESSMENT AND PLAN: .   Assessment and Plan    Chest pain, possible cardiac etiology SOB Reports discomfort in  chest with activity, potentially representing angina. Recent coronary calcium  score of 7.1 (74th percentile). No signs of congestive heart failure on recent echo. -EKG and echo normal. Reassuring.  -Order Coronary CTA to exclude obstructive CAD. -Continue Lipitor 10mg  daily. -Recheck fasting lipids and BMP at [facility] where patient works. -100 mg metoprolol   prior to scan.    HLD -elevated CAC score. 7.1 (74th percentile) -continue lipitor 10 mg daily. Recheck lipids when fasting.    SOB -with bending down. Suspect restrictive process due to abdominal obesity. No signs of CHF and normal echo. Weight loss recommended.   Obesity BMI of 41. Shortness of breath with bending forward, likely due to abdominal obesity. -Encourage continued efforts for weight loss.  Follow-up as needed based on testing results.              Follow-up: Return if symptoms worsen or fail to improve.   Signed, Darryle DASEN. Barbaraann, MD, [facility]  [facility]  [address], [address] [place], [place] [address] [phone]  9:31 AM

## 2024-01-28 ENCOUNTER — Ambulatory Visit: Payer: Commercial Managed Care - PPO | Attending: Cardiovascular Disease | Admitting: Cardiovascular Disease

## 2024-01-28 ENCOUNTER — Other Ambulatory Visit (HOSPITAL_BASED_OUTPATIENT_CLINIC_OR_DEPARTMENT_OTHER): Payer: Self-pay

## 2024-01-28 ENCOUNTER — Encounter: Payer: Self-pay | Admitting: Cardiovascular Disease

## 2024-01-28 VITALS — BP 130/88 | HR 72 | Ht 65.0 in | Wt 249.0 lb

## 2024-01-28 DIAGNOSIS — E782 Mixed hyperlipidemia: Secondary | ICD-10-CM

## 2024-01-28 DIAGNOSIS — R931 Abnormal findings on diagnostic imaging of heart and coronary circulation: Secondary | ICD-10-CM

## 2024-01-28 DIAGNOSIS — R0602 Shortness of breath: Secondary | ICD-10-CM | POA: Diagnosis not present

## 2024-01-28 DIAGNOSIS — R072 Precordial pain: Secondary | ICD-10-CM | POA: Diagnosis not present

## 2024-01-28 MED ORDER — METOPROLOL TARTRATE 50 MG PO TABS
50.0000 mg | ORAL_TABLET | Freq: Once | ORAL | 0 refills | Status: DC
  Filled 2024-01-28: qty 1, 1d supply, fill #0

## 2024-01-28 MED ORDER — METOPROLOL TARTRATE 100 MG PO TABS
ORAL_TABLET | ORAL | 0 refills | Status: DC
  Filled 2024-01-28: qty 1, fill #0
  Filled 2024-01-29: qty 1, 1d supply, fill #0

## 2024-01-28 NOTE — Addendum Note (Signed)
 Addended by: Bebe Bourdon on: 01/28/2024 09:38 AM   Modules accepted: Orders

## 2024-01-28 NOTE — Patient Instructions (Addendum)
 Medication Instructions:    See below --Metoprolol  tartrate 100 mg  one time dose  for testing    *If you need a refill on your cardiac medications before your next appointment, please call your pharmacy*   Lab Work: fasting BMP LIPID If you have labs (blood work) drawn today and your tests are completely normal, you will receive your results only by: MyChart Message (if you have MyChart) OR A paper copy in the mail If you have any lab test that is abnormal or we need to change your treatment, we will call you to review the results.   Testing/Procedures: Will be schedule at [facility] Your physician has requested that you have coronary  CTA. Coronary computed tomography (CT)angiogram  is a special type of CT scan that uses a computer to produce multi-dimensional views of major blood vessels throughout the heart.  CT angiography, a contrast material is injected through an IV to help visualize the blood vessels  a painless test that uses an x-ray machine to take clear, detailed pictures of your heart arteries .  Please follow instruction sheet as given.    Follow-Up: At [facility], you and your health needs are our priority.  As part of our continuing mission to provide you with exceptional heart care, we have created designated Provider Care Teams.  These Care Teams include your primary Cardiologist (physician) and Advanced Practice Providers (APPs -  Physician Assistants and Nurse Practitioners) who all work together to provide you with the care you need, when you need it.     Your next appointment:   As needed  depending on test results    The format for your next appointment:   In Person  Provider:   Darryle ONEIDA Decent, Hailey Noble    Other Instructions     Your cardiac CT will be scheduled at one of the below locations:   [facility] [address] [place], [place] [address] [phone]  OR  [facility] [address] [place], [place] [address] [phone]  If scheduled at [facility], please arrive at the [facility] and Children's Entrance (Entrance C2) of [facility] 30 minutes prior to test start time. You can use the FREE valet parking offered at entrance C (encouraged to control the heart rate for the test)  Proceed to the [facility] Radiology Department (first floor) to check-in and test prep.  All radiology patients and guests should use entrance C2 at [facility], accessed from [facility], even though the hospital's physical address listed is [address]      If scheduled at [facility], please arrive 30 minutes early for check-in and test prep.  Please follow these instructions carefully (unless otherwise directed):  An IV will be required for this test and Nitroglycerin  will be given.    On the Night Before the Test: Be sure to Drink plenty of water. Do not consume any caffeinated/decaffeinated beverages or chocolate 12 hours prior to your test. Do not take any antihistamines 12 hours prior to your test.   On the Day of the Test: Drink plenty of water until 1 hour prior to the test. Do not eat any food 1 hour prior to test. You may take your regular medications prior to the test.  Take metoprolol   100 mg (Lopressor ) two hours prior to test. FEMALES- please wear underwire-free bra if available, avoid dresses & tight clothing  After the Test: Drink plenty of water. After receiving IV contrast, you may experience a mild flushed feeling. This is normal. On occasion, you may experience a mild rash up to 24 hours after the test. This is not dangerous. If this occurs, you can take Benadryl  25 mg, Zyrtec, Claritin, or Allegra and increase your fluid intake. (Patients taking Tikosyn should avoid Benadryl , and may take Zyrtec, Claritin, or Allegra) If you experience trouble breathing, this can be serious. If it is severe call 911  IMMEDIATELY. If it is mild, please call our office.  We will call to schedule your test 2-4 weeks out understanding that some insurance companies will need an authorization prior to the service being performed.   For more information and frequently asked questions, please visit our website : [email or web address]  For non-scheduling related questions, please contact the cardiac imaging nurse navigator should you have any questions/concerns: Cardiac Imaging Nurse Navigators Direct Office Dial: [phone]   For scheduling needs, including cancellations and rescheduling, please call Brittany, [phone].

## 2024-01-29 ENCOUNTER — Other Ambulatory Visit (HOSPITAL_BASED_OUTPATIENT_CLINIC_OR_DEPARTMENT_OTHER): Payer: Self-pay

## 2024-01-29 DIAGNOSIS — E782 Mixed hyperlipidemia: Secondary | ICD-10-CM | POA: Diagnosis not present

## 2024-01-29 DIAGNOSIS — R931 Abnormal findings on diagnostic imaging of heart and coronary circulation: Secondary | ICD-10-CM | POA: Diagnosis not present

## 2024-01-30 ENCOUNTER — Other Ambulatory Visit: Payer: Self-pay | Admitting: Cardiovascular Disease

## 2024-01-30 ENCOUNTER — Other Ambulatory Visit (HOSPITAL_BASED_OUTPATIENT_CLINIC_OR_DEPARTMENT_OTHER): Payer: Self-pay

## 2024-01-30 LAB — BASIC METABOLIC PANEL
BUN/Creatinine Ratio: 19 (ref 9–23)
BUN: 13 mg/dL (ref 6–24)
CO2: 21 mmol/L (ref 20–29)
Calcium: 9.6 mg/dL (ref 8.7–10.2)
Chloride: 102 mmol/L (ref 96–106)
Creatinine, Ser: 0.67 mg/dL (ref 0.57–1.00)
Glucose: 101 mg/dL — ABNORMAL HIGH (ref 70–99)
Potassium: 4.4 mmol/L (ref 3.5–5.2)
Sodium: 139 mmol/L (ref 134–144)
eGFR: 101 mL/min/{1.73_m2} (ref >59)

## 2024-01-30 LAB — LIPID PANEL
Chol/HDL Ratio: 3.4 {ratio} (ref 0.0–4.4)
Cholesterol, Total: 153 mg/dL (ref 100–199)
HDL: 45 mg/dL (ref >39)
LDL Chol Calc (NIH): 73 mg/dL (ref 0–99)
Triglycerides: 210 mg/dL — ABNORMAL HIGH (ref 0–149)
VLDL Cholesterol Cal: 35 mg/dL (ref 5–40)

## 2024-02-01 ENCOUNTER — Encounter: Payer: Self-pay | Admitting: Cardiovascular Disease

## 2024-02-09 ENCOUNTER — Other Ambulatory Visit (HOSPITAL_COMMUNITY): Payer: Self-pay

## 2024-02-11 ENCOUNTER — Encounter (HOSPITAL_COMMUNITY): Payer: Self-pay

## 2024-02-12 ENCOUNTER — Telehealth (HOSPITAL_COMMUNITY): Payer: Self-pay | Admitting: *Deleted

## 2024-02-12 NOTE — Telephone Encounter (Signed)
 Attempted to call patient regarding upcoming cardiac CT appointment. Left message on voicemail with name and callback number Johney Frame RN Navigator Cardiac Imaging [facility] [phone] Office

## 2024-02-13 ENCOUNTER — Other Ambulatory Visit (HOSPITAL_BASED_OUTPATIENT_CLINIC_OR_DEPARTMENT_OTHER): Payer: Self-pay

## 2024-02-13 ENCOUNTER — Ambulatory Visit (HOSPITAL_COMMUNITY)
Admission: RE | Admit: 2024-02-13 | Discharge: 2024-02-13 | Disposition: A | Discharge status: Home / Self Care | Payer: Commercial Managed Care - PPO | Source: Ambulatory Visit | Attending: Cardiovascular Disease | Admitting: Cardiovascular Disease

## 2024-02-13 ENCOUNTER — Other Ambulatory Visit: Payer: Self-pay

## 2024-02-13 DIAGNOSIS — R0602 Shortness of breath: Secondary | ICD-10-CM | POA: Diagnosis present

## 2024-02-13 DIAGNOSIS — R072 Precordial pain: Secondary | ICD-10-CM | POA: Diagnosis present

## 2024-02-13 DIAGNOSIS — I2510 Atherosclerotic heart disease of native coronary artery without angina pectoris: Secondary | ICD-10-CM | POA: Diagnosis not present

## 2024-02-13 MED ORDER — ATORVASTATIN CALCIUM 20 MG PO TABS
20.0000 mg | ORAL_TABLET | Freq: Every day | ORAL | 3 refills | Status: AC
Start: 2024-02-13 — End: ?
  Filled 2024-02-13: qty 90, 90d supply, fill #0
  Filled 2024-05-10: qty 90, 90d supply, fill #1
  Filled 2024-08-09: qty 90, 90d supply, fill #2
  Filled 2024-11-05: qty 90, 90d supply, fill #3

## 2024-02-13 MED ORDER — NITROGLYCERIN 0.4 MG SL SUBL
0.8000 mg | SUBLINGUAL_TABLET | Freq: Once | SUBLINGUAL | Status: AC
  Administered 2024-02-13: 0.8 mg via SUBLINGUAL

## 2024-02-13 MED ORDER — IOHEXOL 350 MG/ML SOLN
100.0000 mL | Freq: Once | INTRAVENOUS | Status: AC | PRN
  Administered 2024-02-13: 100 mL via INTRAVENOUS

## 2024-02-13 MED ORDER — NITROGLYCERIN 0.4 MG SL SUBL
SUBLINGUAL_TABLET | SUBLINGUAL | Status: AC
  Filled 2024-02-13: qty 2

## 2024-02-16 ENCOUNTER — Encounter: Payer: Self-pay | Admitting: Cardiovascular Disease

## 2024-03-01 DIAGNOSIS — G4733 Obstructive sleep apnea (adult) (pediatric): Secondary | ICD-10-CM | POA: Diagnosis not present

## 2024-03-03 ENCOUNTER — Other Ambulatory Visit (HOSPITAL_BASED_OUTPATIENT_CLINIC_OR_DEPARTMENT_OTHER): Payer: Self-pay

## 2024-03-03 ENCOUNTER — Other Ambulatory Visit: Payer: Self-pay | Admitting: Family Medicine

## 2024-03-03 MED ORDER — FLUTICASONE PROPIONATE 50 MCG/ACT NA SUSP
2.0000 | Freq: Every day | NASAL | 1 refills | Status: DC
  Filled 2024-03-03 – 2024-03-04 (×2): qty 16, 30d supply, fill #0
  Filled 2024-04-02: qty 16, 30d supply, fill #1

## 2024-03-04 ENCOUNTER — Other Ambulatory Visit (HOSPITAL_BASED_OUTPATIENT_CLINIC_OR_DEPARTMENT_OTHER): Payer: Self-pay

## 2024-03-04 ENCOUNTER — Other Ambulatory Visit: Payer: Self-pay

## 2024-03-09 ENCOUNTER — Other Ambulatory Visit (HOSPITAL_BASED_OUTPATIENT_CLINIC_OR_DEPARTMENT_OTHER): Payer: Self-pay

## 2024-03-09 DIAGNOSIS — Z780 Asymptomatic menopausal state: Secondary | ICD-10-CM | POA: Diagnosis not present

## 2024-03-09 DIAGNOSIS — Z01411 Encounter for gynecological examination (general) (routine) with abnormal findings: Secondary | ICD-10-CM | POA: Diagnosis not present

## 2024-03-09 DIAGNOSIS — Z1389 Encounter for screening for other disorder: Secondary | ICD-10-CM | POA: Diagnosis not present

## 2024-03-09 DIAGNOSIS — R8290 Unspecified abnormal findings in urine: Secondary | ICD-10-CM | POA: Diagnosis not present

## 2024-03-09 DIAGNOSIS — Z130 Encounter for screening for diseases of the blood and blood-forming organs and certain disorders involving the immune mechanism: Secondary | ICD-10-CM | POA: Diagnosis not present

## 2024-03-09 MED ORDER — NITROFURANTOIN MONOHYD MACRO 100 MG PO CAPS
100.0000 mg | ORAL_CAPSULE | Freq: Two times a day (BID) | ORAL | 0 refills | Status: DC
  Filled 2024-03-09: qty 14, 7d supply, fill #0

## 2024-03-15 ENCOUNTER — Other Ambulatory Visit (HOSPITAL_BASED_OUTPATIENT_CLINIC_OR_DEPARTMENT_OTHER): Payer: Self-pay

## 2024-03-15 MED ORDER — AMPICILLIN 500 MG PO CAPS
500.0000 mg | ORAL_CAPSULE | Freq: Four times a day (QID) | ORAL | 0 refills | Status: AC
  Filled 2024-03-15 – 2024-03-17 (×2): qty 28, 7d supply, fill #0

## 2024-03-16 ENCOUNTER — Encounter (HOSPITAL_BASED_OUTPATIENT_CLINIC_OR_DEPARTMENT_OTHER): Payer: Self-pay

## 2024-03-16 ENCOUNTER — Other Ambulatory Visit (HOSPITAL_BASED_OUTPATIENT_CLINIC_OR_DEPARTMENT_OTHER): Payer: Self-pay

## 2024-03-17 ENCOUNTER — Other Ambulatory Visit (HOSPITAL_BASED_OUTPATIENT_CLINIC_OR_DEPARTMENT_OTHER): Payer: Self-pay

## 2024-04-02 ENCOUNTER — Other Ambulatory Visit (HOSPITAL_BASED_OUTPATIENT_CLINIC_OR_DEPARTMENT_OTHER): Payer: Self-pay

## 2024-04-02 ENCOUNTER — Other Ambulatory Visit: Payer: Self-pay | Admitting: Gastroenterology

## 2024-04-06 ENCOUNTER — Other Ambulatory Visit (HOSPITAL_BASED_OUTPATIENT_CLINIC_OR_DEPARTMENT_OTHER): Payer: Self-pay

## 2024-04-07 ENCOUNTER — Other Ambulatory Visit (HOSPITAL_BASED_OUTPATIENT_CLINIC_OR_DEPARTMENT_OTHER): Payer: Self-pay

## 2024-04-08 ENCOUNTER — Telehealth: Payer: Self-pay | Admitting: Gastroenterology

## 2024-04-08 ENCOUNTER — Encounter (HOSPITAL_BASED_OUTPATIENT_CLINIC_OR_DEPARTMENT_OTHER): Payer: Self-pay

## 2024-04-08 ENCOUNTER — Other Ambulatory Visit (HOSPITAL_BASED_OUTPATIENT_CLINIC_OR_DEPARTMENT_OTHER): Payer: Self-pay

## 2024-04-08 MED ORDER — URSODIOL 500 MG PO TABS
500.0000 mg | ORAL_TABLET | Freq: Three times a day (TID) | ORAL | 0 refills | Status: DC
  Filled 2024-04-08: qty 270, 90d supply, fill #0

## 2024-04-08 NOTE — Telephone Encounter (Signed)
 Done

## 2024-04-08 NOTE — Telephone Encounter (Signed)
 Inbound call from patient, requesting refill for URSO 500mg  sent to [facility], patient scheduled follow up with Deanna May for 4/30 at 3:00 PM.

## 2024-04-21 ENCOUNTER — Ambulatory Visit: Admitting: Gastroenterology

## 2024-04-21 ENCOUNTER — Other Ambulatory Visit (HOSPITAL_BASED_OUTPATIENT_CLINIC_OR_DEPARTMENT_OTHER): Payer: Self-pay

## 2024-04-21 ENCOUNTER — Encounter: Payer: Self-pay | Admitting: Gastroenterology

## 2024-04-21 VITALS — BP 122/80 | HR 83 | Ht 65.0 in | Wt 257.0 lb

## 2024-04-21 DIAGNOSIS — K743 Primary biliary cirrhosis: Secondary | ICD-10-CM

## 2024-04-21 DIAGNOSIS — E559 Vitamin D deficiency, unspecified: Secondary | ICD-10-CM | POA: Diagnosis not present

## 2024-04-21 DIAGNOSIS — K219 Gastro-esophageal reflux disease without esophagitis: Secondary | ICD-10-CM

## 2024-04-21 MED ORDER — PANTOPRAZOLE SODIUM 40 MG PO TBEC: 40.0000 mg | DELAYED_RELEASE_TABLET | Freq: Every day | ORAL | Status: DC | PRN

## 2024-04-21 MED ORDER — FAMOTIDINE 10 MG PO TABS: 10.0000 mg | ORAL_TABLET | Freq: Every day | ORAL | Status: DC

## 2024-04-21 MED ORDER — URSODIOL 500 MG PO TABS
500.0000 mg | ORAL_TABLET | Freq: Three times a day (TID) | ORAL | 0 refills | Status: DC
  Filled 2024-04-21 – 2024-07-01 (×2): qty 270, 90d supply, fill #0

## 2024-04-21 NOTE — Patient Instructions (Addendum)
 Continue URSO  500mg  po TID (3/day)    Recommend weight loss  Continue Protonix  40mg  po qd. Discuss with Dr. Rodrick Clapper regarding bone density scan.  You have been scheduled for an abdominal ultrasound at [facility] (1st floor of hospital) on 04/26/24 at 10:30 am. Please arrive 30 minutes prior to your appointment for registration. Make certain not to have anything to eat or drink 6 hours prior to your appointment. Should you need to reschedule your appointment, please contact radiology at [phone]. This test typically takes about 30 minutes to perform.   _______________________________________________________  If your blood pressure at your visit was 140/90 or greater, please contact your primary care physician to follow up on this.  _______________________________________________________  If you are age 65 or older, your body mass index should be between 23-30. Your Body mass index is 42.77 kg/m. If this is out of the aforementioned range listed, please consider follow up with your Primary Care Provider.  If you are age 70 or younger, your body mass index should be between 19-25. Your Body mass index is 42.77 kg/m. If this is out of the aformentioned range listed, please consider follow up with your Primary Care Provider.   ________________________________________________________  The [facility] providers would like to encourage you to use MYCHART to communicate with providers for non-urgent requests or questions.  Due to long hold times on the telephone, sending your provider a message by [facility] may be a faster and more efficient way to get a response.  Please allow 48 business hours for a response.  Please remember that this is for non-urgent requests.  _______________________________________________________  Thank you for entrusting me with your care and choosing [facility].

## 2024-04-21 NOTE — Progress Notes (Signed)
 Chief Complaint: follow-up  Primary GI Doctor:Dr. Venice Gillis  HPI:  Patient is a 58 year old female patient with past medical history of GERD, IBS with constipation, and PBC who presents for annual follow-up.  She was last seen by Dr. Venice Gillis on 03/05/2023.  No problems. Doing great.   Her upper GI symptoms have resolved ever since she has stopped Wegovy .   Unfortunately has gained weight as below.   No heartburn with Protonix  40 mg p.o. daily.  No odynophagia or dysphagia.   She has been taking MiraLAX 17 g p.o. daily with resultant improvement in her constipation.  She has been drinking enough water.  Had negative colonoscopy except for fair prep as below   No alcohol.   No jaundice dark urine or pale stools   FH - colon ca-paternal aunt.  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 - Otherwise grossly normal colonoscopy to cecum. - No specimens collected. - Repeat colonoscopy in 5 years for screening purposes d/t quality of preparation/previous history of polyp with 2-day prep. Earlier, if clinically indicated.   EGD 05/2021 - Gastritis. Bx- neg for HP - A few gastric polyps. Resected and retrieved x 3. Bx-fundic gland polyps - Neg SB bx for celiac   Colonoscopy 05/02/2016 - One 1 mm polyp in the cecum, removed with a cold snare. Resected and retrieved. - The examination was otherwise normal on direct and retroflexion views.   EGD 09/2011: Nl    Liver Bx 06/07/2021 A. LIVER, LEFT LOBE, NEEDLE CORE BIOPSY:  - Patchy portal-based inflammation and mild to moderate lobular  inflammation and loss of bile ducts;  - Minimal fibrosis (stage 0-1 of 4)   Interval History     Patient presents for annual follow-up on GERD, IBS with constipation, and PBC.  She states overall she is doing well.  Her reflux is controlled on pantoprazole  40 mg p.o. daily.  She does admit she has continued to gain weight and has  formulated a plan for weight loss.No jaundice dark urine or pale stools    She also informs me that her Lipitor was increased from 10 mg to 20.  She states she had told her cardiologist she was having discomfort in her chest after walking therefore he also ordered CTA Coronary which was normal.   Wt Readings from Last 3 Encounters:  04/21/24 257 lb (116.6 kg)  01/28/24 249 lb (112.9 kg)  12/11/23 249 lb 6.4 oz (113.1 kg)     Past Medical History:  Diagnosis Date   Allergic state 12/08/2016   Allergy    Anxiety    Back pain    " i just have a weak loer back"    Fibroids    GERD (gastroesophageal reflux disease)    Hyperlipidemia 06/09/2017   Insulin  resistance    Joint pain    Knee pain, right 03/10/2017   Plantar fasciitis    PONV (postoperative nausea and vomiting)    just nausea    Shortness of breath    not only if  i exert myselg    Sinusitis    Sleep apnea    Sleep apnea in adult 12/26/2020   Vitamin D  deficiency 11/22/2016   Vitamin D  deficiency     Past Surgical History:  Procedure Laterality Date   COLONOSCOPY  02/2009   with Elvin Hammer normal   CYSTOSCOPY N/A 10/12/2019   Procedure: CYSTOSCOPY;  Surgeon: Sandra Crouch  Worema, DO;  Location: [facility];  Service: Gynecology;  Laterality: N/A;   ESOPHAGOGASTRODUODENOSCOPY  2012   GANGLION CYST EXCISION Right    right- wrist    NASAL SEPTUM SURGERY     NASAL SINUS SURGERY     TMJ ARTHROSCOPY     left   TOTAL ABDOMINAL HYSTERECTOMY  09/2019   TOTAL LAPAROSCOPIC HYSTERECTOMY WITH BILATERAL SALPINGO OOPHORECTOMY Bilateral 10/12/2019   Procedure: TOTAL LAPAROSCOPIC HYSTERECTOMY WITH BILATERAL SALPINGO OOPHORECTOMY, Lysis Pelvic Adhseions ;  Surgeon: Loa Riling, DO;  Location: [facility];  Service: Gynecology;  Laterality: Bilateral;    Current Outpatient Medications  Medication Sig Dispense Refill   acetaminophen  (TYLENOL ) 500 MG tablet Take 500 mg by mouth every 6 (six)  hours as needed for headache.     atorvastatin  (LIPITOR) 20 MG tablet Take 1 tablet (20 mg total) by mouth daily. 90 tablet 3   buPROPion  (WELLBUTRIN ) 75 MG tablet Take 1 tablet (75 mg total) by mouth daily. 90 tablet 1   Calcium  Carbonate-Vitamin D  (CALTRATE 600+D PO) Take 1,200 mg by mouth daily.     EPINEPHrine  (EPIPEN  2-PAK) 0.3 mg/0.3 mL IJ SOAJ injection Inject 0.3 mg into the muscle as needed for anaphylaxis (repeat in 15 minutes, if symptoms return seek care). 2 each 1   escitalopram  (LEXAPRO ) 10 MG tablet Take 1 tablet (10 mg total) by mouth daily. 90 tablet 1   famotidine  (PEPCID ) 10 MG tablet Take 1 tablet (10 mg total) by mouth daily.     fluticasone  (FLONASE ) 50 MCG/ACT nasal spray Place 2 sprays into both nostrils daily. 16 g 1   Krill Oil 500 MG CAPS Take 2 capsules (1,000 mg total) by mouth daily.     metFORMIN  (GLUCOPHAGE -XR) 500 MG 24 hr tablet Take 1 tablet (500 mg total) by mouth daily with breakfast. 90 tablet 1   pantoprazole  (PROTONIX ) 40 MG tablet Take 1 tablet (40 mg total) by mouth daily as needed.     Polyethylene Glycol 3350 (MIRALAX PO) Take by mouth.     ursodiol  (URSO  FORTE) 500 MG tablet Take 1 tablet (500 mg total) by mouth 3 (three) times daily. 270 tablet 0   Vitamin D , Cholecalciferol , 25 MCG (1000 UT) TABS Take 4,000 units daily 60 tablet    No current facility-administered medications for this visit.    Allergies as of 04/21/2024 - Review Complete 04/21/2024  Allergen Reaction Noted   Coconut fatty acid Hives 10/11/2011   Codeine Hives and Itching    Mango flavoring agent (non-screening) Hives 10/11/2011   [name] Itching and Swelling    Oxycodone-acetaminophen   11/29/2009   Tussionex pennkinetic er [hydrocod poli-chlorphe poli er]  10/07/2019   Ultram [tramadol] Other (See Comments) 04/05/2014    Family History  Problem Relation Age of Onset   Hypertension Father    Diabetes Father    Hyperlipidemia Father    Breast cancer Mother 89    Cancer Mother        breast   Thyroid  disease Mother    Sleep apnea Mother    Colon cancer Paternal Grandfather        died at age 51   Cancer Paternal Grandfather        GI CA   Obesity Sister    Diabetes Sister    Hypertension Sister    Heart disease Maternal Grandmother        MI   Cancer Maternal Grandfather        lung,  smoker   Diabetes Maternal Grandfather    Diabetes Paternal Grandmother    Heart disease Paternal Grandmother    Pancreatitis Sister    Esophageal cancer Neg Hx    Stomach cancer Neg Hx    Stroke Neg Hx    Colon polyps Neg Hx    Rectal cancer Neg Hx    Pancreatic cancer Neg Hx     Review of Systems:    Constitutional: No weight loss, fever, chills, weakness or fatigue HEENT: Eyes: No change in vision               Ears, Nose, Throat:  No change in hearing or congestion Skin: No rash or itching Cardiovascular: No chest pain, chest pressure or palpitations   Respiratory: No SOB or cough Gastrointestinal: See HPI and otherwise negative Genitourinary: No dysuria or change in urinary frequency Neurological: No headache, dizziness or syncope Musculoskeletal: No new muscle or joint pain Hematologic: No bleeding or bruising Psychiatric: No history of depression or anxiety    Physical Exam:  Vital signs: BP 122/80   Pulse 83   Ht 5\' 5"  (1.651 m)   Wt 257 lb (116.6 kg)   LMP 11/22/2018 (Approximate)   BMI 42.77 kg/m   Constitutional:   Pleasant  female appears to be in NAD, Well developed, Well nourished, alert and cooperative Throat: Oral cavity and pharynx without inflammation, swelling or lesion.  Respiratory: Respirations even and unlabored. Lungs clear to auscultation bilaterally.   No wheezes, crackles, or rhonchi.  Cardiovascular: Normal S1, S2. Regular rate and rhythm. No peripheral edema, cyanosis or pallor.  Gastrointestinal:  Soft, nondistended, nontender. No rebound or guarding. Normal bowel sounds. No appreciable masses or  hepatomegaly. Rectal:  Not performed.  Msk:  Symmetrical without gross deformities. Without edema, no deformity or joint abnormality.  Neurologic:  Alert and  oriented x4;  grossly normal neurologically.  Skin:   Dry and intact without significant lesions or rashes. Psychiatric: Oriented to person, place and time. Demonstrates good judgement and reason without abnormal affect or behaviors.  RELEVANT LABS AND IMAGING: CBC    Latest Ref Rng & Units 11/18/2023    3:10 PM 03/05/2023    9:05 AM 11/11/2022    3:53 PM  CBC  WBC 4.0 - 10.5 K/uL 11.5  7.3  8.7   Hemoglobin 12.0 - 15.0 g/dL 55.7  32.2  02.5   Hematocrit 36.0 - 46.0 % 40.6  40.7  40.1   Platelets 150.0 - 400.0 K/uL 359.0  364.0  364.0      CMP     Latest Ref Rng & Units 01/29/2024    8:36 AM 11/18/2023    3:10 PM 03/05/2023    9:05 AM  CMP  Glucose 70 - 99 mg/dL 427  90  98   BUN 6 - 24 mg/dL 13  12  15    Creatinine 0.57 - 1.00 mg/dL 0.62  3.76  2.83   Sodium 134 - 144 mmol/L 139  140  138   Potassium 3.5 - 5.2 mmol/L 4.4  3.9  4.2   Chloride 96 - 106 mmol/L 102  103  104   CO2 20 - 29 mmol/L 21  30  25    Calcium  8.7 - 10.2 mg/dL 9.6  9.6  9.1   Total Protein 6.0 - 8.3 g/dL  6.9  6.8   Total Bilirubin 0.2 - 1.2 mg/dL  0.3  0.4   Alkaline Phos 39 - 117 U/L  148  132  AST 0 - 37 U/L  13  16   ALT 0 - 35 U/L  18  20      Lab Results  Component Value Date   TSH 1.55 11/18/2023   03/05/23- AFP tumor marker 1.2  03/11/23 US  abdomen complete  IMPRESSION: 1. No cholelithiasis or sonographic evidence for acute cholecystitis. 2. Increased hepatic parenchymal echogenicity suggestive of steatosis. Assessment: Encounter Diagnoses  Name Primary?   Vitamin D  deficiency Yes   Primary biliary cholangitis (HCC)    Gastroesophageal reflux disease, unspecified whether esophagitis present   Patient's GERD is controlled on Protonix  40 mg p.o. daily no need to any changes.  Prescription refilled [employer] IBS constipation is  well-controlled with over-the-counter MiraLAX.  No issues currently with constipation, abdominal pain, or blood in stool.  Repeat colonoscopy 05/2026.  Patients PBC (elevated Alk phos, +AMA, Liver Bx 05/2021 c/w PBC, No cirrhosis) she is up-to-date on lab work.  Will order abdominal ultrasound.  Encouraged weight loss.  If she continues to gain weight we Roshell Brigham have to address her ursodiol  dosage. She will discuss with PCP regarding bone density scan. Plan: -Continue URSO  500mg  po TID (3/day)    -Wt loss  -Continue Protonix  40mg  po qd. -US  abdo - She will also get in touch with Dr. Rodrick Clapper regarding bone density scan. -FU in 1 yr. With Dr. Venice Gillis -Rpt screening colonoscopy 05/2026   Thank you for the courtesy of this consult. Please call me with any questions or concerns.   Xue Low, FNP-C [facility] 04/21/2024, 3:44 PM  Cc: Neda Balk, MD

## 2024-04-22 ENCOUNTER — Other Ambulatory Visit (HOSPITAL_BASED_OUTPATIENT_CLINIC_OR_DEPARTMENT_OTHER): Payer: Self-pay

## 2024-04-26 ENCOUNTER — Ambulatory Visit

## 2024-04-26 ENCOUNTER — Ambulatory Visit (HOSPITAL_COMMUNITY)

## 2024-04-26 DIAGNOSIS — K8309 Other cholangitis: Secondary | ICD-10-CM | POA: Diagnosis not present

## 2024-04-26 DIAGNOSIS — K219 Gastro-esophageal reflux disease without esophagitis: Secondary | ICD-10-CM

## 2024-04-26 DIAGNOSIS — E559 Vitamin D deficiency, unspecified: Secondary | ICD-10-CM

## 2024-04-26 DIAGNOSIS — K743 Primary biliary cirrhosis: Secondary | ICD-10-CM

## 2024-05-03 ENCOUNTER — Other Ambulatory Visit: Payer: Self-pay | Admitting: Family Medicine

## 2024-05-03 ENCOUNTER — Other Ambulatory Visit (HOSPITAL_BASED_OUTPATIENT_CLINIC_OR_DEPARTMENT_OTHER): Payer: Self-pay

## 2024-05-03 MED ORDER — FLUTICASONE PROPIONATE 50 MCG/ACT NA SUSP
2.0000 | Freq: Every day | NASAL | 1 refills | Status: DC
  Filled 2024-05-03: qty 16, 30d supply, fill #0
  Filled 2024-05-26 – 2024-05-28 (×2): qty 16, 30d supply, fill #1

## 2024-05-04 ENCOUNTER — Ambulatory Visit: Payer: Self-pay | Admitting: Gastroenterology

## 2024-05-05 ENCOUNTER — Other Ambulatory Visit: Payer: Self-pay

## 2024-05-05 DIAGNOSIS — K743 Primary biliary cirrhosis: Secondary | ICD-10-CM

## 2024-05-05 DIAGNOSIS — K760 Fatty (change of) liver, not elsewhere classified: Secondary | ICD-10-CM

## 2024-05-05 DIAGNOSIS — K7460 Unspecified cirrhosis of liver: Secondary | ICD-10-CM

## 2024-05-05 NOTE — Telephone Encounter (Signed)
-----   Message from Devin Foerster May sent at 05/04/2024 10:06 AM EDT ----- Hailey Noble-  Patient needs following labs now- AFP, PT INR  (dx: primary biliary cholangitis, fatty liver, cirrhosis) FU in 6 months with Dr. Venice Gillis.    Deanna

## 2024-05-11 ENCOUNTER — Telehealth: Payer: Self-pay | Admitting: Gastroenterology

## 2024-05-11 NOTE — Telephone Encounter (Signed)
 Patient is calling back to find out if her labs have been order and if so is there a way they can be sent over to [facility] [place] since she is currently working over there.Patient is also wanting to know when she needs to schedule for a follow up. Patient is requesting a call back. Please advise.

## 2024-05-13 ENCOUNTER — Other Ambulatory Visit: Payer: Self-pay

## 2024-05-13 DIAGNOSIS — K581 Irritable bowel syndrome with constipation: Secondary | ICD-10-CM

## 2024-05-13 DIAGNOSIS — K760 Fatty (change of) liver, not elsewhere classified: Secondary | ICD-10-CM

## 2024-05-13 DIAGNOSIS — K7460 Unspecified cirrhosis of liver: Secondary | ICD-10-CM

## 2024-05-13 DIAGNOSIS — K743 Primary biliary cirrhosis: Secondary | ICD-10-CM

## 2024-05-13 DIAGNOSIS — E559 Vitamin D deficiency, unspecified: Secondary | ICD-10-CM

## 2024-05-13 DIAGNOSIS — K219 Gastro-esophageal reflux disease without esophagitis: Secondary | ICD-10-CM

## 2024-05-13 DIAGNOSIS — R1013 Epigastric pain: Secondary | ICD-10-CM

## 2024-05-13 NOTE — Progress Notes (Signed)
 Lab orders

## 2024-05-18 ENCOUNTER — Other Ambulatory Visit (HOSPITAL_BASED_OUTPATIENT_CLINIC_OR_DEPARTMENT_OTHER): Payer: Self-pay

## 2024-05-18 ENCOUNTER — Other Ambulatory Visit: Payer: Self-pay | Admitting: Family Medicine

## 2024-05-18 MED ORDER — BUPROPION HCL 75 MG PO TABS
75.0000 mg | ORAL_TABLET | Freq: Every day | ORAL | 1 refills | Status: DC
  Filled 2024-05-18: qty 90, 90d supply, fill #0
  Filled 2024-08-16: qty 90, 90d supply, fill #1

## 2024-05-18 NOTE — Assessment & Plan Note (Signed)
 Supplement and monitor

## 2024-05-20 ENCOUNTER — Encounter: Payer: Self-pay | Admitting: Family Medicine

## 2024-05-20 ENCOUNTER — Other Ambulatory Visit (HOSPITAL_BASED_OUTPATIENT_CLINIC_OR_DEPARTMENT_OTHER): Payer: Self-pay

## 2024-05-20 ENCOUNTER — Ambulatory Visit: Payer: Commercial Managed Care - PPO | Admitting: Family Medicine

## 2024-05-20 VITALS — BP 134/86 | HR 83 | Resp 16 | Ht 65.0 in | Wt 258.8 lb

## 2024-05-20 DIAGNOSIS — L989 Disorder of the skin and subcutaneous tissue, unspecified: Secondary | ICD-10-CM | POA: Diagnosis not present

## 2024-05-20 DIAGNOSIS — K8309 Other cholangitis: Secondary | ICD-10-CM

## 2024-05-20 DIAGNOSIS — E7849 Other hyperlipidemia: Secondary | ICD-10-CM | POA: Diagnosis not present

## 2024-05-20 DIAGNOSIS — E88819 Insulin resistance, unspecified: Secondary | ICD-10-CM | POA: Diagnosis not present

## 2024-05-20 DIAGNOSIS — E559 Vitamin D deficiency, unspecified: Secondary | ICD-10-CM

## 2024-05-20 DIAGNOSIS — K7460 Unspecified cirrhosis of liver: Secondary | ICD-10-CM | POA: Diagnosis not present

## 2024-05-20 DIAGNOSIS — L578 Other skin changes due to chronic exposure to nonionizing radiation: Secondary | ICD-10-CM | POA: Diagnosis not present

## 2024-05-20 DIAGNOSIS — E669 Obesity, unspecified: Secondary | ICD-10-CM

## 2024-05-20 DIAGNOSIS — D72829 Elevated white blood cell count, unspecified: Secondary | ICD-10-CM

## 2024-05-20 DIAGNOSIS — R739 Hyperglycemia, unspecified: Secondary | ICD-10-CM | POA: Diagnosis not present

## 2024-05-20 DIAGNOSIS — M545 Low back pain, unspecified: Secondary | ICD-10-CM | POA: Diagnosis not present

## 2024-05-20 DIAGNOSIS — R748 Abnormal levels of other serum enzymes: Secondary | ICD-10-CM

## 2024-05-20 MED ORDER — TRETINOIN 0.1 % EX CREA
TOPICAL_CREAM | Freq: Every day | CUTANEOUS | 1 refills | Status: AC
  Filled 2024-05-20 – 2024-05-21 (×2): qty 45, 30d supply, fill #0
  Filled 2024-06-16: qty 45, 30d supply, fill #1

## 2024-05-20 NOTE — Patient Instructions (Addendum)
 Live to [employer] the [employer] the [employer] on Netflix  MIND or [place]  NOOM and [employer]

## 2024-05-21 ENCOUNTER — Encounter: Payer: Self-pay | Admitting: Family Medicine

## 2024-05-21 ENCOUNTER — Other Ambulatory Visit (HOSPITAL_BASED_OUTPATIENT_CLINIC_OR_DEPARTMENT_OTHER): Payer: Self-pay

## 2024-05-21 ENCOUNTER — Other Ambulatory Visit: Payer: Self-pay

## 2024-05-21 DIAGNOSIS — K7460 Unspecified cirrhosis of liver: Secondary | ICD-10-CM | POA: Insufficient documentation

## 2024-05-21 NOTE — Progress Notes (Signed)
 Subjective:    Patient ID: Hailey Noble, female    DOB: [birth year], 58 y.o.   MRN: [id number]  Chief Complaint  Patient presents with   Medical Management of Chronic Issues    Patient presents today for a 6 month follow-up   Quality Metric Gaps    Pneumococcal     HPI Patient is in today for follow up on chronic medical concerns. No recent febrile illness or hospitalizations. Denies CP/palp/SOB/HA/congestion/fevers/GI or GU c/o. Taking meds as prescribed. Her father has recently been diagnosed with stage 3 melanoma and is receiving care in [place] so she is having to help care for and transport him at times. Has added a new layer of worry to her life and she is requesting a referral to dermatology for routine evaluation and also a lesion on her left cheek that has been present for at least a couple of months and she has picked at a bit. She is frustrated with her weight and is considering paying for Zepbound since she is not a diabetic to date. No complaints of polyuria or polydipsia.   Past Medical History:  Diagnosis Date   Allergic state 12/08/2016   Allergy    Anxiety    Back pain    " i just have a weak loer back"    Fibroids    GERD (gastroesophageal reflux disease)    Hyperlipidemia 06/09/2017   Insulin  resistance    Joint pain    Knee pain, right 03/10/2017   Plantar fasciitis    PONV (postoperative nausea and vomiting)    just nausea    Shortness of breath    not only if  i exert myselg    Sinusitis    Sleep apnea    Sleep apnea in adult 12/26/2020   Vitamin D  deficiency 11/22/2016   Vitamin D  deficiency     Past Surgical History:  Procedure Laterality Date   COLONOSCOPY  02/2009   with Elvin Hammer normal   CYSTOSCOPY N/A 10/12/2019   Procedure: CYSTOSCOPY;  Surgeon: Loa Riling, DO;  Location: [facility];  Service: Gynecology;  Laterality: N/A;   ESOPHAGOGASTRODUODENOSCOPY  2012   GANGLION CYST EXCISION Right    right- wrist    NASAL  SEPTUM SURGERY     NASAL SINUS SURGERY     TMJ ARTHROSCOPY     left   TOTAL ABDOMINAL HYSTERECTOMY  09/2019   TOTAL LAPAROSCOPIC HYSTERECTOMY WITH BILATERAL SALPINGO OOPHORECTOMY Bilateral 10/12/2019   Procedure: TOTAL LAPAROSCOPIC HYSTERECTOMY WITH BILATERAL SALPINGO OOPHORECTOMY, Lysis Pelvic Adhseions ;  Surgeon: Loa Riling, DO;  Location: [facility];  Service: Gynecology;  Laterality: Bilateral;    Family History  Problem Relation Age of Onset   Hypertension Father    Diabetes Father    Hyperlipidemia Father    Breast cancer Mother 30   Cancer Mother        breast   Thyroid  disease Mother    Sleep apnea Mother    Colon cancer Paternal Grandfather        died at age 43   Cancer Paternal Grandfather        GI CA   Obesity Sister    Diabetes Sister    Hypertension Sister    Heart disease Maternal Grandmother        MI   Cancer Maternal Grandfather        lung, smoker   Diabetes Maternal Grandfather    Diabetes Paternal Grandmother  Heart disease Paternal Grandmother    Pancreatitis Sister    Esophageal cancer Neg Hx    Stomach cancer Neg Hx    Stroke Neg Hx    Colon polyps Neg Hx    Rectal cancer Neg Hx    Pancreatic cancer Neg Hx     Social History   Socioeconomic History   Marital status: Married    Spouse name: Myrtie Atkinson   Number of children: Not on file   Years of education: Not on file   Highest education level: Master's degree (e.g., MA, MS, MEng, MEd, MSW, MBA)  Occupational History   Occupation: Teacher, adult education: [employer]  Tobacco Use   Smoking status: Never   Smokeless tobacco: Never  Vaping Use   Vaping status: Never Used  Substance and Sexual Activity   Alcohol use: Yes    Alcohol/week: 0.0 standard drinks of alcohol    Comment: rare   Drug use: No   Sexual activity: Yes    Birth control/protection: I.U.D.  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 Works at [employer]. No dietary restrictions. Lives with dog      Right Handed   1 cup of Coffee per Day   Social Drivers of Health   Financial Resource Strain: Low Risk  (12/11/2023)   Overall Financial Resource Strain (CARDIA)    Difficulty of Paying Living Expenses: Not hard at all  Food Insecurity: No Food Insecurity (12/11/2023)   Hunger Vital Sign    Worried About Running Out of Food in the Last Year: Never true    Ran Out of Food in the Last Year: Never true  Transportation Needs: No Transportation Needs (12/11/2023)   PRAPARE - Administrator, Civil Service (Medical): No    Lack of Transportation (Non-Medical): No  Physical Activity: Not on file  Stress: Not on file  Social Connections: Moderately Integrated (12/11/2023)   Social Connection and Isolation Panel [NHANES]    Frequency of Communication with Friends and Family: Twice a week    Frequency of Social Gatherings with Friends and Family: Three times a week    Attends Religious Services: 1 to 4 times per year    Active Member of Clubs or Organizations: No    Attends Engineer, structural: Not on file    Marital Status: Married  Catering manager Violence: Not on file    Outpatient Medications Prior to Visit  Medication Sig Dispense Refill   acetaminophen  (TYLENOL ) 500 MG tablet Take 500 mg by mouth every 6 (six) hours as needed for headache.     atorvastatin  (LIPITOR) 20 MG tablet Take 1 tablet (20 mg total) by mouth daily. 90 tablet 3   buPROPion  (WELLBUTRIN ) 75 MG tablet Take 1 tablet (75 mg total) by mouth daily. 90 tablet 1   Calcium  Carbonate-Vitamin D  (CALTRATE 600+D PO) Take 1,200 mg by mouth daily.     EPINEPHrine  (EPIPEN  2-PAK) 0.3 mg/0.3 mL IJ SOAJ injection Inject 0.3 mg into the muscle as needed for anaphylaxis (repeat in 15 minutes, if symptoms return seek care). 2 each 1   escitalopram  (LEXAPRO ) 10 MG tablet Take 1 tablet (10 mg total) by mouth  daily. 90 tablet 1   famotidine  (PEPCID ) 10 MG tablet Take 1 tablet (10 mg total) by mouth daily.     fluticasone  (FLONASE ) 50 MCG/ACT nasal spray Place 2  sprays into both nostrils daily. 16 g 1   Krill Oil 500 MG CAPS Take 2 capsules (1,000 mg total) by mouth daily.     metFORMIN  (GLUCOPHAGE -XR) 500 MG 24 hr tablet Take 1 tablet (500 mg total) by mouth daily with breakfast. 90 tablet 1   pantoprazole  (PROTONIX ) 40 MG tablet Take 1 tablet (40 mg total) by mouth daily as needed.     Polyethylene Glycol 3350 (MIRALAX PO) Take by mouth.     ursodiol  (URSO  FORTE) 500 MG tablet Take 1 tablet (500 mg total) by mouth 3 (three) times daily. 270 tablet 0   Vitamin D , Cholecalciferol , 25 MCG (1000 UT) TABS Take 4,000 units daily 60 tablet    No facility-administered medications prior to visit.    Allergies  Allergen Reactions   Coconut Fatty Acid Hives   Codeine Hives and Itching   Mango Flavoring Agent (Non-Screening) Hives   [name] Itching and Swelling   Oxycodone-Acetaminophen      rash on face and head, itching   Tussionex Pennkinetic Er [Hydrocod Poli-Chlorphe Poli Er]     rash on face and head, itching   Ultram [Tramadol] Other (See Comments)    Dizzy and low blood pressure.     Review of Systems  Constitutional:  Negative for fever and malaise/fatigue.  HENT:  Negative for congestion.   Eyes:  Negative for blurred vision.  Respiratory:  Negative for shortness of breath.   Cardiovascular:  Negative for chest pain, palpitations and leg swelling.  Gastrointestinal:  Negative for abdominal pain, blood in stool and nausea.  Genitourinary:  Negative for dysuria and frequency.  Musculoskeletal:  Negative for falls.  Skin:  Negative for rash.  Neurological:  Negative for dizziness, loss of consciousness and headaches.  Endo/Heme/Allergies:  Negative for environmental allergies.  Psychiatric/Behavioral:  Negative for depression. The patient is nervous/anxious.        Objective:      Physical Exam Constitutional:      General: She is not in acute distress.    Appearance: Normal appearance. She is well-developed. She is not toxic-appearing.  HENT:     Head: Normocephalic and atraumatic.     Right Ear: External ear normal.     Left Ear: External ear normal.     Nose: Nose normal.  Eyes:     General:        Right eye: No discharge.        Left eye: No discharge.     Conjunctiva/sclera: Conjunctivae normal.  Neck:     Thyroid : No thyromegaly.  Cardiovascular:     Rate and Rhythm: Normal rate and regular rhythm.     Heart sounds: Normal heart sounds. No murmur heard. Pulmonary:     Effort: Pulmonary effort is normal. No respiratory distress.     Breath sounds: Normal breath sounds.  Abdominal:     General: Bowel sounds are normal.     Palpations: Abdomen is soft.     Tenderness: There is no abdominal tenderness. There is no guarding.  Musculoskeletal:        General: Normal range of motion.     Cervical back: Neck supple.  Lymphadenopathy:     Cervical: No cervical adenopathy.  Skin:    General: Skin is warm and dry.     Findings: Lesion present.     Comments: Lesion anterior to left ear. Raised with scab on top roughly 1 cm in diameter  Neurological:     Mental Status: She is alert  and oriented to person, place, and time.  Psychiatric:        Mood and Affect: Mood normal.        Behavior: Behavior normal.        Thought Content: Thought content normal.        Judgment: Judgment normal.     BP 134/86   Pulse 83   Resp 16   Ht 5\' 5"  (1.651 m)   Wt 258 lb 12.8 oz (117.4 kg)   LMP 11/22/2018 (Approximate)   SpO2 96%   BMI 43.07 kg/m  Wt Readings from Last 3 Encounters:  05/20/24 258 lb 12.8 oz (117.4 kg)  04/21/24 257 lb (116.6 kg)  01/28/24 249 lb (112.9 kg)    Diabetic Foot Exam - Simple   No data filed    Lab Results  Component Value Date   WBC 11.5 (H) 11/18/2023   HGB 13.4 11/18/2023   HCT 40.6 11/18/2023   PLT 359.0 11/18/2023    GLUCOSE 101 (H) 01/29/2024   CHOL 153 01/29/2024   TRIG 210 (H) 01/29/2024   HDL 45 01/29/2024   LDLDIRECT 98.0 03/05/2023   LDLCALC 73 01/29/2024   ALT 18 11/18/2023   AST 13 11/18/2023   NA 139 01/29/2024   K 4.4 01/29/2024   CL 102 01/29/2024   CREATININE 0.67 01/29/2024   BUN 13 01/29/2024   CO2 21 01/29/2024   TSH 1.55 11/18/2023   INR 1.0 06/07/2021   HGBA1C 5.9 11/18/2023    Lab Results  Component Value Date   TSH 1.55 11/18/2023   Lab Results  Component Value Date   WBC 11.5 (H) 11/18/2023   HGB 13.4 11/18/2023   HCT 40.6 11/18/2023   MCV 89.4 11/18/2023   PLT 359.0 11/18/2023   Lab Results  Component Value Date   NA 139 01/29/2024   K 4.4 01/29/2024   CO2 21 01/29/2024   GLUCOSE 101 (H) 01/29/2024   BUN 13 01/29/2024   CREATININE 0.67 01/29/2024   BILITOT 0.3 11/18/2023   ALKPHOS 148 (H) 11/18/2023   AST 13 11/18/2023   ALT 18 11/18/2023   PROT 6.9 11/18/2023   ALBUMIN 4.4 11/18/2023   CALCIUM  9.6 01/29/2024   ANIONGAP 8 10/08/2019   EGFR 101 01/29/2024   GFR 95.25 11/18/2023   Lab Results  Component Value Date   CHOL 153 01/29/2024   Lab Results  Component Value Date   HDL 45 01/29/2024   Lab Results  Component Value Date   LDLCALC 73 01/29/2024   Lab Results  Component Value Date   TRIG 210 (H) 01/29/2024   Lab Results  Component Value Date   CHOLHDL 3.4 01/29/2024   Lab Results  Component Value Date   HGBA1C 5.9 11/18/2023       Assessment & Plan:  Vitamin D  deficiency Assessment & Plan: Supplement and monitor   Orders: -     TSH; Future  Skin lesion Assessment & Plan: Patient referred to dermatology for further work up and patient given a prescription for Retin A for routine care of aging skin and lesions  Orders: -     Ambulatory referral to Dermatology -     TSH; Future  Sun-damaged skin Assessment & Plan: FH of father recently diagnosed with melanoma referral placed for patient to see  dermatology  Orders: -     Ambulatory referral to Dermatology -     TSH; Future  Hyperglycemia -     Comprehensive metabolic panel with GFR;  Future -     Hemoglobin A1c; Future -     TSH; Future -     Amb ref to Medical Nutrition Therapy-MNT  Other hyperlipidemia -     Lipid panel; Future -     TSH; Future  Insulin  resistance -     TSH; Future -     Amb ref to Medical Nutrition Therapy-MNT  Elevated alkaline phosphatase level -     TSH; Future  Cirrhosis of liver without ascites, unspecified hepatic cirrhosis type [facility]) Assessment & Plan: Her specialty team is requesting an AFP and a PT/INR to further evaluate her disease so I have added that to her labs today to save her a second blood draw.  Orders: -     TSH; Future -     Amb ref to Medical Nutrition Therapy-MNT  Cholangitis -     TSH; Future -     AFP tumor marker; Future -     Protime-INR; Future  Low back pain, unspecified back pain laterality, unspecified chronicity, unspecified whether sciatica present -     CBC with Differential/Platelet; Future -     TSH; Future  Leukocytosis, unspecified type -     CBC with Differential/Platelet; Future -     TSH; Future  Obesity (BMI 30-39.9) Assessment & Plan: Encouraged DASH or MIND diet, decrease po intake and increase exercise as tolerated. Needs 7-8 hours of sleep nightly. Avoid trans fats, eat small, frequent meals every 4-5 hours with lean proteins, complex carbs and healthy fats. Minimize simple carbs, high fat foods and processed foods consider the cash pay program with Zepbound. Patient will check the website and we will check labs today  Orders: -     Amb ref to Medical Nutrition Therapy-MNT  Other orders -     Tretinoin; Apply topically at bedtime.  Dispense: 45 g; Refill: 1    Randie Bustle, MD

## 2024-05-21 NOTE — Assessment & Plan Note (Signed)
 Patient referred to dermatology for further work up and patient given a prescription for Retin A for routine care of aging skin and lesions

## 2024-05-21 NOTE — Assessment & Plan Note (Signed)
 Encouraged DASH or MIND diet, decrease po intake and increase exercise as tolerated. Needs 7-8 hours of sleep nightly. Avoid trans fats, eat small, frequent meals every 4-5 hours with lean proteins, complex carbs and healthy fats. Minimize simple carbs, high fat foods and processed foods consider the cash pay program with Zepbound. Patient will check the website and we will check labs today

## 2024-05-21 NOTE — Assessment & Plan Note (Signed)
 Her specialty team is requesting an AFP and a PT/INR to further evaluate her disease so I have added that to her labs today to save her a second blood draw.

## 2024-05-21 NOTE — Assessment & Plan Note (Signed)
 FH of father recently diagnosed with melanoma referral placed for patient to see dermatology

## 2024-05-24 ENCOUNTER — Ambulatory Visit: Payer: Self-pay | Admitting: Family Medicine

## 2024-05-24 ENCOUNTER — Other Ambulatory Visit (INDEPENDENT_AMBULATORY_CARE_PROVIDER_SITE_OTHER)

## 2024-05-24 DIAGNOSIS — E559 Vitamin D deficiency, unspecified: Secondary | ICD-10-CM

## 2024-05-24 DIAGNOSIS — E7849 Other hyperlipidemia: Secondary | ICD-10-CM | POA: Diagnosis not present

## 2024-05-24 DIAGNOSIS — M545 Low back pain, unspecified: Secondary | ICD-10-CM | POA: Diagnosis not present

## 2024-05-24 DIAGNOSIS — L989 Disorder of the skin and subcutaneous tissue, unspecified: Secondary | ICD-10-CM

## 2024-05-24 DIAGNOSIS — D72829 Elevated white blood cell count, unspecified: Secondary | ICD-10-CM

## 2024-05-24 DIAGNOSIS — R739 Hyperglycemia, unspecified: Secondary | ICD-10-CM | POA: Diagnosis not present

## 2024-05-24 DIAGNOSIS — K8309 Other cholangitis: Secondary | ICD-10-CM

## 2024-05-24 DIAGNOSIS — L578 Other skin changes due to chronic exposure to nonionizing radiation: Secondary | ICD-10-CM | POA: Diagnosis not present

## 2024-05-24 DIAGNOSIS — R748 Abnormal levels of other serum enzymes: Secondary | ICD-10-CM

## 2024-05-24 DIAGNOSIS — E88819 Insulin resistance, unspecified: Secondary | ICD-10-CM

## 2024-05-24 DIAGNOSIS — K7460 Unspecified cirrhosis of liver: Secondary | ICD-10-CM | POA: Diagnosis not present

## 2024-05-24 LAB — COMPREHENSIVE METABOLIC PANEL WITH GFR
ALT: 16 U/L (ref 0–35)
AST: 15 U/L (ref 0–37)
Albumin: 4.3 g/dL (ref 3.5–5.2)
Alkaline Phosphatase: 149 U/L — ABNORMAL HIGH (ref 39–117)
BUN: 12 mg/dL (ref 6–23)
CO2: 24 meq/L (ref 19–32)
Calcium: 8.9 mg/dL (ref 8.4–10.5)
Chloride: 103 meq/L (ref 96–112)
Creatinine, Ser: 0.59 mg/dL (ref 0.40–1.20)
GFR: 98.9 mL/min (ref >60.00)
Glucose, Bld: 123 mg/dL — ABNORMAL HIGH (ref 70–99)
Potassium: 3.9 meq/L (ref 3.5–5.1)
Sodium: 138 meq/L (ref 135–145)
Total Bilirubin: 0.3 mg/dL (ref 0.2–1.2)
Total Protein: 6.9 g/dL (ref 6.0–8.3)

## 2024-05-24 LAB — CBC WITH DIFFERENTIAL/PLATELET
Basophils Absolute: 0.1 10*3/uL (ref 0.0–0.1)
Basophils Relative: 0.7 % (ref 0.0–3.0)
Eosinophils Absolute: 0.2 10*3/uL (ref 0.0–0.7)
Eosinophils Relative: 2.4 % (ref 0.0–5.0)
HCT: 40 % (ref 36.0–46.0)
Hemoglobin: 13.3 g/dL (ref 12.0–15.0)
Lymphocytes Relative: 25 % (ref 12.0–46.0)
Lymphs Abs: 2.1 10*3/uL (ref 0.7–4.0)
MCHC: 33.2 g/dL (ref 30.0–36.0)
MCV: 87.5 fl (ref 78.0–100.0)
Monocytes Absolute: 0.5 10*3/uL (ref 0.1–1.0)
Monocytes Relative: 6.4 % (ref 3.0–12.0)
Neutro Abs: 5.6 10*3/uL (ref 1.4–7.7)
Neutrophils Relative %: 65.5 % (ref 43.0–77.0)
Platelets: 335 10*3/uL (ref 150.0–400.0)
RBC: 4.57 Mil/uL (ref 3.87–5.11)
RDW: 13.6 % (ref 11.5–15.5)
WBC: 8.6 10*3/uL (ref 4.0–10.5)

## 2024-05-24 LAB — LIPID PANEL
Cholesterol: 116 mg/dL (ref 0–200)
HDL: 42.5 mg/dL (ref >39.00)
LDL Cholesterol: 31 mg/dL (ref 0–99)
NonHDL: 73.45
Total CHOL/HDL Ratio: 3
Triglycerides: 214 mg/dL — ABNORMAL HIGH (ref 0.0–149.0)
VLDL: 42.8 mg/dL — ABNORMAL HIGH (ref 0.0–40.0)

## 2024-05-24 LAB — HEMOGLOBIN A1C: Hgb A1c MFr Bld: 6 % (ref 4.6–6.5)

## 2024-05-24 LAB — PROTIME-INR
INR: 1 ratio (ref 0.8–1.0)
Prothrombin Time: 10.9 s (ref 9.6–13.1)

## 2024-05-24 LAB — TSH: TSH: 2.54 u[IU]/mL (ref 0.35–5.50)

## 2024-05-25 LAB — AFP TUMOR MARKER: AFP-Tumor Marker: 1.5 ng/mL

## 2024-05-26 ENCOUNTER — Other Ambulatory Visit (HOSPITAL_BASED_OUTPATIENT_CLINIC_OR_DEPARTMENT_OTHER): Payer: Self-pay

## 2024-05-31 ENCOUNTER — Other Ambulatory Visit (HOSPITAL_BASED_OUTPATIENT_CLINIC_OR_DEPARTMENT_OTHER): Payer: Self-pay

## 2024-05-31 ENCOUNTER — Other Ambulatory Visit: Payer: Self-pay | Admitting: Family Medicine

## 2024-05-31 MED ORDER — METFORMIN HCL ER 500 MG PO TB24
500.0000 mg | ORAL_TABLET | Freq: Every day | ORAL | 1 refills | Status: DC
  Filled 2024-05-31: qty 90, 90d supply, fill #0
  Filled 2024-08-23: qty 90, 90d supply, fill #1

## 2024-06-14 DIAGNOSIS — G4733 Obstructive sleep apnea (adult) (pediatric): Secondary | ICD-10-CM | POA: Diagnosis not present

## 2024-06-15 ENCOUNTER — Other Ambulatory Visit (HOSPITAL_BASED_OUTPATIENT_CLINIC_OR_DEPARTMENT_OTHER): Payer: Self-pay

## 2024-06-15 ENCOUNTER — Other Ambulatory Visit: Payer: Self-pay | Admitting: Family Medicine

## 2024-06-15 DIAGNOSIS — F418 Other specified anxiety disorders: Secondary | ICD-10-CM

## 2024-06-15 MED ORDER — ESCITALOPRAM OXALATE 10 MG PO TABS
10.0000 mg | ORAL_TABLET | Freq: Every day | ORAL | 0 refills | Status: DC
  Filled 2024-06-15: qty 90, 90d supply, fill #0

## 2024-06-16 ENCOUNTER — Other Ambulatory Visit (HOSPITAL_BASED_OUTPATIENT_CLINIC_OR_DEPARTMENT_OTHER): Payer: Self-pay

## 2024-06-16 ENCOUNTER — Other Ambulatory Visit: Payer: Self-pay

## 2024-06-17 ENCOUNTER — Other Ambulatory Visit (HOSPITAL_BASED_OUTPATIENT_CLINIC_OR_DEPARTMENT_OTHER): Payer: Self-pay

## 2024-06-18 ENCOUNTER — Other Ambulatory Visit (HOSPITAL_BASED_OUTPATIENT_CLINIC_OR_DEPARTMENT_OTHER): Payer: Self-pay

## 2024-06-25 ENCOUNTER — Other Ambulatory Visit: Payer: Self-pay | Admitting: Family Medicine

## 2024-06-28 ENCOUNTER — Other Ambulatory Visit (HOSPITAL_BASED_OUTPATIENT_CLINIC_OR_DEPARTMENT_OTHER): Payer: Self-pay

## 2024-06-28 MED ORDER — FLUTICASONE PROPIONATE 50 MCG/ACT NA SUSP
2.0000 | Freq: Every day | NASAL | 1 refills | Status: DC
  Filled 2024-06-28: qty 16, 30d supply, fill #0
  Filled 2024-07-26: qty 16, 30d supply, fill #1

## 2024-07-01 ENCOUNTER — Other Ambulatory Visit (HOSPITAL_BASED_OUTPATIENT_CLINIC_OR_DEPARTMENT_OTHER): Payer: Self-pay

## 2024-07-08 ENCOUNTER — Encounter: Payer: Self-pay | Admitting: Skilled Nursing Facility1

## 2024-07-08 ENCOUNTER — Encounter: Attending: Family Medicine | Admitting: Skilled Nursing Facility1

## 2024-07-08 DIAGNOSIS — E631 Imbalance of constituents of food intake: Secondary | ICD-10-CM | POA: Insufficient documentation

## 2024-07-08 NOTE — Progress Notes (Signed)
 Medical Nutrition Therapy  Appointment Start time:  4:06  Appointment End time:  4:50  Primary concerns today: healthy eating  Referral diagnosis: [employer] employee   NUTRITION ASSESSMENT   Clinical Medical Hx: OSA, anxiety, GERD, hyperlipidemia Medications: see list  Labs: A1C 6.0, triglycerides 214 Notable Signs/Symptoms: pt states she has been tired  Lifestyle & Dietary Hx  Pt states she did do DPP and has gotten married stating they are very opposite in how they eat. Pt states her mother in law struggles with alcoholism and take her dad to treatment in [place].  Pt state she does have strong sweet cravings.  Pt state she does not sleep well.  Pt state her husband goes grocery shopping and does the cooking.  Pt states her job is pretty sedentary.   Estimated daily fluid intake:  oz Supplements:  Sleep:  Stress / self-care:  Current average weekly physical activity: ADL's  24-Hr Dietary Recall First Meal: omolet or greek yogurt + honey + walnuts + berries  Snack: nuts + cranberries + apples or cheese Second Meal: salad: red onion, cucumber, peporcinis, chicken or tuna or leftovers  Snack: apple + PB2 or other fruit or god fish  Third Meal: steak, salad, baked potato or pizza and salad or eaten out Snack: yogurt + berries or frozen strawberries + water Beverages: water, unsweet tea, diet gingerbeer   NUTRITION INTERVENTION  Nutrition education (E-1) on the following topics:  Creation of balanced and diverse meals to increase the intake of nutrient-rich foods that provide essential vitamins, minerals, fiber, and phytonutrients Variety of Fruits and Vegetables: Aim for a colorful array of fruits and vegetables to ensure a wide range of nutrients. Include a mix of leafy greens, berries, citrus fruits, cruciferous vegetables, and more. Whole Grains: Choose whole grains over refined grains. Examples include brown rice, quinoa, oats, whole wheat, and barley. Lean  Proteins: Include lean sources of protein, such as poultry, fish, tofu, legumes, beans, lentils, and low-fat dairy products. Limit red and processed meats. Healthy Fats: Incorporate sources of healthy fats, including avocados, nuts, seeds, and olive oil. Limit saturated and trans fats found in fried and processed foods. Dairy or Dairy Alternatives: Choose low-fat or fat-free dairy products, or plant-based alternatives like almond or soy milk. Portion Control: Be mindful of portion sizes to avoid overeating. Pay attention to hunger and satisfaction cues. Limit Added Sugars: Minimize the consumption of sugary beverages, snacks, and desserts. Check food labels for added sugars and opt for natural sources of sweetness such as whole fruits. Hydration: Drink plenty of water throughout the day. Limit sugary drinks and excessive caffeine intake. Moderate Sodium Intake: Reduce the consumption of high-sodium foods. Use herbs and spices for flavor instead of excessive salt. Meal Planning and Preparation: Plan and prepare meals ahead of time to make healthier choices more convenient. Include a mix of food groups in each meal. Limit Processed Foods: Minimize the intake of highly processed and packaged foods that are often high in added sugars, salt, and unhealthy fats. Regular Physical Activity: Combine a healthy diet with regular physical activity for overall well-being. Aim for at least 150 minutes of moderate-intensity aerobic exercise per week, along with strength training. Moderation and Balance: Enjoy treats and indulgent foods in moderation, emphasizing balance rather than strict restriction.   Learning Style & Readiness for Change Teaching method utilized: Visual & Auditory  Demonstrated degree of understanding via: Teach Back  Barriers to learning/adherence to lifestyle change: emotional eating   Goals Established by  Pt Be intentional with your meals, stay in the moment with your  meals: ask husband to join you at the dining room table  Chew 20-30 times each bite I will actually take my 30 minute break at work; I will talk to my supervisor and make sure they know I am going to be leaving my work station for my breaks  I will walk on my treadmill 3-4 days a week; I will b line to the treadmill   MONITORING & EVALUATION Dietary intake, weekly physical activity  Next Steps  Patient is to return in 2 months.

## 2024-07-15 DIAGNOSIS — H52222 Regular astigmatism, left eye: Secondary | ICD-10-CM | POA: Diagnosis not present

## 2024-07-15 DIAGNOSIS — H524 Presbyopia: Secondary | ICD-10-CM | POA: Diagnosis not present

## 2024-07-15 DIAGNOSIS — H5201 Hypermetropia, right eye: Secondary | ICD-10-CM | POA: Diagnosis not present

## 2024-07-26 ENCOUNTER — Encounter: Payer: Self-pay | Admitting: Dermatology

## 2024-07-26 ENCOUNTER — Other Ambulatory Visit (HOSPITAL_BASED_OUTPATIENT_CLINIC_OR_DEPARTMENT_OTHER): Payer: Self-pay

## 2024-07-26 ENCOUNTER — Ambulatory Visit: Admitting: Dermatology

## 2024-07-26 VITALS — BP 159/90

## 2024-07-26 DIAGNOSIS — L578 Other skin changes due to chronic exposure to nonionizing radiation: Secondary | ICD-10-CM

## 2024-07-26 DIAGNOSIS — Z1283 Encounter for screening for malignant neoplasm of skin: Secondary | ICD-10-CM | POA: Diagnosis not present

## 2024-07-26 DIAGNOSIS — L814 Other melanin hyperpigmentation: Secondary | ICD-10-CM | POA: Diagnosis not present

## 2024-07-26 DIAGNOSIS — L239 Allergic contact dermatitis, unspecified cause: Secondary | ICD-10-CM

## 2024-07-26 DIAGNOSIS — L821 Other seborrheic keratosis: Secondary | ICD-10-CM | POA: Diagnosis not present

## 2024-07-26 DIAGNOSIS — D1801 Hemangioma of skin and subcutaneous tissue: Secondary | ICD-10-CM

## 2024-07-26 DIAGNOSIS — L918 Other hypertrophic disorders of the skin: Secondary | ICD-10-CM

## 2024-07-26 DIAGNOSIS — D229 Melanocytic nevi, unspecified: Secondary | ICD-10-CM

## 2024-07-26 MED ORDER — TRIAMCINOLONE ACETONIDE 0.1 % EX OINT
1.0000 | TOPICAL_OINTMENT | Freq: Every day | CUTANEOUS | 2 refills | Status: AC | PRN
  Filled 2024-07-26: qty 454, 30d supply, fill #0

## 2024-07-26 NOTE — Patient Instructions (Signed)

## 2024-07-26 NOTE — Progress Notes (Addendum)
      New Patient Visit   Subjective  Hailey Noble is a 58 y.o. female NEW PATIENT who presents for the following:   Total Body Skin Exam (TBSE)  The patient denies she has spots, moles and lesions to be evaluated, some may be new or changing and the patient may have concern these could be cancer. Patient has not previously been treated by a dermatologist. Denied Hx of Bx. Reports  family Hx of skin cancers (father - melanoma). Patient does apply sunscreen and/or wears protective coverings.  The following portions of the chart were reviewed this encounter and updated as appropriate: medications, allergies, medical history  Review of Systems:  No other skin or systemic complaints except as noted in HPI or Assessment and Plan.  Objective  Well appearing patient in no apparent distress; mood and affect are within normal limits.  A full examination was performed including scalp, head, eyes, ears, nose, lips, neck, chest, axillae, abdomen, back, buttocks, bilateral upper extremities, bilateral lower extremities, hands, feet, fingers, toes, fingernails, and toenails. All findings within normal limits unless otherwise noted below.   Relevant exam findings are noted in the Assessment and Plan.    Assessment & Plan   LENTIGINES, SEBORRHEIC KERATOSES, HEMANGIOMAS - Benign normal skin lesions - Benign-appearing - Call for any changes  BENIGN MELANOCYTIC NEVI - Tan-brown and/or pink-flesh-colored symmetric macules and papules - Benign appearing on exam today - Observation - Call clinic for new or changing moles - Recommend daily use of broad spectrum spf 30+ sunscreen to sun-exposed areas.   MILD ACTINIC DAMAGE - Chronic condition, secondary to cumulative UV/sun exposure - diffuse scaly erythematous macules with underlying dyspigmentation - Recommend daily broad spectrum sunscreen SPF 30+ to sun-exposed areas, reapply every 2 hours as needed.  - Staying in the shade or wearing long  sleeves, sun glasses (UVA+UVB protection) and wide brim hats (4-inch brim around the entire circumference of the hat) are also recommended for sun protection.  - Call for new or changing lesions.  ALLERGIC CONTACT DERMATITIS Exam: Scaly pink papules and/or plaques 2/2 to nettles in her garden  Treatment Plan: - Rx TAC 0.1% cream- apply BID for 2 weeks on 2 weeks off. Repeat PRN.  - Will send for patch testing in [place] in future if unresolved with topicals  Acrochordons (Skin Tags) - Fleshy, skin-colored pedunculated papules - Benign appearing.  - Observe. - If desired, they can be removed with an in office procedure that is not covered by insurance. - Please call the clinic if you notice any new or changing lesions.  SKIN CANCER SCREENING PERFORMED TODAY  Return in about 1 year (around 07/26/2025) for TBSE.   Documentation: I have reviewed the above documentation for accuracy and completeness, and I agree with the above.  I, Shirron Maranda, CMA, am acting as scribe for Rufus Holy, MD  RUFUS CHRISTELLA HOLY, MD

## 2024-08-16 ENCOUNTER — Other Ambulatory Visit (HOSPITAL_BASED_OUTPATIENT_CLINIC_OR_DEPARTMENT_OTHER): Payer: Self-pay

## 2024-08-23 ENCOUNTER — Other Ambulatory Visit: Payer: Self-pay | Admitting: Family Medicine

## 2024-08-24 ENCOUNTER — Other Ambulatory Visit (HOSPITAL_BASED_OUTPATIENT_CLINIC_OR_DEPARTMENT_OTHER): Payer: Self-pay

## 2024-08-24 ENCOUNTER — Other Ambulatory Visit: Payer: Self-pay

## 2024-08-24 MED ORDER — FLUTICASONE PROPIONATE 50 MCG/ACT NA SUSP
2.0000 | Freq: Every day | NASAL | 1 refills | Status: DC
  Filled 2024-08-24: qty 16, 30d supply, fill #0
  Filled 2024-11-02: qty 16, 30d supply, fill #1

## 2024-09-06 ENCOUNTER — Encounter: Payer: Self-pay | Admitting: Gastroenterology

## 2024-09-13 ENCOUNTER — Other Ambulatory Visit (HOSPITAL_BASED_OUTPATIENT_CLINIC_OR_DEPARTMENT_OTHER): Payer: Self-pay

## 2024-09-13 ENCOUNTER — Other Ambulatory Visit: Payer: Self-pay | Admitting: Family Medicine

## 2024-09-13 DIAGNOSIS — F418 Other specified anxiety disorders: Secondary | ICD-10-CM

## 2024-09-13 MED ORDER — ESCITALOPRAM OXALATE 10 MG PO TABS
10.0000 mg | ORAL_TABLET | Freq: Every day | ORAL | 0 refills | Status: DC
  Filled 2024-09-13: qty 90, 90d supply, fill #0

## 2024-09-14 ENCOUNTER — Ambulatory Visit: Admitting: Skilled Nursing Facility1

## 2024-09-15 DIAGNOSIS — G4733 Obstructive sleep apnea (adult) (pediatric): Secondary | ICD-10-CM | POA: Diagnosis not present

## 2024-09-17 ENCOUNTER — Other Ambulatory Visit (HOSPITAL_BASED_OUTPATIENT_CLINIC_OR_DEPARTMENT_OTHER): Payer: Self-pay

## 2024-09-17 ENCOUNTER — Other Ambulatory Visit: Payer: Self-pay | Admitting: Gastroenterology

## 2024-09-17 DIAGNOSIS — K743 Primary biliary cirrhosis: Secondary | ICD-10-CM

## 2024-09-17 MED ORDER — URSODIOL 500 MG PO TABS
500.0000 mg | ORAL_TABLET | Freq: Three times a day (TID) | ORAL | 0 refills | Status: DC
  Filled 2024-09-17: qty 270, 90d supply, fill #0

## 2024-09-20 ENCOUNTER — Other Ambulatory Visit (HOSPITAL_BASED_OUTPATIENT_CLINIC_OR_DEPARTMENT_OTHER): Payer: Self-pay

## 2024-09-20 ENCOUNTER — Other Ambulatory Visit: Payer: Self-pay

## 2024-09-21 ENCOUNTER — Other Ambulatory Visit: Payer: Self-pay

## 2024-10-18 ENCOUNTER — Encounter: Payer: Self-pay | Admitting: Family Medicine

## 2024-10-18 ENCOUNTER — Encounter (HOSPITAL_BASED_OUTPATIENT_CLINIC_OR_DEPARTMENT_OTHER): Payer: Self-pay | Admitting: *Deleted

## 2024-10-18 ENCOUNTER — Other Ambulatory Visit: Payer: Self-pay

## 2024-10-18 ENCOUNTER — Telehealth: Payer: Self-pay

## 2024-10-18 ENCOUNTER — Ambulatory Visit (HOSPITAL_BASED_OUTPATIENT_CLINIC_OR_DEPARTMENT_OTHER)
Admission: RE | Admit: 2024-10-18 | Discharge: 2024-10-18 | Disposition: A | Source: Ambulatory Visit | Attending: Family Medicine | Admitting: Family Medicine

## 2024-10-18 ENCOUNTER — Emergency Department (HOSPITAL_BASED_OUTPATIENT_CLINIC_OR_DEPARTMENT_OTHER)

## 2024-10-18 ENCOUNTER — Emergency Department (HOSPITAL_BASED_OUTPATIENT_CLINIC_OR_DEPARTMENT_OTHER)
Admission: EM | Admit: 2024-10-18 | Discharge: 2024-10-18 | Disposition: A | Discharge status: Home / Self Care | Source: Ambulatory Visit | Attending: Emergency Medicine | Admitting: Emergency Medicine

## 2024-10-18 ENCOUNTER — Other Ambulatory Visit (HOSPITAL_BASED_OUTPATIENT_CLINIC_OR_DEPARTMENT_OTHER): Payer: Self-pay

## 2024-10-18 ENCOUNTER — Ambulatory Visit: Admitting: Family Medicine

## 2024-10-18 VITALS — BP 160/90 | HR 84 | Ht 65.0 in | Wt 254.6 lb

## 2024-10-18 DIAGNOSIS — J810 Acute pulmonary edema: Secondary | ICD-10-CM

## 2024-10-18 DIAGNOSIS — R0602 Shortness of breath: Secondary | ICD-10-CM

## 2024-10-18 DIAGNOSIS — I10 Essential (primary) hypertension: Secondary | ICD-10-CM

## 2024-10-18 DIAGNOSIS — J811 Chronic pulmonary edema: Secondary | ICD-10-CM | POA: Diagnosis not present

## 2024-10-18 DIAGNOSIS — R0789 Other chest pain: Secondary | ICD-10-CM | POA: Diagnosis not present

## 2024-10-18 DIAGNOSIS — E782 Mixed hyperlipidemia: Secondary | ICD-10-CM

## 2024-10-18 DIAGNOSIS — R011 Cardiac murmur, unspecified: Secondary | ICD-10-CM

## 2024-10-18 DIAGNOSIS — J90 Pleural effusion, not elsewhere classified: Secondary | ICD-10-CM | POA: Insufficient documentation

## 2024-10-18 DIAGNOSIS — R1013 Epigastric pain: Secondary | ICD-10-CM

## 2024-10-18 DIAGNOSIS — J9811 Atelectasis: Secondary | ICD-10-CM | POA: Diagnosis not present

## 2024-10-18 DIAGNOSIS — R918 Other nonspecific abnormal finding of lung field: Secondary | ICD-10-CM | POA: Diagnosis not present

## 2024-10-18 LAB — BASIC METABOLIC PANEL WITH GFR
Anion gap: 13 (ref 5–15)
BUN: 14 mg/dL (ref 6–23)
BUN: 14 mg/dL (ref 6–20)
CO2: 28 meq/L (ref 19–32)
CO2: 23 mmol/L (ref 22–32)
Calcium: 9.2 mg/dL (ref 8.4–10.5)
Calcium: 9.4 mg/dL (ref 8.9–10.3)
Chloride: 102 meq/L (ref 96–112)
Chloride: 103 mmol/L (ref 98–111)
Creatinine, Ser: 0.63 mg/dL (ref 0.40–1.20)
Creatinine, Ser: 0.59 mg/dL (ref 0.44–1.00)
GFR, Estimated: >60 mL/min (ref >60)
GFR: 97.07 mL/min (ref >60.00)
Glucose, Bld: 122 mg/dL — ABNORMAL HIGH (ref 70–99)
Glucose, Bld: 108 mg/dL — ABNORMAL HIGH (ref 70–99)
Potassium: 3.7 meq/L (ref 3.5–5.1)
Potassium: 3.7 mmol/L (ref 3.5–5.1)
Sodium: 138 meq/L (ref 135–145)
Sodium: 139 mmol/L (ref 135–145)

## 2024-10-18 LAB — HEPATIC FUNCTION PANEL
ALT: 23 U/L (ref 0–44)
AST: 22 U/L (ref 15–41)
Albumin: 4.4 g/dL (ref 3.5–5.0)
Alkaline Phosphatase: 164 U/L — ABNORMAL HIGH (ref 38–126)
Bilirubin, Direct: 0.2 mg/dL (ref 0.0–0.2)
Indirect Bilirubin: 0.3 mg/dL (ref 0.3–0.9)
Total Bilirubin: 0.5 mg/dL (ref 0.0–1.2)
Total Protein: 7.2 g/dL (ref 6.5–8.1)

## 2024-10-18 LAB — CBC WITH DIFFERENTIAL/PLATELET
Abs Immature Granulocytes: 0.03 K/uL (ref 0.00–0.07)
Basophils Absolute: 0.1 K/uL (ref 0.0–0.1)
Basophils Relative: 1 %
Eosinophils Absolute: 0.1 K/uL (ref 0.0–0.5)
Eosinophils Relative: 1 %
HCT: 38.6 % (ref 36.0–46.0)
Hemoglobin: 12.9 g/dL (ref 12.0–15.0)
Immature Granulocytes: 0 %
Lymphocytes Relative: 22 %
Lymphs Abs: 2.6 K/uL (ref 0.7–4.0)
MCH: 29.1 pg (ref 26.0–34.0)
MCHC: 33.4 g/dL (ref 30.0–36.0)
MCV: 86.9 fL (ref 80.0–100.0)
Monocytes Absolute: 0.6 K/uL (ref 0.1–1.0)
Monocytes Relative: 5 %
Neutro Abs: 8.5 K/uL — ABNORMAL HIGH (ref 1.7–7.7)
Neutrophils Relative %: 71 %
Platelets: 346 K/uL (ref 150–400)
RBC: 4.44 MIL/uL (ref 3.87–5.11)
RDW: 13.2 % (ref 11.5–15.5)
WBC: 11.9 K/uL — ABNORMAL HIGH (ref 4.0–10.5)
nRBC: 0 % (ref 0.0–0.2)

## 2024-10-18 LAB — TROPONIN I (HIGH SENSITIVITY): High Sens Troponin I: 6 ng/L (ref 2–17)

## 2024-10-18 LAB — LIPASE, BLOOD: Lipase: 13 U/L (ref 11–51)

## 2024-10-18 LAB — TROPONIN T, HIGH SENSITIVITY: Troponin T High Sensitivity: <15 ng/L (ref 0–19)

## 2024-10-18 LAB — D-DIMER, QUANTITATIVE: D-Dimer, Quant: 0.99 ug{FEU}/mL — ABNORMAL HIGH (ref <0.50)

## 2024-10-18 LAB — PRO BRAIN NATRIURETIC PEPTIDE: Pro Brain Natriuretic Peptide: 231 pg/mL (ref <300.0)

## 2024-10-18 MED ORDER — SUCRALFATE 1 G PO TABS
1.0000 g | ORAL_TABLET | Freq: Three times a day (TID) | ORAL | 0 refills | Status: DC
  Filled 2024-10-18: qty 40, 10d supply, fill #0

## 2024-10-18 MED ORDER — LOSARTAN POTASSIUM 25 MG PO TABS
25.0000 mg | ORAL_TABLET | Freq: Every day | ORAL | 3 refills | Status: DC
  Filled 2024-10-18: qty 30, 30d supply, fill #0
  Filled 2024-11-07 – 2024-11-11 (×2): qty 30, 30d supply, fill #1
  Filled 2024-12-13 – 2024-12-16 (×2): qty 30, 30d supply, fill #2

## 2024-10-18 MED ORDER — IOHEXOL 350 MG/ML SOLN
80.0000 mL | Freq: Once | INTRAVENOUS | Status: AC | PRN
  Administered 2024-10-18: 80 mL via INTRAVENOUS

## 2024-10-18 NOTE — ED Triage Notes (Signed)
 Pt began having sob last pm and was seen by Dr. Ubaldo who did a CXR which showed small pleural effusion and pulmonary edema and blood work which showed an elevated D-Dimer.  Pt was sent here for CTA.

## 2024-10-18 NOTE — Telephone Encounter (Signed)
 Left detailed voicemail to let pt know that Dr. Watt reached out to Dr. Barbaraann regarding cardiac murmur and shortness of breath requesting echocardiogram. No pre-cert is needed. Scheduled pt for tomorrow at 8:20am. Will attempt to reach out today before EOD.

## 2024-10-18 NOTE — ED Provider Notes (Signed)
 [facility] Provider Note   CSN: [id number] Arrival date & time: 10/18/24  1659     Patient presents with: Shortness of Breath and Abnormal Lab   Hailey Noble is a 58 y.o. female.   Patient here for evaluation of shortness of breath.  She was seen by her primary care doctor today had blood work done including a D-dimer that was elevated and sent here for PE scan.  She has been having some shortness of breath on and off.  She had a small pleural effusion and some edema on her chest x-ray was told to come for evaluation but she does have history of cirrhosis, high cholesterol.  She denies any abdominal pain chest pain fever chills cough sputum production.  Supposed maybe a heart ultrasound tomorrow.  But may be reflux process.  Occurred last night.  She felt better some now.  Maybe a little bit of discomfort but feeling better now.  The history is provided by the patient.       Prior to Admission medications   Medication Sig Start Date End Date Taking? Authorizing Provider  acetaminophen  (TYLENOL ) 500 MG tablet Take 500 mg by mouth every 6 (six) hours as needed for headache.    [provider]  atorvastatin  (LIPITOR) 20 MG tablet Take 1 tablet (20 mg total) by mouth daily. 02/13/24   O'NealDarryle Ned, MD  buPROPion  (WELLBUTRIN ) 75 MG tablet Take 1 tablet (75 mg total) by mouth daily. 05/18/24   Domenica Hailey LABOR, MD  Calcium  Carbonate-Vitamin D  (CALTRATE 600+D PO) Take 1,200 mg by mouth daily.    [provider]  EPINEPHrine  (EPIPEN  2-PAK) 0.3 mg/0.3 mL IJ SOAJ injection Inject 0.3 mg into the muscle as needed for anaphylaxis (repeat in 15 minutes, if symptoms return seek care). 11/18/23   Domenica Hailey LABOR, MD  escitalopram  (LEXAPRO ) 10 MG tablet Take 1 tablet (10 mg total) by mouth daily. 09/13/24   Domenica Hailey LABOR, MD  famotidine  (PEPCID ) 10 MG tablet Take 1 tablet (10 mg total) by mouth daily. 04/21/24   May, Deanna J, NP   fluticasone  (FLONASE ) 50 MCG/ACT nasal spray Place 2 sprays into both nostrils daily. 08/24/24   Domenica Hailey LABOR, MD  Krill Oil 500 MG CAPS Take 2 capsules (1,000 mg total) by mouth daily. 11/18/23   Domenica Hailey LABOR, MD  losartan (COZAAR) 25 MG tablet Take 1 tablet (25 mg total) by mouth daily. 10/18/24   Copland, Hailey BROCKS, MD  metFORMIN  (GLUCOPHAGE -XR) 500 MG 24 hr tablet Take 1 tablet (500 mg total) by mouth daily with breakfast. 05/31/24   Domenica Hailey LABOR, MD  pantoprazole  (PROTONIX ) 40 MG tablet Take 1 tablet (40 mg total) by mouth daily as needed. 04/21/24   May, Deanna J, NP  Polyethylene Glycol 3350 (MIRALAX PO) Take by mouth.    [provider]  sucralfate (CARAFATE) 1 g tablet Take 1 tablet (1 g total) by mouth 4 (four) times daily -  with meals and at bedtime. 10/18/24   Copland, Hailey BROCKS, MD  tretinoin  (RETIN-A ) 0.1 % cream Apply topically at bedtime. 05/20/24   Domenica Hailey LABOR, MD  triamcinolone  ointment (KENALOG ) 0.1 % Apply 1 Application topically daily as needed. Use for 2 weeks on 2 weeks off. 07/26/24   Alm Delon SAILOR, DO  ursodiol  (URSO  FORTE) 500 MG tablet Take 1 tablet (500 mg total) by mouth 3 (three) times daily. 09/17/24   May, Deanna J, NP  Vitamin D , Cholecalciferol , 25 MCG (1000 UT) TABS Take 4,000 units daily 03/21/21   Aguilar, Tracey, PA-C    Allergies: Coconut fatty acid, Codeine, Mango flavoring agent (non-screening), [name], Oxycodone-acetaminophen , Tussionex pennkinetic er [hydrocod poli-chlorphe poli er], and Ultram [tramadol]    Review of Systems  Updated Vital Signs BP (!) 169/72 (BP Location: Right Arm)   Pulse 90   Temp 98.5 F (36.9 C) (Oral)   Resp (!) 22   LMP 11/22/2018 (Approximate)   SpO2 94%   Physical Exam Vitals and nursing note reviewed.  Constitutional:      General: She is not in acute distress.    Appearance: She is well-developed. She is not ill-appearing.  HENT:     Head: Normocephalic and atraumatic.  Eyes:      Extraocular Movements: Extraocular movements intact.     Conjunctiva/sclera: Conjunctivae normal.     Pupils: Pupils are equal, round, and reactive to light.  Cardiovascular:     Rate and Rhythm: Normal rate and regular rhythm.     Pulses: Normal pulses.     Heart sounds: No murmur heard. Pulmonary:     Effort: Pulmonary effort is normal. No respiratory distress.     Breath sounds: Normal breath sounds. No decreased breath sounds or wheezing.  Abdominal:     Palpations: Abdomen is soft.     Tenderness: There is no abdominal tenderness.  Musculoskeletal:        General: No swelling. Normal range of motion.     Cervical back: Normal range of motion and neck supple.     Right lower leg: No edema.     Left lower leg: No edema.  Skin:    General: Skin is warm and dry.     Capillary Refill: Capillary refill takes less than 2 seconds.  Neurological:     Mental Status: She is alert.  Psychiatric:        Mood and Affect: Mood normal.     (all labs ordered are listed, but only abnormal results are displayed) Labs Reviewed  CBC WITH DIFFERENTIAL/PLATELET - Abnormal; Notable for the following components:      Result Value   WBC 11.9 (*)    Neutro Abs 8.5 (*)    All other components within normal limits  BASIC METABOLIC PANEL WITH GFR - Abnormal; Notable for the following components:   Glucose, Bld 108 (*)    All other components within normal limits  HEPATIC FUNCTION PANEL - Abnormal; Notable for the following components:   Alkaline Phosphatase 164 (*)    All other components within normal limits  PRO BRAIN NATRIURETIC PEPTIDE  LIPASE, BLOOD  TROPONIN T, HIGH SENSITIVITY    EKG: None  Radiology: CT Angio Chest PE W and/or Wo Contrast Result Date: 10/18/2024 EXAM: CTA of the Chest with contrast for PE 10/18/2024 06:32:20 PM TECHNIQUE: CTA of the chest was performed after the administration of intravenous contrast. Multiplanar reformatted images are provided for review. MIP  images are provided for review. Automated exposure control, iterative reconstruction, and/or weight based adjustment of the mA/kV was utilized to reduce the radiation dose to as low as reasonably achievable. COMPARISON: Cardiac CT 02/13/2024. CLINICAL HISTORY: FINDINGS: PULMONARY ARTERIES: Pulmonary arteries are adequately opacified for evaluation. No pulmonary embolism. Main pulmonary artery is normal in caliber. MEDIASTINUM: The heart and pericardium demonstrate no acute abnormality. There is no acute abnormality of the thoracic aorta. LYMPH NODES: No mediastinal, hilar or axillary lymphadenopathy. LUNGS AND PLEURA: There are small bilateral pleural  effusions. There are mild patchy ground-glass opacities throughout both lungs with some minimal atelectatic changes in the lung bases. There is a calcified granuloma in the right upper lobe. No pneumothorax. UPPER ABDOMEN: Limited images of the upper abdomen are unremarkable. SOFT TISSUES AND BONES: No acute bone or soft tissue abnormality. IMPRESSION: 1. No evidence of pulmonary embolism. 2. Mild patchy bilateral ground-glass opacities with minimal bibasilar atelectasis, which may reflect mild edema, infection, or inflammatory change; correlate clinically. 3. Small bilateral pleural effusions. 4. Incidental calcified granuloma in the right upper lobe, benign; no follow-up required per Fleischner Society Guidelines. Electronically signed by: Greig Pique MD 10/18/2024 07:14 PM EDT RP Workstation: [id number]   DG Chest 2 View Result Date: 10/18/2024 EXAM: 2 VIEW(S) XRAY OF THE CHEST 10/18/2024 02:25:28 PM COMPARISON: 12/11/2023 CLINICAL HISTORY: SOB. Sudden SOB and chest tightness x last night into this AM FINDINGS: LUNGS AND PLEURA: Unchanged calcified granuloma within the right upper lobe. Small pleural effusion. Mild pulmonary edema. No pneumothorax. HEART AND MEDIASTINUM: No acute abnormality of the cardiac and mediastinal silhouettes. BONES AND SOFT TISSUES: No  acute osseous abnormality. IMPRESSION: 1. Small pleural effusion and mild pulmonary edema. Electronically signed by: Waddell Calk MD 10/18/2024 03:25 PM EDT RP Workstation: [id number]     Procedures   Medications Ordered in the ED  iohexol  (OMNIPAQUE ) 350 MG/ML injection 80 mL (80 mLs Intravenous Contrast Given 10/18/24 1823)                                    Medical Decision Making Amount and/or Complexity of Data Reviewed Labs: ordered. Radiology: ordered.  Risk Prescription drug management.   Hailey Noble is here after having positive D-dimer test outpatient today.  She had normal troponin.  She has some brief discomfort overnight in her epigastric lower chest area.  She was told to start Carafate as it seemed more GI related.  She had unremarkable EKG in the clinic today.  Had a normal troponin lab work with otherwise unremarkable but her proBNP did not come back.  She also had maybe small pleural effusion and some pulmonary edema on the chest x-ray as well.  She is not having any symptoms now.  No leg swelling.  She has primary biliary cirrhosis but she is not having any abdominal pain.  She supposed to have a echocardiogram tomorrow.  Right now she is asymptomatic.  Will recheck labs including proBNP troponin hepatic function panel lipase and get a CT scan of her chest.  She is not having any abdominal pain.  No signs of volume overload on exam.  Reassuring vitals.  EKG shows sinus rhythm.  No ischemic changes.  Per my review and interpretation of labs no significant leukocytosis anemia or electrolyte abnormality.  proBNP is normal.  Troponin normal.  She is asymptomatic.  Chest x-ray showed no evidence of PE.  There are some mild patchy opacities with minimal bibasilar atelectasis.  This could reflect some mild edema infection or inflammatory change with clinically really none of these really correlate.  She is asymptomatic.  I do think that this could just be atelectatic changes.   She has an echocardiogram tomorrow.  At this time I do not think she has heart failure.  Maybe has some mild valvular heart disease that could be causing this.  Can continue outpatient workup with her cardiology team and her primary care team.  But I do not have  any concern for inflammatory infectious process.  Patient discharged in good condition.  Understands return precautions.  This chart was dictated using voice recognition software.  Despite best efforts to proofread,  errors can occur which can change the documentation meaning.      Final diagnoses:  Shortness of breath  Pleural effusion    ED Discharge Orders     None          Ruthe Cornet, DO 10/18/24 1923

## 2024-10-18 NOTE — Telephone Encounter (Signed)
 Spoke with Hailey Noble to confirm that she is aware of echo appointment made for her tomorrow at 8:20am. We discussed the location of the office and she will arrive early for her appointment. Hailey Noble states that Dr. Watt is having her do a chest CTA at [facility]. No further questions at this time.

## 2024-10-18 NOTE — Progress Notes (Addendum)
  Healthcare at [facility] [address] [place], [place] [address] [phone]  Date:  10/18/2024   Name:  Hailey Noble   DOB:  1966-06-26   MRN:  [id number]  PCP:  Domenica Harlene LABOR, MD    Chief Complaint: Shortness of Breath (Onset last night, between 9-10pm before bed I had been fine all day. I laid down and put my CPAP machine on and just felt like something was pressing right in the middle of my chest. It felt like an ice pick /I did take come pepcid  and an Albuterol  inhaler and that helped, when I move in the morning its right in my chest but now that I've been working up up right I haven't felt it at all /I slept over the end of the bad )   History of Present Illness:  Hailey Noble is a 58 y.o. very pleasant female patient who presents with the following:  Patient is in today with concern about shortness of breath.-I have not seen her myself previously She saw her primary care provider Dr. 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  Discussed the use of AI scribe software for clinical note transcription with the patient, who gave verbal consent to proceed.  History of Present Illness Hailey Noble is a 58 year old female with primary biliary cirrhosis who presents with epigastric discomfort and difficulty using her CPAP machine.  She experienced a sudden onset of epigastric/ substernal discomfort last night between 9 and 10 PM when attempting to use her CPAP machine, which she has been using for eight years without prior issues. The sensation was described as a 'hot volcano' under her sternum, leading to a feeling of suffocation when lying down. Relief was found by sitting up at a 90-degree  angle.  To alleviate the symptoms, she took her usual Protonix , an extra dose of Pepcid , and drank Mylanta. Additionally, she used an albuterol  inhaler leftover from a previous COVID-19 infection, which provided some relief. However, the discomfort returned upon lying down again. This episode was isolated to last night, and she managed to sleep by sitting on a stool and leaning forward on the bed.  Today, she feels better but notes slight chest tightness when walking long distances, such as from her workplace to the parking lot. She also used a nasal irrigation device (Novage) yesterday, which helped clear congestion. No recent illness, chest pain, radiation of pain to the arm or back, headaches, fever, chills, or vomiting.  She denies any history of blood clots, pulmonary embolism, or heart problems such as stents. She has not been using ibuprofen  recently and reports regular bowel movements.      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 04/12/2022   Skin lesion 01/30/2021   Pruritus 01/30/2021   Other rhinitis 01/30/2021   Adverse reaction to food, subsequent encounter 01/30/2021   Sleep apnea in adult 12/26/2020   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 06/01/2020   Left arm pain 06/01/2020   Peroneal tendinitis of left lower leg 02/16/2020   Leukocytosis 12/02/2019   Hyperglycemia 12/02/2019   Pain of left heel 12/02/2019   Pelvic pain 10/12/2019   Post-operative state 10/12/2019   Pain,  abdominal, RLQ 11/28/2017   Insulin  resistance 10/21/2017   Hyperlipidemia 06/09/2017   Knee pain, right 03/10/2017   Allergy 12/08/2016   Vitamin D  deficiency 11/22/2016   Depression with anxiety 04/06/2014   Obesity (BMI 30-39.9) 04/06/2014   Preventative health care 06/17/2011   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   INGUINAL PAIN, LEFT 12/05/2008    Past Medical History:  Diagnosis Date   Allergic state 12/08/2016   Allergy    Anxiety    Back pain     i just have a weak loer back    Fibroids    GERD (gastroesophageal reflux disease)    Hyperlipidemia 06/09/2017   Insulin  resistance    Joint pain    Knee pain, right 03/10/2017   Plantar fasciitis    PONV (postoperative nausea and vomiting)    just nausea    Shortness of breath    not only if  i exert myselg    Sinusitis    Sleep apnea    Sleep apnea in adult 12/26/2020   Vitamin D  deficiency 11/22/2016   Vitamin D  deficiency     Past Surgical History:  Procedure Laterality Date   COLONOSCOPY  02/2009   with Abran normal   CYSTOSCOPY N/A 10/12/2019   Procedure: CYSTOSCOPY;  Surgeon: Delana Ted Morrison, DO;  Location: [facility];  Service: Gynecology;  Laterality: N/A;   ESOPHAGOGASTRODUODENOSCOPY  2012   GANGLION CYST EXCISION Right    right- wrist    NASAL SEPTUM SURGERY     NASAL SINUS SURGERY     TMJ ARTHROSCOPY     left   TOTAL ABDOMINAL HYSTERECTOMY  09/2019   TOTAL LAPAROSCOPIC HYSTERECTOMY WITH BILATERAL SALPINGO OOPHORECTOMY Bilateral 10/12/2019   Procedure: TOTAL LAPAROSCOPIC HYSTERECTOMY WITH BILATERAL SALPINGO OOPHORECTOMY, Lysis Pelvic Adhseions ;  Surgeon: Delana Ted Morrison, DO;  Location: [facility];  Service: Gynecology;  Laterality: Bilateral;    Social History   Tobacco Use   Smoking status: Never   Smokeless tobacco: Never  Vaping Use   Vaping status: Never Used  Substance Use Topics   Alcohol use: Yes    Alcohol/week: 0.0 standard drinks of  alcohol    Comment: rare   Drug use: No    Family History  Problem Relation Age of Onset   Breast cancer Mother 54   Cancer Mother        breast   Thyroid  disease Mother    Sleep apnea Mother    Melanoma Father    Hypertension Father    Diabetes Father    Hyperlipidemia Father    Obesity Sister    Diabetes Sister    Hypertension Sister    Pancreatitis Sister    Heart disease Maternal Grandmother        MI   Cancer Maternal Grandfather        lung, smoker   Diabetes Maternal Grandfather    Diabetes Paternal Grandmother    Heart disease  Paternal Grandmother    Colon cancer Paternal Grandfather        died at age 67   Cancer Paternal Grandfather        GI CA   Esophageal cancer Neg Hx    Stomach cancer Neg Hx    Stroke Neg Hx    Colon polyps Neg Hx    Rectal cancer Neg Hx    Pancreatic cancer Neg Hx     Allergies  Allergen Reactions   Coconut Fatty Acid Hives   Codeine Hives and Itching   Mango Flavoring Agent (Non-Screening) Hives   [name] Itching and Swelling   Oxycodone-Acetaminophen      rash on face and head, itching   Tussionex Pennkinetic Er [Hydrocod Poli-Chlorphe Poli Er]     rash on face and head, itching   Ultram [Tramadol] Other (See Comments)    Dizzy and low blood pressure.     Medication list has been reviewed and updated.  Current Outpatient Medications on File Prior to Visit  Medication Sig Dispense Refill   acetaminophen  (TYLENOL ) 500 MG tablet Take 500 mg by mouth every 6 (six) hours as needed for headache.     atorvastatin  (LIPITOR) 20 MG tablet Take 1 tablet (20 mg total) by mouth daily. 90 tablet 3   buPROPion  (WELLBUTRIN ) 75 MG tablet Take 1 tablet (75 mg total) by mouth daily. 90 tablet 1   Calcium  Carbonate-Vitamin D  (CALTRATE 600+D PO) Take 1,200 mg by mouth daily.     EPINEPHrine  (EPIPEN  2-PAK) 0.3 mg/0.3 mL IJ SOAJ injection Inject 0.3 mg into the muscle as needed for anaphylaxis (repeat in 15 minutes, if symptoms return seek  care). 2 each 1   escitalopram  (LEXAPRO ) 10 MG tablet Take 1 tablet (10 mg total) by mouth daily. 90 tablet 0   famotidine  (PEPCID ) 10 MG tablet Take 1 tablet (10 mg total) by mouth daily.     fluticasone  (FLONASE ) 50 MCG/ACT nasal spray Place 2 sprays into both nostrils daily. 16 g 1   Krill Oil 500 MG CAPS Take 2 capsules (1,000 mg total) by mouth daily.     metFORMIN  (GLUCOPHAGE -XR) 500 MG 24 hr tablet Take 1 tablet (500 mg total) by mouth daily with breakfast. 90 tablet 1   pantoprazole  (PROTONIX ) 40 MG tablet Take 1 tablet (40 mg total) by mouth daily as needed.     Polyethylene Glycol 3350 (MIRALAX PO) Take by mouth.     tretinoin  (RETIN-A ) 0.1 % cream Apply topically at bedtime. 45 g 1   triamcinolone  ointment (KENALOG ) 0.1 % Apply 1 Application topically daily as needed. Use for 2 weeks on 2 weeks off. 454 g 2   ursodiol  (URSO  FORTE) 500 MG tablet Take 1 tablet (500 mg total) by mouth 3 (three) times daily. 270 tablet 0   Vitamin D , Cholecalciferol , 25 MCG (1000 UT) TABS Take 4,000 units daily 60 tablet    No current facility-administered medications on file prior to visit.    Review of Systems:  As per HPI- otherwise negative.   Physical Examination: Vitals:   10/18/24 1308  BP: (!) 152/74  Pulse: 84  SpO2: 97%   Vitals:   10/18/24 1308  Weight: 254 lb 9.6 oz (115.5 kg)  Height: 5' 5 (1.651 m)   Body mass index is 42.37 kg/m. Ideal Body Weight: Weight in (lb) to have BMI = 25: 149.9  GEN: no acute distress. Obese, looks well  HEENT: Atraumatic, Normocephalic. Bilateral TM wnl, oropharynx normal.  PEERL,EOMI.  Ears and Nose: No external deformity. CV: RRR, easily audible systolic murmur is heard, most prominent over the mitral valve.  Patient states this is a new finding per her recollection.  No JVD. No thrill. No extra heart sounds. PULM: CTA B, no wheezes, crackles, rhonchi. No retractions. No resp. distress. No accessory muscle use. ABD: S, NT, ND, +BS. No  rebound. No HSM. EXTR: No c/c/e PSYCH: Normally interactive. Conversant.   BP Readings from Last 3 Encounters:  10/18/24 (!) 152/74  07/26/24 (!) 159/90  05/20/24 134/86   DG Chest 2 View Result Date: 10/18/2024 EXAM: 2 VIEW(S) XRAY OF THE CHEST 10/18/2024 02:25:28 PM COMPARISON: 12/11/2023 CLINICAL HISTORY: SOB. Sudden SOB and chest tightness x last night into this AM FINDINGS: LUNGS AND PLEURA: Unchanged calcified granuloma within the right upper lobe. Small pleural effusion. Mild pulmonary edema. No pneumothorax. HEART AND MEDIASTINUM: No acute abnormality of the cardiac and mediastinal silhouettes. BONES AND SOFT TISSUES: No acute osseous abnormality. IMPRESSION: 1. Small pleural effusion and mild pulmonary edema. Electronically signed by: Waddell Calk MD 10/18/2024 03:25 PM EDT RP Workstation: [id number]   EKG: ST with LAE, very similar to EKG from February except for downgoing T in V2 which is new   Results for orders placed or performed in visit on 10/18/24  D-Dimer, Quantitative   Collection Time: 10/18/24  2:09 PM  Result Value Ref Range   D-Dimer, Quant 0.99 (H) <0.50 mcg/mL FEU  Troponin I (High Sensitivity)   Collection Time: 10/18/24  2:09 PM  Result Value Ref Range   High Sens Troponin I 6 2 - 17 ng/L  Basic metabolic panel with GFR   Collection Time: 10/18/24  2:16 PM  Result Value Ref Range   Sodium 138 135 - 145 mEq/L   Potassium 3.7 3.5 - 5.1 mEq/L   Chloride 102 96 - 112 mEq/L   CO2 28 19 - 32 mEq/L   Glucose, Bld 122 (H) 70 - 99 mg/dL   BUN 14 6 - 23 mg/dL   Creatinine, Ser 9.36 0.40 - 1.20 mg/dL   GFR 02.92 >39.99 mL/min   Calcium  9.2 8.4 - 10.5 mg/dL     Assessment and Plan: Chest tightness - Plan: EKG 12-Lead, D-Dimer, Quantitative, Troponin I (High Sensitivity), CBC, DG Chest 2 View, Basic metabolic panel with GFR, CANCELED: Basic metabolic panel with GFR  Epigastric pain - Plan: sucralfate (CARAFATE) 1 g tablet  Essential hypertension - Plan:  losartan (COZAAR) 25 MG tablet  Assessment & Plan Epigastric pain, originally thought due to gastritis or dyspepsia.  However, chest x-ray then showed pulmonary edema Acute epigastric pain likely due to gastritis or dyspepsia, possibly stress or diet-related. No cardiac or pulmonary symptoms. Tenderness in epigastric region noted. - Order EKG, troponin level, D-dimer, and chest X-ray to rule out cardiac causes and pulmonary embolism. - Prescribe Carafate for gastric protection.  Continue PPI and H2 blocker as needed  Newly auscultated cardiac murmur.  Her last echo showed mild mitral valve regurgitation only.  We will consider getting a repeat echo per cardiology  Noted blood pressure has been elevated last several visits, started on losartan 25   Received chest film showing effusion and pulmonary edema- reached out to cardiology and they have her set up for an echo tomorrow am However her D dimer then came back high- may be due to CHF but also consider PE.  Pt is happy to be seen at ER here at [facility] to have a CT angiogram.  We can  plan go ahead with echo tomorrow as planned If no pulmonary embolism I can start her on furosemide Also consider possibility of endocarditis-this is less likely as she has no other symptoms of illness Signed Harlene Schroeder, MD

## 2024-10-18 NOTE — Discharge Instructions (Signed)
 Turn if symptoms worsen.  Finish cardiac follow-up with echocardiogram tomorrow cardiology follow-up.  Return if symptoms worsen.

## 2024-10-19 ENCOUNTER — Ambulatory Visit: Payer: Self-pay | Admitting: Cardiovascular Disease

## 2024-10-19 ENCOUNTER — Other Ambulatory Visit (HOSPITAL_BASED_OUTPATIENT_CLINIC_OR_DEPARTMENT_OTHER): Payer: Self-pay

## 2024-10-19 ENCOUNTER — Encounter (HOSPITAL_COMMUNITY): Payer: Self-pay

## 2024-10-19 ENCOUNTER — Ambulatory Visit (HOSPITAL_COMMUNITY)
Admission: RE | Admit: 2024-10-19 | Discharge: 2024-10-19 | Disposition: A | Discharge status: Home / Self Care | Source: Ambulatory Visit | Attending: Cardiology | Admitting: Cardiology

## 2024-10-19 ENCOUNTER — Encounter (HOSPITAL_COMMUNITY): Payer: Self-pay | Admitting: Radiology

## 2024-10-19 ENCOUNTER — Encounter: Payer: Self-pay | Admitting: Family Medicine

## 2024-10-19 DIAGNOSIS — E782 Mixed hyperlipidemia: Secondary | ICD-10-CM | POA: Insufficient documentation

## 2024-10-19 DIAGNOSIS — R0602 Shortness of breath: Secondary | ICD-10-CM | POA: Insufficient documentation

## 2024-10-19 DIAGNOSIS — R011 Cardiac murmur, unspecified: Secondary | ICD-10-CM | POA: Diagnosis not present

## 2024-10-19 DIAGNOSIS — I371 Nonrheumatic pulmonary valve insufficiency: Secondary | ICD-10-CM

## 2024-10-19 DIAGNOSIS — I340 Nonrheumatic mitral (valve) insufficiency: Secondary | ICD-10-CM | POA: Diagnosis not present

## 2024-10-19 LAB — BRAIN NATRIURETIC PEPTIDE: Pro B Natriuretic peptide (BNP): 90 pg/mL (ref 0.0–100.0)

## 2024-10-19 LAB — CBC
HCT: 39.4 % (ref 36.0–46.0)
Hemoglobin: 12.9 g/dL (ref 12.0–15.0)
MCHC: 32.7 g/dL (ref 30.0–36.0)
MCV: 87.9 fl (ref 78.0–100.0)
Platelets: 342 K/uL (ref 150.0–400.0)
RBC: 4.48 Mil/uL (ref 3.87–5.11)
RDW: 14.1 % (ref 11.5–15.5)
WBC: 11.3 K/uL — ABNORMAL HIGH (ref 4.0–10.5)

## 2024-10-19 LAB — ECHOCARDIOGRAM COMPLETE
Area-P 1/2: 5.38 cm2
MV M vel: 4.77 m/s
MV Peak grad: 91 mmHg
MV VTI: 2.02 cm2
Radius: 1.1 cm
S' Lateral: 2.9 cm

## 2024-10-19 MED ORDER — FUROSEMIDE 20 MG PO TABS
20.0000 mg | ORAL_TABLET | Freq: Every day | ORAL | 1 refills | Status: DC | PRN
  Filled 2024-10-19: qty 30, 30d supply, fill #0

## 2024-10-19 NOTE — Telephone Encounter (Signed)
 Discussed Ms. Camps echocardiogram with her.  She has severe mitral valve regurgitation.  Chest CT from ER reviewed.  This shows pulmonary edema.  She is presently without any further symptoms.  I am going to prescribe her Lasix 20 mg daily as needed.  She is to take a one-time dose today.  She will see me Monday at 9 AM.  We will set her up for a TEE and left heart catheterization.  She is without any further symptoms.  Should she have any more symptoms she will call our office or present to the emergency room.  Signed, Darryle DASEN. Barbaraann, MD, [facility]  [facility]  [address] [place], [place] [address] [phone]  1:53 PM

## 2024-10-19 NOTE — Progress Notes (Signed)
 Reached out to reading physician Dr. Raford regarding critical values noted on the echocardiogram and patient's recent shortness of breath. Patient was discharged per Dr. Raford, she will follow up with the patient's cardiologist, Dr. MALVATHEORA Mulch, to discuss findings.    Augustin Seals RDCS

## 2024-10-20 ENCOUNTER — Other Ambulatory Visit (HOSPITAL_BASED_OUTPATIENT_CLINIC_OR_DEPARTMENT_OTHER): Payer: Self-pay

## 2024-10-21 ENCOUNTER — Other Ambulatory Visit: Payer: Self-pay | Admitting: Family Medicine

## 2024-10-21 DIAGNOSIS — R1013 Epigastric pain: Secondary | ICD-10-CM

## 2024-10-24 NOTE — H&P (View-Only) (Signed)
 Cardiology Office Note:  .   Date:  10/25/2024  ID:  [name], DOB 1966-07-17, MRN [id number] PCP: Domenica Harlene LABOR, MD  [facility] Providers Cardiologist:  Darryle ONEIDA Decent, MD { History of Present Illness: .    Chief Complaint  Patient presents with   Follow-up    Hailey Noble is a 58 y.o. female with history of MR who presents for follow-up.    History of Present Illness   Hailey Noble is a 58 year old female with nonobstructive coronary artery disease who presents for follow-up of severe mitral valve regurgitation.  Approximately one week ago, she experienced acute shortness of breath, and a new heart murmur was detected. An echocardiogram previously performed showed severe mitral valve regurgitation. During an episode at the beach, she was unable to sleep due to shortness of breath, describing it as feeling 'like the ocean.' No chest pain, fever, or chills. No swelling in her legs.  She is currently taking Lasix as needed but reports no significant increase in urination, possibly due to high water intake. She has been active, walking 9,000 steps a day at work, and has noticed increased shortness of breath during swimming, requiring her to come up for air more frequently.  Her mother in law recently recovered from pneumonia and has a history of lung issues, which has made her weak.          Problem List Coronary calcium   -CAC 7.1 (74th percentile) 2. HLD -T chol 163, TG 263, HDL 36, LDL 73 3. Obesity -BMI 41 4. Mitral regurgitation      ROS: All other ROS reviewed and negative. Pertinent positives noted in the HPI.     Studies Reviewed: SABRA       TTE 10/19/2024  1. Left ventricular ejection fraction, by estimation, is 60 to 65%. Left  ventricular ejection fraction by 3D volume is 61 %. The left ventricle has  normal function. The left ventricle has no regional wall motion  abnormalities. Left ventricular diastolic   function could not be evaluated.  Elevated left atrial pressure. The  average left ventricular global longitudinal strain is -20.1 %. The global  longitudinal strain is normal.   2. Right ventricular systolic function is normal. The right ventricular  size is normal.   3. Left atrial size was moderately dilated.   4. Posterior leaftlet thickening and restriction is new compared with  echo 11/2023. There is severe anteriorly directed mitral regurgitation  which is new. MR regurgitant volume 69 mL by PISA. Recommend TEE to better  evaluate. Consider blood cultures and   evaluation for endocarditis. The mitral valve is abnormal. Severe mitral  valve regurgitation. No evidence of mitral stenosis. There is mild  prolapse of the middle scallop of the posterior leaflet of the mitral  valve.   5. The aortic valve is tricuspid. Aortic valve regurgitation is not  visualized. No aortic stenosis is present.  Physical Exam:   VS:  BP (!) 150/80   Pulse 83   Ht 5' 5 (1.651 m)   Wt 253 lb (114.8 kg)   LMP 11/22/2018 (Approximate)   SpO2 95%   BMI 42.10 kg/m    Wt Readings from Last 3 Encounters:  10/25/24 253 lb (114.8 kg)  10/18/24 254 lb 9.6 oz (115.5 kg)  05/20/24 258 lb 12.8 oz (117.4 kg)    GEN: Well nourished, well developed in no acute distress NECK: No JVD; No carotid bruits CARDIAC: RRR, 3/6 HSM  RESPIRATORY:  Clear to auscultation without rales, wheezing or rhonchi  ABDOMEN: Soft, non-tender, non-distended EXTREMITIES:  No edema; No deformity  ASSESSMENT AND PLAN: .   Assessment and Plan    Severe mitral valve regurgitation with flail segment Severe mitral valve regurgitation with flail segment confirmed by echocardiogram. Immediate intervention required to prevent atrial fibrillation or heart failure. - Perform transesophageal echocardiogram on Friday to assess severity and repairability. - Conduct cardiac catheterization on Friday for surgical preparation. - Refer to Dr. Ricky at [facility] for evaluation and  potential mitral valve repair via mini thoracotomy. - Complete pre-procedure lab work: CBC and BMAP. - Hold metformin  for two days post-cardiac catheterization. - Schedule follow-up in six months.            Informed Consent   Shared Decision Making/Informed Consent The risks [stroke (1 in 1000), death (1 in 1000), kidney failure [usually temporary] (1 in 500), bleeding (1 in 200), allergic reaction [possibly serious] (1 in 200)], benefits (diagnostic support and management of coronary artery disease) and alternatives of a cardiac catheterization were discussed in detail with Ms. Wisehart and she is willing to proceed.    The risks [esophageal damage, perforation (1:10,000 risk), bleeding, pharyngeal hematoma as well as other potential complications associated with conscious sedation including aspiration, arrhythmia, respiratory failure and death], benefits (treatment guidance and diagnostic support) and alternatives of a transesophageal echocardiogram were discussed in detail with Ms. Jerome and she is willing to proceed.       Follow-up: Return in about 6 months (around 04/24/2025).   Signed, Darryle DASEN. Barbaraann, MD, [facility]  [facility]  [address] [place], [place] [address] [phone]  9:52 AM

## 2024-10-24 NOTE — Progress Notes (Unsigned)
 Cardiology Office Note:  .   Date:  10/25/2024  ID:  [name], DOB 1966-07-17, MRN [id number] PCP: Domenica Harlene LABOR, MD  [facility] Providers Cardiologist:  Darryle ONEIDA Decent, MD { History of Present Illness: .    Chief Complaint  Patient presents with   Follow-up    JESALYN FINAZZO is a 58 y.o. female with history of MR who presents for follow-up.    History of Present Illness   [name] is a 58 year old female with nonobstructive coronary artery disease who presents for follow-up of severe mitral valve regurgitation.  Approximately one week ago, she experienced acute shortness of breath, and a new heart murmur was detected. An echocardiogram previously performed showed severe mitral valve regurgitation. During an episode at the beach, she was unable to sleep due to shortness of breath, describing it as feeling 'like the ocean.' No chest pain, fever, or chills. No swelling in her legs.  She is currently taking Lasix as needed but reports no significant increase in urination, possibly due to high water intake. She has been active, walking 9,000 steps a day at work, and has noticed increased shortness of breath during swimming, requiring her to come up for air more frequently.  Her mother in law recently recovered from pneumonia and has a history of lung issues, which has made her weak.          Problem List Coronary calcium   -CAC 7.1 (74th percentile) 2. HLD -T chol 163, TG 263, HDL 36, LDL 73 3. Obesity -BMI 41 4. Mitral regurgitation      ROS: All other ROS reviewed and negative. Pertinent positives noted in the HPI.     Studies Reviewed: SABRA       TTE 10/19/2024  1. Left ventricular ejection fraction, by estimation, is 60 to 65%. Left  ventricular ejection fraction by 3D volume is 61 %. The left ventricle has  normal function. The left ventricle has no regional wall motion  abnormalities. Left ventricular diastolic   function could not be evaluated.  Elevated left atrial pressure. The  average left ventricular global longitudinal strain is -20.1 %. The global  longitudinal strain is normal.   2. Right ventricular systolic function is normal. The right ventricular  size is normal.   3. Left atrial size was moderately dilated.   4. Posterior leaftlet thickening and restriction is new compared with  echo 11/2023. There is severe anteriorly directed mitral regurgitation  which is new. MR regurgitant volume 69 mL by PISA. Recommend TEE to better  evaluate. Consider blood cultures and   evaluation for endocarditis. The mitral valve is abnormal. Severe mitral  valve regurgitation. No evidence of mitral stenosis. There is mild  prolapse of the middle scallop of the posterior leaflet of the mitral  valve.   5. The aortic valve is tricuspid. Aortic valve regurgitation is not  visualized. No aortic stenosis is present.  Physical Exam:   VS:  BP (!) 150/80   Pulse 83   Ht 5' 5 (1.651 m)   Wt 253 lb (114.8 kg)   LMP 11/22/2018 (Approximate)   SpO2 95%   BMI 42.10 kg/m    Wt Readings from Last 3 Encounters:  10/25/24 253 lb (114.8 kg)  10/18/24 254 lb 9.6 oz (115.5 kg)  05/20/24 258 lb 12.8 oz (117.4 kg)    GEN: Well nourished, well developed in no acute distress NECK: No JVD; No carotid bruits CARDIAC: RRR, 3/6 HSM  RESPIRATORY:  Clear to auscultation without rales, wheezing or rhonchi  ABDOMEN: Soft, non-tender, non-distended EXTREMITIES:  No edema; No deformity  ASSESSMENT AND PLAN: .   Assessment and Plan    Severe mitral valve regurgitation with flail segment Severe mitral valve regurgitation with flail segment confirmed by echocardiogram. Immediate intervention required to prevent atrial fibrillation or heart failure. - Perform transesophageal echocardiogram on Friday to assess severity and repairability. - Conduct cardiac catheterization on Friday for surgical preparation. - Refer to Dr. Ricky at [facility] for evaluation and  potential mitral valve repair via mini thoracotomy. - Complete pre-procedure lab work: CBC and BMAP. - Hold metformin  for two days post-cardiac catheterization. - Schedule follow-up in six months.            Informed Consent   Shared Decision Making/Informed Consent The risks [stroke (1 in 1000), death (1 in 1000), kidney failure [usually temporary] (1 in 500), bleeding (1 in 200), allergic reaction [possibly serious] (1 in 200)], benefits (diagnostic support and management of coronary artery disease) and alternatives of a cardiac catheterization were discussed in detail with Ms. Wisehart and she is willing to proceed.    The risks [esophageal damage, perforation (1:10,000 risk), bleeding, pharyngeal hematoma as well as other potential complications associated with conscious sedation including aspiration, arrhythmia, respiratory failure and death], benefits (treatment guidance and diagnostic support) and alternatives of a transesophageal echocardiogram were discussed in detail with Ms. Jerome and she is willing to proceed.       Follow-up: Return in about 6 months (around 04/24/2025).   Signed, Darryle DASEN. Barbaraann, MD, [facility]  [facility]  [address] [place], [place] [address] [phone]  9:52 AM

## 2024-10-25 ENCOUNTER — Encounter: Payer: Self-pay | Admitting: Cardiovascular Disease

## 2024-10-25 ENCOUNTER — Ambulatory Visit: Attending: Cardiovascular Disease | Admitting: Cardiovascular Disease

## 2024-10-25 VITALS — BP 150/80 | HR 83 | Ht 65.0 in | Wt 253.0 lb

## 2024-10-25 DIAGNOSIS — I2510 Atherosclerotic heart disease of native coronary artery without angina pectoris: Secondary | ICD-10-CM

## 2024-10-25 DIAGNOSIS — E782 Mixed hyperlipidemia: Secondary | ICD-10-CM | POA: Diagnosis not present

## 2024-10-25 DIAGNOSIS — I340 Nonrheumatic mitral (valve) insufficiency: Secondary | ICD-10-CM

## 2024-10-25 NOTE — Patient Instructions (Signed)
 Testing/Procedures:     You are scheduled for a TEE (Transesophageal Echocardiogram) on Friday, November 7 with Dr. LONI.  Please arrive at the [facility] (Main Entrance A) at [facility]: [address] [place], [place] [address] at 8:00 AM (This time is 1 hour(s) before your procedure to ensure your preparation).   Free valet parking service is available. You will check in at ADMITTING.   *Please Note: You will receive a call the day before your procedure to confirm the appointment time. That time may have changed from the original time based on the schedule for that day.*    DIET:  Nothing to eat or drink after midnight except a sip of water with medications (see medication instructions below)  MEDICATION INSTRUCTIONS:  DO NOT TAKE METFORMIN  OR FUROSEMIDE THE MORNING OF THE PROCEDURE  LABS NONE NEEDED  FYI:  For your safety, and to allow us  to monitor your vital signs accurately during the surgery/procedure we request: If you have artificial nails, gel coating, SNS etc, please have those removed prior to your surgery/procedure. Not having the nail coverings /polish removed may result in cancellation or delay of your surgery/procedure.  Your support person will be asked to wait in the waiting room during your procedure.  It is OK to have someone drop you off and come back when you are ready to be discharged.  You cannot drive after the procedure and will need someone to drive you home.  Bring your insurance cards.  *Special Note: Every effort is made to have your procedure done on time. Occasionally there are emergencies that occur at the hospital that may cause delays. Please be patient if a delay does occur.          Cardiac Catheterization   You are scheduled for a Cardiac Catheterization on Friday, November 7 with Dr. Lurena Red.  1. Please arrive at the [facility] (Main Entrance A) at [facility]: [address] [place], [place] [address] at  8:00 AM (This time is 2 hour(s) before your procedure to ensure your preparation).   Free valet parking service is available. You will check in at ADMITTING. The support person will be asked to wait in the waiting room.  It is OK to have someone drop you off and come back when you are ready to be discharged.        Special note: Every effort is made to have your procedure done on time. Please understand that emergencies sometimes delay scheduled procedures.  2. Diet: Nothing to eat after midnight.  3. Hydration:You need to be well hydrated before your procedure. On November 7, you may drink approved liquids (see below) until 2 hours before the procedure, with 16 oz of water as your last intake.   List of approved liquids water, clear juice, clear tea, black coffee, fruit juices, non-citric and without pulp, carbonated beverages, Gatorade, Kool -Aid, plain Jello-O and plain ice popsicles.  4. Labs: NONE NEEDED  5. Medication instructions in preparation for your procedure:  DO NOT TAKE METFORMIN  FRIDAY, SATURDAY OR SUNDAY-RESTART ON MONDAY 10 TH  DO NOT TAKE FUROSEMIDE THE MORNING OF THE PROCEDURE  On the morning of your procedure, take Aspirin 81 mg and any morning medicines NOT listed above.  You may use sips of water.  6. Plan to go home the same day, you will only stay overnight if medically necessary. 7. You MUST have a responsible adult to drive you home. 8. An adult  MUST be with you the first 24 hours after you arrive home. 9. Bring a current list of your medications, and the last time and date medication taken. 10. Bring ID and current insurance cards. 11.Please wear clothes that are easy to get on and off and wear slip-on shoes.  Thank you for allowing us  to care for you!   -- [facility]    Follow-Up: At [facility], you and your health needs are our priority.  As part of our continuing mission to provide you with exceptional heart  care, our providers are all part of one team.  This team includes your primary Cardiologist (physician) and Advanced Practice Providers or APPs (Physician Assistants and Nurse Practitioners) who all work together to provide you with the care you need, when you need it.  Your next appointment:   6 month(s)  Provider:   Darryle ONEIDA Decent, MD

## 2024-10-28 ENCOUNTER — Telehealth: Payer: Self-pay | Admitting: *Deleted

## 2024-10-28 NOTE — Telephone Encounter (Addendum)
 Cardiac Catheterization scheduled at [facility] for: Friday October 29, 2024 10:30 AM/TEE 8:45 AM Arrival time [facility] Main Entrance A at: 7:45 AM  Diet: Nothing to eat or drink after midnight.  Medication instructions: -Hold:   Metformin -day of procedure and 48 hours post procedure  Lasix-AM of procedure -Other usual morning medications can be taken with sips of water including aspirin 81 mg.  Plan to go home the same day, you will only stay overnight if medically necessary.  You must have responsible adult to drive you home.  Someone must be with you the first 24 hours after you arrive home.  Reviewed procedure instructions with patient.

## 2024-10-28 NOTE — Progress Notes (Signed)
 Spoke to patient and instructed them to come at 0745  and to be NPO after 0000.     Confirmed that patient will have a ride home and someone to stay with them for 24 hours after the procedure.   Medications reviewed.

## 2024-10-29 ENCOUNTER — Encounter (HOSPITAL_COMMUNITY)
Admission: RE | Disposition: A | Discharge status: Home / Self Care | Payer: Self-pay | Source: Home / Self Care | Attending: Internal Medicine

## 2024-10-29 ENCOUNTER — Ambulatory Visit (HOSPITAL_COMMUNITY): Admitting: Anesthesiology

## 2024-10-29 ENCOUNTER — Other Ambulatory Visit (HOSPITAL_BASED_OUTPATIENT_CLINIC_OR_DEPARTMENT_OTHER): Payer: Self-pay

## 2024-10-29 ENCOUNTER — Encounter (HOSPITAL_COMMUNITY): Admission: RE | Disposition: A | Payer: Self-pay | Source: Home / Self Care | Attending: Internal Medicine

## 2024-10-29 ENCOUNTER — Ambulatory Visit (HOSPITAL_COMMUNITY)

## 2024-10-29 ENCOUNTER — Ambulatory Visit (HOSPITAL_COMMUNITY)
Admission: RE | Admit: 2024-10-29 | Discharge: 2024-10-29 | Disposition: A | Discharge status: Home / Self Care | Attending: Internal Medicine | Admitting: Internal Medicine

## 2024-10-29 ENCOUNTER — Other Ambulatory Visit: Payer: Self-pay

## 2024-10-29 DIAGNOSIS — G4730 Sleep apnea, unspecified: Secondary | ICD-10-CM | POA: Diagnosis not present

## 2024-10-29 DIAGNOSIS — I340 Nonrheumatic mitral (valve) insufficiency: Secondary | ICD-10-CM

## 2024-10-29 DIAGNOSIS — E119 Type 2 diabetes mellitus without complications: Secondary | ICD-10-CM | POA: Diagnosis not present

## 2024-10-29 DIAGNOSIS — E7849 Other hyperlipidemia: Secondary | ICD-10-CM

## 2024-10-29 DIAGNOSIS — E66813 Obesity, class 3: Secondary | ICD-10-CM | POA: Diagnosis not present

## 2024-10-29 DIAGNOSIS — Z79899 Other long term (current) drug therapy: Secondary | ICD-10-CM | POA: Diagnosis not present

## 2024-10-29 DIAGNOSIS — I10 Essential (primary) hypertension: Secondary | ICD-10-CM | POA: Insufficient documentation

## 2024-10-29 DIAGNOSIS — Z6841 Body Mass Index (BMI) 40.0 and over, adult: Secondary | ICD-10-CM | POA: Insufficient documentation

## 2024-10-29 DIAGNOSIS — K219 Gastro-esophageal reflux disease without esophagitis: Secondary | ICD-10-CM | POA: Diagnosis not present

## 2024-10-29 DIAGNOSIS — Z7984 Long term (current) use of oral hypoglycemic drugs: Secondary | ICD-10-CM | POA: Insufficient documentation

## 2024-10-29 DIAGNOSIS — I2510 Atherosclerotic heart disease of native coronary artery without angina pectoris: Secondary | ICD-10-CM | POA: Insufficient documentation

## 2024-10-29 DIAGNOSIS — E785 Hyperlipidemia, unspecified: Secondary | ICD-10-CM | POA: Insufficient documentation

## 2024-10-29 HISTORY — PX: TRANSESOPHAGEAL ECHOCARDIOGRAM (CATH LAB): EP1270

## 2024-10-29 HISTORY — PX: RIGHT/LEFT HEART CATH AND CORONARY ANGIOGRAPHY: CATH118266

## 2024-10-29 LAB — POCT I-STAT EG7
Acid-base deficit: 11 mmol/L — ABNORMAL HIGH (ref 0.0–2.0)
Acid-base deficit: 2 mmol/L (ref 0.0–2.0)
Bicarbonate: 16 mmol/L — ABNORMAL LOW (ref 20.0–28.0)
Bicarbonate: 23.9 mmol/L (ref 20.0–28.0)
Calcium, Ion: 0.69 mmol/L — CL (ref 1.15–1.40)
Calcium, Ion: 1.26 mmol/L (ref 1.15–1.40)
HCT: 34 % — ABNORMAL LOW (ref 36.0–46.0)
HCT: 36 % (ref 36.0–46.0)
Hemoglobin: 11.6 g/dL — ABNORMAL LOW (ref 12.0–15.0)
Hemoglobin: 12.2 g/dL (ref 12.0–15.0)
O2 Saturation: 55 %
O2 Saturation: 64 %
Potassium: 2.3 mmol/L — CL (ref 3.5–5.1)
Potassium: 3.5 mmol/L (ref 3.5–5.1)
Sodium: 128 mmol/L — ABNORMAL LOW (ref 135–145)
Sodium: 135 mmol/L (ref 135–145)
TCO2: 17 mmol/L — ABNORMAL LOW (ref 22–32)
TCO2: 25 mmol/L (ref 22–32)
pCO2, Ven: 39.6 mmHg — ABNORMAL LOW (ref 44–60)
pCO2, Ven: 46.8 mmHg (ref 44–60)
pH, Ven: 7.214 — ABNORMAL LOW (ref 7.25–7.43)
pH, Ven: 7.316 (ref 7.25–7.43)
pO2, Ven: 35 mmHg (ref 32–45)
pO2, Ven: 36 mmHg (ref 32–45)

## 2024-10-29 LAB — ECHO TEE

## 2024-10-29 LAB — POCT I-STAT 7, (LYTES, BLD GAS, ICA,H+H)
Acid-base deficit: 2 mmol/L (ref 0.0–2.0)
Bicarbonate: 23.7 mmol/L (ref 20.0–28.0)
Calcium, Ion: 1.18 mmol/L (ref 1.15–1.40)
HCT: 36 % (ref 36.0–46.0)
Hemoglobin: 12.2 g/dL (ref 12.0–15.0)
O2 Saturation: 87 %
Potassium: 3.7 mmol/L (ref 3.5–5.1)
Sodium: 143 mmol/L (ref 135–145)
TCO2: 25 mmol/L (ref 22–32)
pCO2 arterial: 41.6 mmHg (ref 32–48)
pH, Arterial: 7.364 (ref 7.35–7.45)
pO2, Arterial: 55 mmHg — ABNORMAL LOW (ref 83–108)

## 2024-10-29 LAB — GLUCOSE, CAPILLARY: Glucose-Capillary: 113 mg/dL — ABNORMAL HIGH (ref 70–99)

## 2024-10-29 SURGERY — RIGHT/LEFT HEART CATH AND CORONARY ANGIOGRAPHY
Anesthesia: LOCAL

## 2024-10-29 SURGERY — TRANSESOPHAGEAL ECHOCARDIOGRAM (TEE) (CATHLAB)
Anesthesia: Monitor Anesthesia Care

## 2024-10-29 MED ORDER — FENTANYL CITRATE (PF) 100 MCG/2ML IJ SOLN
INTRAMUSCULAR | Status: AC
  Filled 2024-10-29: qty 2

## 2024-10-29 MED ORDER — MIDAZOLAM HCL (PF) 2 MG/2ML IJ SOLN
INTRAMUSCULAR | Status: DC | PRN
  Administered 2024-10-29: 1 mg via INTRAVENOUS

## 2024-10-29 MED ORDER — HYDRALAZINE HCL 20 MG/ML IJ SOLN: 10.0000 mg | INTRAMUSCULAR | Status: DC | PRN

## 2024-10-29 MED ORDER — SODIUM CHLORIDE 0.9% FLUSH: 3.0000 mL | Freq: Two times a day (BID) | INTRAVENOUS | Status: DC

## 2024-10-29 MED ORDER — FENTANYL CITRATE (PF) 100 MCG/2ML IJ SOLN
INTRAMUSCULAR | Status: DC | PRN
  Administered 2024-10-29: 50 ug via INTRAVENOUS
  Administered 2024-10-29 (×2): 25 ug via INTRAVENOUS

## 2024-10-29 MED ORDER — HEPARIN (PORCINE) IN NACL 2-0.9 UNITS/ML: INTRAMUSCULAR | Status: DC | PRN

## 2024-10-29 MED ORDER — MIDAZOLAM HCL 2 MG/2ML IJ SOLN
INTRAMUSCULAR | Status: AC
  Filled 2024-10-29: qty 2

## 2024-10-29 MED ORDER — FUROSEMIDE 10 MG/ML IJ SOLN
INTRAMUSCULAR | Status: AC
  Filled 2024-10-29: qty 4

## 2024-10-29 MED ORDER — LIDOCAINE HCL (PF) 1 % IJ SOLN
INTRAMUSCULAR | Status: DC | PRN
Start: 2024-10-29 — End: 2024-10-29
  Administered 2024-10-29: 2 mL via INTRADERMAL

## 2024-10-29 MED ORDER — ACETAMINOPHEN 325 MG PO TABS: 650.0000 mg | ORAL_TABLET | ORAL | Status: DC | PRN

## 2024-10-29 MED ORDER — LIDOCAINE HCL (PF) 1 % IJ SOLN
INTRAMUSCULAR | Status: AC
Start: 2024-10-29 — End: 2024-10-29
  Filled 2024-10-29: qty 30

## 2024-10-29 MED ORDER — HEPARIN SODIUM (PORCINE) 1000 UNIT/ML IJ SOLN
INTRAMUSCULAR | Status: AC
  Filled 2024-10-29: qty 10

## 2024-10-29 MED ORDER — SODIUM CHLORIDE 0.9 % IV SOLN: INTRAVENOUS | Status: DC

## 2024-10-29 MED ORDER — FUROSEMIDE 20 MG PO TABS
20.0000 mg | ORAL_TABLET | Freq: Every day | ORAL | 1 refills | Status: DC
  Filled 2024-10-29 – 2024-11-16 (×5): qty 30, 30d supply, fill #0

## 2024-10-29 MED ORDER — SODIUM CHLORIDE 0.9 % IV SOLN
250.0000 mL | INTRAVENOUS | Status: DC | PRN
Start: 2024-10-29 — End: 2024-10-29

## 2024-10-29 MED ORDER — IOHEXOL 350 MG/ML SOLN
INTRAVENOUS | Status: DC | PRN
  Administered 2024-10-29: 30 mL

## 2024-10-29 MED ORDER — HEPARIN SODIUM (PORCINE) 1000 UNIT/ML IJ SOLN
INTRAMUSCULAR | Status: DC | PRN
Start: 2024-10-29 — End: 2024-10-29
  Administered 2024-10-29: 5000 [IU] via INTRA_ARTERIAL

## 2024-10-29 MED ORDER — HEPARIN (PORCINE) IN NACL 1000-0.9 UT/500ML-% IV SOLN
INTRAVENOUS | Status: DC | PRN
  Administered 2024-10-29 (×2): 500 mL

## 2024-10-29 MED ORDER — ONDANSETRON HCL 4 MG/2ML IJ SOLN
4.0000 mg | Freq: Four times a day (QID) | INTRAMUSCULAR | Status: DC | PRN
Start: 2024-10-29 — End: 2024-10-29

## 2024-10-29 MED ORDER — SODIUM CHLORIDE 0.9% FLUSH
3.0000 mL | INTRAVENOUS | Status: DC | PRN
Start: 2024-10-29 — End: 2024-10-29

## 2024-10-29 MED ORDER — PROPOFOL 10 MG/ML IV BOLUS
INTRAVENOUS | Status: DC | PRN
  Administered 2024-10-29: 20 mg via INTRAVENOUS
  Administered 2024-10-29: 120 ug/kg/min via INTRAVENOUS
  Administered 2024-10-29 (×2): 20 mg via INTRAVENOUS

## 2024-10-29 MED ORDER — VERAPAMIL HCL 2.5 MG/ML IV SOLN
INTRAVENOUS | Status: DC | PRN
  Administered 2024-10-29: 10 mL via INTRA_ARTERIAL

## 2024-10-29 MED ORDER — PHENYLEPHRINE 80 MCG/ML (10ML) SYRINGE FOR IV PUSH (FOR BLOOD PRESSURE SUPPORT)
PREFILLED_SYRINGE | INTRAVENOUS | Status: DC | PRN
  Administered 2024-10-29 (×2): 120 ug via INTRAVENOUS

## 2024-10-29 MED ORDER — VERAPAMIL HCL 2.5 MG/ML IV SOLN
INTRAVENOUS | Status: AC
  Filled 2024-10-29: qty 2

## 2024-10-29 MED ORDER — FENTANYL CITRATE (PF) 100 MCG/2ML IJ SOLN
INTRAMUSCULAR | Status: DC | PRN
  Administered 2024-10-29: 25 ug via INTRAVENOUS

## 2024-10-29 MED ORDER — LABETALOL HCL 5 MG/ML IV SOLN: 10.0000 mg | INTRAVENOUS | Status: DC | PRN

## 2024-10-29 SURGICAL SUPPLY — 10 items
CATH BALLN WEDGE 5F 110CM (CATHETERS) IMPLANT
CATH INFINITI 5FR ANG PIGTAIL (CATHETERS) IMPLANT
CATH INFINITI AMBI 6FR TG (CATHETERS) IMPLANT
DEVICE RAD COMP TR BAND LRG (VASCULAR PRODUCTS) IMPLANT
GLIDESHEATH SLEND SS 6F .021 (SHEATH) IMPLANT
GUIDEWIRE .025 260CM (WIRE) IMPLANT
PACK CARDIAC CATHETERIZATION (CUSTOM PROCEDURE TRAY) ×2 IMPLANT
SET ATX-X65L (MISCELLANEOUS) IMPLANT
SHEATH GLIDE SLENDER 4/5FR (SHEATH) IMPLANT
WIRE EMERALD 3MM-J .035X260CM (WIRE) IMPLANT

## 2024-10-29 NOTE — Transfer of Care (Signed)
 Immediate Anesthesia Transfer of Care Note  Patient: Hailey Noble  Procedure(s) Performed: TRANSESOPHAGEAL ECHOCARDIOGRAM  Patient Location: PACU and Cath Lab  Anesthesia Type:MAC  Level of Consciousness: drowsy  Airway & Oxygen Therapy: Patient Spontanous Breathing  Post-op Assessment: Report given to RN  Post vital signs: stable  Last Vitals:  Vitals Value Taken Time  BP 107/45 10/29/24 10:47  Temp 36.7 C 10/29/24 10:47  Pulse 85 10/29/24 10:50  Resp 22 10/29/24 10:50  SpO2 93 % 10/29/24 10:50  Vitals shown include unfiled device data.  Last Pain:  Vitals:   10/29/24 1047  TempSrc: Tympanic  PainSc: Asleep         Complications: No notable events documented.

## 2024-10-29 NOTE — Discharge Instructions (Addendum)
 Restart metformin  on 11/9.  Drink plenty of fluid for 48 hours and keep wrist elevated at heart level for 24 hours  Radial Site Care   This sheet gives you information about how to care for yourself after your procedure. Your health care provider may also give you more specific instructions. If you have problems or questions, contact your health care provider. What can I expect after the procedure? After the procedure, it is common to have: Bruising and tenderness at the catheter insertion area. Follow these instructions at home: Medicines Take over-the-counter and prescription medicines only as told by your health care provider. Insertion site care Follow instructions from your health care provider about how to take care of your insertion site. Make sure you: Wash your hands with soap and water before you change your bandage (dressing). If soap and water are not available, use hand sanitizer. remove your dressing as told by your health care provider. In 24 hours Check your insertion site every day for signs of infection. Check for: Redness, swelling, or pain. Fluid or blood. Pus or a bad smell. Warmth. Do not take baths, swim, or use a hot tub until your health care provider approves. You may shower 24-48 hours after the procedure, or as directed by your health care provider. Remove the dressing and gently wash the site with plain soap and water. Pat the area dry with a clean towel. Do not rub the site. That could cause bleeding. Do not apply powder or lotion to the site. Activity   For 24 hours after the procedure, or as directed by your health care provider: Do not flex or bend the affected arm. Do not push or pull heavy objects with the affected arm. Do not drive yourself home from the hospital or clinic. You may drive 24 hours after the procedure unless your health care provider tells you not to. Do not operate machinery or power tools. Do not lift anything that is heavier  than 10 lb (4.5 kg), or the limit that you are told, until your health care provider says that it is safe. For 4 days Ask your health care provider when it is okay to: Return to work or school. Resume usual physical activities or sports. Resume sexual activity. General instructions If the catheter site starts to bleed, raise your arm and put firm pressure on the site. If the bleeding does not stop, get help right away. This is a medical emergency. If you went home on the same day as your procedure, a responsible adult should be with you for the first 24 hours after you arrive home. Keep all follow-up visits as told by your health care provider. This is important. Contact a health care provider if: You have a fever. You have redness, swelling, or yellow drainage around your insertion site. Get help right away if: You have unusual pain at the radial site. The catheter insertion area swells very fast. The insertion area is bleeding, and the bleeding does not stop when you hold steady pressure on the area. Your arm or hand becomes pale, cool, tingly, or numb. These symptoms may represent a serious problem that is an emergency. Do not wait to see if the symptoms will go away. Get medical help right away. Call your local emergency services (911 in the [place] Do not drive yourself to the hospital. Summary After the procedure, it is common to have bruising and tenderness at the site. Follow instructions from your health care provider about how to  take care of your radial site wound. Check the wound every day for signs of infection. Do not lift anything that is heavier than 10 lb (4.5 kg), or the limit that you are told, until your health care provider says that it is safe. This information is not intended to replace advice given to you by your health care provider. Make sure you discuss any questions you have with your health care provider. Document Revised: 01/14/2018 Document Reviewed:  01/14/2018 Elsevier Patient Education  2020 [employer].

## 2024-10-29 NOTE — Interval H&P Note (Signed)
 History and Physical Interval Note:  10/29/2024 8:50 AM  Hailey Noble  has presented today for surgery, with the diagnosis of SEVERE MITRAL VALVE REGURGITATION.  The various methods of treatment have been discussed with the patient and family. After consideration of risks, benefits and other options for treatment, the patient has consented to  Procedure(s): TRANSESOPHAGEAL ECHOCARDIOGRAM (N/A) as a surgical intervention.  The patient's history has been reviewed, patient examined, no change in status, stable for surgery.  I have reviewed the patient's chart and labs.  Questions were answered to the patient's satisfaction.     Hailey Noble

## 2024-10-29 NOTE — Interval H&P Note (Signed)
 History and Physical Interval Note:  10/29/2024 8:22 AM  Hailey Noble  has presented today for surgery, with the diagnosis of mitral reg.  The various methods of treatment have been discussed with the patient and family. After consideration of risks, benefits and other options for treatment, the patient has consented to  Procedure(s): RIGHT/LEFT HEART CATH AND CORONARY ANGIOGRAPHY (N/A) as a surgical intervention.  The patient's history has been reviewed, patient examined, no change in status, stable for surgery.  I have reviewed the patient's chart and labs.  Questions were answered to the patient's satisfaction.     Hailey Noble

## 2024-10-29 NOTE — Progress Notes (Signed)
 Echocardiogram Echocardiogram Transesophageal has been performed.  Hailey Noble 10/29/2024, 11:57 AM

## 2024-10-29 NOTE — Anesthesia Preprocedure Evaluation (Signed)
 Anesthesia Evaluation  Patient identified by MRN, date of birth, ID band Patient awake    Reviewed: Allergy & Precautions, NPO status , Patient's Chart, lab work & pertinent test results  History of Anesthesia Complications (+) PONV and history of anesthetic complications  Airway Mallampati: II  TM Distance: >3 FB Neck ROM: Full    Dental  (+) Teeth Intact, Dental Advisory Given   Pulmonary sleep apnea    Pulmonary exam normal breath sounds clear to auscultation       Cardiovascular hypertension, Pt. on medications + Valvular Problems/Murmurs MR  Rhythm:Regular Rate:Normal + Systolic murmurs    Neuro/Psych  PSYCHIATRIC DISORDERS Anxiety Depression    negative neurological ROS     GI/Hepatic Neg liver ROS,GERD  Medicated,,  Endo/Other  diabetes, Type 2, Oral Hypoglycemic Agents  Class 3 obesity  Renal/GU negative Renal ROS     Musculoskeletal  (+) Arthritis ,    Abdominal   Peds  Hematology negative hematology ROS (+)   Anesthesia Other Findings Day of surgery medications reviewed with the patient.  Reproductive/Obstetrics                              Anesthesia Physical Anesthesia Plan  ASA: 4  Anesthesia Plan: MAC   Post-op Pain Management:    Induction: Intravenous  PONV Risk Score and Plan: 3 and Propofol  infusion and TIVA  Airway Management Planned: Natural Airway and Simple Face Mask  Additional Equipment:   Intra-op Plan:   Post-operative Plan:   Informed Consent: I have reviewed the patients History and Physical, chart, labs and discussed the procedure including the risks, benefits and alternatives for the proposed anesthesia with the patient or authorized representative who has indicated his/her understanding and acceptance.     Dental advisory given  Plan Discussed with: CRNA and Anesthesiologist  Anesthesia Plan Comments:         Anesthesia Quick  Evaluation

## 2024-10-29 NOTE — CV Procedure (Signed)
 INDICATIONS: Flail posterior leaflet, MR  PROCEDURE:   Informed consent was obtained prior to the procedure. The risks, benefits and alternatives for the procedure were discussed and the patient comprehended these risks.  Risks include, but are not limited to, cough, sore throat, vomiting, nausea, somnolence, esophageal and stomach trauma or perforation, bleeding, low blood pressure, aspiration, pneumonia, infection, trauma to the teeth and death.    Procedural time out performed.  During this procedure the patient was administered propofol  per anesthesia.  The patient's heart rate, blood pressure, and oxygen saturation were monitored continuously during the procedure. The period of conscious sedation was 30 minutes, of which I was present face-to-face 100% of this time.  The transesophageal probe was inserted in the esophagus and stomach without difficulty and multiple views were obtained.  The patient was kept under observation until the patient left the procedure room.  The patient left the procedure room in stable condition.   Agitated microbubble saline contrast was administered.  COMPLICATIONS:    There were no immediate complications.  FINDINGS:   FORMAL ECHOCARDIOGRAM REPORT PENDING Normal EF Flail P2 of mitral valve. Anteriorly directed MR, mod-severe MR with hypotension but appears severe with normotension. Systolic blunting of Pveins.  Trivial TR, PR, no AI No definite PFO with agitated saline.   RECOMMENDATIONS:     Proceed to cath.   Time Spent Directly with the Patient:  45 minutes   Shamar Kracke A Latravia Southgate 10/29/2024, 10:56 AM

## 2024-10-31 ENCOUNTER — Encounter (HOSPITAL_COMMUNITY): Payer: Self-pay | Admitting: Internal Medicine

## 2024-11-01 ENCOUNTER — Other Ambulatory Visit (HOSPITAL_BASED_OUTPATIENT_CLINIC_OR_DEPARTMENT_OTHER): Payer: Self-pay

## 2024-11-01 ENCOUNTER — Encounter: Payer: Self-pay | Admitting: Cardiovascular Disease

## 2024-11-01 MED FILL — Furosemide Inj 10 MG/ML: INTRAMUSCULAR | Qty: 4 | Status: AC

## 2024-11-01 NOTE — Anesthesia Postprocedure Evaluation (Signed)
 Anesthesia Post Note  Patient: Hailey Noble  Procedure(s) Performed: TRANSESOPHAGEAL ECHOCARDIOGRAM     Patient location during evaluation: Cath Lab Anesthesia Type: MAC Level of consciousness: awake and alert Pain management: pain level controlled Vital Signs Assessment: post-procedure vital signs reviewed and stable Respiratory status: spontaneous breathing, nonlabored ventilation and respiratory function stable Cardiovascular status: stable and blood pressure returned to baseline Postop Assessment: no apparent nausea or vomiting Anesthetic complications: no   No notable events documented.  Last Vitals:  Vitals:   10/29/24 1525 10/29/24 1540  BP: (!) 144/72 139/70  Pulse: 81 79  Resp:    Temp:    SpO2: 94% 92%    Last Pain:  Vitals:   10/29/24 1429  TempSrc:   PainSc: 0-No pain                 Garnette FORBES Skillern

## 2024-11-02 ENCOUNTER — Other Ambulatory Visit (HOSPITAL_BASED_OUTPATIENT_CLINIC_OR_DEPARTMENT_OTHER): Payer: Self-pay

## 2024-11-02 ENCOUNTER — Ambulatory Visit: Admitting: Nurse Practitioner

## 2024-11-02 ENCOUNTER — Other Ambulatory Visit: Payer: Self-pay

## 2024-11-02 VITALS — BP 128/76 | HR 90 | Temp 98.3°F | Ht 65.0 in | Wt 247.4 lb

## 2024-11-02 DIAGNOSIS — U071 COVID-19: Secondary | ICD-10-CM

## 2024-11-02 DIAGNOSIS — R051 Acute cough: Secondary | ICD-10-CM | POA: Diagnosis not present

## 2024-11-02 LAB — POCT INFLUENZA A/B
Influenza A, POC: NEGATIVE
Influenza B, POC: NEGATIVE

## 2024-11-02 LAB — POC COVID19 BINAXNOW: SARS Coronavirus 2 Ag: POSITIVE — AB

## 2024-11-02 MED ORDER — NIRMATRELVIR/RITONAVIR (PAXLOVID)TABLET
3.0000 | ORAL_TABLET | Freq: Two times a day (BID) | ORAL | 0 refills | Status: AC
  Filled 2024-11-02: qty 30, 5d supply, fill #0

## 2024-11-02 MED ORDER — PROMETHAZINE-DM 6.25-15 MG/5ML PO SYRP
5.0000 mL | ORAL_SOLUTION | Freq: Four times a day (QID) | ORAL | 0 refills | Status: DC | PRN
  Filled 2024-11-02: qty 118, 6d supply, fill #0

## 2024-11-02 NOTE — Patient Instructions (Addendum)
 It was great to see you!  Start paxlovid twice a day for 5 days - stop atorvastatin  while taking paxlovid   You can still take robitussin and mucinex   Drink a little more water than your normal   Get plenty of rest  Start cough syrup every 4 hours as needed, this may make you sleepy   Let's follow-up if symptoms worsen or any concerns   Take care,  Tinnie Harada, NP

## 2024-11-02 NOTE — Assessment & Plan Note (Signed)
 Confirmed COVID-19 with symptoms of headache, low-grade fever, productive cough, and ear fullness. No shortness of breath or significant pleural effusion. Start Paxlovid for 5 days. Stop atorvastatin  during Paxlovid treatment and restart the day after completion. Increase fluid intake slightly and monitor for fluid overload signs. Continue mucinex and start promethazine  dm every 4 hours as needed for cough. Work note given.

## 2024-11-02 NOTE — Progress Notes (Signed)
 Acute Office Visit  Subjective:     Patient ID: CHAMPAYNE KOCIAN, female    DOB: Oct 30, 1966, 58 y.o.   MRN: [id number]  Chief Complaint  Patient presents with   Cough    productive cough, sneezing, body aches    HPI Discussed the use of AI scribe software for clinical note transcription with the patient, who gave verbal consent to proceed.  History of Present Illness   BUFORD GAYLER is a 58 year old female who presents with symptoms of COVID-19.  She underwent a right and left heart catheterization on Friday. By Sunday, she developed a headache relieved by Tylenol . On Monday, she experienced body aches, chills, and a low-grade fever of 99.38F. She reports fullness in her right ear, dental pain, and a productive cough with gray-green sputum.  She has mitral valve issues and may require repair or replacement. She is on a two-liter fluid restriction and takes Lasix and Coreg. She denies shortness of breath or leg swelling.  She uses guaifenesin (Mucinex) and takes atorvastatin . She also uses Robitussin and Mucinex for cough and congestion.     ROS See pertinent positives and negatives per HPI.     Objective:    BP 128/76 (BP Location: Left Arm, Patient Position: Sitting, Cuff Size: Normal)   Pulse 90   Temp 98.3 F (36.8 C) (Oral)   Ht 5' 5 (1.651 m)   Wt 247 lb 6.4 oz (112.2 kg)   LMP 11/22/2018 (Approximate)   SpO2 96%   BMI 41.17 kg/m     Physical Exam Vitals and nursing note reviewed.  Constitutional:      General: She is not in acute distress.    Appearance: Normal appearance.  HENT:     Head: Normocephalic.     Right Ear: Tympanic membrane, ear canal and external ear normal.     Left Ear: Tympanic membrane, ear canal and external ear normal.     Nose:     Right Sinus: Maxillary sinus tenderness present. No frontal sinus tenderness.     Left Sinus: Maxillary sinus tenderness present. No frontal sinus tenderness.     Mouth/Throat:     Mouth: Mucous membranes  are moist.     Pharynx: Posterior oropharyngeal erythema present. No oropharyngeal exudate.  Eyes:     Conjunctiva/sclera: Conjunctivae normal.  Cardiovascular:     Rate and Rhythm: Normal rate and regular rhythm.     Pulses: Normal pulses.     Heart sounds: Murmur heard.  Pulmonary:     Effort: Pulmonary effort is normal.     Breath sounds: Normal breath sounds.  Musculoskeletal:     Cervical back: Normal range of motion and neck supple. Tenderness present.  Lymphadenopathy:     Cervical: No cervical adenopathy.  Skin:    General: Skin is warm.  Neurological:     General: No focal deficit present.     Mental Status: She is alert and oriented to person, place, and time.  Psychiatric:        Mood and Affect: Mood normal.        Behavior: Behavior normal.        Thought Content: Thought content normal.        Judgment: Judgment normal.     Results for orders placed or performed in visit on 11/02/24  POCT Influenza A/B  Result Value Ref Range   Influenza A, POC Negative Negative   Influenza B, POC Negative Negative  POC COVID-19  Result  Value Ref Range   SARS Coronavirus 2 Ag Positive (A) Negative        Assessment & Plan:   Problem List Items Addressed This Visit       Other   COVID-19 - Primary   Confirmed COVID-19 with symptoms of headache, low-grade fever, productive cough, and ear fullness. No shortness of breath or significant pleural effusion. Start Paxlovid for 5 days. Stop atorvastatin  during Paxlovid treatment and restart the day after completion. Increase fluid intake slightly and monitor for fluid overload signs. Continue mucinex and start promethazine  dm every 4 hours as needed for cough. Work note given.       Relevant Medications   nirmatrelvir/ritonavir (PAXLOVID) 20 x 150 MG & 10 x 100MG  TABS   Other Visit Diagnoses       Acute cough       Covid-19 positive, flu negative. See plan above.   Relevant Orders   POCT Influenza A/B (Completed)   POC  COVID-19 (Completed)      Meds ordered this encounter  Medications   nirmatrelvir/ritonavir (PAXLOVID) 20 x 150 MG & 10 x 100MG  TABS    Sig: Take 3 tablets by mouth 2 (two) times daily for 5 days. (Take nirmatrelvir 150 mg two tablets twice daily for 5 days and ritonavir 100 mg one tablet twice daily for 5 days) Patient GFR is >60    Dispense:  30 tablet    Refill:  0   promethazine -dextromethorphan (PROMETHAZINE -DM) 6.25-15 MG/5ML syrup    Sig: Take 5 mLs by mouth 4 (four) times daily as needed.    Dispense:  118 mL    Refill:  0    Return if symptoms worsen or fail to improve.  Tinnie DELENA Harada, NP

## 2024-11-03 ENCOUNTER — Other Ambulatory Visit (HOSPITAL_BASED_OUTPATIENT_CLINIC_OR_DEPARTMENT_OTHER): Payer: Self-pay

## 2024-11-08 ENCOUNTER — Other Ambulatory Visit (HOSPITAL_BASED_OUTPATIENT_CLINIC_OR_DEPARTMENT_OTHER): Payer: Self-pay

## 2024-11-08 DIAGNOSIS — I340 Nonrheumatic mitral (valve) insufficiency: Secondary | ICD-10-CM | POA: Diagnosis not present

## 2024-11-09 ENCOUNTER — Other Ambulatory Visit: Payer: Self-pay | Admitting: Family Medicine

## 2024-11-09 ENCOUNTER — Other Ambulatory Visit (HOSPITAL_BASED_OUTPATIENT_CLINIC_OR_DEPARTMENT_OTHER): Payer: Self-pay

## 2024-11-09 MED ORDER — BUPROPION HCL 75 MG PO TABS
75.0000 mg | ORAL_TABLET | Freq: Every day | ORAL | 1 refills | Status: AC
  Filled 2024-11-09: qty 90, 90d supply, fill #0

## 2024-11-10 ENCOUNTER — Telehealth: Payer: Self-pay | Admitting: Adult Health

## 2024-11-10 NOTE — Telephone Encounter (Signed)
 LVM and sent mychart msg informing pt of need to reschedule 11/30/24 VV - NP out   If patient calls back you can offer a slot with Megan for 12/01/24 in the afternoon

## 2024-11-12 ENCOUNTER — Ambulatory Visit: Admitting: Gastroenterology

## 2024-11-12 ENCOUNTER — Other Ambulatory Visit (HOSPITAL_BASED_OUTPATIENT_CLINIC_OR_DEPARTMENT_OTHER): Payer: Self-pay

## 2024-11-12 ENCOUNTER — Encounter: Payer: Self-pay | Admitting: Gastroenterology

## 2024-11-12 VITALS — BP 130/78 | HR 84 | Ht 65.0 in | Wt 248.0 lb

## 2024-11-12 DIAGNOSIS — K581 Irritable bowel syndrome with constipation: Secondary | ICD-10-CM

## 2024-11-12 DIAGNOSIS — K743 Primary biliary cirrhosis: Secondary | ICD-10-CM

## 2024-11-12 DIAGNOSIS — K219 Gastro-esophageal reflux disease without esophagitis: Secondary | ICD-10-CM

## 2024-11-12 MED ORDER — URSODIOL 500 MG PO TABS
500.0000 mg | ORAL_TABLET | Freq: Three times a day (TID) | ORAL | 4 refills | Status: AC
  Filled 2024-11-12 – 2024-12-28 (×2): qty 270, 90d supply, fill #0

## 2024-11-12 MED ORDER — PANTOPRAZOLE SODIUM 40 MG PO TBEC
40.0000 mg | DELAYED_RELEASE_TABLET | Freq: Every day | ORAL | 4 refills | Status: AC | PRN
  Filled 2024-11-12: qty 90, 90d supply, fill #0

## 2024-11-12 NOTE — Patient Instructions (Addendum)
 _______________________________________________________  If your blood pressure at your visit was 140/90 or greater, please contact your primary care physician to follow up on this.  _______________________________________________________  If you are age 58 or older, your body mass index should be between 23-30. Your Body mass index is 41.27 kg/m. If this is out of the aforementioned range listed, please consider follow up with your Primary Care Provider.  If you are age 34 or younger, your body mass index should be between 19-25. Your Body mass index is 41.27 kg/m. If this is out of the aformentioned range listed, please consider follow up with your Primary Care Provider.   ________________________________________________________  The [facility] providers would like to encourage you to use MYCHART to communicate with providers for non-urgent requests or questions.  Due to long hold times on the telephone, sending your provider a message by [facility] may be a faster and more efficient way to get a response.  Please allow 48 business hours for a response.  Please remember that this is for non-urgent requests.  _______________________________________________________  [facility] is using a team-based approach to care.  Your team is made up of your doctor and two to three APPS. Our APPS (Nurse Practitioners and Physician Assistants) work with your physician to ensure care continuity for you. They are fully qualified to address your health concerns and develop a treatment plan. They communicate directly with your gastroenterologist to care for you. Seeing the Advanced Practice Practitioners on your physician's team can help you by facilitating care more promptly, often allowing for earlier appointments, access to diagnostic testing, procedures, and other specialty referrals.   Continue Ursodiol  and Protonix   Please get in touch with your PCP regarding your bone density scan  Please  follow up in 12 months. Give us  a call at [phone] to schedule an appointment.  Repeat colonoscopy for 05-2026. Please call 2 months prior to schedule this. A letter will be sent as it gets closer.  Thank you,  Dr. Lynnie Bring

## 2024-11-12 NOTE — Progress Notes (Signed)
 Chief Complaint: FU  Referring Provider:  Domenica Harlene LABOR, MD      ASSESSMENT AND PLAN;   #1. GERD. Neg EGD 05/2021  #2. IBS-C. Neg colon 05/2021  #3. PBC (elevated Alk phos, +AMA, Liver Bx 05/2021 c/w PBC, No cirrhosis on Bx. US - 04/2024 with mildly lobular liver. Nl plts/alb)  #4. Severe mitral regugitation, awaiting Sx at [facility] 12/09/2024  Plan: -Continue URSO  500mg  po TID (3/day)  -Wt loss  -Continue Protonix  40mg  po every day PRN. -She will also get in touch with Dr. Domenica regarding bone density scan in 2026. -FU in 1 yr.  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  No problems  Doing great.  No itching.  Her upper GI symptoms are well-controlled on as needed Protonix .  Has lost some weight as below.  No odynophagia or dysphagia.  She has been taking MiraLAX 17 g p.o. daily with resultant improvement in her constipation.  She has been drinking enough water.  Had negative colonoscopy except for fair prep as below  No alcohol.  No jaundice dark urine or pale stools   FH - colon ca-paternal aunt.  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 Mildly lobular contour which could  reflect underlying cirrhosis. No focal lesion identified.   Colon 06/14/2021 (fair prep) - Non-bleeding internal hemorrhoids. - Otherwise grossly normal colonoscopy to cecum. - No specimens collected. - Repeat colonoscopy in 5 years for screening purposes d/t quality of preparation/previous history of polyp with 2-day prep. Earlier, if clinically indicated.  EGD 05/2021 - Gastritis. Bx- neg for HP - A few gastric polyps. Resected and retrieved x 3. Bx-fundic gland polyps - Neg SB bx for celiac   Colonoscopy 05/02/2016 - One 1 mm polyp in the cecum, removed with a cold snare. Resected and retrieved. - The examination was otherwise normal on direct and retroflexion views.  EGD 09/2011: Nl   Liver Bx 06/07/2021 A. LIVER, LEFT LOBE, NEEDLE CORE BIOPSY:  - Patchy portal-based inflammation and mild to moderate lobular  inflammation and loss of bile ducts;  - Minimal fibrosis (stage 0-1 of 4)  Past Medical History:  Diagnosis Date   Allergic state 12/08/2016   Allergy    Anxiety    Back pain     i just have a weak loer back    Fibroids  GERD (gastroesophageal reflux disease)    Hyperlipidemia 06/09/2017   Insulin  resistance    Joint pain    Knee pain, right 03/10/2017   Plantar fasciitis    PONV (postoperative nausea and vomiting)    just nausea    Shortness of breath    not only if  i exert myselg    Sinusitis    Sleep apnea    Sleep apnea in adult 12/26/2020   Vitamin D  deficiency 11/22/2016   Vitamin D  deficiency     Past Surgical History:  Procedure Laterality Date   COLONOSCOPY  02/2009   with Abran normal   CYSTOSCOPY N/A 10/12/2019   Procedure: CYSTOSCOPY;  Surgeon: Delana Ted Morrison, DO;  Location: [facility];  Service: Gynecology;  Laterality: N/A;   ESOPHAGOGASTRODUODENOSCOPY  2012   GANGLION CYST EXCISION Right    right- wrist    NASAL SEPTUM SURGERY     NASAL SINUS SURGERY     RIGHT/LEFT HEART CATH AND CORONARY ANGIOGRAPHY N/A  10/29/2024   Procedure: RIGHT/LEFT HEART CATH AND CORONARY ANGIOGRAPHY;  Surgeon: Wendel Lurena POUR, MD;  Location: [facility];  Service: Cardiovascular;  Laterality: N/A;   TMJ ARTHROSCOPY     left   TOTAL ABDOMINAL HYSTERECTOMY  09/2019   TOTAL LAPAROSCOPIC HYSTERECTOMY WITH BILATERAL SALPINGO OOPHORECTOMY Bilateral 10/12/2019   Procedure: TOTAL LAPAROSCOPIC HYSTERECTOMY WITH BILATERAL SALPINGO OOPHORECTOMY, Lysis Pelvic Adhseions ;  Surgeon: Delana Ted Morrison, DO;  Location: [facility];  Service: Gynecology;  Laterality: Bilateral;   TRANSESOPHAGEAL ECHOCARDIOGRAM (CATH LAB) N/A 10/29/2024   Procedure: TRANSESOPHAGEAL ECHOCARDIOGRAM;  Surgeon: Loni Soyla LABOR, MD;  Location: [facility];  Service: Cardiovascular;  Laterality: N/A;    Family History  Problem Relation Age of Onset   Breast cancer Mother 51   Cancer Mother        breast   Thyroid  disease Mother    Sleep apnea Mother    Melanoma Father    Hypertension Father    Diabetes Father    Hyperlipidemia Father    Obesity Sister    Diabetes Sister    Hypertension Sister    Pancreatitis Sister    Heart disease Maternal Grandmother        MI   Cancer Maternal Grandfather        lung, smoker   Diabetes Maternal Grandfather    Diabetes Paternal Grandmother    Heart disease Paternal Grandmother    Colon cancer Paternal Grandfather        died at age 33   Cancer Paternal Grandfather        GI CA   Esophageal cancer Neg Hx    Stomach cancer Neg Hx    Stroke Neg Hx    Colon polyps Neg Hx    Rectal cancer Neg Hx    Pancreatic cancer Neg Hx     Social History   Tobacco Use   Smoking status: Never   Smokeless tobacco: Never  Vaping Use   Vaping status: Never Used  Substance Use Topics   Alcohol use: Yes    Alcohol/week: 0.0 standard drinks of alcohol    Comment: rare   Drug use: No    Current Outpatient Medications  Medication Sig Dispense Refill   acetaminophen  (TYLENOL ) 500  MG tablet Take 500 mg by mouth every 6 (six) hours as needed for headache.     aspirin EC 81 MG tablet Take 81 mg by mouth at bedtime. Swallow whole.  atorvastatin  (LIPITOR) 20 MG tablet Take 1 tablet (20 mg total) by mouth daily. 90 tablet 3   buPROPion  (WELLBUTRIN ) 75 MG tablet Take 1 tablet (75 mg total) by mouth daily. 90 tablet 1   Coenzyme Q10 (CO Q 10 PO) Take 1 capsule by mouth at bedtime.     EPINEPHrine  (EPIPEN  2-PAK) 0.3 mg/0.3 mL IJ SOAJ injection Inject 0.3 mg into the muscle as needed for anaphylaxis (repeat in 15 minutes, if symptoms return seek care). 2 each 1   escitalopram  (LEXAPRO ) 10 MG tablet Take 1 tablet (10 mg total) by mouth daily. (Patient taking differently: Take 10 mg by mouth at bedtime.) 90 tablet 0   famotidine  (PEPCID ) 20 MG tablet Take 20 mg by mouth daily at 12 noon.     fluticasone  (FLONASE ) 50 MCG/ACT nasal spray Place 2 sprays into both nostrils daily. (Patient taking differently: Place 2 sprays into both nostrils daily as needed.) 16 g 1   furosemide  (LASIX ) 20 MG tablet Take 1 tablet (20 mg total) by mouth daily. 30 tablet 1   loratadine (CLARITIN) 10 MG tablet Take 10 mg by mouth daily as needed for allergies.     losartan  (COZAAR ) 25 MG tablet Take 1 tablet (25 mg total) by mouth daily. 30 tablet 3   metFORMIN  (GLUCOPHAGE -XR) 500 MG 24 hr tablet Take 1 tablet (500 mg total) by mouth daily with breakfast. 90 tablet 1   Olopatadine HCl (PATADAY OP) Place 1 drop into both eyes daily as needed.     Omega-3 Fatty Acids (FISH OIL) 500 MG CAPS Take 1,000 mg by mouth at bedtime.     pantoprazole  (PROTONIX ) 40 MG tablet Take 1 tablet (40 mg total) by mouth daily as needed.     Polyethylene Glycol 3350 (MIRALAX PO) Take 1 Capful by mouth daily at 12 noon.     promethazine -dextromethorphan (PROMETHAZINE -DM) 6.25-15 MG/5ML syrup Take 5 mLs by mouth 4 (four) times daily as needed. 118 mL 0   Propylene Glycol (SYSTANE COMPLETE PF OP) Place 1 drop into both eyes daily  as needed.     Pyridoxine HCl (VITAMIN B6 PO) Take 1 tablet by mouth daily at 12 noon. Vitmain b with folate     sucralfate  (CARAFATE ) 1 g tablet Take 1 tablet (1 g total) by mouth 4 (four) times daily -  with meals and at bedtime. 40 tablet 0   tretinoin  (RETIN-A ) 0.1 % cream Apply topically at bedtime. (Patient taking differently: Apply 1 Application topically at bedtime as needed.) 45 g 1   triamcinolone  ointment (KENALOG ) 0.1 % Apply 1 Application topically daily as needed. Use for 2 weeks on 2 weeks off. 454 g 2   ursodiol  (URSO  FORTE) 500 MG tablet Take 1 tablet (500 mg total) by mouth 3 (three) times daily. (Patient taking differently: Take 500 mg by mouth See admin instructions. 1000mg  in the morning and 500mg  at night) 270 tablet 0   Vitamin D , Cholecalciferol , 25 MCG (1000 UT) TABS Take 4,000 units daily 60 tablet    No current facility-administered medications for this visit.    Allergies  Allergen Reactions   Coconut Fatty Acid Hives   Codeine Hives and Itching   Mango Flavoring Agent (Non-Screening) Hives   [name] Itching and Swelling   Oxycodone-Acetaminophen      rash on face and head, itching   Tussionex Pennkinetic Er [Hydrocod Poli-Chlorphe Poli Er]     rash on face and head, itching   Ultram [Tramadol] Other (See Comments)  Dizzy and low blood pressure.     Review of Systems:  Psychiatric/Behavioral: Has anxiety or depression     Physical Exam:    BP 130/78   Pulse 84   Ht 5' 5 (1.651 m)   Wt 248 lb (112.5 kg)   LMP 11/22/2018 (Approximate)   BMI 41.27 kg/m  Wt Readings from Last 3 Encounters:  11/12/24 248 lb (112.5 kg)  11/02/24 247 lb 6.4 oz (112.2 kg)  10/25/24 253 lb (114.8 kg)   Constitutional:  Well-developed, in no acute distress. Psychiatric: Normal mood and affect. Behavior is normal. HEENT: Pupils normal.  Conjunctivae are normal. No scleral icterus. Cardiovascular: Normal rate, regular rhythm. No edema Pulmonary/chest: Effort normal  and breath sounds normal. No wheezing, rales or rhonchi. Abdominal: Soft, nondistended.  Mild epigastric tenderness and mild left lower quadrant abdominal tenderness without rebound.. Bowel sounds active throughout. There are no masses palpable.  Liver is palpated 3 cm below the costal margin. Neurological: Alert and oriented to person place and time. Skin: Skin is warm and dry. No rashes noted.  Data Reviewed: I have personally reviewed following labs and imaging studies  From 10/18/2024-hemoglobin 12.9, MCV 86.9, platelets 346    Latest Ref Rng & Units 10/18/2024    5:16 PM 05/24/2024    8:05 AM 11/18/2023    3:10 PM  Hepatic Function  Total Protein 6.5 - 8.1 g/dL 7.2  6.9  6.9   Albumin 3.5 - 5.0 g/dL 4.4  4.3  4.4   AST 15 - 41 U/L 22  15  13    ALT 0 - 44 U/L 23  16  18    Alk Phosphatase 38 - 126 U/L 164  149  148   Total Bilirubin 0.0 - 1.2 mg/dL 0.5  0.3  0.3   Bilirubin, Direct 0.0 - 0.2 mg/dL 0.2       Anselm Bring, MD 11/12/2024, 4:19 PM  Cc: Domenica Harlene LABOR, MDp

## 2024-11-15 ENCOUNTER — Telehealth: Payer: Self-pay

## 2024-11-15 DIAGNOSIS — Z0279 Encounter for issue of other medical certificate: Secondary | ICD-10-CM

## 2024-11-15 NOTE — Telephone Encounter (Signed)
 I called and spoke with patient and she only needs forms completed when she was seen here for Covid and written out 12,13, and 14th.  CLINICAL USE BELOW THIS LINE (use X to signify action taken)  _x_ Form received and placed in providers office for signature. ___ Form completed and faxed to LOA Dept.  ___ Form completed & LVM to notify patient ready for pick up.  ___ Charge sheet and copy of form in front office folder for office supervisor.

## 2024-11-16 ENCOUNTER — Telehealth: Payer: Self-pay | Admitting: Cardiovascular Disease

## 2024-11-16 ENCOUNTER — Other Ambulatory Visit (HOSPITAL_BASED_OUTPATIENT_CLINIC_OR_DEPARTMENT_OTHER): Payer: Self-pay

## 2024-11-16 NOTE — Telephone Encounter (Signed)
 I called and spoke with patient and notified her that form were completed and wil be faxed back to [employer] at [phone].  CLINICAL USE BELOW THIS LINE (use X to signify action taken)  ___ Form received and placed in providers office for signature. _X_ Form completed and faxed to LOA Dept.  ___ Form completed & LVM to notify patient ready for pick up.  ___ Charge sheet and copy of form in front office folder for office supervisor.

## 2024-11-16 NOTE — Telephone Encounter (Signed)
 We received a Disability form from [employer] and an FMLA form from [employer].  Patient signed both Release of Information forms and paid $58 Visa by phone.  Forms in Dr. Rosslyn box.

## 2024-11-19 ENCOUNTER — Other Ambulatory Visit (HOSPITAL_BASED_OUTPATIENT_CLINIC_OR_DEPARTMENT_OTHER): Payer: Self-pay

## 2024-11-22 ENCOUNTER — Encounter: Payer: Self-pay | Admitting: *Deleted

## 2024-11-22 ENCOUNTER — Other Ambulatory Visit (HOSPITAL_BASED_OUTPATIENT_CLINIC_OR_DEPARTMENT_OTHER): Payer: Self-pay

## 2024-11-22 ENCOUNTER — Other Ambulatory Visit: Payer: Self-pay

## 2024-11-22 ENCOUNTER — Other Ambulatory Visit: Payer: Self-pay | Admitting: Family Medicine

## 2024-11-22 MED ORDER — METFORMIN HCL ER 500 MG PO TB24
500.0000 mg | ORAL_TABLET | Freq: Every day | ORAL | 1 refills | Status: AC
  Filled 2024-11-22: qty 90, 90d supply, fill #0

## 2024-11-25 ENCOUNTER — Other Ambulatory Visit (HOSPITAL_BASED_OUTPATIENT_CLINIC_OR_DEPARTMENT_OTHER): Payer: Self-pay

## 2024-11-25 ENCOUNTER — Other Ambulatory Visit: Payer: Self-pay | Admitting: Family Medicine

## 2024-11-25 MED ORDER — FLUTICASONE PROPIONATE 50 MCG/ACT NA SUSP
2.0000 | Freq: Every day | NASAL | 1 refills | Status: DC
  Filled 2024-11-27: qty 16, 30d supply, fill #0
  Filled 2024-12-28: qty 16, 30d supply, fill #1

## 2024-11-27 ENCOUNTER — Other Ambulatory Visit (HOSPITAL_BASED_OUTPATIENT_CLINIC_OR_DEPARTMENT_OTHER): Payer: Self-pay

## 2024-11-28 ENCOUNTER — Other Ambulatory Visit: Payer: Self-pay

## 2024-11-29 NOTE — Assessment & Plan Note (Signed)
 Scheduled for mitral valve repair or replacement at [facility] with Dr. Johann on December 18th, 2025.

## 2024-11-29 NOTE — Progress Notes (Unsigned)
 Subjective:     Patient ID: Hailey Noble, female    DOB: 07/10/1966, 58 y.o.   MRN: [id number]  No chief complaint on file.   HPI  Discussed the use of AI scribe software for clinical note transcription with the patient, who gave verbal consent to proceed.  How much vitamin D  taking??   Bone Density scan-- r/t PPI chornic  FH colon caner  Follow with: GI, Cardiology,  History of Present Illness              Health Maintenance Due  Topic Date Due   Pneumococcal Vaccine: 50+ Years (1 of 2 - PCV) Never done   COVID-19 Vaccine (4 - 2025-26 season) 08/23/2024   Mammogram  12/21/2024    Past Medical History:  Diagnosis Date   Allergic state 12/08/2016   Allergy    Anxiety    Back pain     i just have a weak loer back    Fibroids    GERD (gastroesophageal reflux disease)    Hyperlipidemia 06/09/2017   Insulin  resistance    Joint pain    Knee pain, right 03/10/2017   Plantar fasciitis    PONV (postoperative nausea and vomiting)    just nausea    Shortness of breath    not only if  i exert myselg    Sinusitis    Sleep apnea    Sleep apnea in adult 12/26/2020   Vitamin D  deficiency 11/22/2016   Vitamin D  deficiency     Past Surgical History:  Procedure Laterality Date   COLONOSCOPY  02/2009   with Abran normal   CYSTOSCOPY N/A 10/12/2019   Procedure: CYSTOSCOPY;  Surgeon: Delana Ted Morrison, DO;  Location: [facility];  Service: Gynecology;  Laterality: N/A;   ESOPHAGOGASTRODUODENOSCOPY  2012   GANGLION CYST EXCISION Right    right- wrist    NASAL SEPTUM SURGERY     NASAL SINUS SURGERY     RIGHT/LEFT HEART CATH AND CORONARY ANGIOGRAPHY N/A 10/29/2024   Procedure: RIGHT/LEFT HEART CATH AND CORONARY ANGIOGRAPHY;  Surgeon: Wendel Lurena POUR, MD;  Location: [facility];  Service: Cardiovascular;  Laterality: N/A;   TMJ ARTHROSCOPY     left   TOTAL ABDOMINAL HYSTERECTOMY  09/2019   TOTAL LAPAROSCOPIC HYSTERECTOMY WITH BILATERAL  SALPINGO OOPHORECTOMY Bilateral 10/12/2019   Procedure: TOTAL LAPAROSCOPIC HYSTERECTOMY WITH BILATERAL SALPINGO OOPHORECTOMY, Lysis Pelvic Adhseions ;  Surgeon: Delana Ted Morrison, DO;  Location: [facility];  Service: Gynecology;  Laterality: Bilateral;   TRANSESOPHAGEAL ECHOCARDIOGRAM (CATH LAB) N/A 10/29/2024   Procedure: TRANSESOPHAGEAL ECHOCARDIOGRAM;  Surgeon: Loni Soyla LABOR, MD;  Location: [facility];  Service: Cardiovascular;  Laterality: N/A;    Family History  Problem Relation Age of Onset   Breast cancer Mother 38   Cancer Mother        breast   Thyroid  disease Mother    Sleep apnea Mother    Melanoma Father    Hypertension Father    Diabetes Father    Hyperlipidemia Father    Obesity Sister    Diabetes Sister    Hypertension Sister    Pancreatitis Sister    Heart disease Maternal Grandmother        MI   Cancer Maternal Grandfather        lung, smoker   Diabetes Maternal Grandfather    Diabetes Paternal Grandmother    Heart disease Paternal Grandmother    Colon cancer Paternal Grandfather  died at age 15   Cancer Paternal Grandfather        GI CA   Esophageal cancer Neg Hx    Stomach cancer Neg Hx    Stroke Neg Hx    Colon polyps Neg Hx    Rectal cancer Neg Hx    Pancreatic cancer Neg Hx     Social History   Socioeconomic History   Marital status: Married    Spouse name: Alm   Number of children: Not on file   Years of education: Not on file   Highest education level: Bachelor's degree (e.g., BA, AB, BS)  Occupational History   Occupation: Teacher, Adult Education: [employer]  Tobacco Use   Smoking status: Never   Smokeless tobacco: Never  Vaping Use   Vaping status: Never Used  Substance and Sexual Activity   Alcohol use: Yes    Alcohol/week: 0.0 standard drinks of alcohol    Comment: rare   Drug use: No   Sexual activity: Yes    Birth control/protection: I.U.D.  Other Topics Concern   Not on file  Social History  Narrative   Originally from [place], spent 7 years as a traveling [employer]ITY [employer] [phone]). Works at [employer] center. No dietary restrictions. Lives with dog      Right Handed   1 cup of Coffee per Day   Social Drivers of Health   Financial Resource Strain: Low Risk  (11/02/2024)   Overall Financial Resource Strain (CARDIA)    Difficulty of Paying Living Expenses: Not hard at all  Food Insecurity: No Food Insecurity (11/02/2024)   Hunger Vital Sign    Worried About Running Out of Food in the Last Year: Never true    Ran Out of Food in the Last Year: Never true  Transportation Needs: No Transportation Needs (11/02/2024)   PRAPARE - Administrator, Civil Service (Medical): No    Lack of Transportation (Non-Medical): No  Physical Activity: Insufficiently Active (11/02/2024)   Exercise Vital Sign    Days of Exercise per Week: 4 days    Minutes of Exercise per Session: 30 min  Stress: Stress Concern Present (11/02/2024)   [employer] of Occupational Health - Occupational Stress Questionnaire    Feeling of Stress: To some extent  Social Connections: Moderately Isolated (11/02/2024)   Social Connection and Isolation Panel    Frequency of Communication with Friends and Family: More than three times a week    Frequency of Social Gatherings with Friends and Family: Once a week    Attends Religious Services: Never    Database Administrator or Organizations: No    Attends Engineer, Structural: Not on file    Marital Status: Married  Catering Manager Violence: Not on file    Outpatient Medications Prior to Visit  Medication Sig Dispense Refill   acetaminophen  (TYLENOL ) 500 MG tablet Take 500 mg by mouth every 6 (six) hours as needed for headache.     aspirin EC 81 MG tablet Take 81 mg by mouth at bedtime. Swallow whole.     atorvastatin  (LIPITOR) 20 MG tablet Take 1 tablet (20 mg total) by mouth daily. 90 tablet 3   buPROPion  (WELLBUTRIN ) 75 MG tablet Take 1 tablet (75  mg total) by mouth daily. 90 tablet 1   Coenzyme Q10 (CO Q 10 PO) Take 1 capsule by mouth at bedtime.     EPINEPHrine  (EPIPEN  2-PAK) 0.3 mg/0.3 mL IJ SOAJ injection Inject 0.3 mg  into the muscle as needed for anaphylaxis (repeat in 15 minutes, if symptoms return seek care). 2 each 1   escitalopram  (LEXAPRO ) 10 MG tablet Take 1 tablet (10 mg total) by mouth daily. (Patient taking differently: Take 10 mg by mouth at bedtime.) 90 tablet 0   famotidine  (PEPCID ) 20 MG tablet Take 20 mg by mouth daily at 12 noon.     fluticasone  (FLONASE ) 50 MCG/ACT nasal spray Place 2 sprays into both nostrils daily. 16 g 1   furosemide  (LASIX ) 20 MG tablet Take 1 tablet (20 mg total) by mouth daily. 30 tablet 1   loratadine (CLARITIN) 10 MG tablet Take 10 mg by mouth daily as needed for allergies.     losartan  (COZAAR ) 25 MG tablet Take 1 tablet (25 mg total) by mouth daily. 30 tablet 3   metFORMIN  (GLUCOPHAGE -XR) 500 MG 24 hr tablet Take 1 tablet (500 mg total) by mouth daily with breakfast. 90 tablet 1   Olopatadine HCl (PATADAY OP) Place 1 drop into both eyes daily as needed.     Omega-3 Fatty Acids (FISH OIL) 500 MG CAPS Take 1,000 mg by mouth at bedtime.     pantoprazole  (PROTONIX ) 40 MG tablet Take 1 tablet (40 mg total) by mouth daily as needed. 90 tablet 4   Polyethylene Glycol 3350 (MIRALAX PO) Take 1 Capful by mouth daily at 12 noon.     promethazine -dextromethorphan (PROMETHAZINE -DM) 6.25-15 MG/5ML syrup Take 5 mLs by mouth 4 (four) times daily as needed. 118 mL 0   Propylene Glycol (SYSTANE COMPLETE PF OP) Place 1 drop into both eyes daily as needed.     Pyridoxine HCl (VITAMIN B6 PO) Take 1 tablet by mouth daily at 12 noon. Vitmain b with folate     sucralfate  (CARAFATE ) 1 g tablet Take 1 tablet (1 g total) by mouth 4 (four) times daily -  with meals and at bedtime. 40 tablet 0   tretinoin  (RETIN-A ) 0.1 % cream Apply topically at bedtime. (Patient taking differently: Apply 1 Application topically at  bedtime as needed.) 45 g 1   triamcinolone  ointment (KENALOG ) 0.1 % Apply 1 Application topically daily as needed. Use for 2 weeks on 2 weeks off. 454 g 2   ursodiol  (URSO  FORTE) 500 MG tablet Take 1 tablet (500 mg total) by mouth 3 (three) times daily. 270 tablet 4   Vitamin D , Cholecalciferol , 25 MCG (1000 UT) TABS Take 4,000 units daily 60 tablet    No facility-administered medications prior to visit.    Allergies  Allergen Reactions   Coconut Fatty Acid Hives   Codeine Hives and Itching   Mango Flavoring Agent (Non-Screening) Hives   [name] Itching and Swelling   Oxycodone-Acetaminophen      rash on face and head, itching   Tussionex Pennkinetic Er [Hydrocod Poli-Chlorphe Poli Er]     rash on face and head, itching   Ultram [Tramadol] Other (See Comments)    Dizzy and low blood pressure.     ROS     Objective:    Physical Exam   LMP 11/22/2018 (Approximate)  Wt Readings from Last 3 Encounters:  11/12/24 248 lb (112.5 kg)  11/02/24 247 lb 6.4 oz (112.2 kg)  10/25/24 253 lb (114.8 kg)       Assessment & Plan:   Problem List Items Addressed This Visit   None   I am having Jozlin I. Visconti maintain her Vitamin D  (Cholecalciferol ), acetaminophen , Polyethylene Glycol 3350 (MIRALAX PO), EPINEPHrine , atorvastatin , tretinoin , triamcinolone  ointment, escitalopram ,  sucralfate , losartan , famotidine , loratadine, Pyridoxine HCl (VITAMIN B6 PO), aspirin EC, Coenzyme Q10 (CO Q 10 PO), Fish Oil, Olopatadine HCl (PATADAY OP), Propylene Glycol (SYSTANE COMPLETE PF OP), furosemide , promethazine -dextromethorphan, buPROPion , ursodiol , pantoprazole , metFORMIN , and fluticasone .  No orders of the defined types were placed in this encounter.

## 2024-11-30 ENCOUNTER — Telehealth: Payer: Commercial Managed Care - PPO | Admitting: Adult Health

## 2024-12-01 ENCOUNTER — Ambulatory Visit: Admitting: Student

## 2024-12-01 ENCOUNTER — Encounter: Payer: Self-pay | Admitting: Student

## 2024-12-01 VITALS — BP 136/70 | HR 86 | Ht 65.0 in | Wt 253.2 lb

## 2024-12-01 DIAGNOSIS — I340 Nonrheumatic mitral (valve) insufficiency: Secondary | ICD-10-CM

## 2024-12-01 DIAGNOSIS — E559 Vitamin D deficiency, unspecified: Secondary | ICD-10-CM

## 2024-12-01 DIAGNOSIS — R739 Hyperglycemia, unspecified: Secondary | ICD-10-CM

## 2024-12-01 DIAGNOSIS — K219 Gastro-esophageal reflux disease without esophagitis: Secondary | ICD-10-CM | POA: Diagnosis not present

## 2024-12-01 DIAGNOSIS — Z0000 Encounter for general adult medical examination without abnormal findings: Secondary | ICD-10-CM | POA: Insufficient documentation

## 2024-12-01 DIAGNOSIS — G4730 Sleep apnea, unspecified: Secondary | ICD-10-CM | POA: Diagnosis not present

## 2024-12-01 DIAGNOSIS — F418 Other specified anxiety disorders: Secondary | ICD-10-CM

## 2024-12-01 DIAGNOSIS — I10 Essential (primary) hypertension: Secondary | ICD-10-CM

## 2024-12-01 DIAGNOSIS — E7849 Other hyperlipidemia: Secondary | ICD-10-CM

## 2024-12-01 NOTE — Assessment & Plan Note (Signed)
 Supplement and monitor

## 2024-12-01 NOTE — Assessment & Plan Note (Signed)
 Stable on current medications

## 2024-12-01 NOTE — Assessment & Plan Note (Signed)
 [id number] acceptable, minimize simple carbs. Increase exercise as tolerated.

## 2024-12-01 NOTE — Assessment & Plan Note (Signed)
 Tolerating Statin. Encourage heart healthy diet such as MIND or DASH diet, increase exercise, avoid trans fats, simple carbohydrates and processed foods, consider a krill or fish or flaxseed oil cap daily.

## 2024-12-01 NOTE — Assessment & Plan Note (Signed)
Patient encouraged to maintain heart healthy diet, regular exercise, adequate sleep. Consider daily probiotics 

## 2024-12-01 NOTE — Assessment & Plan Note (Signed)
 Long-term Protonix  use discussed. No current bone health concerns. Decision to maintain PPI regimen due to upcoming surgery. - Continue current Protonix  regimen. - Reassess PPI use post-surgery.

## 2024-12-02 ENCOUNTER — Other Ambulatory Visit (INDEPENDENT_AMBULATORY_CARE_PROVIDER_SITE_OTHER)

## 2024-12-02 DIAGNOSIS — K760 Fatty (change of) liver, not elsewhere classified: Secondary | ICD-10-CM

## 2024-12-02 DIAGNOSIS — K743 Primary biliary cirrhosis: Secondary | ICD-10-CM

## 2024-12-02 DIAGNOSIS — E7849 Other hyperlipidemia: Secondary | ICD-10-CM | POA: Diagnosis not present

## 2024-12-02 DIAGNOSIS — F418 Other specified anxiety disorders: Secondary | ICD-10-CM | POA: Diagnosis not present

## 2024-12-02 DIAGNOSIS — K219 Gastro-esophageal reflux disease without esophagitis: Secondary | ICD-10-CM

## 2024-12-02 DIAGNOSIS — K7460 Unspecified cirrhosis of liver: Secondary | ICD-10-CM | POA: Diagnosis not present

## 2024-12-02 DIAGNOSIS — E559 Vitamin D deficiency, unspecified: Secondary | ICD-10-CM

## 2024-12-02 DIAGNOSIS — K581 Irritable bowel syndrome with constipation: Secondary | ICD-10-CM

## 2024-12-02 DIAGNOSIS — Z0000 Encounter for general adult medical examination without abnormal findings: Secondary | ICD-10-CM | POA: Diagnosis not present

## 2024-12-02 DIAGNOSIS — I10 Essential (primary) hypertension: Secondary | ICD-10-CM | POA: Diagnosis not present

## 2024-12-02 DIAGNOSIS — R1013 Epigastric pain: Secondary | ICD-10-CM

## 2024-12-02 DIAGNOSIS — R739 Hyperglycemia, unspecified: Secondary | ICD-10-CM | POA: Diagnosis not present

## 2024-12-02 LAB — CBC WITH DIFFERENTIAL/PLATELET
Basophils Absolute: 0.1 K/uL (ref 0.0–0.1)
Basophils Relative: 0.9 % (ref 0.0–3.0)
Eosinophils Absolute: 0.2 K/uL (ref 0.0–0.7)
Eosinophils Relative: 2.4 % (ref 0.0–5.0)
HCT: 37.8 % (ref 36.0–46.0)
Hemoglobin: 12.5 g/dL (ref 12.0–15.0)
Lymphocytes Relative: 21.2 % (ref 12.0–46.0)
Lymphs Abs: 2.1 K/uL (ref 0.7–4.0)
MCHC: 33.2 g/dL (ref 30.0–36.0)
MCV: 87.2 fl (ref 78.0–100.0)
Monocytes Absolute: 0.6 K/uL (ref 0.1–1.0)
Monocytes Relative: 6.3 % (ref 3.0–12.0)
Neutro Abs: 6.9 K/uL (ref 1.4–7.7)
Neutrophils Relative %: 69.2 % (ref 43.0–77.0)
Platelets: 307 K/uL (ref 150.0–400.0)
RBC: 4.34 Mil/uL (ref 3.87–5.11)
RDW: 13.7 % (ref 11.5–15.5)
WBC: 10 K/uL (ref 4.0–10.5)

## 2024-12-02 LAB — LIPID PANEL
Cholesterol: 95 mg/dL (ref 0–200)
HDL: 41.3 mg/dL (ref >39.00)
LDL Cholesterol: 26 mg/dL (ref 0–99)
NonHDL: 53.22
Total CHOL/HDL Ratio: 2
Triglycerides: 134 mg/dL (ref 0.0–149.0)
VLDL: 26.8 mg/dL (ref 0.0–40.0)

## 2024-12-02 LAB — COMPREHENSIVE METABOLIC PANEL WITH GFR
ALT: 17 U/L (ref 0–35)
AST: 16 U/L (ref 0–37)
Albumin: 4.2 g/dL (ref 3.5–5.2)
Alkaline Phosphatase: 151 U/L — ABNORMAL HIGH (ref 39–117)
BUN: 17 mg/dL (ref 6–23)
CO2: 29 meq/L (ref 19–32)
Calcium: 9.1 mg/dL (ref 8.4–10.5)
Chloride: 104 meq/L (ref 96–112)
Creatinine, Ser: 0.59 mg/dL (ref 0.40–1.20)
GFR: 98.53 mL/min (ref >60.00)
Glucose, Bld: 90 mg/dL (ref 70–99)
Potassium: 3.9 meq/L (ref 3.5–5.1)
Sodium: 140 meq/L (ref 135–145)
Total Bilirubin: 0.3 mg/dL (ref 0.2–1.2)
Total Protein: 6.6 g/dL (ref 6.0–8.3)

## 2024-12-02 LAB — PROTIME-INR
INR: 1.1 ratio — ABNORMAL HIGH (ref 0.8–1.0)
INR: 1.1 ratio — ABNORMAL HIGH (ref 0.8–1.0)
Prothrombin Time: 11.3 s (ref 9.6–13.1)
Prothrombin Time: 11.3 s (ref 9.6–13.1)

## 2024-12-02 LAB — TSH: TSH: 2.26 u[IU]/mL (ref 0.35–5.50)

## 2024-12-02 LAB — VITAMIN D 25 HYDROXY (VIT D DEFICIENCY, FRACTURES): VITD: 43.7 ng/mL (ref 30.00–100.00)

## 2024-12-02 LAB — HEMOGLOBIN A1C: Hgb A1c MFr Bld: 5.7 % (ref 4.6–6.5)

## 2024-12-03 ENCOUNTER — Ambulatory Visit: Payer: Self-pay | Admitting: Student

## 2024-12-06 LAB — AFP TUMOR MARKER: AFP-Tumor Marker: 1.3 ng/mL

## 2024-12-07 ENCOUNTER — Ambulatory Visit: Payer: Self-pay | Admitting: Gastroenterology

## 2024-12-13 ENCOUNTER — Other Ambulatory Visit (HOSPITAL_BASED_OUTPATIENT_CLINIC_OR_DEPARTMENT_OTHER): Payer: Self-pay

## 2024-12-13 ENCOUNTER — Other Ambulatory Visit: Payer: Self-pay

## 2024-12-13 ENCOUNTER — Other Ambulatory Visit: Payer: Self-pay | Admitting: Family Medicine

## 2024-12-13 DIAGNOSIS — F418 Other specified anxiety disorders: Secondary | ICD-10-CM

## 2024-12-13 MED ORDER — ESCITALOPRAM OXALATE 10 MG PO TABS
10.0000 mg | ORAL_TABLET | Freq: Every day | ORAL | 1 refills | Status: AC
  Filled 2024-12-13: qty 90, 90d supply, fill #0

## 2024-12-14 NOTE — Telephone Encounter (Signed)
 Disability form was faxed to [employer] [facility] [employer] and the FMLA form was faxed to [employer].  Both forms scanned into chart.  Billing notified.

## 2024-12-14 NOTE — Progress Notes (Signed)
 Cardiothoracic Surgery Post-op Visit   Reason for visit:  Post-op   Procedure: R thorc MV repair performed on 12/09/2024.   Subjective:    HPI:  Patient is a 58 y.o. female is evaluated in the thoracic surgery clinic after successfully undergoing the above listed surgical procedure with Dr. Ricky.  The postoperative course was remarkable for need to return to the OR for CT removal. The patient was discharged to home on 12/14/2024.  Since discharge, she continues to progress well.  Ms. Peachey complains of chest pain and drainage from surgical incision. The patient  denies abdominal pain, palpitations, dyspnea, orthopnea, PND, cough, dizziness , fatigue, edema, weight gain, fevers, anorexia, nausea, vomiting, diarrhea, constipation , and voice hoarseness. Patient and Family member reports activity level and tolerance since discharge has been improving.  Current NYHA class II.  Current Outpatient Medications  Medication Sig Dispense Refill   ascorbic acid, vitamin C, 250 mg Chew Take 500 mg by mouth 2 (two) times daily as needed (prn)     aspirin 81 mg Cap Take 1 capsule by mouth once daily     buPROPion  (WELLBUTRIN ) 75 MG tablet Take 75 mg by mouth once daily     cephalexin  (KEFLEX ) 500 MG capsule Take 1 capsule (500 mg total) by mouth 2 (two) times daily for 7 days 14 capsule 0   cholecalciferol  1000 unit tablet Take by mouth Takes 4000 units daily     co-enzyme Q-10, ubiquinone, 30 mg capsule Take 10 mg by mouth once daily     EPINEPHrine  (EPIPEN ) 0.3 mg/0.3 mL auto-injector Inject 0.3 mg into the muscle once as needed for Anaphylaxis     escitalopram  oxalate (LEXAPRO ) 10 MG tablet Take 10 mg by mouth once daily     famotidine  (PEPCID ) 20 MG tablet Take 10 mg by mouth once daily as needed for Heartburn     fluticasone  propionate (FLONASE ) 50 mcg/actuation nasal spray Place 1 spray into both nostrils once daily as needed for Allergies     FUROsemide  (LASIX ) 40 MG tablet Take 1 tablet  (40 mg total) by mouth once daily 30 tablet 1   loratadine (CLARITIN) 10 mg tablet Take 10 mg by mouth once daily as needed for Allergies     metFORMIN  (GLUCOPHAGE -XR) 500 MG XR tablet Take 500 mg by mouth 2 (two) times daily     olopatadine HCl (PATADAY OPHTH) Apply 1 drop to eye once as needed (dry eyes)     pantoprazole  (PROTONIX ) 40 MG DR tablet Take 40 mg by mouth once daily     polyethylene glycol (MIRALAX) packet Take 1 packet (17 g total) by mouth 2 (two) times daily Stop if having diarrhea or not taking narcotics anymore. Mix in 4-8ounces of fluid prior to taking.     potassium chloride (KLOR-CON M20) 20 MEQ ER tablet Take 1 tablet (20 mEq total) by mouth once daily Take with lasix , stop if lasix  stopped 30 tablet 1   propylene glycol/peg 400/PF (SYSTANE, PF, OPHTH) Apply 1 drop to eye once daily as needed (dry eyes)     pyridoxine HCl, vitamin B6, (VITAMIN B-6 ORAL) Take 1 tablet by mouth once daily as needed (PRN)     sennosides-docusate (SENOKOT-S) 8.6-50 mg tablet Take 1 tablet by mouth 2 (two) times daily Stop if having diarrhea or not taking narcotics anymore.     tretinoin  (RETIN-A ) 0.1 % cream Apply 1 Application topically at bedtime as needed (PRN)     triamcinolone  0.1 % ointment Apply  1 Application topically once daily as needed (PRN)     ursodioL  (URSO  FORTE) 500 MG tablet Take 500 mg by mouth 2 (two) times daily Takes 1000 mg in the morning and 500 mg in the evening     No current facility-administered medications for this visit.   Allergies  Allergen Reactions   Codeine Hives and Itching    codeine   [name] Dizziness, Itching and Swelling   Oxycodone-Acetaminophen  Itching    rash on face and head, itching  acetaminophen  / oxycodone   Tramadol Dizziness and Other (See Comments)    Dizzy and low blood pressure.   tramadol   Coconut Itching and Rash   Hydrocodone-Chlorpheniramine Itching and Rash   Mango Itching and Rash   Past Medical  History:  Diagnosis Date   CAD (coronary artery disease)    Hyperlipidemia    Obesity, Class III, BMI 40-49.9 (morbid obesity) (CMS-HCC)    OSA on CPAP    Primary biliary cirrhosis (CMS/HHS-HCC)    Severe mitral regurgitation    Past Surgical History:  Procedure Laterality Date   LAPAROSCOPIC TOTAL HYSTERECTOMY  2020   REPAIR MITRAL VALVE VIA HEARTPORT N/A 12/09/2024   Procedure: ADULT, VALVULOPLASTY, MITRAL VALVE, VIA HEARTPORT, WITH CARDIOPULMONARY BYPASS; RADICALRECONSTRUCTION, WITH OR WITHOUT RING;  Surgeon: Ricky Reyes Prude, MD;  Location: [facility];  Service: Cardiothoracic;  Laterality: N/A;   EXPLORATION CHEST FOR POSTOPERATIVE THROMBOSIS/INFECTION THORACOTOMY N/A 12/13/2024   Procedure: EXPLORATION CHEST FOR POSTOPERATIVE HEMORRHAGE, THROMBOSIS OR INFECTION THORACOTOMY;  Surgeon: Ricky Reyes Prude, MD;  Location: [facility];  Service: Cardiothoracic;  Laterality: N/A;   TEMPOROMANDIBULAR JOINT ARTHROPLASTY     ROS:  All negative except what is noted in the HPI.    Objective:   PE:  BP 123/75 (BP Location: Right upper arm, Patient Position: Sitting, BP Cuff Size: Large Adult)   Pulse (!) 118   Temp 36.8 C (98.2 F) (Oral)   Resp 18   Ht 165.1 cm (5' 5)   Wt (!) 113.4 kg (250 lb)   SpO2 96%   BMI 41.60 kg/m  General: well developed, well nourished, and in no acute distress Neurologic:  alert and oriented  x 4 and normal mood and affect  Neck:  supple, Trachea  is midline., and There is no JVD present.  Lungs:  clear to ausculatation bilaterally , no rhonchi, no wheezing, and no crackles      Heart:  regular rate and rhythm , S1 and S2 present , no murmur, no rub, and no gallop Abdomen: soft , nontender, nondistended, bowel sounds present in all four quadrants, no masses, no hepatosplenomegaly, and no palpable abdominal masses Extremities:  There is no peripherial cyanosis, clubbing or edema. Incisions: thoracotomy incision  scant  serosang drainage present  and all incisions have dehisced with superficial slough and scant drainage. No evidence of infection or surrounding erythremia. and Femoral incision  medial aspect of superior wound with opening and ovoid large firm area underneath incisions - pt and husband report no drainage and that the area has not grown in size and is getting smaller.  Diagnostic studies:  Recent Results (from the past 24 hours)  Complete Blood Count (CBC)   Collection Time: 12/30/24  9:59 AM  Result Value Ref Range   WBC (White Blood Cell Count) 12.1 (H) 3.2 - 9.8 x109/L   Hemoglobin 11.6 (L) 11.7 - 15.5 g/dL   Hematocrit 62.4 64.9 - 45.0 %   Platelets 534 (H) 150 - 450  x109/L   MCV (Mean Corpuscular Volume) 90 80 - 98 fL   MCH (Mean Corpuscular Hemoglobin) 27.7 26.5 - 34.0 pg   MCHC (Mean Corpuscular Hemoglobin Concentration) 30.9 (L) 31.0 - 36.0 %   RBC (Red Blood Cell Count) 4.19 3.77 - 5.16 x1012/L   RDW-CV (Red Cell Distribution Width) 13.4 11.5 - 14.5 %   NRBC (Nucleated Red Blood Cell Count) 0.00 0 x109/L   NRBC % (Nucleated Red Blood Cell %) 0.0 %   MPV (Mean Platelet Volume) 8.5 7.2 - 11.7 fL  Basic Metabolic Panel (BMP)   Collection Time: 12/30/24  9:59 AM  Result Value Ref Range   Sodium 139 135 - 145 mmol/L   Potassium 4.0 3.5 - 5.0 mmol/L   Chloride 102 98 - 108 mmol/L   Carbon Dioxide (CO2) 25 21 - 30 mmol/L   Urea Nitrogen (BUN) 13 7 - 20 mg/dL   Creatinine 0.7 0.4 - 1.0 mg/dL   Glucose 880 70 - 859 mg/dL   Calcium  9.3 8.7 - 10.2 mg/dL   Anion Gap 12 3 - 12 mmol/L   BUN/CREA Ratio 19 6 - 27   Glomerular Filtration Rate (eGFR)  100 mL/min/1.73sq m  ECG 12-lead   Collection Time: 12/30/24 10:12 AM  Result Value Ref Range   Vent Rate (bpm) 112    PR Interval (msec) 158    QRS Interval (msec) 88    QT Interval (msec) 304    QTc (msec) 414    PA and lateral chest x-ray:   EKG:   Assessment:    The patient is status post R thorc MV repair.  Since discharge  the patient is continues to progress well..  Plan:   Sutures removed without incident. Paint all incisions with betadine 2x/day Medications:  stop lasix  and potassium today, can take PRN for swelling or weight gain.   Antiplatelet/Anticoagulation Recommendation:  ASA   Additional Instructions:   Counseling discussion included heart healthy lifestyle inclusive of avoidance of tobacco products, weight control, blood pressure and blood sugar control, and regular exercise regimen. The patient was educated to take lifelong prophylactic antibiotics prior to any elective invasive procedures. No elective dental procedures for 3 months after surgery  The patient was encouraged to participate in cardiopulmonary rehabilitation. The patient was instructed to follow up with their cardiologist, primary care provider and return prn to the thoracic surgery clinic. The patient was instructed to call immediately with any signs of surgical wound infection.   The patient verbalized understanding and agreed to formulated plans.      Attestation Statement:   I personally performed the service, non-incident to. (WP)   NICOLE MARIE MASSEY, PA  NICOLE MARIE MASSEY, PA  Cardiothoracic Surgery

## 2024-12-14 NOTE — Telephone Encounter (Signed)
 Completed forms placed in [employer] box.

## 2024-12-15 ENCOUNTER — Other Ambulatory Visit (HOSPITAL_BASED_OUTPATIENT_CLINIC_OR_DEPARTMENT_OTHER): Payer: Self-pay

## 2024-12-15 ENCOUNTER — Telehealth (HOSPITAL_COMMUNITY): Payer: Self-pay

## 2024-12-15 ENCOUNTER — Encounter (HOSPITAL_COMMUNITY): Payer: Self-pay

## 2024-12-15 NOTE — Telephone Encounter (Signed)
Outside/paper referral received by Dr. Zebedee Iba from [facility]. Will fax over Physician order and request further documents. Insurance benefits and eligibility to be determined.

## 2024-12-15 NOTE — Telephone Encounter (Signed)
Attempted to call patient in regards to Cardiac Rehab - LM on VM   Sent letter 

## 2024-12-21 ENCOUNTER — Ambulatory Visit: Admission: EM | Admit: 2024-12-21 | Discharge: 2024-12-21 | Disposition: A | Discharge status: Home / Self Care

## 2024-12-21 DIAGNOSIS — I9789 Other postprocedural complications and disorders of the circulatory system, not elsewhere classified: Secondary | ICD-10-CM | POA: Diagnosis not present

## 2024-12-21 DIAGNOSIS — R0902 Hypoxemia: Secondary | ICD-10-CM

## 2024-12-21 DIAGNOSIS — T819XXA Unspecified complication of procedure, initial encounter: Secondary | ICD-10-CM

## 2024-12-21 DIAGNOSIS — R000 Tachycardia, unspecified: Secondary | ICD-10-CM | POA: Diagnosis not present

## 2024-12-21 NOTE — Discharge Instructions (Addendum)
 Call your surgeon or surgeon on call at [facility] Please go to ER for further evaluation of post op complications

## 2024-12-21 NOTE — ED Triage Notes (Signed)
 Patient reports having   Recent Surgery: Mitral valve repair on 12/13; scheduled follow-up with surgeon on 1/8.  Current Symptoms reported by the patient: Pain near bilateral clavicles for 2 nights; worsens with inspiration, still occurring on left side.   Redness and warmth at right chest incision (previous chest tube site).  Warmth at groin incision site.   Medications Taken: Robaxin and Tylenol .

## 2024-12-21 NOTE — ED Provider Notes (Signed)
 " [facility]    CSN: [id number] Arrival date & time: 12/21/24  1843      History   Chief Complaint Chief Complaint  Patient presents with   Wound Check    HPI Hailey Noble is a 58 y.o. female.   Pt presents to urgent care for evaluation of mitral valve/Heart surgery incision on December 09, 2024, released from hospital 1 week ago. Pt reports incision is red and hot to right chest wall as well as right groin, patient states it feels like the area in her groin may be getting bigger. Pt awakened up at 1 am with muscle pain. Pt called surgeon and has not heard back. Pt and husband concerned she has a wound/incision infection.  The history is provided by the patient. No language interpreter was used.  Wound Check Pertinent negatives include no chest pain and no abdominal pain.    Past Medical History:  Diagnosis Date   Allergic state 12/08/2016   Allergy    Anxiety    Back pain     i just have a weak loer back    Fibroids    GERD (gastroesophageal reflux disease)    Hyperlipidemia 06/09/2017   Hypoxia 12/21/2024   Insulin  resistance    Joint pain    Knee pain, right 03/10/2017   Plantar fasciitis    PONV (postoperative nausea and vomiting)    just nausea    Shortness of breath    not only if  i exert myselg    Sinusitis    Sleep apnea    Sleep apnea in adult 12/26/2020   Vitamin D  deficiency 11/22/2016   Vitamin D  deficiency     Patient Active Problem List   Diagnosis Date Noted   Complication of surgical procedure 12/21/2024   Hypoxia 12/21/2024   Tachycardia 12/21/2024   Gastroesophageal reflux disease 12/01/2024   Annual physical exam 12/01/2024   COVID-19 11/02/2024   Mitral regurgitation 10/25/2024   Cirrhosis of liver without ascites (HCC) 05/21/2024   Falls 11/11/2022   Sun-damaged skin 11/11/2022   Elevated alkaline phosphatase level 11/11/2022   Low back pain 11/11/2022   Allergic contact dermatitis due to plants, except food  08/29/2022   Class 3 severe obesity without serious comorbidity with body mass index (BMI) of 40.0 to 44.9 in adult (HCC) 08/29/2022   Plantar fasciitis of left foot 04/12/2022   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 04/12/2022   Skin lesion 01/30/2021   Pruritus 01/30/2021   Other rhinitis 01/30/2021   Adverse reaction to food, subsequent encounter 01/30/2021   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 06/01/2020   Left arm pain 06/01/2020   Peroneal tendinitis of left lower leg 02/16/2020   Leukocytosis 12/02/2019   Hyperglycemia 12/02/2019   Pain of left heel 12/02/2019   Pelvic pain 10/12/2019   Post-operative state 10/12/2019   Pain, abdominal, RLQ 11/28/2017   Insulin  resistance 10/21/2017   Hyperlipidemia 06/09/2017   Knee pain, right 03/10/2017   Allergy 12/08/2016   Vitamin D  deficiency 11/22/2016   Depression with anxiety 04/06/2014   Obesity (BMI 30-39.9) 04/06/2014   Preventative health care 06/17/2011   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   INGUINAL PAIN, LEFT 12/05/2008    Past Surgical History:  Procedure Laterality Date   COLONOSCOPY  02/2009   with Abran normal   CYSTOSCOPY N/A 10/12/2019   Procedure: CYSTOSCOPY;  Surgeon: Delana Ted Morrison, DO;  Location: [facility];  Service: Gynecology;  Laterality: N/A;   ESOPHAGOGASTRODUODENOSCOPY  2012   GANGLION CYST EXCISION Right    right- wrist    NASAL SEPTUM SURGERY     NASAL SINUS SURGERY     RIGHT/LEFT HEART CATH AND CORONARY ANGIOGRAPHY N/A 10/29/2024   Procedure: RIGHT/LEFT HEART CATH AND CORONARY ANGIOGRAPHY;  Surgeon: Wendel Lurena POUR, MD;  Location: [facility];  Service: Cardiovascular;  Laterality: N/A;   TMJ ARTHROSCOPY     left   TOTAL ABDOMINAL HYSTERECTOMY  09/2019   TOTAL LAPAROSCOPIC HYSTERECTOMY WITH BILATERAL SALPINGO OOPHORECTOMY Bilateral 10/12/2019   Procedure: TOTAL LAPAROSCOPIC HYSTERECTOMY WITH BILATERAL SALPINGO OOPHORECTOMY,  Lysis Pelvic Adhseions ;  Surgeon: Delana Ted Morrison, DO;  Location: [facility];  Service: Gynecology;  Laterality: Bilateral;   TRANSESOPHAGEAL ECHOCARDIOGRAM (CATH LAB) N/A 10/29/2024   Procedure: TRANSESOPHAGEAL ECHOCARDIOGRAM;  Surgeon: Loni Soyla LABOR, MD;  Location: [facility];  Service: Cardiovascular;  Laterality: N/A;    OB History     Gravida  0   Para  0   Term  0   Preterm  0   AB  0   Living  0      SAB  0   IAB  0   Ectopic  0   Multiple  0   Live Births  0            Home Medications    Prior to Admission medications  Medication Sig Start Date End Date Taking? Authorizing Provider  acetaminophen  (TYLENOL ) 500 MG tablet Take 500 mg by mouth every 6 (six) hours as needed for headache.    [provider]  aspirin EC 81 MG tablet Take 81 mg by mouth at bedtime. Swallow whole.    [provider]  atorvastatin  (LIPITOR) 20 MG tablet Take 1 tablet (20 mg total) by mouth daily. 02/13/24   O'NealDarryle Ned, MD  buPROPion  (WELLBUTRIN ) 75 MG tablet Take 1 tablet (75 mg total) by mouth daily. 11/09/24   Domenica Harlene LABOR, MD  Coenzyme Q10 (CO Q 10 PO) Take 1 capsule by mouth at bedtime.    [provider]  EPINEPHrine  (EPIPEN  2-PAK) 0.3 mg/0.3 mL IJ SOAJ injection Inject 0.3 mg into the muscle as needed for anaphylaxis (repeat in 15 minutes, if symptoms return seek care). 11/18/23   Domenica Harlene LABOR, MD  escitalopram  (LEXAPRO ) 10 MG tablet Take 1 tablet (10 mg total) by mouth daily. 12/13/24   Domenica Harlene LABOR, MD  famotidine  (PEPCID ) 20 MG tablet Take 20 mg by mouth daily at 12 noon.    [provider]  fluticasone  (FLONASE ) 50 MCG/ACT nasal spray Place 2 sprays into both nostrils daily. 11/25/24   Domenica Harlene LABOR, MD  furosemide  (LASIX ) 20 MG tablet Take 1 tablet (20 mg total) by mouth daily. 10/29/24   Thukkani, Arun K, MD  loratadine (CLARITIN) 10 MG tablet Take 10 mg by mouth daily as needed for  allergies.    [provider]  losartan  (COZAAR ) 25 MG tablet Take 1 tablet (25 mg total) by mouth daily. 10/18/24   Copland, Harlene BROCKS, MD  metFORMIN  (GLUCOPHAGE -XR) 500 MG 24 hr tablet Take 1 tablet (500 mg total) by mouth daily with breakfast. 11/22/24   Domenica Harlene LABOR, MD  Olopatadine HCl (PATADAY OP) Place 1 drop into both eyes daily as needed.    [provider]  Omega-3 Fatty Acids (FISH OIL) 500 MG CAPS Take 1,000 mg by mouth at  bedtime.    [provider]  pantoprazole  (PROTONIX ) 40 MG tablet Take 1 tablet (40 mg total) by mouth daily as needed. 11/12/24   Charlanne Groom, MD  Polyethylene Glycol 3350 (MIRALAX PO) Take 1 Capful by mouth daily at 12 noon.    [provider]  Propylene Glycol (SYSTANE COMPLETE PF OP) Place 1 drop into both eyes daily as needed.    [provider]  Pyridoxine HCl (VITAMIN B6 PO) Take 1 tablet by mouth daily at 12 noon. Vitmain b with folate    [provider]  tretinoin  (RETIN-A ) 0.1 % cream Apply topically at bedtime. Patient taking differently: Apply 1 Application topically at bedtime as needed. 05/20/24   Domenica Harlene LABOR, MD  triamcinolone  ointment (KENALOG ) 0.1 % Apply 1 Application topically daily as needed. Use for 2 weeks on 2 weeks off. 07/26/24   Alm Delon SAILOR, DO  ursodiol  (URSO  FORTE) 500 MG tablet Take 1 tablet (500 mg total) by mouth 3 (three) times daily. 11/12/24   Charlanne Groom, MD  Vitamin D , Cholecalciferol , 25 MCG (1000 UT) TABS Take 4,000 units daily 03/21/21   Therisa Arabia, PA-C    Family History Family History  Problem Relation Age of Onset   Breast cancer Mother 42   Cancer Mother        breast   Thyroid  disease Mother    Sleep apnea Mother    Melanoma Father    Hypertension Father    Diabetes Father    Hyperlipidemia Father    Obesity Sister    Diabetes Sister    Hypertension Sister    Pancreatitis Sister    Heart disease Maternal Grandmother        MI   Cancer  Maternal Grandfather        lung, smoker   Diabetes Maternal Grandfather    Diabetes Paternal Grandmother    Heart disease Paternal Grandmother    Colon cancer Paternal Grandfather        died at age 53   Cancer Paternal Grandfather        GI CA   Esophageal cancer Neg Hx    Stomach cancer Neg Hx    Stroke Neg Hx    Colon polyps Neg Hx    Rectal cancer Neg Hx    Pancreatic cancer Neg Hx     Social History Social History[1]   Allergies   Coconut fatty acid, Codeine, Mango flavoring agent (non-screening), [name], Oxycodone-acetaminophen , Tussionex pennkinetic er [hydrocod poli-chlorphe poli er], and Ultram [tramadol]   Review of Systems Review of Systems  Constitutional:  Negative for chills and fever.  Respiratory:  Negative for cough.   Cardiovascular:  Negative for chest pain and palpitations.  Gastrointestinal:  Negative for abdominal pain.  Musculoskeletal:  Positive for myalgias.  Skin:  Positive for color change and wound.  All other systems reviewed and are negative.    Physical Exam Triage Vital Signs ED Triage Vitals  Encounter Vitals Group     BP 12/21/24 1943 111/62     Girls Systolic BP Percentile --      Girls Diastolic BP Percentile --      Boys Systolic BP Percentile --      Boys Diastolic BP Percentile --      Pulse Rate 12/21/24 1943 (!) 107     Resp --      Temp 12/21/24 1943 98.2 F (36.8 C)     Temp Source 12/21/24 1943 Oral     SpO2  12/21/24 1943 91 %     Weight --      Height --      Head Circumference --      Peak Flow --      Pain Score 12/21/24 1959 3     Pain Loc --      Pain Education --      Exclude from Growth Chart --    No data found.  Updated Vital Signs BP 111/62 (BP Location: Right Arm)   Pulse (!) 107   Temp 98.2 F (36.8 C) (Oral)   LMP 11/22/2018   SpO2 91%   Visual Acuity Right Eye Distance:   Left Eye Distance:   Bilateral Distance:    Right Eye Near:   Left Eye Near:    Bilateral Near:      Physical Exam Vitals and nursing note reviewed.  Constitutional:      Appearance: She is well-developed and well-groomed.  Cardiovascular:     Rate and Rhythm: Tachycardia present.  Pulmonary:     Effort: Pulmonary effort is normal.     Comments: R 18 Chest:     Comments: See photo Musculoskeletal:     Comments: Right groin, see photo, hard tender area to right groin incisional wound.  Skin:    Findings: Erythema and wound present.     Comments: Wounds to right chest wall and groin area consistent with recent heart surgery/cath site:see photos, no purulent drainage noted.  Neurological:     General: No focal deficit present.     Mental Status: She is alert and oriented to person, place, and time.     GCS: GCS eye subscore is 4. GCS verbal subscore is 5. GCS motor subscore is 6.  Psychiatric:        Attention and Perception: Attention normal.        Mood and Affect: Mood normal.        Speech: Speech normal.        Behavior: Behavior normal. Behavior is cooperative.         UC Treatments / Results  Labs (all labs ordered are listed, but only abnormal results are displayed) Labs Reviewed - No data to display  EKG   Radiology No results found.  Procedures Procedures (including critical care time)  Medications Ordered in UC Medications - No data to display  Initial Impression / Assessment and Plan / UC Course  I have reviewed the triage vital signs and the nursing notes.  Pertinent labs & imaging results that were available during my care of the patient were reviewed by me and considered in my medical decision making (see chart for details).    Discussed exam findings and plan of care with patient and husband, both verbalized understanding to this provider, recommend further evaluation by on call surgeon/ER at [facility].  Patient and husband both verbalized understanding to this provider.  Ddx: Postop complication, hypoxia, tachycardia, PE, viral illness Final  Clinical Impressions(s) / UC Diagnoses   Final diagnoses:  Other postoperative complication involving circulatory system  Hypoxia  Tachycardia     Discharge Instructions      Call your surgeon or surgeon on call at [facility] Please go to ER for further evaluation of post op complications     ED Prescriptions   None    PDMP not reviewed this encounter.     [1]  Social History Tobacco Use   Smoking status: Never   Smokeless tobacco: Never  Vaping Use  Vaping status: Never Used  Substance Use Topics   Alcohol use: Yes    Alcohol/week: 0.0 standard drinks of alcohol    Comment: rare   Drug use: No     Aminta Loose, NP 12/21/24 2112  "

## 2024-12-21 NOTE — ED Notes (Signed)
 Patient is being discharged from the Urgent Care and sent to the Emergency Department via POV . Per J. Defelice NP, patient is in need of higher level of care due to need for further evaluation . Patient is aware and verbalizes understanding of plan of care.  Vitals:   12/21/24 1943  BP: 111/62  Pulse: (!) 107  Temp: 98.2 F (36.8 C)  SpO2: 91%

## 2024-12-22 ENCOUNTER — Telehealth: Payer: Self-pay | Admitting: Cardiovascular Disease

## 2024-12-22 NOTE — Telephone Encounter (Signed)
 Patient would like to know how soon Dr. Barbaraann would  like her to follow up after having heart procedure with [facility]. Please advise.

## 2024-12-22 NOTE — Telephone Encounter (Signed)
 Called and spoke to pt. She has already had the procedure at [facility] on 12/09/24. She is due for a post op visit on 12/30/24. She will be out of work for 8 weeks and is planning to return on 02/03/25.   She is doing well, no complaints.  Per Dr. Dineen note from 10/2024, pt to return in 6 mo. Advised pt to call us  back in Feb for an Early May 2026 appt.

## 2024-12-24 ENCOUNTER — Other Ambulatory Visit (HOSPITAL_BASED_OUTPATIENT_CLINIC_OR_DEPARTMENT_OTHER): Payer: Self-pay

## 2024-12-24 MED ORDER — CEPHALEXIN 500 MG PO CAPS
500.0000 mg | ORAL_CAPSULE | Freq: Two times a day (BID) | ORAL | 0 refills | Status: DC
  Filled 2024-12-24: qty 14, 7d supply, fill #0

## 2024-12-27 NOTE — Progress Notes (Signed)
 SABRA

## 2024-12-28 ENCOUNTER — Ambulatory Visit: Admitting: Adult Health

## 2024-12-28 ENCOUNTER — Telehealth (INDEPENDENT_AMBULATORY_CARE_PROVIDER_SITE_OTHER): Admitting: Adult Health

## 2024-12-28 ENCOUNTER — Other Ambulatory Visit (HOSPITAL_BASED_OUTPATIENT_CLINIC_OR_DEPARTMENT_OTHER): Payer: Self-pay

## 2024-12-28 ENCOUNTER — Other Ambulatory Visit: Payer: Self-pay

## 2024-12-28 DIAGNOSIS — G4733 Obstructive sleep apnea (adult) (pediatric): Secondary | ICD-10-CM

## 2024-12-28 NOTE — Progress Notes (Signed)
 "   PATIENT: Hailey Noble DOB: May 30, 1966  REASON FOR VISIT: follow up HISTORY FROM: patient     Virtual Visit via Video Note  I connected with Harlene LILLETTE Luis on 12/28/2024 at  2:30 PM EST by a video enabled telemedicine application located remotely at [facility] and verified that I am speaking with the correct person using two identifiers who was located at their own home in [place].   I discussed the limitations of evaluation and management by telemedicine and the availability of in person appointments. The patient expressed understanding and agreed to proceed.      HISTORY OF PRESENT ILLNESS: Today 12/28/2024:  Hailey Noble is a 59 y.o. female with a history of OSA on CPAP. Returns today for follow-up.  She reports overall she is doing well.  She did have heart surgery recently and is recovering.  She does not use a fullface mask and does feel it leaking.  Her last sleep study was in 2021.  Her download is below       REVIEW OF SYSTEMS: Out of a complete 14 system review of symptoms, the patient complains only of the following symptoms, and all other reviewed systems are negative.  ALLERGIES: Allergies[1]  HOME MEDICATIONS: Outpatient Medications Prior to Visit  Medication Sig Dispense Refill   acetaminophen  (TYLENOL ) 500 MG tablet Take 500 mg by mouth every 6 (six) hours as needed for headache.     aspirin EC 81 MG tablet Take 81 mg by mouth at bedtime. Swallow whole.     atorvastatin  (LIPITOR) 20 MG tablet Take 1 tablet (20 mg total) by mouth daily. 90 tablet 3   buPROPion  (WELLBUTRIN ) 75 MG tablet Take 1 tablet (75 mg total) by mouth daily. 90 tablet 1   cephALEXin  (KEFLEX ) 500 MG capsule Take 1 capsule (500 mg total) by mouth 2 (two) times daily for 7 days 14 capsule 0   Coenzyme Q10 (CO Q 10 PO) Take 1 capsule by mouth at bedtime.     EPINEPHrine  (EPIPEN  2-PAK) 0.3 mg/0.3 mL IJ SOAJ injection Inject 0.3 mg into the muscle as needed for anaphylaxis  (repeat in 15 minutes, if symptoms return seek care). 2 each 1   escitalopram  (LEXAPRO ) 10 MG tablet Take 1 tablet (10 mg total) by mouth daily. 90 tablet 1   famotidine  (PEPCID ) 20 MG tablet Take 20 mg by mouth daily at 12 noon.     fluticasone  (FLONASE ) 50 MCG/ACT nasal spray Place 2 sprays into both nostrils daily. 16 g 1   furosemide  (LASIX ) 20 MG tablet Take 1 tablet (20 mg total) by mouth daily. 30 tablet 1   loratadine (CLARITIN) 10 MG tablet Take 10 mg by mouth daily as needed for allergies.     losartan  (COZAAR ) 25 MG tablet Take 1 tablet (25 mg total) by mouth daily. 30 tablet 3   metFORMIN  (GLUCOPHAGE -XR) 500 MG 24 hr tablet Take 1 tablet (500 mg total) by mouth daily with breakfast. 90 tablet 1   Olopatadine HCl (PATADAY OP) Place 1 drop into both eyes daily as needed.     Omega-3 Fatty Acids (FISH OIL) 500 MG CAPS Take 1,000 mg by mouth at bedtime.     pantoprazole  (PROTONIX ) 40 MG tablet Take 1 tablet (40 mg total) by mouth daily as needed. 90 tablet 4   Polyethylene Glycol 3350 (MIRALAX PO) Take 1 Capful by mouth daily at 12 noon.     Propylene Glycol (SYSTANE COMPLETE PF OP) Place 1  drop into both eyes daily as needed.     Pyridoxine HCl (VITAMIN B6 PO) Take 1 tablet by mouth daily at 12 noon. Vitmain b with folate     tretinoin  (RETIN-A ) 0.1 % cream Apply topically at bedtime. (Patient taking differently: Apply 1 Application topically at bedtime as needed.) 45 g 1   triamcinolone  ointment (KENALOG ) 0.1 % Apply 1 Application topically daily as needed. Use for 2 weeks on 2 weeks off. 454 g 2   ursodiol  (URSO  FORTE) 500 MG tablet Take 1 tablet (500 mg total) by mouth 3 (three) times daily. 270 tablet 4   Vitamin D , Cholecalciferol , 25 MCG (1000 UT) TABS Take 4,000 units daily 60 tablet    No facility-administered medications prior to visit.    PAST MEDICAL HISTORY: Past Medical History:  Diagnosis Date   Allergic state 12/08/2016   Allergy    Anxiety    Back pain     i  just have a weak loer back    Fibroids    GERD (gastroesophageal reflux disease)    Hyperlipidemia 06/09/2017   Hypoxia 12/21/2024   Insulin  resistance    Joint pain    Knee pain, right 03/10/2017   Plantar fasciitis    PONV (postoperative nausea and vomiting)    just nausea    Shortness of breath    not only if  i exert myselg    Sinusitis    Sleep apnea    Sleep apnea in adult 12/26/2020   Vitamin D  deficiency 11/22/2016   Vitamin D  deficiency     PAST SURGICAL HISTORY: Past Surgical History:  Procedure Laterality Date   COLONOSCOPY  02/2009   with Abran normal   CYSTOSCOPY N/A 10/12/2019   Procedure: CYSTOSCOPY;  Surgeon: Delana Ted Morrison, DO;  Location: [facility];  Service: Gynecology;  Laterality: N/A;   ESOPHAGOGASTRODUODENOSCOPY  2012   GANGLION CYST EXCISION Right    right- wrist    NASAL SEPTUM SURGERY     NASAL SINUS SURGERY     RIGHT/LEFT HEART CATH AND CORONARY ANGIOGRAPHY N/A 10/29/2024   Procedure: RIGHT/LEFT HEART CATH AND CORONARY ANGIOGRAPHY;  Surgeon: Wendel Lurena POUR, MD;  [facility];  Service: Cardiovascular;  Laterality: N/A;   TMJ ARTHROSCOPY     left   TOTAL ABDOMINAL HYSTERECTOMY  09/2019   TOTAL LAPAROSCOPIC HYSTERECTOMY WITH BILATERAL SALPINGO OOPHORECTOMY Bilateral 10/12/2019   Procedure: TOTAL LAPAROSCOPIC HYSTERECTOMY WITH BILATERAL SALPINGO OOPHORECTOMY, Lysis Pelvic Adhseions ;  Surgeon: Delana Ted Morrison, DO;  Location: [facility];  Service: Gynecology;  Laterality: Bilateral;   TRANSESOPHAGEAL ECHOCARDIOGRAM (CATH LAB) N/A 10/29/2024   Procedure: TRANSESOPHAGEAL ECHOCARDIOGRAM;  Surgeon: Loni Soyla LABOR, MD;  [facility];  Service: Cardiovascular;  Laterality: N/A;    FAMILY HISTORY: Family History  Problem Relation Age of Onset   Breast cancer Mother 19   Cancer Mother        breast   Thyroid  disease Mother    Sleep apnea Mother    Melanoma Father     Hypertension Father    Diabetes Father    Hyperlipidemia Father    Obesity Sister    Diabetes Sister    Hypertension Sister    Pancreatitis Sister    Heart disease Maternal Grandmother        MI   Cancer Maternal Grandfather        lung, smoker   Diabetes Maternal Grandfather    Diabetes Paternal Grandmother  Heart disease Paternal Grandmother    Colon cancer Paternal Grandfather        died at age 81   Cancer Paternal Grandfather        GI CA   Esophageal cancer Neg Hx    Stomach cancer Neg Hx    Stroke Neg Hx    Colon polyps Neg Hx    Rectal cancer Neg Hx    Pancreatic cancer Neg Hx     SOCIAL HISTORY: Social History   Socioeconomic History   Marital status: Married    Spouse name: Alm   Number of children: Not on file   Years of education: Not on file   Highest education level: Bachelor's degree (e.g., BA, AB, BS)  Occupational History   Occupation: Teacher, Adult Education: [employer]  Tobacco Use   Smoking status: Never   Smokeless tobacco: Never  Vaping Use   Vaping status: Never Used  Substance and Sexual Activity   Alcohol use: Yes    Alcohol/week: 0.0 standard drinks of alcohol    Comment: rare   Drug use: No   Sexual activity: Yes    Birth control/protection: I.U.D.  Other Topics Concern   Not on file  Social History Narrative   Originally from [place], spent 7 years as a traveling [employer] [phone]). Works at [employer] center. No dietary restrictions. Lives with dog      Right Handed   1 cup of Coffee per Day   Social Drivers of Health   Tobacco Use: Low Risk (12/21/2024)   Patient History    Smoking Tobacco Use: Never    Smokeless Tobacco Use: Never    Passive Exposure: Not on file  Financial Resource Strain: Low Risk  (12/09/2024)   Received from [facility]   Overall Financial Resource Strain (CARDIA)    Difficulty of Paying Living Expenses: Not hard at all  Food Insecurity: No Food Insecurity (12/09/2024)   Received from  [facility]   Epic    Within the past 12 months, you worried that your food would run out before you got the money to buy more.: Never true    Within the past 12 months, the food you bought just didn't last and you didn't have money to get more.: Never true  Transportation Needs: No Transportation Needs (12/09/2024)   [facility]    In the past 12 months, has lack of transportation kept you from medical appointments or from getting medications?: No    Lack of Transportation (Non-Medical): No  Physical Activity: Insufficiently Active (11/02/2024)   Exercise Vital Sign    Days of Exercise per Week: 4 days    Minutes of Exercise per Session: 30 min  Stress: Stress Concern Present (11/02/2024)   [employer] of Occupational Health - Occupational Stress Questionnaire    Feeling of Stress: To some extent  Social Connections: Moderately Isolated (11/02/2024)   Social Connection and Isolation Panel    Frequency of Communication with Friends and Family: More than three times a week    Frequency of Social Gatherings with Friends and Family: Once a week    Attends Religious Services: Never    Database Administrator or Organizations: No    Attends Banker Meetings: Not on file    Marital Status: Married  Intimate Partner Violence: Not on file  Depression (PHQ2-9): Low Risk (12/01/2024)   Depression (PHQ2-9)  PHQ-2 Score: 2  Alcohol Screen: Not on file  Housing: Low Risk  (12/09/2024)   [facility] from [facility]   Epic    In the last 12 months, was there a time when you were not able to pay the mortgage or rent on time?: No    In the past 12 months, how many times have you moved where you were living?: 0    At any time in the past 12 months, were you homeless or living in a shelter (including now)?: No  Utilities: Not At Risk (12/08/2024)   Received from [facility]   Epic    In the past 12 months has the electric, gas, oil, or water company threatened to shut off services in your home?: No  Health Literacy: Not on file      PHYSICAL EXAM  Generalized: Well developed, in no acute distress  Chest: Lungs clear to auscultation bilaterally  Neurological examination  Mentation: Alert oriented to time, place, history taking. Follows all commands speech and language fluent Cranial nerve II-XII: Facial symmetry noted   DIAGNOSTIC DATA (LABS, IMAGING, TESTING) - I reviewed patient records, labs, notes, testing and imaging myself where available.  Lab Results  Component Value Date   WBC 10.0 12/02/2024   HGB 12.5 12/02/2024   HCT 37.8 12/02/2024   MCV 87.2 12/02/2024   PLT 307.0 12/02/2024      Component Value Date/Time   NA 140 12/02/2024 0804   NA 139 01/29/2024 0836   K 3.9 12/02/2024 0804   CL 104 12/02/2024 0804   CO2 29 12/02/2024 0804   GLUCOSE 90 12/02/2024 0804   GLUCOSE 86 11/13/2009 0000   BUN 17 12/02/2024 0804   BUN 13 01/29/2024 0836   CREATININE 0.59 12/02/2024 0804   CREATININE 0.67 11/22/2016 1521   CALCIUM  9.1 12/02/2024 0804   PROT 6.6 12/02/2024 0804   PROT 6.8 01/24/2021 0843   ALBUMIN 4.2 12/02/2024 0804   ALBUMIN 4.3 01/24/2021 0843   AST 16 12/02/2024 0804   ALT 17 12/02/2024 0804   ALKPHOS 151 (H) 12/02/2024 0804   BILITOT 0.3 12/02/2024 0804   BILITOT 0.2 01/24/2021 0843   GFRNONAA >60 10/18/2024 1715   GFRAA 124 01/24/2021 0843   Lab Results  Component Value Date   CHOL 95 12/02/2024   HDL 41.30 12/02/2024   LDLCALC 26 12/02/2024   LDLDIRECT 98.0 03/05/2023   TRIG 134.0 12/02/2024   CHOLHDL 2 12/02/2024   Lab Results  Component Value Date   HGBA1C 5.7 12/02/2024   Lab Results  Component Value Date   VITAMINB12 678 10/06/2017   Lab Results  Component Value Date   TSH 2.26 12/02/2024      ASSESSMENT AND PLAN 59 y.o. year old female  has a past medical history of Allergic state  (12/08/2016), Allergy, Anxiety, Back pain, Fibroids, GERD (gastroesophageal reflux disease), Hyperlipidemia (06/09/2017), Hypoxia (12/21/2024), Insulin  resistance, Joint pain, Knee pain, right (03/10/2017), Plantar fasciitis, PONV (postoperative nausea and vomiting), Shortness of breath, Sinusitis, Sleep apnea, Sleep apnea in adult (12/26/2020), Vitamin D  deficiency (11/22/2016), and Vitamin D  deficiency. here with:  OSA on CPAP  - CPAP compliance excellent - Good treatment of AHI  - Encourage patient to use CPAP nightly and > 4 hours each night -Mask refitting ordered - F/U in 1 year or sooner if needed  Orders Placed This Encounter  Procedures   For home use only DME continuous positive airway pressure (CPAP)  Mask refitting    Length of Need:   12 Months    Patient has OSA or probable OSA:   Yes    Is the patient currently using CPAP in the home:   Yes    Settings:   Other see comments    CPAP supplies needed:   Mask, headgear, cushions, filters, heated tubing and water chamber      Duwaine Russell, MSN, NP-C 12/28/2024, 2:16 PM [facility] [address] [place], [place] [address] [phone]  The patient's condition requires frequent monitoring and adjustments in the treatment plan, reflecting the ongoing complexity of care.  This provider is the continuing focal point for all needed services for this condition.     [1]  Allergies Allergen Reactions   Coconut Fatty Acid Hives   Codeine Hives and Itching   Mango Flavoring Agent (Non-Screening) Hives   [name] Itching and Swelling   Oxycodone-Acetaminophen      rash on face and head, itching   Tussionex Pennkinetic Er [Hydrocod Poli-Chlorphe Poli Er]     rash on face and head, itching   Ultram [Tramadol] Other (See Comments)    Dizzy and low blood pressure.    "

## 2024-12-29 NOTE — Progress Notes (Signed)
 Hailey Noble

## 2024-12-30 ENCOUNTER — Telehealth: Payer: Self-pay | Admitting: Cardiovascular Disease

## 2024-12-30 ENCOUNTER — Telehealth (HOSPITAL_COMMUNITY): Payer: Self-pay

## 2024-12-30 NOTE — Telephone Encounter (Signed)
 Left message for pt to call back

## 2024-12-30 NOTE — Telephone Encounter (Signed)
 Left message for patient to call back

## 2024-12-30 NOTE — Telephone Encounter (Signed)
 Patient called to see when she would be schedule for Cardiac Rehab. She stated that she was interested. Advised patient that we received outside referral from Dr Gaca. She stated that Dr Burton was her Cardiologist.  Advised that she would need to get F/U scheduled with him ASAP.  Once she has been cleared by nurse navigator we would be calling her to schedule. Sent request to [facility] for recent 12 lead EKG

## 2024-12-30 NOTE — Telephone Encounter (Signed)
 Dr Ricky sent in referral for rehab however they are telling her to have a follow up appointment with us  first. Pt was seen By Dr Burton 10/25/24 with 6 month follow up. Would you like her to come in first or send referral?

## 2024-12-30 NOTE — Telephone Encounter (Signed)
 Patient calling in to be clear for rehab. Please advise

## 2025-01-03 NOTE — Progress Notes (Signed)
 "  Acute Office Visit  Subjective:  Patient ID: Hailey Noble, female    DOB: [birth year]  Age: 59 y.o. MRN: [id number]  CC: No chief complaint on file.     HPI WAYNETTE Noble is here for ***.   Past Medical History:  Diagnosis Date   Allergic state 12/08/2016   Allergy    Anxiety    Back pain     i just have a weak loer back    Fibroids    GERD (gastroesophageal reflux disease)    Hyperlipidemia 06/09/2017   Hypoxia 12/21/2024   Insulin  resistance    Joint pain    Knee pain, right 03/10/2017   Plantar fasciitis    PONV (postoperative nausea and vomiting)    just nausea    Shortness of breath    not only if  i exert myselg    Sinusitis    Sleep apnea    Sleep apnea in adult 12/26/2020   Vitamin D  deficiency 11/22/2016   Vitamin D  deficiency     Past Surgical History:  Procedure Laterality Date   COLONOSCOPY  02/2009   with Abran normal   CYSTOSCOPY N/A 10/12/2019   Procedure: CYSTOSCOPY;  Surgeon: Delana Ted Morrison, DO;  Location: [facility];  Service: Gynecology;  Laterality: N/A;   ESOPHAGOGASTRODUODENOSCOPY  2012   GANGLION CYST EXCISION Right    right- wrist    NASAL SEPTUM SURGERY     NASAL SINUS SURGERY     RIGHT/LEFT HEART CATH AND CORONARY ANGIOGRAPHY N/A 10/29/2024   Procedure: RIGHT/LEFT HEART CATH AND CORONARY ANGIOGRAPHY;  Surgeon: Wendel Lurena POUR, MD;  [facility];  Service: Cardiovascular;  Laterality: N/A;   TMJ ARTHROSCOPY     left   TOTAL ABDOMINAL HYSTERECTOMY  09/2019   TOTAL LAPAROSCOPIC HYSTERECTOMY WITH BILATERAL SALPINGO OOPHORECTOMY Bilateral 10/12/2019   Procedure: TOTAL LAPAROSCOPIC HYSTERECTOMY WITH BILATERAL SALPINGO OOPHORECTOMY, Lysis Pelvic Adhseions ;  Surgeon: Delana Ted Morrison, DO;  Location: [facility];  Service: Gynecology;  Laterality: Bilateral;   TRANSESOPHAGEAL ECHOCARDIOGRAM (CATH LAB) N/A 10/29/2024   Procedure: TRANSESOPHAGEAL ECHOCARDIOGRAM;  Surgeon: Loni Soyla LABOR, MD;  [facility];  Service: Cardiovascular;  Laterality: N/A;    Family History  Problem Relation Age of Onset   Breast cancer Mother 20   Cancer Mother        breast   Thyroid  disease Mother    Sleep apnea Mother    Melanoma Father    Hypertension Father    Diabetes Father    Hyperlipidemia Father    Obesity Sister    Diabetes Sister    Hypertension Sister    Pancreatitis Sister    Heart disease Maternal Grandmother        MI   Cancer Maternal Grandfather        lung, smoker   Diabetes Maternal Grandfather    Diabetes Paternal Grandmother    Heart disease Paternal Grandmother    Colon cancer Paternal Grandfather        died at age 86   Cancer Paternal Grandfather        GI CA   Esophageal cancer Neg Hx    Stomach cancer Neg Hx    Stroke Neg Hx    Colon polyps Neg Hx    Rectal cancer Neg Hx    Pancreatic cancer Neg Hx     Social History   Socioeconomic History   Marital status: Married  Spouse name: Alm   Number of children: Not on file   Years of education: Not on file   Highest education level: Bachelor's degree (e.g., BA, AB, BS)  Occupational History   Occupation: Teacher, Adult Education: [place]  Tobacco Use   Smoking status: Never   Smokeless tobacco: Never  Vaping Use   Vaping status: Never Used  Substance and Sexual Activity   Alcohol use: Yes    Alcohol/week: 0.0 standard drinks of alcohol    Comment: rare   Drug use: No   Sexual activity: Yes    Birth control/protection: I.U.D.  Other Topics Concern   Not on file  Social History Narrative   Originally from [place], spent 7 years as a traveling [employer] [phone]). Works at [employer] center. No dietary restrictions. Lives with dog      Right Handed   1 cup of Coffee per Day   Social Drivers of Health   Tobacco Use: Low Risk  (12/30/2024)   Received from [facility]   Patient History    Smoking Tobacco Use: Never    Smokeless Tobacco Use: Never     Passive Exposure: Never  Financial Resource Strain: Low Risk  (12/09/2024)   Received from [facility]   Overall Financial Resource Strain (CARDIA)    Difficulty of Paying Living Expenses: Not hard at all  Food Insecurity: No Food Insecurity (12/09/2024)   Received from [facility]   Epic    Within the past 12 months, you worried that your food would run out before you got the money to buy more.: Never true    Within the past 12 months, the food you bought just didn't last and you didn't have money to get more.: Never true  Transportation Needs: No Transportation Needs (12/09/2024)   Received [facility]    In the past 12 months, has lack of transportation kept you from medical appointments or from getting medications?: No    Lack of Transportation (Non-Medical): No  Physical Activity: Insufficiently Active (11/02/2024)   Exercise Vital Sign    Days of Exercise per Week: 4 days    Minutes of Exercise per Session: 30 min  Stress: Stress Concern Present (11/02/2024)   [employer] of Occupational Health - Occupational Stress Questionnaire    Feeling of Stress: To some extent  Social Connections: Moderately Isolated (11/02/2024)   Social Connection and Isolation Panel    Frequency of Communication with Friends and Family: More than three times a week    Frequency of Social Gatherings with Friends and Family: Once a week    Attends Religious Services: Never    Database Administrator or Organizations: No    Attends Engineer, Structural: Not on file    Marital Status: Married  Catering Manager Violence: Not on file  Depression (PHQ2-9): Low Risk (12/01/2024)   Depression (PHQ2-9)    PHQ-2 Score: 2  Alcohol Screen: Not on file  Housing: Low Risk  (12/09/2024)   Received from [facility]   Epic    In the last 12 months, was there a time when you were not able to pay the  mortgage or rent on time?: No    In the past 12 months, how many times have you moved where you were living?: 0    At any time in the past 12 months, were you homeless or living  in a shelter (including now)?: No  Utilities: Not At Risk (12/08/2024)   Received from [facility]   Epic    In the past 12 months has the electric, gas, oil, or water company threatened to shut off services in your home?: No  Health Literacy: Not on file    ROS All ROS negative except what is listed in the HPI.   Objective:   Today's Vitals: LMP 11/22/2018   Physical Exam  Assessment & Plan:   Problem List Items Addressed This Visit   None     Follow-up: No follow-ups on file.   Waddell FURY Almarie, DNP, FNP-C  I,Emily Lagle,acting as a neurosurgeon for Waddell KATHEE Almarie, NP.,have documented all relevant documentation on the behalf of Waddell KATHEE Almarie, NP.   I, Waddell KATHEE Almarie, NP, have reviewed all documentation for this visit. The documentation on 01/04/2025 for the exam, diagnosis, procedures, and orders are all accurate and complete. "

## 2025-01-03 NOTE — Addendum Note (Signed)
 Addended by: LORRENE FEDERICO CROME on: 01/03/2025 12:08 PM   Modules accepted: Orders

## 2025-01-04 ENCOUNTER — Other Ambulatory Visit (HOSPITAL_BASED_OUTPATIENT_CLINIC_OR_DEPARTMENT_OTHER): Payer: Self-pay

## 2025-01-04 ENCOUNTER — Encounter: Payer: Self-pay | Admitting: Family Medicine

## 2025-01-04 ENCOUNTER — Ambulatory Visit: Admitting: Family Medicine

## 2025-01-04 VITALS — BP 127/56 | HR 110 | Temp 98.4°F | Resp 16 | Ht 65.0 in | Wt 249.4 lb

## 2025-01-04 DIAGNOSIS — J988 Other specified respiratory disorders: Secondary | ICD-10-CM

## 2025-01-04 DIAGNOSIS — R051 Acute cough: Secondary | ICD-10-CM

## 2025-01-04 LAB — POC COVID19 BINAXNOW: SARS Coronavirus 2 Ag: NEGATIVE

## 2025-01-04 MED ORDER — FLUCONAZOLE 150 MG PO TABS
150.0000 mg | ORAL_TABLET | Freq: Every day | ORAL | 0 refills | Status: DC
  Filled 2025-01-04: qty 2, 3d supply, fill #0

## 2025-01-04 MED ORDER — PROMETHAZINE-DM 6.25-15 MG/5ML PO SYRP
5.0000 mL | ORAL_SOLUTION | Freq: Two times a day (BID) | ORAL | 0 refills | Status: AC | PRN
  Filled 2025-01-04: qty 50, 5d supply, fill #0

## 2025-01-04 MED ORDER — AMOXICILLIN-POT CLAVULANATE 875-125 MG PO TABS
1.0000 | ORAL_TABLET | Freq: Two times a day (BID) | ORAL | 0 refills | Status: AC
  Filled 2025-01-04: qty 14, 7d supply, fill #0

## 2025-01-19 ENCOUNTER — Ambulatory Visit: Admitting: Nurse Practitioner

## 2025-01-24 ENCOUNTER — Other Ambulatory Visit (HOSPITAL_BASED_OUTPATIENT_CLINIC_OR_DEPARTMENT_OTHER): Payer: Self-pay

## 2025-01-24 ENCOUNTER — Other Ambulatory Visit: Payer: Self-pay | Admitting: Family Medicine

## 2025-01-24 MED ORDER — FLUTICASONE PROPIONATE 50 MCG/ACT NA SUSP: 2.0000 | Freq: Every day | NASAL | 1 refills | Status: AC

## 2025-01-25 ENCOUNTER — Encounter: Payer: Self-pay | Admitting: Student

## 2025-01-25 DIAGNOSIS — K743 Primary biliary cirrhosis: Secondary | ICD-10-CM | POA: Insufficient documentation

## 2025-01-25 NOTE — Assessment & Plan Note (Signed)
 Stable on current medications. Denies SI/HI.

## 2025-01-25 NOTE — Assessment & Plan Note (Signed)
 Hx of elevated alkaline phose. Following with GI. Managed with ursodiol  500 mg TID.

## 2025-01-26 ENCOUNTER — Other Ambulatory Visit (HOSPITAL_BASED_OUTPATIENT_CLINIC_OR_DEPARTMENT_OTHER): Payer: Self-pay

## 2025-01-26 ENCOUNTER — Ambulatory Visit: Admitting: Student

## 2025-01-26 ENCOUNTER — Encounter: Payer: Self-pay | Admitting: Student

## 2025-01-26 VITALS — BP 118/80 | HR 114 | Temp 98.0°F | Resp 16 | Ht 65.0 in | Wt 253.0 lb

## 2025-01-26 DIAGNOSIS — T8131XA Disruption of external operation (surgical) wound, not elsewhere classified, initial encounter: Secondary | ICD-10-CM | POA: Insufficient documentation

## 2025-01-26 DIAGNOSIS — Z9889 Other specified postprocedural states: Secondary | ICD-10-CM

## 2025-01-26 DIAGNOSIS — K219 Gastro-esophageal reflux disease without esophagitis: Secondary | ICD-10-CM

## 2025-01-26 DIAGNOSIS — E7849 Other hyperlipidemia: Secondary | ICD-10-CM

## 2025-01-26 DIAGNOSIS — K743 Primary biliary cirrhosis: Secondary | ICD-10-CM

## 2025-01-26 DIAGNOSIS — F418 Other specified anxiety disorders: Secondary | ICD-10-CM

## 2025-01-26 DIAGNOSIS — L308 Other specified dermatitis: Secondary | ICD-10-CM | POA: Insufficient documentation

## 2025-01-26 DIAGNOSIS — R000 Tachycardia, unspecified: Secondary | ICD-10-CM

## 2025-01-26 LAB — BASIC METABOLIC PANEL WITH GFR
BUN: 14 mg/dL (ref 6–23)
CO2: 27 meq/L (ref 19–32)
Calcium: 9.6 mg/dL (ref 8.4–10.5)
Chloride: 104 meq/L (ref 96–112)
Creatinine, Ser: 0.57 mg/dL (ref 0.40–1.20)
GFR: 99.25 mL/min
Glucose, Bld: 82 mg/dL (ref 70–99)
Potassium: 4.5 meq/L (ref 3.5–5.1)
Sodium: 140 meq/L (ref 135–145)

## 2025-01-26 LAB — CBC
HCT: 35 % — ABNORMAL LOW (ref 36.0–46.0)
Hemoglobin: 11.6 g/dL — ABNORMAL LOW (ref 12.0–15.0)
MCHC: 33.1 g/dL (ref 30.0–36.0)
MCV: 86.2 fl (ref 78.0–100.0)
Platelets: 461 10*3/uL — ABNORMAL HIGH (ref 150.0–400.0)
RBC: 4.06 Mil/uL (ref 3.87–5.11)
RDW: 15.8 % — ABNORMAL HIGH (ref 11.5–15.5)
WBC: 8.7 10*3/uL (ref 4.0–10.5)

## 2025-01-26 MED ORDER — NYSTATIN-TRIAMCINOLONE 100000-0.1 UNIT/GM-% EX OINT
1.0000 | TOPICAL_OINTMENT | Freq: Two times a day (BID) | CUTANEOUS | 0 refills | Status: AC
  Filled 2025-01-26: qty 30, 15d supply, fill #0

## 2025-01-26 MED ORDER — MUPIROCIN 2 % EX OINT
1.0000 | TOPICAL_OINTMENT | Freq: Two times a day (BID) | CUTANEOUS | 1 refills | Status: AC
  Filled 2025-01-26: qty 22, 11d supply, fill #0

## 2025-01-26 MED ORDER — METOPROLOL SUCCINATE ER 25 MG PO TB24
25.0000 mg | ORAL_TABLET | Freq: Every day | ORAL | 1 refills | Status: AC
  Filled 2025-01-26: qty 90, 90d supply, fill #0

## 2025-01-26 NOTE — Assessment & Plan Note (Signed)
 Persistent sinus tachycardia with heart rate above >100 bpm. Awaiting cardiology follow-up. - Rx- start metoprolol  25 mg once daily. - Monitor blood pressure and heart rate.

## 2025-01-26 NOTE — Assessment & Plan Note (Addendum)
 Surgical incision beneath a pendulous breast, surrounding erythema likely r/t moisture.  Advised to keep the area open to air and to pat dry thoroughly after cleansing and showering. Apply Mycolog ointment to areas of erythema and moisture adjacent to the surgical site - Keep open to air.

## 2025-01-26 NOTE — Assessment & Plan Note (Addendum)
 Mild, superficial wound dehiscence on surgical incision noted with serosanguinous drainage. No evidence of deep tissue exposure or cellulitis. -Instructed to cleanse area with antibacterial soap and water and pat dry. Apply mupirocin  to the wound open areas,  leave open to air.  Monitor for infection signs: fever, spreading redness, heat, purulent drainage and seek immediate care if this occurs

## 2025-01-27 ENCOUNTER — Ambulatory Visit: Payer: Self-pay | Admitting: Student

## 2025-02-02 ENCOUNTER — Ambulatory Visit: Admitting: Student

## 2025-02-10 ENCOUNTER — Ambulatory Visit: Admitting: Emergency Medicine

## 2025-06-02 ENCOUNTER — Ambulatory Visit: Admitting: Family Medicine

## 2025-06-16 ENCOUNTER — Ambulatory Visit: Admitting: Family Medicine

## 2025-06-16 ENCOUNTER — Ambulatory Visit: Admitting: Student

## 2025-07-27 ENCOUNTER — Ambulatory Visit: Admitting: Dermatology

## 2025-12-29 ENCOUNTER — Telehealth: Admitting: Adult Health
